# Patient Record
Sex: Female | Born: 1935 | Race: White | Hispanic: No | Smoking: Never smoker
Health system: Southern US, Community
[De-identification: ages and names within clinical notes are randomized; demographics above are authoritative.]

## PROBLEM LIST (undated history)

## (undated) DIAGNOSIS — I10 Essential (primary) hypertension: Secondary | ICD-10-CM

## (undated) DIAGNOSIS — K219 Gastro-esophageal reflux disease without esophagitis: Secondary | ICD-10-CM

## (undated) DIAGNOSIS — E119 Type 2 diabetes mellitus without complications: Secondary | ICD-10-CM

## (undated) DIAGNOSIS — J309 Allergic rhinitis, unspecified: Secondary | ICD-10-CM

## (undated) DIAGNOSIS — G4730 Sleep apnea, unspecified: Secondary | ICD-10-CM

## (undated) HISTORY — PX: FIXATION KYPHOPLASTY LUMBAR SPINE: SHX1642

## (undated) HISTORY — PX: TUBAL LIGATION: SHX77

## (undated) HISTORY — PX: HIATAL HERNIA REPAIR: SHX195

## (undated) HISTORY — DX: Gastro-esophageal reflux disease without esophagitis: K21.9

## (undated) HISTORY — PX: VAGINAL HYSTERECTOMY: SHX2639

## (undated) HISTORY — DX: Allergic rhinitis, unspecified: J30.9

## (undated) HISTORY — DX: Type 2 diabetes mellitus without complications: E11.9

## (undated) HISTORY — DX: Sleep apnea, unspecified: G47.30

## (undated) HISTORY — DX: Essential (primary) hypertension: I10

## (undated) HISTORY — PX: LUMBAR SPINE SURGERY: SHX701

## (undated) HISTORY — PX: APPENDECTOMY: SHX54

---

## 1998-07-05 ENCOUNTER — Ambulatory Visit (HOSPITAL_COMMUNITY): Admission: RE | Admit: 1998-07-05 | Discharge: 1998-07-05 | Payer: Self-pay | Admitting: Internal Medicine

## 1998-07-05 ENCOUNTER — Encounter: Payer: Self-pay | Admitting: Internal Medicine

## 1998-09-14 ENCOUNTER — Ambulatory Visit (HOSPITAL_COMMUNITY): Admission: RE | Admit: 1998-09-14 | Discharge: 1998-09-14 | Payer: Self-pay | Admitting: Gastroenterology

## 1998-12-12 ENCOUNTER — Other Ambulatory Visit: Admission: RE | Admit: 1998-12-12 | Discharge: 1998-12-12 | Payer: Self-pay | Admitting: *Deleted

## 1999-03-16 ENCOUNTER — Encounter: Payer: Self-pay | Admitting: Family Medicine

## 1999-03-16 ENCOUNTER — Encounter: Admission: RE | Admit: 1999-03-16 | Discharge: 1999-03-16 | Payer: Self-pay | Admitting: Family Medicine

## 1999-04-04 ENCOUNTER — Encounter: Admission: RE | Admit: 1999-04-04 | Discharge: 1999-04-04 | Payer: Self-pay | Admitting: Neurosurgery

## 1999-04-04 ENCOUNTER — Encounter: Payer: Self-pay | Admitting: Neurosurgery

## 1999-04-10 ENCOUNTER — Encounter: Admission: RE | Admit: 1999-04-10 | Discharge: 1999-04-10 | Payer: Self-pay | Admitting: Neurosurgery

## 1999-04-10 ENCOUNTER — Encounter: Payer: Self-pay | Admitting: Neurosurgery

## 1999-04-21 ENCOUNTER — Encounter: Payer: Self-pay | Admitting: Neurosurgery

## 1999-04-25 ENCOUNTER — Encounter: Payer: Self-pay | Admitting: Neurosurgery

## 1999-04-25 ENCOUNTER — Inpatient Hospital Stay (HOSPITAL_COMMUNITY): Admission: RE | Admit: 1999-04-25 | Discharge: 1999-04-29 | Payer: Self-pay | Admitting: Neurosurgery

## 1999-05-07 ENCOUNTER — Emergency Department (HOSPITAL_COMMUNITY): Admission: EM | Admit: 1999-05-07 | Discharge: 1999-05-07 | Payer: Self-pay | Admitting: Emergency Medicine

## 1999-05-17 ENCOUNTER — Encounter: Admission: RE | Admit: 1999-05-17 | Discharge: 1999-05-17 | Payer: Self-pay | Admitting: Neurosurgery

## 1999-05-17 ENCOUNTER — Encounter: Payer: Self-pay | Admitting: Neurosurgery

## 1999-07-31 ENCOUNTER — Encounter: Payer: Self-pay | Admitting: Neurosurgery

## 1999-07-31 ENCOUNTER — Encounter: Admission: RE | Admit: 1999-07-31 | Discharge: 1999-07-31 | Payer: Self-pay | Admitting: Neurosurgery

## 1999-11-07 ENCOUNTER — Encounter: Payer: Self-pay | Admitting: *Deleted

## 1999-11-07 ENCOUNTER — Encounter: Admission: RE | Admit: 1999-11-07 | Discharge: 1999-11-07 | Payer: Self-pay | Admitting: *Deleted

## 1999-11-07 ENCOUNTER — Other Ambulatory Visit: Admission: RE | Admit: 1999-11-07 | Discharge: 1999-11-07 | Payer: Self-pay | Admitting: *Deleted

## 2000-02-29 ENCOUNTER — Encounter: Payer: Self-pay | Admitting: Neurosurgery

## 2000-02-29 ENCOUNTER — Encounter: Admission: RE | Admit: 2000-02-29 | Discharge: 2000-02-29 | Payer: Self-pay | Admitting: Neurosurgery

## 2000-04-15 ENCOUNTER — Encounter: Admission: RE | Admit: 2000-04-15 | Discharge: 2000-04-15 | Payer: Self-pay | Admitting: *Deleted

## 2000-04-15 ENCOUNTER — Encounter: Payer: Self-pay | Admitting: *Deleted

## 2000-10-24 ENCOUNTER — Encounter: Admission: RE | Admit: 2000-10-24 | Discharge: 2000-10-24 | Payer: Self-pay | Admitting: *Deleted

## 2000-10-24 ENCOUNTER — Encounter: Payer: Self-pay | Admitting: *Deleted

## 2001-02-19 ENCOUNTER — Encounter: Admission: RE | Admit: 2001-02-19 | Discharge: 2001-05-20 | Payer: Self-pay | Admitting: Family Medicine

## 2001-08-27 ENCOUNTER — Encounter: Admission: RE | Admit: 2001-08-27 | Discharge: 2001-08-27 | Payer: Self-pay | Admitting: Family Medicine

## 2001-08-27 ENCOUNTER — Encounter: Payer: Self-pay | Admitting: Family Medicine

## 2001-10-23 ENCOUNTER — Ambulatory Visit (HOSPITAL_BASED_OUTPATIENT_CLINIC_OR_DEPARTMENT_OTHER): Admission: RE | Admit: 2001-10-23 | Discharge: 2001-10-23 | Payer: Self-pay | Admitting: Orthopedic Surgery

## 2001-12-17 ENCOUNTER — Encounter: Admission: RE | Admit: 2001-12-17 | Discharge: 2001-12-17 | Payer: Self-pay | Admitting: *Deleted

## 2001-12-17 ENCOUNTER — Encounter: Payer: Self-pay | Admitting: *Deleted

## 2002-09-04 ENCOUNTER — Encounter (INDEPENDENT_AMBULATORY_CARE_PROVIDER_SITE_OTHER): Payer: Self-pay | Admitting: *Deleted

## 2002-09-04 ENCOUNTER — Ambulatory Visit (HOSPITAL_COMMUNITY): Admission: RE | Admit: 2002-09-04 | Discharge: 2002-09-04 | Payer: Self-pay | Admitting: Gastroenterology

## 2003-01-22 ENCOUNTER — Encounter: Admission: RE | Admit: 2003-01-22 | Discharge: 2003-01-22 | Payer: Self-pay | Admitting: *Deleted

## 2003-07-18 ENCOUNTER — Emergency Department (HOSPITAL_COMMUNITY): Admission: EM | Admit: 2003-07-18 | Discharge: 2003-07-18 | Payer: Self-pay

## 2003-08-17 ENCOUNTER — Encounter: Admission: RE | Admit: 2003-08-17 | Discharge: 2003-08-17 | Payer: Self-pay | Admitting: Orthopedic Surgery

## 2003-08-18 ENCOUNTER — Ambulatory Visit (HOSPITAL_BASED_OUTPATIENT_CLINIC_OR_DEPARTMENT_OTHER): Admission: RE | Admit: 2003-08-18 | Discharge: 2003-08-18 | Payer: Self-pay | Admitting: Orthopedic Surgery

## 2003-08-18 ENCOUNTER — Ambulatory Visit (HOSPITAL_COMMUNITY): Admission: RE | Admit: 2003-08-18 | Discharge: 2003-08-18 | Payer: Self-pay | Admitting: Orthopedic Surgery

## 2004-02-04 ENCOUNTER — Encounter: Admission: RE | Admit: 2004-02-04 | Discharge: 2004-02-04 | Payer: Self-pay | Admitting: Family Medicine

## 2004-05-05 ENCOUNTER — Ambulatory Visit: Payer: Self-pay | Admitting: Internal Medicine

## 2004-06-13 ENCOUNTER — Ambulatory Visit: Payer: Self-pay | Admitting: Internal Medicine

## 2004-07-05 ENCOUNTER — Ambulatory Visit: Payer: Self-pay | Admitting: Internal Medicine

## 2004-08-09 ENCOUNTER — Ambulatory Visit: Payer: Self-pay | Admitting: Internal Medicine

## 2004-08-10 ENCOUNTER — Ambulatory Visit: Payer: Self-pay | Admitting: Internal Medicine

## 2004-09-13 ENCOUNTER — Ambulatory Visit: Payer: Self-pay | Admitting: Internal Medicine

## 2004-10-17 ENCOUNTER — Ambulatory Visit: Payer: Self-pay | Admitting: Internal Medicine

## 2004-11-10 ENCOUNTER — Ambulatory Visit: Payer: Self-pay | Admitting: Internal Medicine

## 2004-12-07 ENCOUNTER — Ambulatory Visit: Payer: Self-pay | Admitting: Internal Medicine

## 2004-12-08 ENCOUNTER — Ambulatory Visit: Payer: Self-pay | Admitting: Internal Medicine

## 2005-04-04 ENCOUNTER — Ambulatory Visit: Payer: Self-pay | Admitting: Internal Medicine

## 2005-04-05 ENCOUNTER — Ambulatory Visit: Payer: Self-pay | Admitting: Internal Medicine

## 2005-05-25 ENCOUNTER — Encounter: Admission: RE | Admit: 2005-05-25 | Discharge: 2005-05-25 | Payer: Self-pay | Admitting: Family Medicine

## 2005-07-03 ENCOUNTER — Ambulatory Visit: Payer: Self-pay | Admitting: Internal Medicine

## 2005-09-25 ENCOUNTER — Ambulatory Visit: Payer: Self-pay | Admitting: Internal Medicine

## 2005-10-03 ENCOUNTER — Ambulatory Visit: Payer: Self-pay | Admitting: Internal Medicine

## 2006-01-01 ENCOUNTER — Ambulatory Visit: Payer: Self-pay | Admitting: Internal Medicine

## 2006-04-01 ENCOUNTER — Ambulatory Visit: Payer: Self-pay | Admitting: Internal Medicine

## 2006-04-16 ENCOUNTER — Ambulatory Visit: Payer: Self-pay | Admitting: Internal Medicine

## 2006-06-04 ENCOUNTER — Encounter: Admission: RE | Admit: 2006-06-04 | Discharge: 2006-06-04 | Payer: Self-pay | Admitting: Family Medicine

## 2006-06-23 ENCOUNTER — Encounter: Admission: RE | Admit: 2006-06-23 | Discharge: 2006-06-23 | Payer: Self-pay | Admitting: Family Medicine

## 2006-07-25 ENCOUNTER — Ambulatory Visit: Payer: Self-pay | Admitting: Internal Medicine

## 2006-09-05 ENCOUNTER — Encounter: Admission: RE | Admit: 2006-09-05 | Discharge: 2006-09-05 | Payer: Self-pay | Admitting: Neurosurgery

## 2006-09-30 ENCOUNTER — Ambulatory Visit: Payer: Self-pay | Admitting: Internal Medicine

## 2006-10-08 ENCOUNTER — Encounter: Payer: Self-pay | Admitting: Internal Medicine

## 2006-10-16 ENCOUNTER — Ambulatory Visit: Payer: Self-pay | Admitting: Internal Medicine

## 2006-10-16 ENCOUNTER — Encounter (INDEPENDENT_AMBULATORY_CARE_PROVIDER_SITE_OTHER): Payer: Self-pay | Admitting: General Surgery

## 2006-10-16 ENCOUNTER — Inpatient Hospital Stay (HOSPITAL_COMMUNITY): Admission: RE | Admit: 2006-10-16 | Discharge: 2006-10-18 | Payer: Self-pay | Admitting: Neurosurgery

## 2006-10-17 ENCOUNTER — Ambulatory Visit: Payer: Self-pay | Admitting: Vascular Surgery

## 2007-02-04 ENCOUNTER — Ambulatory Visit: Payer: Self-pay | Admitting: Internal Medicine

## 2007-03-10 ENCOUNTER — Ambulatory Visit: Payer: Self-pay | Admitting: Surgery

## 2007-03-31 ENCOUNTER — Ambulatory Visit: Payer: Self-pay | Admitting: Internal Medicine

## 2007-03-31 DIAGNOSIS — J449 Chronic obstructive pulmonary disease, unspecified: Secondary | ICD-10-CM

## 2007-03-31 DIAGNOSIS — I872 Venous insufficiency (chronic) (peripheral): Secondary | ICD-10-CM

## 2007-03-31 DIAGNOSIS — E119 Type 2 diabetes mellitus without complications: Secondary | ICD-10-CM | POA: Insufficient documentation

## 2007-03-31 DIAGNOSIS — I10 Essential (primary) hypertension: Secondary | ICD-10-CM

## 2007-03-31 DIAGNOSIS — E1169 Type 2 diabetes mellitus with other specified complication: Secondary | ICD-10-CM | POA: Insufficient documentation

## 2007-03-31 DIAGNOSIS — J302 Other seasonal allergic rhinitis: Secondary | ICD-10-CM | POA: Insufficient documentation

## 2007-03-31 DIAGNOSIS — K219 Gastro-esophageal reflux disease without esophagitis: Secondary | ICD-10-CM | POA: Insufficient documentation

## 2007-03-31 DIAGNOSIS — E669 Obesity, unspecified: Secondary | ICD-10-CM | POA: Insufficient documentation

## 2007-03-31 DIAGNOSIS — J4489 Other specified chronic obstructive pulmonary disease: Secondary | ICD-10-CM | POA: Insufficient documentation

## 2007-03-31 DIAGNOSIS — J441 Chronic obstructive pulmonary disease with (acute) exacerbation: Secondary | ICD-10-CM | POA: Insufficient documentation

## 2007-03-31 DIAGNOSIS — J3089 Other allergic rhinitis: Secondary | ICD-10-CM

## 2007-03-31 DIAGNOSIS — J4530 Mild persistent asthma, uncomplicated: Secondary | ICD-10-CM

## 2007-04-21 ENCOUNTER — Ambulatory Visit: Payer: Self-pay | Admitting: Surgery

## 2007-04-23 ENCOUNTER — Ambulatory Visit: Payer: Self-pay | Admitting: Vascular Surgery

## 2007-04-25 ENCOUNTER — Ambulatory Visit: Payer: Self-pay | Admitting: Oncology

## 2007-06-03 ENCOUNTER — Ambulatory Visit: Payer: Self-pay | Admitting: Internal Medicine

## 2007-06-03 ENCOUNTER — Ambulatory Visit: Payer: Self-pay | Admitting: Vascular Surgery

## 2007-06-10 ENCOUNTER — Encounter: Admission: RE | Admit: 2007-06-10 | Discharge: 2007-06-10 | Payer: Self-pay | Admitting: Family Medicine

## 2007-07-02 ENCOUNTER — Ambulatory Visit: Payer: Self-pay | Admitting: Oncology

## 2007-07-02 LAB — CBC WITH DIFFERENTIAL/PLATELET
Basophils Absolute: 0 10*3/uL (ref 0.0–0.1)
Eosinophils Absolute: 0.1 10*3/uL (ref 0.0–0.5)
HCT: 36.8 % (ref 34.8–46.6)
HGB: 12.6 g/dL (ref 11.6–15.9)
LYMPH%: 38.4 % (ref 14.0–48.0)
MCH: 30.2 pg (ref 26.0–34.0)
MCV: 88.7 fL (ref 81.0–101.0)
MONO%: 6.3 % (ref 0.0–13.0)
NEUT#: 3.8 10*3/uL (ref 1.5–6.5)
NEUT%: 53.7 % (ref 39.6–76.8)
Platelets: 208 10*3/uL (ref 145–400)
RDW: 14 % (ref 11.3–14.5)

## 2007-07-02 LAB — COMPREHENSIVE METABOLIC PANEL
ALT: 12 U/L (ref 0–35)
AST: 16 U/L (ref 0–37)
Albumin: 4.4 g/dL (ref 3.5–5.2)
Alkaline Phosphatase: 54 U/L (ref 39–117)
BUN: 65 mg/dL — ABNORMAL HIGH (ref 6–23)
Potassium: 4.9 mEq/L (ref 3.5–5.3)

## 2007-07-02 LAB — MORPHOLOGY

## 2007-07-02 LAB — PROTIME-INR
INR: 1 — ABNORMAL LOW (ref 2.00–3.50)
Protime: 12 Seconds (ref 10.6–13.4)

## 2007-07-21 ENCOUNTER — Ambulatory Visit: Payer: Self-pay | Admitting: Vascular Surgery

## 2007-07-29 ENCOUNTER — Ambulatory Visit: Payer: Self-pay | Admitting: Vascular Surgery

## 2007-08-13 ENCOUNTER — Ambulatory Visit: Payer: Self-pay | Admitting: Oncology

## 2007-08-15 LAB — PROTIME-INR
INR: 1.9 — ABNORMAL LOW (ref 2.00–3.50)
Protime: 22.8 Seconds — ABNORMAL HIGH (ref 10.6–13.4)

## 2007-08-23 ENCOUNTER — Encounter: Payer: Self-pay | Admitting: Pulmonary Disease

## 2007-08-27 ENCOUNTER — Encounter: Payer: Self-pay | Admitting: Internal Medicine

## 2007-09-23 ENCOUNTER — Ambulatory Visit: Payer: Self-pay | Admitting: Internal Medicine

## 2007-09-23 LAB — PROTIME-INR

## 2007-09-24 ENCOUNTER — Ambulatory Visit: Payer: Self-pay | Admitting: Oncology

## 2007-09-29 ENCOUNTER — Ambulatory Visit: Payer: Self-pay | Admitting: Internal Medicine

## 2007-09-29 DIAGNOSIS — N259 Disorder resulting from impaired renal tubular function, unspecified: Secondary | ICD-10-CM | POA: Insufficient documentation

## 2007-09-29 LAB — PROTIME-INR: Protime: 36 Seconds — ABNORMAL HIGH (ref 10.6–13.4)

## 2007-09-30 ENCOUNTER — Telehealth: Payer: Self-pay | Admitting: Internal Medicine

## 2007-10-14 LAB — PROTIME-INR
INR: 2.6 (ref 2.00–3.50)
Protime: 31.2 Seconds — ABNORMAL HIGH (ref 10.6–13.4)

## 2007-11-19 ENCOUNTER — Ambulatory Visit: Payer: Self-pay | Admitting: Oncology

## 2007-11-24 ENCOUNTER — Encounter: Payer: Self-pay | Admitting: Internal Medicine

## 2007-11-24 LAB — CBC WITH DIFFERENTIAL/PLATELET
Basophils Absolute: 0 10*3/uL (ref 0.0–0.1)
Eosinophils Absolute: 0.1 10*3/uL (ref 0.0–0.5)
HGB: 13.6 g/dL (ref 11.6–15.9)
LYMPH%: 26.5 % (ref 14.0–48.0)
MCV: 87.1 fL (ref 81.0–101.0)
MONO#: 0.5 10*3/uL (ref 0.1–0.9)
NEUT#: 5.7 10*3/uL (ref 1.5–6.5)
Platelets: 205 10*3/uL (ref 145–400)
RBC: 4.65 10*6/uL (ref 3.70–5.32)
RDW: 13.4 % (ref 11.3–14.5)
WBC: 8.6 10*3/uL (ref 3.9–10.0)

## 2007-11-24 LAB — PROTIME-INR
INR: 3.1 (ref 2.00–3.50)
Protime: 37.2 Seconds — ABNORMAL HIGH (ref 10.6–13.4)

## 2007-12-30 ENCOUNTER — Ambulatory Visit: Payer: Self-pay | Admitting: Internal Medicine

## 2008-01-02 ENCOUNTER — Ambulatory Visit: Payer: Self-pay | Admitting: Internal Medicine

## 2008-04-20 ENCOUNTER — Ambulatory Visit: Payer: Self-pay | Admitting: Internal Medicine

## 2008-06-11 ENCOUNTER — Encounter: Admission: RE | Admit: 2008-06-11 | Discharge: 2008-06-11 | Payer: Self-pay | Admitting: Internal Medicine

## 2008-06-28 ENCOUNTER — Ambulatory Visit: Payer: Self-pay | Admitting: Internal Medicine

## 2008-06-29 ENCOUNTER — Encounter: Payer: Self-pay | Admitting: Internal Medicine

## 2008-08-16 ENCOUNTER — Ambulatory Visit: Payer: Self-pay | Admitting: Internal Medicine

## 2008-11-17 ENCOUNTER — Telehealth: Payer: Self-pay | Admitting: Internal Medicine

## 2008-11-17 ENCOUNTER — Encounter: Payer: Self-pay | Admitting: Internal Medicine

## 2008-12-07 ENCOUNTER — Ambulatory Visit: Payer: Self-pay | Admitting: Internal Medicine

## 2008-12-27 ENCOUNTER — Ambulatory Visit: Payer: Self-pay | Admitting: Internal Medicine

## 2009-01-12 ENCOUNTER — Telehealth: Payer: Self-pay | Admitting: Internal Medicine

## 2009-04-04 ENCOUNTER — Encounter: Admission: RE | Admit: 2009-04-04 | Discharge: 2009-04-04 | Payer: Self-pay | Admitting: Internal Medicine

## 2009-06-09 ENCOUNTER — Encounter: Admission: RE | Admit: 2009-06-09 | Discharge: 2009-06-09 | Payer: Self-pay | Admitting: Neurosurgery

## 2009-06-15 ENCOUNTER — Encounter: Admission: RE | Admit: 2009-06-15 | Discharge: 2009-06-15 | Payer: Self-pay | Admitting: Internal Medicine

## 2009-06-27 ENCOUNTER — Ambulatory Visit: Payer: Self-pay | Admitting: Internal Medicine

## 2009-07-11 ENCOUNTER — Ambulatory Visit: Payer: Self-pay | Admitting: Internal Medicine

## 2009-07-12 ENCOUNTER — Telehealth (INDEPENDENT_AMBULATORY_CARE_PROVIDER_SITE_OTHER): Payer: Self-pay | Admitting: *Deleted

## 2009-07-19 ENCOUNTER — Inpatient Hospital Stay (HOSPITAL_COMMUNITY): Admission: RE | Admit: 2009-07-19 | Discharge: 2009-07-26 | Payer: Self-pay | Admitting: Neurosurgery

## 2009-09-01 ENCOUNTER — Encounter: Admission: RE | Admit: 2009-09-01 | Discharge: 2009-09-01 | Payer: Self-pay | Admitting: Internal Medicine

## 2009-11-22 ENCOUNTER — Ambulatory Visit: Payer: Self-pay | Admitting: Internal Medicine

## 2009-12-27 ENCOUNTER — Ambulatory Visit: Payer: Self-pay | Admitting: Internal Medicine

## 2010-03-14 ENCOUNTER — Ambulatory Visit
Admission: RE | Admit: 2010-03-14 | Discharge: 2010-03-14 | Payer: Self-pay | Source: Home / Self Care | Attending: Internal Medicine | Admitting: Internal Medicine

## 2010-03-21 ENCOUNTER — Telehealth (INDEPENDENT_AMBULATORY_CARE_PROVIDER_SITE_OTHER): Payer: Self-pay | Admitting: *Deleted

## 2010-03-31 ENCOUNTER — Ambulatory Visit: Payer: Self-pay | Admitting: Internal Medicine

## 2010-04-03 ENCOUNTER — Encounter: Payer: Self-pay | Admitting: Internal Medicine

## 2010-04-13 NOTE — Progress Notes (Signed)
Summary: short stay requests ov notes asap  Phone Note From Other Clinic   Caller: michele at [facility] Call For: young Summary of Call: needs last ov notes (06/27/09) faxed to: [phone] (pt is due for surgery). contact # for michele is: [phone] Initial call taken by: Cooper Render, CNA,  Jul 12, 2009 11:26 AM  Follow-up for Phone Call        Faxed note./Juanita Follow-up by: Netta Neat,  Jul 12, 2009 1:13 PM

## 2010-04-13 NOTE — Progress Notes (Signed)
Summary: albuterol neb - file under part b  Phone Note From Pharmacy   Caller: [place]. [phone]* Summary of Call: Received a prior auth form [facility] for pt Albuterol neb med, but this was filed under part D, and needed to be filed under part B. I called [facility] and advised to file under Part B and pharmacists said they need a new prescription with the diagnosis code  written on it in the doctors handwriting.  I have printed the rx and had TP sign it. I have faxed rx to [facility]  [phone].  Initial call taken by: [place] Bing CMA,  March 21, 2010 11:48 AM  Follow-up for Phone Call        rx signed by TP and faxed to [facility]. Parke Poisson CNA/MA  March 24, 2010 10:18 AM   [facility] to check on rx for pt and they states they never received the RX fax, so I re-faxed it to [phone], pharmacists is aware. [place] Bing CMA  March 27, 2010 9:52 AM   Additional Follow-up for Phone Call Additional follow up Details #1::        Called [facility] to check on the status of this.  Spoke with Denman George, Pharmacist.  States they did file this medication under Part B and is now being approved.    Additional Follow-up by: Raymondo Band RN,  March 28, 2010 3:33 PM    Prescriptions: ALBUTEROL SULFATE (2.5 MG/3ML) 0.083% NEBU (ALBUTEROL SULFATE) 1 four times a day as needed  #45 x 4   Entered by:   [place] Bing CMA   Authorized by:   Rexene Edison NP   Signed by:    Bing CMA on 03/21/2010   Method used:   Printed then faxed to ...       [id number] (retail)       [address] [place], [place]  [address]       Ph: [phone]       Fax: [phone]   RxID:   [id number]

## 2010-04-13 NOTE — Assessment & Plan Note (Signed)
Summary: Acute NP office visit - bronchitis   Primary Provider/Referring Provider:  D. Shaw  CC:  left ear discomfort, sore throat, prod cough with green mucus, hoarseness, wheezing, and DOE x4days - denies f/c/s.  History of Present Illness: December 27, 2008- Asthma/ bronchitis, allergic rhinitis, GERD, hx DVT Difficult summer, stayed in saying she couldn't breathe due to the heat. Better as Fall weather came in.  We discussed her allergy vaccine- needs epipen. Need to clarify her dosing with allergy lab.  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 She has been on chronic coumadin adjusted recently with lovenox for MRI and now says plan is to place an IVC filter to take her off comadin before surgery. She made it through winter without respiratory problems and continues to breathe well. Denies chest pain, palpitation, sneeze, itch, fever, nodes or blood. She continues her regular meds as reviewed, including allergy vaccine. She recognizes May tends to be her worst month.  December 27, 2009- Asthmatic bronchitis, allergic rhinitis, GERD, hx DVT Nurse CC: 6 month follow up visit-asthma. Pressure above eyes; ? sinus trouble.  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 Says sudafed cleared frontal headache . Needs refill singulair and Epipen. Denies chest discomfort or wheeze- chest feels good. Doesn't want to try Neti pot. Still blowing some from nose.   March 14, 2010 --Presents for an acute office visit. Complains of left ear discomfort, sore throat, prod cough with green mucus, hoarseness, wheezing, DOE x4days. OTC not helping Denies chest pain, orthopnea, hemoptysis, fever, n/v/d, edema, headache.     Medications Prior to Update: 1)  Singulair 10 Mg  Tabs (Montelukast Sodium) .... Take 1 Tablet By Mouth  Once A Day 2)  Amlodipine Besylate 10 Mg  Tabs (Amlodipine Besylate) .... Take 1 Tablet By Mouth Once A Day 3)  Benicar Hct 40-25 Mg  Tabs (Olmesartan Medoxomil-Hctz) .... Take 1 Tablet By Mouth Once A Day 4)  Prilosec 20 Mg  Cpdr (Omeprazole) .... Take 1 Tablet By Mouth Once A Day 5)  Preservision/lutein   Caps (Multiple Vitamins-Minerals) 6)  Oscal 500/200 D-3 500-200 Mg-Unit  Tabs (Calcium-Vitamin D) .... Take 2 Tablet By Mouth Once A Day 7)  Ventolin Hfa 108 (90 Base) Mcg/act  Aers (Albuterol Sulfate) .... As Needed 8)  Pulmicort Flexhaler 180 Mcg/act  Inha (Budesonide) .Marland Kitchen.. 1-2 Puffs Two Times A Day 9)  Claritin 10 Mg Tabs (Loratadine) .... Take 1 By Mouth Once Daily 10)  Vitamin D-1000 Max St [phone] Mg-Unit  Tabs (Calcium-Vitamin D) .... Take 3 Per Day 11)  Vitamin B-12 1000 Mcg  Tabs (Cyanocobalamin) 12)  Allergy Vaccine 1:10 Go (W-E) 13)  Epipen 0.3 Mg/0.1ml Devi (Epinephrine) .... For Severe Allergic Reaction 14)  Amaryl 4 Mg  Tabs (Glimepiride) .... Take 1 By Mouth Once Daily 15)  Coumadin 6 Mg  Tabs (Warfarin Sodium) .... Take 9mg  Once Daily Except Thursdays-Take 12mg  16)  L-Lysine Hcl 500 Mg Caps (Lysine Hcl) .... 2 Caps Once Daily 17)  Nebulizer Machine For Aerosol Meds 18)  Albuterol Sulfate (2.5 Mg/74ml) 0.083% Nebu (Albuterol Sulfate) .Marland Kitchen.. 1 Four Times A Day As Needed 19)  Januvia 50 Mg Tabs (Sitagliptin Phosphate) .... Take 1 By Mouth Once Daily 20)  Furosemide 40 Mg Tabs (Furosemide) .... Take 1 By Mouth Once Daily As Needed 21)  Vytorin 10-40  Mg Tabs (Ezetimibe-Simvastatin) .... Take 1 By Mouth Once Daily 22)  Metamucil 0.52 Gm Caps (Psyllium) .... Take 2 By Mouth Once Daily  Current Medications (verified): 1)  Singulair 10 Mg  Tabs (Montelukast Sodium) .... Take 1 Tablet By Mouth Once A Day 2)  Amlodipine Besylate 10 Mg  Tabs (Amlodipine Besylate) .... Take 1 Tablet By Mouth Once A Day 3)  Benicar Hct 40-25 Mg  Tabs (Olmesartan Medoxomil-Hctz) .... Take 1 Tablet By  Mouth Once A Day 4)  Prilosec 20 Mg  Cpdr (Omeprazole) .... Take 1 Tablet By Mouth Once A Day 5)  Preservision/lutein   Caps (Multiple Vitamins-Minerals) .... Take 1 Capsule By Mouth Two Times A Day 6)  Oscal 500/200 D-3 500-200 Mg-Unit  Tabs (Calcium-Vitamin D) .... Take 2 Tablet By Mouth Once A Day 7)  Ventolin Hfa 108 (90 Base) Mcg/act  Aers (Albuterol Sulfate) .... As Needed 8)  Pulmicort Flexhaler 180 Mcg/act  Inha (Budesonide) .Marland Kitchen.. 1-2 Puffs Two Times A Day 9)  Zyrtec Allergy 10 Mg Tabs (Cetirizine Hcl) .... Take 1 Tablet By Mouth Once A Day 10)  Vitamin D-1000 Max St [phone] Mg-Unit  Tabs (Calcium-Vitamin D) .... Take 3 Per Day 11)  Vitamin B-12 1000 Mcg  Tabs (Cyanocobalamin) .... Take 1 Tablet By Mouth Once A Day 12)  Allergy Vaccine 1:10 Go (W-E) .... Every Friday 13)  Epipen 0.3 Mg/0.59ml Devi (Epinephrine) .... For Severe Allergic Reaction 14)  Amaryl 4 Mg  Tabs (Glimepiride) .... Take 1 By Mouth Once Daily 15)  Coumadin 6 Mg  Tabs (Warfarin Sodium) .... Take 9mg  Once Daily Except Thursdays-Take 12mg  16)  L-Lysine Hcl 500 Mg Caps (Lysine Hcl) .... 2 Caps Once Daily 17)  Nebulizer Machine For Aerosol Meds 18)  Albuterol Sulfate (2.5 Mg/80ml) 0.083% Nebu (Albuterol Sulfate) .Marland Kitchen.. 1 Four Times A Day As Needed 19)  Januvia 50 Mg Tabs (Sitagliptin Phosphate) .... Take 1 By Mouth Once Daily 20)  Furosemide 40 Mg Tabs (Furosemide) .... Take 1 By Mouth Once Daily As Needed 21)  Vytorin 10-40 Mg Tabs (Ezetimibe-Simvastatin) .... Take 1 By Mouth Once Daily 22)  Metamucil 0.52 Gm Caps (Psyllium) .... Take 2 By Mouth Once Daily  Allergies (verified): 1)  ! Demerol 2)  ! * Asprin 3)  ! Ibuprofen 4)  ! Codeine 5)  ! * Z Pak Azithromycin  Past History:  Past Medical History: Last updated: 12/30/2007 Allergic Rhinitis Asthma Deep Vein Thrombosis/Phlebitis - after lumbar spine surgery, coumadin Diabetes, Type 2 G E R D Hypertension  Past Surgical History: Last updated:  12/30/2007 hysterectomy tubal ligation Appendectomy Knee Hiatal hernia surgery x 2 Lumbar spine surgery - displaced by lipoma large lipoma resected low back  Family History: Last updated: 13-Apr-2007 Mother died DM, heart Father died 19 brother died lung ca, smoker dtr with Crohn's disease  Social History: Last updated: 12/30/2007 Patient never smoked.  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 Yes Comments Medications reviewed with patient Daytime contact number verified with patient. Parke Poisson CNA/MA  March 14, 2010 12:20 PM    Physical Exam  Additional Exam:  General: A/Ox3; pleasant and cooperative, NAD, overweight SKIN: no rash, lesions NODES: no lymphadenopathy HEENT: [place]/AT, EOM- WNL, Conjuctivae- clear, PERRLA, TM-WNL, Nose- clear drainage,max tenderness Throat- hoarse, dentures, Mallampati II NECK: Supple w/ fair ROM, JVD- none, normal carotid impulses w/o bruits Thyroid- normal to palpation CHEST:, unlabored, no cough, rub or rales.RA sat%96 HEART: RRR, no m/g/r heard ABDOMEN: softly obese [id number], nl pulses, Heavy legs with marked v.varices, no edema NEURO: Grossly intact to observation, tremor chin      Impression & Recommendations:  Problem # 1:  ASTHMATIC BRONCHITIS, ACUTE (ICD-466.0)  w/ associated sinusitis Plan :  Augmentin 875mg  two times a day for 10 days w/ food  Eat yogurt two times a  day while on antibiotic.  Increase fluids.  Saline nasal rinses as needed  Prednisone taper over next week.  Tussionex 1 tsp two times a day as needed cough, may make you sleepy.  Please contact office for sooner follow up if symptoms do not improve or worsen  Her updated medication list for this problem includes:    Singulair 10 Mg Tabs (Montelukast sodium) .Marland Kitchen... Take 1 tablet by mouth once a day    Ventolin Hfa 108 (90 Base) Mcg/act Aers (Albuterol sulfate) .Marland Kitchen... As needed    Pulmicort Flexhaler 180 Mcg/act Inha (Budesonide) .Marland Kitchen... 1-2 puffs two times a day    Albuterol Sulfate (2.5 Mg/74ml) 0.083% Nebu (Albuterol sulfate) .Marland Kitchen... 1 four times a day as needed    Augmentin 875-125 Mg Tabs (Amoxicillin-pot clavulanate) .Marland Kitchen... 1 by mouth two times a day    Tussionex Pennkinetic Er 10-8 Mg/24ml Lqcr (Hydrocod polst-chlorphen polst) .Marland Kitchen... 1 tsp two times a day as needed cough , may make you sleepy.  Orders: Est. Patient Level IV [id number]  Medications Added to Medication List This Visit: 1)  Preservision/lutein Caps (Multiple vitamins-minerals) .... Take 1 capsule by mouth two times a day 2)  Zyrtec Allergy 10 Mg Tabs (Cetirizine hcl) .... Take 1 tablet by mouth once a day 3)  Vitamin B-12 1000 Mcg Tabs (Cyanocobalamin) .... Take 1 tablet by mouth once a day 4)  Allergy Vaccine 1:10 Go (w-e)  .... Every friday 5)  Augmentin 875-125 Mg Tabs (Amoxicillin-pot clavulanate) .Marland Kitchen.. 1 by mouth two times a day 6)  Prednisone 10 Mg Tabs (Prednisone) .... 4 tabs for 2 days, then 3 tabs for 2 days, 2 tabs for 2 days, then 1 tab for 2 days, then stop 7)  Tussionex Pennkinetic Er 10-8 Mg/44ml Lqcr (Hydrocod polst-chlorphen polst) .Marland Kitchen.. 1 tsp two times a day as needed cough , may make you sleepy.  Patient Instructions: 1)  Augmentin 875mg  two times a day for 10 days w/ food  2)  Eat yogurt two times a day while on antibiotic.  3)  Increase fluids.  4)  Saline nasal rinses as needed  5)  Prednisone taper over next  week.  6)  Tussionex 1 tsp two times a day as needed cough, may make you sleepy.  7)  Please contact office for sooner follow up if symptoms do not improve or worsen  Prescriptions: ALBUTEROL SULFATE (2.5 MG/3ML) 0.083%  NEBU (ALBUTEROL SULFATE) 1 four times a day as needed  #45 x 4   Entered and Authorized by:   Rexene Edison NP   Signed by:   Rexene Edison NP on 03/14/2010   Method used:   Electronically to        [facility] (retail)       [place], [place]  [address]       Ph: [phone]       Fax: [phone]   RxID:   [id number] Cathie Hoops ER 10-8 MG/5ML LQCR (HYDROCOD POLST-CHLORPHEN POLST) 1 tsp two times a day as needed cough , may make you sleepy.  #4 oz x 0   Entered and Authorized by:   Rexene Edison NP   Signed by:   Gracianna Vink NP on 03/14/2010   Method used:   Print then Give to Patient   RxID:   [id number] PREDNISONE 10 MG TABS (PREDNISONE) 4 tabs for 2 days, then 3 tabs for 2 days, 2 tabs for 2 days, then 1 tab for 2 days, then stop  #20 x 0   Entered and Authorized by:   Rexene Edison NP   Signed by:   Rexene Edison NP on 03/14/2010   Method used:   Electronically to        [facility] (retail)       [place], [place]  [address]       Ph: [phone]       Fax: [phone]   [id number] AUGMENTIN 875-125 MG TABS (AMOXICILLIN-POT CLAVULANATE) 1 by mouth two times a day  #20 x 0   Entered and Authorized by:   Rexene Edison NP   Signed by:   Rexene Edison NP on 03/14/2010   Method used:   Electronically to        [facility] (retail)       [address] [place], [place]  [address]       Ph: [phone]       Fax: [phone]   RxID:   [id number]    Immunization History:  Influenza Immunization History:    Influenza:  historical (12/13/2009)  Pneumovax  Immunization History:    Pneumovax:  historical (03/12/2008)

## 2010-04-13 NOTE — Assessment & Plan Note (Signed)
Summary: rov 6 months///kp   Primary Provider/Referring Provider:  D. Shaw  CC:  6 month follow up visit.  History of Present Illness: -12/30/07- Had bronchits in June. Dr. Joya Salm objected to use of Septra because of her coumadin. Continues allergy vaccine- discussed utility.Had water leak in home. Undergoing renovation. Had flu vax. Denies pain, blood, purulent, palpitation.  06/28/08- Asthma/ bronchitis, allergic rhinitis, GERD, hx DVT Husband had partial colectomy- stress on her. This Spring she c/o persistent hoarseness with hacking cough. Denies trigger but onset with tree pollen. Much sneeze. Nasal discharge nonpurulent. Frontal headache. She asks new nebulizer machine.  December 27, 2008- Asthma/ bronchitis, allergic rhinitis, GERD, hx DVT Difficult summer, stayed in saying she couldn't breathe due to the heat. Better as Fall weather came in.  We discussed her allergy vaccine- needs epipen. Need to clarify her dosing with allergy lab.  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 She has been on chronic coumadin adjusted recently with lovenox for MRI and now says plan is to place an IVC filter to take her off comadin before surgery. She made it through winter without respiratory problems and continues to breathe well. Denies chest pain, palpitation, sneeze, itch, fever, nodes or blood. She continues her regular meds as reviewed, including allergy vaccine. She recognizes May tends to be her worst month.     Current Medications (verified): 1)  Singulair 10 Mg  Tabs (Montelukast Sodium) .... Take 1 Tablet By Mouth Once A Day 2)  Amlodipine Besylate 10 Mg  Tabs (Amlodipine Besylate) .... Take 1 Tablet By Mouth Once A Day 3)  Benicar Hct 40-25 Mg  Tabs (Olmesartan Medoxomil-Hctz) .... Take 1 Tablet By Mouth Once A Day 4)  Prilosec 20 Mg  Cpdr (Omeprazole) .... Take 1 Tablet By Mouth Once A  Day 5)  Preservision/lutein   Caps (Multiple Vitamins-Minerals) 6)  Oscal 500/200 D-3 500-200 Mg-Unit  Tabs (Calcium-Vitamin D) .... Take 2 Tablet By Mouth Once A Day 7)  Osteo Bi-Flex Adv Joint Shield   Tabs (Misc Natural Products) .... Take 2  Tablet By Mouth Once A Day 8)  Ventolin Hfa 108 (90 Base) Mcg/act  Aers (Albuterol Sulfate) .... As Needed 9)  Pulmicort Flexhaler 180 Mcg/act  Inha (Budesonide) .Marland Kitchen.. 1-2 Puffs Two Times A Day 10)  Claritin 10 Mg Tabs (Loratadine) .... Take 1 By Mouth Once Daily 11)  Fish Oil 1000 Mg Caps (Omega-3 Fatty Acids) .... Take 2 By Mouth Once Daily 12)  Vitamin D-1000 Max St [phone] Mg-Unit  Tabs (Calcium-Vitamin D) .... Take 3 Per Day 13)  Vitamin B-12 1000 Mcg  Tabs (Cyanocobalamin) 14)  Allergy Vaccine 1:10 Go (W-E) 15)  Epipen 0.3 Mg/0.46ml Devi (Epinephrine) .... For Severe Allergic Reaction 16)  Amaryl 4 Mg  Tabs (Glimepiride) .... Take 1 By Mouth Once Daily 17)  Coumadin 6 Mg  Tabs (Warfarin Sodium) .... Take 9mg  X3days, 6mg  X4days 18)  L-Lysine Hcl 500 Mg Caps (Lysine Hcl) .... 2 Caps Once Daily 19)  Nebulizer Machine For Aerosol Meds 20)  Albuterol Sulfate (2.5 Mg/69ml) 0.083% Nebu (Albuterol Sulfate) .Marland Kitchen.. 1 Four Times A Day As Needed 21)  Simvastatin 40 Mg Tabs (Simvastatin) .... Take 1 By Mouth Once Daily 22)  Januvia 50 Mg Tabs (Sitagliptin Phosphate) .... Take 1 By Mouth Once Daily 23)  Furosemide 40 Mg Tabs (Furosemide) .... Take 1 By Mouth Once Daily As Needed 24)  Diazepam 5 Mg  Tabs (Diazepam) .... Take 1 By Mouth Once Daily As Needed 25)  K-Tabs 10 Meq Cr-Tabs (Potassium Chloride) .... Take 1 By Mouth Once Daily  Allergies (verified): 1)  ! Demerol 2)  ! * Asprin 3)  ! Ibuprofen 4)  ! Codeine 5)  ! * Z Pak Azithromycin  Past History:  Past Medical History: Last updated: 12/30/2007 Allergic Rhinitis Asthma Deep Vein Thrombosis/Phlebitis - after lumbar spine surgery, coumadin Diabetes, Type 2 G E R D Hypertension  Past Surgical  History: Last updated: 12/30/2007 hysterectomy tubal ligation Appendectomy Knee Hiatal hernia surgery x 2 Lumbar spine surgery - displaced by lipoma large lipoma resected low back  Family History: Last updated: 2007/04/09 Mother died DM, heart Father died 70 brother died lung ca, smoker dtr with Crohn's disease  Social History: Last updated: 12/30/2007 Patient never smoked.  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    Vital Signs:  Patient profile:   77 year old female Height:      65 inches Weight:      240.13 pounds BMI:     40.10 O2 Sat:      99 % on Room air Pulse rate:   80 / minute BP sitting:   158 / 86  (left arm) Cuff size:   large  Vitals Entered By: Clayborne Dana CMA (June 27, 2009 9:02 AM)  O2 Flow:  Room air  Physical Exam  Additional Exam:  General: A/Ox3; pleasant and cooperative, NAD, overweight SKIN: no rash, lesions NODES: no lymphadenopathy HEENT: [place]/AT, EOM- WNL, Conjuctivae- clear, PERRLA, TM-WNL, Nose- clear, Throat- hoarse, dentures, Mallampati II NECK: Supple w/ fair ROM, JVD- none, normal carotid impulses w/o bruits Thyroid- normal to palpation CHEST: Clear to P&A, trace dry crackle right apex, unlabored, no cough, rub or rales. HEART: RRR, no m/g/r heard ABDOMEN: softly obese [id number], nl pulses, Heavy legs with marked varices, no edema NEURO: Grossly intact to observation, tremor chin      Impression & Recommendations:  Problem # 1:  ASTHMA (ICD-493.90) Good control and clinically stable with good resting room air oxygenation 99% sat. Pulmonary status is currently stable for planned surgery.  Problem # 2:  Hx of DEEP VEIN  THROMBOSIS/PHLEBITIS (ICD-451.19)  Agree with plan to place IVC filter so she can be off anticoagulants. Her leg veins promise chronic increased risk of DVT. She should have mechanical prophy with sequential compression while in hospital. Her updated medication list for this problem includes:    Coumadin 6 Mg Tabs (Warfarin sodium) .Marland Kitchen... Take 9mg  x3days, 6mg  x4days  Medications Added to Medication List This Visit: 1)  Claritin 10 Mg Tabs (Loratadine) .... Take 1 by mouth once daily 2)  Simvastatin 40 Mg Tabs (Simvastatin) .... Take 1 by mouth once daily 3)  K-tabs 10 Meq Cr-tabs (Potassium chloride) .... Take 1 by mouth once daily  Other Orders: Est. Patient Level III [id number]  Patient Instructions: 1)  Please schedule a follow-up appointment in 6 months. 2)  Call sooner as needed

## 2010-04-13 NOTE — Assessment & Plan Note (Signed)
Summary: rov 6 months///kp   Primary Provider/Referring Provider:  D. Shaw  CC:  6 month follow up visit-asthma. Pressure above eyes; ? sinus trouble. Marland Kitchen  History of Present Illness: December 27, 2008- Asthma/ bronchitis, allergic rhinitis, GERD, hx DVT Difficult summer, stayed in saying she couldn't breathe due to the heat. Better as Fall weather came in.  We discussed her allergy vaccine- needs epipen. Need to clarify her dosing with allergy lab.  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 She has been on chronic coumadin adjusted recently with lovenox for MRI and now says plan is to place an IVC filter to take her off comadin before surgery. She made it through winter without respiratory problems and continues to breathe well. Denies chest pain, palpitation, sneeze, itch, fever, nodes or blood. She continues her regular meds as reviewed, including allergy vaccine. She recognizes May tends to be her worst month.  December 27, 2009- Asthmatic bronchitis, allergic rhinitis, GERD, hx DVT Nurse CC: 6 month follow up visit-asthma. Pressure above eyes; ? sinus trouble.  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 Says sudafed cleared frontal headache . Needs refill singulair and Epipen. Denies chest discomfort or wheeze- chest feels good. Doesn't want to try Neti pot. Still blowing some from nose.     Asthma History    Initial Asthma Severity Rating:    Age range: 12+ years    Symptoms: 0-2 days/week    Nighttime Awakenings: 0-2/month    Interferes w/ normal activity: no limitations    SABA use (not for EIB): 0-2 days/week    Asthma Severity Assessment: Intermittent   Preventive Screening-Counseling & Management  Alcohol-Tobacco     Smoking Status: never  Current Medications (verified): 1)  Singulair  10 Mg  Tabs (Montelukast Sodium) .... Take 1 Tablet By Mouth Once A Day 2)  Amlodipine Besylate 10 Mg  Tabs (Amlodipine Besylate) .... Take 1 Tablet By Mouth Once A Day 3)  Benicar Hct 40-25 Mg  Tabs (Olmesartan Medoxomil-Hctz) .... Take 1 Tablet By Mouth Once A Day 4)  Prilosec 20 Mg  Cpdr (Omeprazole) .... Take 1 Tablet By Mouth Once A Day 5)  Preservision/lutein   Caps (Multiple Vitamins-Minerals) 6)  Oscal 500/200 D-3 500-200 Mg-Unit  Tabs (Calcium-Vitamin D) .... Take 2 Tablet By Mouth Once A Day 7)  Ventolin Hfa 108 (90 Base) Mcg/act  Aers (Albuterol Sulfate) .... As Needed 8)  Pulmicort Flexhaler 180 Mcg/act  Inha (Budesonide) .Marland Kitchen.. 1-2 Puffs Two Times A Day 9)  Claritin 10 Mg Tabs (Loratadine) .... Take 1 By Mouth Once Daily 10)  Vitamin D-1000 Max St [phone] Mg-Unit  Tabs (Calcium-Vitamin D) .... Take 3 Per Day 11)  Vitamin B-12 1000 Mcg  Tabs (Cyanocobalamin) 12)  Allergy Vaccine 1:10 Go (W-E) 13)  Epipen 0.3 Mg/0.6ml Devi (Epinephrine) .... For Severe Allergic Reaction 14)  Amaryl 4 Mg  Tabs (Glimepiride) .... Take 1 By Mouth Once Daily 15)  Coumadin 6 Mg  Tabs (Warfarin Sodium) .... Take 9mg  Once Daily Except Thursdays-Take 12mg  16)  L-Lysine Hcl 500 Mg Caps (Lysine Hcl) .... 2 Caps Once Daily 17)  Nebulizer Machine For Aerosol Meds 18)  Albuterol Sulfate (2.5 Mg/3ml) 0.083% Nebu (Albuterol Sulfate) .Marland Kitchen.. 1 Four Times A Day As Needed 19)  Januvia 50 Mg Tabs (Sitagliptin Phosphate) .... Take 1 By Mouth Once Daily  20)  Furosemide 40 Mg Tabs (Furosemide) .... Take 1 By Mouth Once Daily As Needed 21)  Vytorin 10-40 Mg Tabs (Ezetimibe-Simvastatin) .... Take 1 By Mouth Once Daily 22)  Metamucil 0.52 Gm Caps (Psyllium) .... Take 2 By Mouth Once Daily  Allergies (verified): 1)  ! Demerol 2)  ! * Asprin 3)  ! Ibuprofen 4)  ! Codeine 5)  ! * Z Pak Azithromycin  Past History:  Past Medical History: Last updated: 12/30/2007 Allergic Rhinitis Asthma Deep Vein Thrombosis/Phlebitis -  after lumbar spine surgery, coumadin Diabetes, Type 2 G E R D Hypertension  Past Surgical History: Last updated: 12/30/2007 hysterectomy tubal ligation Appendectomy Knee Hiatal hernia surgery x 2 Lumbar spine surgery - displaced by lipoma large lipoma resected low back  Family History: Last updated: 04-12-2007 Mother died DM, heart Father died 70 brother died lung ca, smoker dtr with Crohn's disease  Social History: Last updated: 12/30/2007 Patient never smoked.  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  The patient denies shortness of breath at rest, productive cough, non-productive cough, coughing up blood, chest pain, irregular heartbeats, acid heartburn, indigestion, loss of appetite, weight change, abdominal pain, difficulty swallowing, sore throat, tooth/dental problems, headaches, sneezing, itching, rash, and fever.         Tremor  Vital Signs:  Patient profile:   77 year old female Height:      65 inches Weight:      226 pounds BMI:     37.74 O2 Sat:      98 % on Room air Pulse rate:   71 / minute BP sitting:   168 / 80  (left arm) Cuff size:   large  Vitals Entered By: Clayborne Dana CMA (December 27, 2009 8:57 AM)  O2 Flow:  Room air CC: 6 month follow up visit-asthma. Pressure above eyes; ? sinus trouble.    Physical Exam  Additional Exam:  General: A/Ox3; pleasant and cooperative, NAD, overweight SKIN: no rash, lesions NODES: no lymphadenopathy HEENT: [place]/AT, EOM- WNL, Conjuctivae- clear, PERRLA, TM-WNL, Nose- clear, Throat- hoarse, dentures, Mallampati II NECK: Supple w/ fair ROM, JVD- none, normal carotid impulses w/o bruits Thyroid- normal to palpation CHEST:, unlabored, no cough, rub or rales.RA sat 98% HEART: RRR, no m/g/r heard ABDOMEN: softly  obese [id number], nl pulses, Heavy legs with marked varices, no edema NEURO: Grossly intact to observation, tremor chin      Impression & Recommendations:  Problem # 1:  ASTHMA (ICD-493.90) Good control. We will refill her singulair. Got flu shot. Had steroid injection for joint pain yesterday. I explained this may have some benefit for airway irritability as a side effect.  Problem # 2:  ALLERGIC RHINITIS (ICD-477.9)  Probable resolving sinusitis. We can give neb today and she can use sudafed as needed. She is on Coumaidin and we will not gfve antibiotic now unless we need to.  Her updated medication list for this problem includes:    Claritin 10 Mg Tabs (Loratadine) .Marland Kitchen... Take 1 by mouth once daily  Problem # 3:  Hx of DEEP VEIN THROMBOSIS/PHLEBITIS (ICD-451.19) She remains on coumadin appropriately. Her peripheral venous insufficiency leaves her permanently at increased risk for stagnation of blood flow and thrombosis.  Her updated medication list for this problem includes:    Coumadin  6 Mg Tabs (Warfarin sodium) .Marland Kitchen... Take 9mg  once daily except thursdays-take 12mg   Medications Added to Medication List This Visit: 1)  Coumadin 6 Mg Tabs (Warfarin sodium) .... Take 9mg  once daily except thursdays-take 12mg  2)  Vytorin 10-40 Mg Tabs (Ezetimibe-simvastatin) .... Take 1 by mouth once daily 3)  Metamucil 0.52 Gm Caps (Psyllium) .... Take 2 by mouth once daily  Other Orders: Est. Patient Level IV [id number]) Nebulizer Tx [id number])  Patient Instructions: 1)  Please schedule a follow-up appointment in 6 months. 2)  Refills sent for Singulair and Epipen 3)  Ok to use sudafed when you need, and you can also take mucinex to help your sinuses drain. If you don't want to try a Neti pot,then a simple saline nose spray might also help a little. 4)  Neb neo nasal Prescriptions: EPIPEN 0.3 MG/0.3ML DEVI (EPINEPHRINE) For severe allergic reaction  #1 x prn   Entered and Authorized by:   Deneise Lever MD   Signed by:   Deneise Lever MD on 12/27/2009   Method used:   Electronically to        [facility] (retail)       [address]       [place], [place]  [address]       Ph: [phone]       Fax: [phone]   [id number] VENTOLIN HFA 108 (90 BASE) MCG/ACT  AERS (ALBUTEROL SULFATE) as needed  #18 x 2   Entered and Authorized by:   Deneise Lever MD   Signed by:   Deneise Lever MD on 12/27/2009   Method used:   Electronically to        [facility] (retail)       [place], [place]  [address]       Ph: [phone]       Fax: [phone]   [id number] SINGULAIR 10 MG  TABS (MONTELUKAST SODIUM) Take 1 tablet by mouth once a day  #30 Tablet x prn   Entered and Authorized by:   Deneise Lever MD   Signed by:   Deneise Lever MD on 12/27/2009   Method used:   Electronically to        [facility] (retail)       [place], [place]  [address]       Ph: [phone]       Fax: [phone]   [id number]      Medication Administration  Medication # 1:    Medication: EMR miscellaneous medications    Diagnosis: ALLERGIC RHINITIS (ICD-477.9)    Dose: 3 drops    Route: intranasal    Exp Date: 07/12    Lot #: [id number]    Mfr: BAYER    Comments: Neo-Synephrine    Patient tolerated medication without complications    Given by: Donita Brooks RN (December 27, 2009 9:34 AM)  Orders Added: 1)  Est. Patient Level IV [id number] 2)  Nebulizer Tx [phone]

## 2010-04-13 NOTE — Miscellaneous (Signed)
Summary: Injection Orders / [facility]    Injection Orders /  Allergy    Imported By: Rise Patience 08/09/2009 15:16:27  _____________________________________________________________________  External Attachment:    Type:   Image     Comment:   External Document

## 2010-05-10 ENCOUNTER — Other Ambulatory Visit: Payer: Self-pay | Admitting: Neurosurgery

## 2010-05-17 ENCOUNTER — Other Ambulatory Visit: Payer: Self-pay

## 2010-05-29 LAB — URINE CULTURE: Colony Count: >=100000

## 2010-05-29 LAB — CBC
HCT: 25.6 % — ABNORMAL LOW (ref 36.0–46.0)
HCT: 25.8 % — ABNORMAL LOW (ref 36.0–46.0)
Hemoglobin: 8.7 g/dL — ABNORMAL LOW (ref 12.0–15.0)
MCV: 90.6 fL (ref 78.0–100.0)
RBC: 2.83 MIL/uL — ABNORMAL LOW (ref 3.87–5.11)
RBC: 2.85 MIL/uL — ABNORMAL LOW (ref 3.87–5.11)
RDW: 13.4 % (ref 11.5–15.5)
WBC: 10 10*3/uL (ref 4.0–10.5)

## 2010-05-29 LAB — GLUCOSE, CAPILLARY
Glucose-Capillary: 106 mg/dL — ABNORMAL HIGH (ref 70–99)
Glucose-Capillary: 115 mg/dL — ABNORMAL HIGH (ref 70–99)
Glucose-Capillary: 131 mg/dL — ABNORMAL HIGH (ref 70–99)
Glucose-Capillary: 153 mg/dL — ABNORMAL HIGH (ref 70–99)
Glucose-Capillary: 170 mg/dL — ABNORMAL HIGH (ref 70–99)

## 2010-05-29 LAB — PROTIME-INR: INR: 1.2 (ref 0.00–1.49)

## 2010-05-29 LAB — BASIC METABOLIC PANEL
BUN: 22 mg/dL (ref 6–23)
CO2: 30 mEq/L (ref 19–32)
Calcium: 9.1 mg/dL (ref 8.4–10.5)
Chloride: 98 mEq/L (ref 96–112)
Creatinine, Ser: 1.34 mg/dL — ABNORMAL HIGH (ref 0.4–1.2)
GFR calc Af Amer: 46 mL/min — ABNORMAL LOW (ref >60)
GFR calc non Af Amer: 38 mL/min — ABNORMAL LOW (ref >60)
Glucose, Bld: 76 mg/dL (ref 70–99)
Potassium: 3.9 mEq/L (ref 3.5–5.1)
Sodium: 135 mEq/L (ref 135–145)

## 2010-05-29 LAB — URINALYSIS, ROUTINE W REFLEX MICROSCOPIC
Ketones, ur: 15 mg/dL — AB
Nitrite: POSITIVE — AB
Urobilinogen, UA: 1 mg/dL (ref 0.0–1.0)

## 2010-05-29 LAB — URINE MICROSCOPIC-ADD ON

## 2010-05-30 LAB — CBC
HCT: 26.1 % — ABNORMAL LOW (ref 36.0–46.0)
Hemoglobin: 9 g/dL — ABNORMAL LOW (ref 12.0–15.0)
MCV: 89.7 fL (ref 78.0–100.0)
Platelets: 168 10*3/uL (ref 150–400)
Platelets: 199 10*3/uL (ref 150–400)
RBC: 4.69 MIL/uL (ref 3.87–5.11)
WBC: 10.6 10*3/uL — ABNORMAL HIGH (ref 4.0–10.5)
WBC: 8.6 10*3/uL (ref 4.0–10.5)

## 2010-05-30 LAB — SURGICAL PCR SCREEN
MRSA, PCR: NEGATIVE
Staphylococcus aureus: POSITIVE — AB

## 2010-05-30 LAB — GLUCOSE, CAPILLARY
Glucose-Capillary: 113 mg/dL — ABNORMAL HIGH (ref 70–99)
Glucose-Capillary: 174 mg/dL — ABNORMAL HIGH (ref 70–99)
Glucose-Capillary: 78 mg/dL (ref 70–99)
Glucose-Capillary: 96 mg/dL (ref 70–99)

## 2010-05-30 LAB — PROTIME-INR
INR: 1.08 (ref 0.00–1.49)
INR: 2.3 — ABNORMAL HIGH (ref 0.00–1.49)
Prothrombin Time: 25.1 seconds — ABNORMAL HIGH (ref 11.6–15.2)

## 2010-05-30 LAB — TYPE AND SCREEN
ABO/RH(D): A POS
Antibody Screen: NEGATIVE

## 2010-05-30 LAB — BASIC METABOLIC PANEL
Calcium: 10.6 mg/dL — ABNORMAL HIGH (ref 8.4–10.5)
Chloride: 107 mEq/L (ref 96–112)
Creatinine, Ser: 1.58 mg/dL — ABNORMAL HIGH (ref 0.4–1.2)
GFR calc Af Amer: 38 mL/min — ABNORMAL LOW (ref >60)
GFR calc non Af Amer: 32 mL/min — ABNORMAL LOW (ref >60)
GFR calc non Af Amer: 37 mL/min — ABNORMAL LOW (ref >60)
Potassium: 3.5 mEq/L (ref 3.5–5.1)
Sodium: 133 mEq/L — ABNORMAL LOW (ref 135–145)

## 2010-05-30 LAB — POCT I-STAT 4, (NA,K, GLUC, HGB,HCT)
Glucose, Bld: 159 mg/dL — ABNORMAL HIGH (ref 70–99)
Potassium: 5.9 mEq/L — ABNORMAL HIGH (ref 3.5–5.1)

## 2010-05-30 LAB — APTT
aPTT: 28 seconds (ref 24–37)
aPTT: 38 seconds — ABNORMAL HIGH (ref 24–37)

## 2010-05-31 ENCOUNTER — Other Ambulatory Visit: Payer: Self-pay | Admitting: Neurosurgery

## 2010-05-31 ENCOUNTER — Ambulatory Visit
Admission: RE | Admit: 2010-05-31 | Discharge: 2010-05-31 | Disposition: A | Discharge status: Home / Self Care | Payer: Medicare Other | Source: Ambulatory Visit | Attending: Neurosurgery | Admitting: Neurosurgery

## 2010-06-21 ENCOUNTER — Other Ambulatory Visit: Payer: Self-pay | Admitting: Internal Medicine

## 2010-06-21 DIAGNOSIS — Z1231 Encounter for screening mammogram for malignant neoplasm of breast: Secondary | ICD-10-CM

## 2010-06-26 ENCOUNTER — Encounter: Payer: Self-pay | Admitting: Internal Medicine

## 2010-06-27 ENCOUNTER — Encounter: Payer: Self-pay | Admitting: Internal Medicine

## 2010-06-27 ENCOUNTER — Ambulatory Visit (INDEPENDENT_AMBULATORY_CARE_PROVIDER_SITE_OTHER): Payer: Medicare Other | Admitting: Internal Medicine

## 2010-06-27 VITALS — BP 150/70 | HR 68 | Ht 65.0 in | Wt 228.6 lb

## 2010-06-27 DIAGNOSIS — J309 Allergic rhinitis, unspecified: Secondary | ICD-10-CM

## 2010-06-27 DIAGNOSIS — J45909 Unspecified asthma, uncomplicated: Secondary | ICD-10-CM

## 2010-06-27 DIAGNOSIS — J209 Acute bronchitis, unspecified: Secondary | ICD-10-CM

## 2010-06-27 NOTE — Patient Instructions (Addendum)
Consider trying nasal saline gel- like AYR or any store brand- otc.. You can put some in the side of your nose that bleeds, like a sort of bandage.   Continue allergy vaccine and your other meds. Please call as needed.   Try sample nasal steroid spray-      1-2 puffs each nostril, once daily at bedtime.

## 2010-06-27 NOTE — Assessment & Plan Note (Signed)
Good control now.

## 2010-06-27 NOTE — Progress Notes (Signed)
  Subjective:    Patient ID: [name] [name], female    DOB: February 25, 1930, 77 y.o.   MRN: [id number]  HPI 77 yoF followed for asthma and allergic rhinitis, complicated by hx of DVT, hx lipomas and GERD. Last here 12/27/09. She was seen by NP here for acute bronchitis with good response, then had Norovirus GI. She had f/u MRI for known lipoma in neck for which she is supposed to have traction/ Dr Joya Salm.  Seasonal pollens causing some frontal headache, sneeze, itch and rhinorhea. Drip makes her cough w/o wheeze. Ventolin will stop the cough. She continues allergy vaccine. Continues Zyrtec and plans to try Claritin. Some epistaxis. Review of Systems See HPI Constitutional:   No weight loss, night sweats,  Fevers, chills, fatigue, lassitude. HEENT:  Neck pain.     No- Difficulty swallowing,  Tooth/dental problems,  Sore throat,                 CV:  No chest pain,  Orthopnea, PND, swelling in lower extremities, anasarca, dizziness, palpitations  GI  No heartburn, indigestion, abdominal pain, nausea, vomiting, diarrhea, change in bowel habits, loss of appetite  Resp: No shortness of breath with exertion or at rest. ,  No coughing up of blood.  No change in color of mucus.  No wheezing.  Skin: no rash or lesions.  GU: no dysuria, change in color of urine, no urgency or frequency.  No flank pain.  MS:  No joint pain or swelling.  No decreased range of motion.  No back pain.  Psych:  No change in mood or affect. No depression or anxiety.  No memory loss.      Objective:   Physical Exam General- Alert, Oriented, Affect-appropriate, Distress- none acute  Skin- rash-none, lesions- none, excoriation- none  Lymphadenopathy- none  Head- atraumatic  Eyes- Gross vision intact, PERRLA, conjunctivae clear secretions  Ears- Hearing normal- canals, Tm L ,R ,  Nose- Clear,  No-Septal dev, mucus, polyps,  perforation     Minor clot lateral upper left nare Throat- Mallampati II , mucosa clear ,  drainage- none, tonsils- atrophic.   Chronic mild hoarseness. Dentures.  Neck-  , trachea midline, no stridor , thyroid nl, carotid no bruit  Chest - symmetrical excursion , unlabored     Heart/CV- RRR , no murmur , no gallop  , no rub, nl s1 s2                     - JVD- none , edema- none, stasis changes- none, varices- none     Lung- clear to P&A, wheeze- none, cough- none , dullness-none, rub- none     Chest wall-  Abd- tender-no, distended-no, bowel sounds-present, HSM- no  Br/ Gen/ Rectal- Not done, not indicated  Extrem- cyanosis- none, clubbing, none, atrophy- none, strength- nl  Neuro- Mild resting tremor mouth        Assessment & Plan:

## 2010-06-27 NOTE — Assessment & Plan Note (Signed)
Mild seasonal flare. We will give sample nasal steroid, considering antihistamine spray as an alternative. She can try changing to clairitn. Suggested saline gel as treatment for the minor epistaxis.

## 2010-07-02 ENCOUNTER — Encounter: Payer: Self-pay | Admitting: Internal Medicine

## 2010-07-02 NOTE — Assessment & Plan Note (Signed)
Post nasal drip is causing some cough. There may be a mild cough-equivalent asthma since she feels that her rescue inhaler gives relief.

## 2010-07-07 ENCOUNTER — Ambulatory Visit
Admission: RE | Admit: 2010-07-07 | Discharge: 2010-07-07 | Disposition: A | Discharge status: Home / Self Care | Payer: Medicare Other | Source: Ambulatory Visit | Attending: Internal Medicine | Admitting: Internal Medicine

## 2010-07-07 DIAGNOSIS — Z1231 Encounter for screening mammogram for malignant neoplasm of breast: Secondary | ICD-10-CM

## 2010-07-25 ENCOUNTER — Ambulatory Visit (INDEPENDENT_AMBULATORY_CARE_PROVIDER_SITE_OTHER): Payer: Medicare Other

## 2010-07-25 DIAGNOSIS — J309 Allergic rhinitis, unspecified: Secondary | ICD-10-CM

## 2010-07-25 NOTE — Assessment & Plan Note (Signed)
OFFICE VISIT   NISI, DEVEGA  DOB:  March 31, 1929                                       07/21/2007  [id number]   The patient underwent laser ablation of her left great saphenous vein  with between 10 and 20 stab phlebectomies in the left thigh and calf  today without difficulty.  I discussed her situation with Dr.  Beryle Beams, who had recommended treating her with Lovenox following the  procedure and then beginning Coumadin.  We did give her a prescription  for 40 mg of Lovenox to be injected daily by her daughter (nurse) and  also gave her a prescription for Coumadin 4 mg per day, which she will  begin tomorrow and then have her blood work checked by Dr. Beryle Beams  next week to adjust her dosage.  She will return to see me in 1 week for  followup regarding her laser ablation procedure, which was uneventful.   Nelda Severe Kellie Simmering, M.D.  Electronically Signed   JDL/MEDQ  D:  07/21/2007  T:  07/22/2007  Job:  [id number]

## 2010-07-25 NOTE — Assessment & Plan Note (Signed)
OFFICE VISIT   [name] [name], [name] [name]  DOB:  June 05, 1929                                       04/21/2007  [id number]   REASON FOR VISIT:  Follow up leg swelling/reflux.   HISTORY:  This is a 77 year old female who has a history of multiple  deep venous thrombosis, who I evaluated in December for swelling.  She  was found to have reflux in her left greater saphenous vein.  At that  time, I had sent her for a hypercoagulable workup.  She has also been  placed in graduated compression stockings.  She comes back in today for  followup.  She has been having some difficulty wearing stockings, but  they have improved her swelling.   PHYSICAL EXAMINATION:  Blood pressure is 144/81, pulse 82, respirations  18.  General:  She is well-appearing in no acute distress.  Her  extremities reveal edema bilaterally, left greater than right.  No  ulceration.   ASSESSMENT/PLAN:  Left leg reflux:  Patient was seen by Dr. Kellie Simmering in  the office today for possible venous closure.  She will be placed in  thigh-high compression today.  She has already worn stockings for  approximately six weeks.  She is having difficulty wearing them.  The  patient's hypercoagulable workup came back positive for elevated  homocysteine.  She was negative for Factor V Leiden and prothrombin gene  mutation.  For this reason, I am going to send her to hematology to see  if there is any other blood work that needs to be obtained and to see if  she needs to be placed on folate replacement or whether she would be a  candidate for anticoagulation.  She is going to follow up with Dr.  Kellie Simmering in six weeks to discuss venous closure.   Eldridge Abrahams, MD  Electronically Signed   VWB/MEDQ  D:  04/21/2007  T:  04/23/2007  Job:  [id number]   cc:   Sherril Croon, M.D.

## 2010-07-25 NOTE — Assessment & Plan Note (Signed)
[facility]                             PULMONARY OFFICE NOTE   NAME:[name], [name] HUGO                     MRN:          [id number]  DATE:09/30/2006                            DOB:          06-25-1929    PROBLEM LIST:  1. Asthmatic bronchitis.  2. Allergic rhinitis.  3. Esophageal reflux.  4. Diabetes with obesity.  5. Remote deep vein thrombosis with pregnancy.   HISTORY:  She is pending resection of what may be a lipoma on the left  parasacral area under general anesthesia next week with Dr. Joya Salm and  Dr. Georganna Skeans.  Breathing has felt quite stable and she has no  cough or wheeze, no chest pain, no acute events.  She re-emphasizes for  me, however, that she continues to wish to be maintained as a no code  blue, so I marked that on the chart again.  Her daughter is a  respiratory therapist and is here with her supporting her understanding  of that request.   MEDICATIONS:  1. Allergy vaccine is continued at 1:10 with no problems and she does      have an EpiPen.  2. Pulmicort 2 puffs b.i.d.  3. Singulair 10 mg.  4. Amlodipine 10 mg.  5. Benicar 40/25.  6. Vytorin 10/30.  7. Prilosec 20 mg.  8. Vitamins and minerals.  9. Darvocet occasional.  10.Albuterol inhaler.   DRUG INTOLERANCE:  1. DEMEROL.  2. ASPIRIN.  3. IBUPROFEN.   OBJECTIVE:  VITAL SIGNS:  Weight 240 pounds, BP 116/70, pulse 84, room  air saturation 99%.  NEUROLOGIC:  Chronic tremor with head bob.  CHEST:  Clear, unlabored breathing.  No stridor.  No neck vein  distention.  HEART:  Sounds regular without murmur.  No peripheral edema.   IMPRESSION:  1. Allergic rhinitis, well controlled.  2. Asthma, well controlled.  3. Esophageal reflux.  4. Diabetes with obesity.  5. Remote deep vein thrombosis with pregnancy.   I do not anticipate special problems with anesthesia for her lipoma  resection.   Schedule return 6 months, earlier p.r.n.     Clinton D.  Annamaria Boots, MD, [place], [place]  Electronically Signed    CDY/MedQ  DD: 10/05/2006  DT: 10/06/2006  Job #: [id number]   cc:   Milford Cage. Laurann Montana, M.D.

## 2010-07-25 NOTE — Procedures (Signed)
DUPLEX DEEP VENOUS EXAM - LOWER EXTREMITY   INDICATION:  One week followup of laser ablation in the left leg.   HISTORY:  Edema:  Yes.  Trauma/Surgery:  One week status post laser ablation of greater  saphenous vein.  Pain:  Yes.  PE:  No.  Previous DVT:  No.  Anticoagulants:  Other:   DUPLEX EXAM:                CFV   SFV   PopV  PTV    GSV                R  L  R  L  R  L  R   L  R  L  Thrombosis    o  o     o     o      o     +  Spontaneous   +  +     +     +      +     0  Phasic        +  +     +     +      +     0  Augmentation  +  +     +     +      +     0  Compressible  +  +     +     +      +     0  Competent   Legend:  + - yes  o - no  p - partial  D - decreased   IMPRESSION:  1. No evidence of deep venous thrombosis noted in the left lower      extremity.  2. The left greater saphenous vein is partially occluded from the      proximal to mid thigh level with the remainder of the greater      saphenous vein being totally occluded.  3. Varicose veins are totally occluded from the mid to distal thigh      and knee level with partially occluded varicose veins noted at the      proximal to mid calf levels.    _____________________________  Nelda Severe Kellie Simmering, M.D.   CH/MEDQ  D:  07/29/2007  T:  07/29/2007  Job:  [id number]

## 2010-07-25 NOTE — Procedures (Signed)
LOWER EXTREMITY VENOUS REFLUX EXAM   INDICATION:  Left lower extremity swelling, varicose veins.   EXAM:  Using color-flow imaging and pulse Doppler spectral analysis, the  left common femoral, superficial femoral, popliteal, posterior tibial,  greater and lesser saphenous veins are evaluated.  There is evidence  suggesting deep venous insufficiency in the left lower extremity common  femoral vein.   The left saphenofemoral junction is not competent.  The left GSV is not  competent with the caliber as described below.   The left proximal short saphenous vein demonstrates competency.   GSV Diameter (used if found to be incompetent only)                                            Right    Left  Proximal Greater Saphenous Vein           cm       1.94 cm  Proximal-to-mid-thigh                     cm       1.14 cm  Mid thigh                                 cm       1.24 cm  Mid-distal thigh                          cm       0.86 cm  Distal thigh                              cm       1.08 cm  Knee                                      cm       1.06 cm   IMPRESSION:  1. The left greater saphenous vein reflux is identified with the      caliber ranging from 0.86 cm to 1.94 cm knee to groin.  2. The left greater saphenous vein shows focal dilatation proximally      of 1.94 cm.  3. The left greater saphenous vein is mildly tortuous in the proximal      thigh, becoming more tortuous in the distal thigh.  4. The deep venous system is not competent in the common femoral vein.  5. The left short saphenous vein is competent.  6. No evidence of deep venous thrombosis in the left lower extremity.  7. Evidence of chronic superficial thrombophlebitis in the greater      saphenous vein in the left calf.  8. Evidence of incompetent perforators in the left proximal and distal      calf.  9. Note:  Arterial flow appears triphasic in the left lower extremity.   ___________________________________________  V. Leia Alf, MD   AS/MEDQ  D:  03/10/2007  T:  03/10/2007  Job:  [id number]

## 2010-07-25 NOTE — H&P (Signed)
[name] [name], [name] [name] NO.:  [id number]   MEDICAL RECORD NO.:  [id number]          PATIENT TYPE:  INP   LOCATION:  5152                         FACILITY:  [facility]   PHYSICIAN:  Leeroy Cha, M.D.   DATE OF BIRTH:  [birth year]   DATE OF ADMISSION:  10/16/2006  DATE OF DISCHARGE:                              HISTORY & PHYSICAL   HISTORY OF PRESENT ILLNESS:  [name] [name] is a lady who many years ago  underwent fusion of L-4-5.  She did well, but for the past few weeks she  had been complaining of back pain with radiation to both feet and she is  quite miserable.  The patient has difficulty sitting and standing.  On  top of that, it was found that she has a mass in the right gluteal area.  MRI showed that she has stenosis at the level of  3-4.  Because of  worsening pain, she want to proceed with surgery.   PAST MEDICAL HISTORY:  1. Lumbar fusion 4-5.  2. Cancer of the nose.  3. Bilateral knee surgery.  4. Hysterectomy.  5. Appendectomy.   REVIEW OF SYSTEMS:  Positive for sinus headache, high blood pressure,  diabetes skin cancer.Marland Kitchen   PHYSICAL EXAMINATION:  GENERAL:  Patient who had been difficulty with  the left leg is limping from it.  EARS/NORSE/THROAT:  Normal.  NECK:  She has no flexibility.  LUNGS:  Lungs are clear.  HEART:  Heart sounds normal.  EXTREMITIES:  Normal pulses.  She has a mass in the gluteal  approximately apex 8 cm.  NEURO:  She has decreased flexion of the lumbar spine.  She has a fusion  at that level.  She has a scar in that area.  She has straight leg  raising that is positive 80 degrees and the __________  maneuver is  positive bilaterally.   CLINICAL IMPRESSION:  1. Lumbar stenosis L3-L4 with bilateral foraminal narrowing.  2. Gluteal mass.   RECOMMENDATIONS:  The patient is being admitted for surgery.  Procedure  will be a bilateral L3 laminectomy and foraminotomy with posterolateral  arthrodesis with autograft and BMP.  The  patient knows about the risks  of the surgeries, chance of infection, CSF leak, worsening pain, need  for surgery.  She also during the same procedure is going to have  surgery of the gluteal mass by Dr. Grandville Silos from the general surgery  service.           ______________________________  Leeroy Cha, M.D.    EB/MEDQ  D:  10/16/2006  T:  10/17/2006  Job:  [id number]

## 2010-07-25 NOTE — Assessment & Plan Note (Signed)
OFFICE VISIT   [name], [name]  DOB:  04-06-1929                                       07/29/2007  [id number]   the patient returns today 1 week post laser ablation of her left great  saphenous vein with multiple stab phlebectomies in the left thigh and  calf for painful varicosities.  She has had some mild to moderate  bruising in the distal thigh near the vein the entrance site, which is  not unexpected.  This has been the largest area of greatest discomfort.  She had some mild aching in the proximal to mid thigh over the greater  saphenous vein where the ablation took place.  The stab phlebectomy  sites are all healing nicely and there is no change in distal edema.  She has no tenderness distally.  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  The saphenofemoral junction  to the proximal third of the thigh is about 75% occluded with some flow  in the saphenous vein at this point.  Very minimal reflux is noted.  She  was reassured regarding these findings.  There is no evidence of any  deep venous thrombosis, which she has had a history of in the past.  She  is continuing on her Lovenox until the Coumadin is regulated by Dr.  Beryle Beams.  She will return to see [place] on a p.r.n. basis.   Nelda Severe Kellie Simmering, M.D.  Electronically Signed   JDL/MEDQ  D:  07/29/2007  T:  07/30/2007  Job:  [id number]

## 2010-07-25 NOTE — Op Note (Signed)
NAMEJESSIE, [name] [name] NO.:  [id number]   MEDICAL RECORD NO.:  [id number]          PATIENT TYPE:  INP   LOCATION:  2899                         FACILITY:  [facility]   PHYSICIAN:  Merri Ray. Grandville Silos, M.D.DATE OF BIRTH:  23-Jan-1930   DATE OF PROCEDURE:  10/16/2006  DATE OF DISCHARGE:                               OPERATIVE REPORT   PREOPERATIVE DIAGNOSIS:  Lipoma, left buttock, 15 cm.   POSTOPERATIVE DIAGNOSIS:  Lipoma, left buttock, 15 cm.   PROCEDURES:  Excision of lipoma, left buttock.   SURGEON:  Georganna Skeans, MD   ANESTHESIA:  General.   HISTORY OF PRESENT ILLNESS:  Ms. Lenton is a 77 year old female who is  undergoing lumbar spine surgery by Dr. Joya Salm.  She has also had a  painful lipoma of her left buttock.  MRI was consistent with a simple  lipoma.  We are proceeding with excision as the first procedure and Dr.  Joya Salm is following with lumbar spine surgery.   PROCEDURE IN DETAIL:  Informed consent was obtained.  The patient  received intravenous antibiotics.  Her site was marked after identifying  her in preop holding area.  She is brought to operating room and general  anesthesia was administered.  She was placed in prone position.  Buttocks and lower back were prepped and draped in a sterile fashion  including an Ioban drape.  A transverse incision was made over the  easily palpable mass of her left buttock.  Subcutaneous tissues were  dissected down, revealing an lobular, encapsulated mass 15 cm in size;  this was circumferentially dissected.  Excellent hemostasis was obtained  using the Bovie cautery and the mass was excised in 1 piece and sent to  Pathology.  The wound was copiously irrigated with saline.  Again,  hemostasis was ensure with Bovie cautery.  The wound was then closed in  layers.  Subcutaneous tissues were approximated with interrupted 2-0  Vicryl sutures.  The subcutaneous tissues were irrigated.  Some 0.25%  Marcaine with  epinephrine was injected.  The skin was closed with  staples.  A bulky sterile dressing was applied.  Sponge, needle and  instrument counts were correct.  The patient tolerated the procedure  well without apparent complications.  She remained in the operating room  with Dr. Joya Salm.      Merri Ray Grandville Silos, M.D.  Electronically Signed     BET/MEDQ  D:  10/16/2006  T:  10/16/2006  Job:  [id number]   cc:   Leeroy Cha, M.D.

## 2010-07-25 NOTE — Assessment & Plan Note (Signed)
OFFICE VISIT   LUCEE, PEZZULO  DOB:  01-Feb-1930                                       04/21/2007  [id number]   Patient returns today for further followup regarding her severe venous  insufficiency in the left leg.  She has been evaluated by Dr. Trula Slade in  12/08.  She continues to have pain and severe swelling in her left leg  secondary to her venous insufficiency, despite wearing elastic  compression stockings and trying elevation of the leg.  She has had  multiple DVTs in the past, and her duplex exam reveals the deep system  to be widely patent and severe reflux in the left saphenofemoral  junction as well as the entire left greater saphenous vein.  She has  large bulbous varicosities in the left thigh and medial calf and 2+  edema in the calf and ankle of the left leg.   We will continue conservative treatment with elastic compression  stockings, elevation, and pain medication.  She will return to see me in  six weeks, and if there has been no improvement, I think she would  benefit from laser ablation of her left greater saphenous vein with  multiple stab phlebectomies.  A decision regarding chronic Coumadin  therapy following this procedure will then be made.   Nelda Severe Kellie Simmering, M.D.  Electronically Signed   JDL/MEDQ  D:  04/21/2007  T:  04/22/2007  Job:  [id number]

## 2010-07-25 NOTE — Assessment & Plan Note (Signed)
OFFICE VISIT   TYNECIA, CORTIJO  DOB:  05/19/1929                                       03/10/2007  [id number]   REASON FOR VISIT:  Evaluate varicose veins.   HISTORY:  This is a 77 year old female with a history of multiple deep  venous thromboses who I am seeing at the request of [name] for  evaluation of swelling.  Patient states that she has had swelling for  many, many, many years.  She has never had ulcers.  This swelling mostly  bothered her when she was working on her feet all day; however, now she  tries to stay off of her feet to avoid the achiness that is associated  with her swelling.  Patient states that she has never been diagnosed  with a hypercoagulable state; however, she does not feel that she has  ever been tested.  She has had multiple DVTs but has never been on  anticoagulation.  She has never had a pulmonary embolism.  She has never  had ulcers.  She has tried compression stocking in the past when she was  on her feet all day; however, she currently does not wear compression  stockings.   REVIEW OF SYSTEMS:  GENERAL:  Her weight is 250.  Her height is 66  inches.  CARDIAC:  Negative.  PULMONARY:  Positive for asthma and wheezing.  GI:  Positive for reflux and hiatal hernia.  GU:  Positive for kidney disease.  VASCULAR:  Positive for blood clots and phlebitis.  NEURO:  Negative.  ORTHO:  Positive for arthritis and joint pain.  PSYCH:  Negative.  ENT:  Negative.  HEME:  Negative.   PAST MEDICAL HISTORY:  1. Hypertension.  2. Type 2 diabetes.  3. Bilateral cataracts.  4. Adult asthma.  5. Spinal stenosis.  6. Hypercholesterolemia.   PAST SURGICAL HISTORY:  1. Bilateral cataracts.  2. Tubal ligation.  3. Appendectomy.  4. D&C.  5. Hysterectomy.  6. Hiatal hernia repair.  7. Repeat hiatal hernia repair.  8. Bilateral knee surgery.  9. Lumbosacral spine surgery.  10.Right knee arthroplasty.  11.Left knee  arthroplasty.  12.Skin flap secondary to left-sided skin cancer on the nose.   MEDICATIONS:  Multiple, please see patient's medical record.   ALLERGIES:  Include CODEINE and DEMEROL, which cause vomiting.  IBUPROFEN and ASPIRIN, which causes GI distress as well as allergic  rhinitis.   SOCIAL HISTORY:  She is married with four children.  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  She does not drink or smoke.   FAMILY HISTORY:  Negative for cardiovascular disease.  No history of DVT  in the family.   PHYSICAL EXAMINATION:  Vital signs:  Heart rate 97, blood pressure  143/82, O2 sats are 98%.  General:  She is obese and well-appearing in  no acute distress.  HEENT:  She is normocephalic and atraumatic.  Cardiovascular:  Regular rate and rhythm.  Lungs:  Clear.  Respirations  are nonlabored.  Her abdomen is obese.  No hepatosplenomegaly.  Extremities:  There is edema bilaterally.  The left is larger in size  than the right.  At the mid calf, the left side measures 54 cm.  The  right measures 51.  On the left, there is a medial varicosity above the  knee, which  is very prominent.  There are skin pigment changes at the  level of the left ankle.   DIAGNOSTIC STUDIES:  Patient underwent duplex today, which shows no  evidence of DVT in the left leg.  There is evidence of chronic  superficial thrombophlebitis in the left calf.  There are incompetent  perforators in the proximal and distal calf.  The arterial flow appears  triphasic.  There is reflux in the greater saphenous vein.   ASSESSMENT:  This is a 77 year old female with varicose veins and reflux  on the left leg.   PLAN:  At this time, I have recommended the patient be fitted into 20-30  mm compression to help minimize her edema as well as her symptoms of  pain and discomfort.  Based on her history of multiple DVTs, I am also  sending off a hypercoagulable panel to make sure that she does not have  a hypercoagulable state  which would require her to be placed on Coumadin  therapy.  I am recommending that she come back to see me in  approximately six weeks.  At that time, we will evaluate how she has  been doing with a compression stocking as well as review her  hypercoagulable profile.  At that time, we can make decisions regarding  the next step, whether it be laser ablation or continued supportive  care.   Eldridge Abrahams, MD  Electronically Signed   VWB/MEDQ  D:  03/10/2007  T:  03/11/2007  Job:  [id number]   cc:   Sherril Croon, M.D.

## 2010-07-25 NOTE — Assessment & Plan Note (Signed)
OFFICE VISIT   [name] [name], [name] [name]  DOB:  [birth year]                                       06/03/2007  [id number]   The patient returns today for continued followup regarding severe  varicose veins.  She has a long history of swelling and discomfort in  the legs, particularly on the left side, and has had DVT in the past on  both legs, but has never had a hypercoagulable state diagnosed.  She has  a widely patent deep venous system on the left leg now by carotid duplex  exam, and gross reflux in the entire greater saphenous vein on the left,  including the saphenofemoral junction.  She has been wearing long-leg  elastic compression stockings with no improvement in her symptoms and  continues to have distal edema and discomfort.  Analgesics have not been  successful, nor has elevation.   EXAM:  Blood pressure is 158/87, heart rate is 90.  She has large bulbus  varicosities in the left thigh and calf medially, over the greater  saphenous system.  She does have 1 to 2+ distal edema.   Will proceed with precertification for laser ablation of the left  greater saphenous vein with multiple stab phlebectomies.  She has not  had an appointment with the hematologist yet for her hypercoagulable  workup, but that can be arranged following her laser ablation procedure.   Nelda Severe Kellie Simmering, M.D.  Electronically Signed   JDL/MEDQ  D:  06/03/2007  T:  06/04/2007  Job:  [id number]

## 2010-07-25 NOTE — Op Note (Signed)
NAMESHIRLEEN, SNARSKI NO.:  [id number]   MEDICAL RECORD NO.:  [id number]          PATIENT TYPE:  INP   LOCATION:  3172                         FACILITY:  [facility]   PHYSICIAN:  Leeroy Cha, M.D.   DATE OF BIRTH:  February 02, 1930   DATE OF PROCEDURE:  10/16/2006  DATE OF DISCHARGE:                               OPERATIVE REPORT   PREOPERATIVE DIAGNOSIS:  L3-L4 stenosis, status post 4-5 fusion.   POSTOPERATIVE DIAGNOSES:  L3-L4 stenosis, status post 4-5 fusion.   PROCEDURE:  Bilateral three laminectomy, posterolateral arthrodesis with  autograft and BMP.   SURGEON:  Leeroy Cha, M.D.   ASSISTANT:  Hosie Spangle, M.D.   CLINICAL HISTORY:  The patient in the past underwent fusion of L4-5.  Now she has been complaining of back pain, radiation to both legs  associated with weakness of __________.  X-rays show she has stenosis at  the level of 3-4.  Surgery was advised.  The risks were explained to her  and daughter including possibility of need for further surgery with  pedicle screws, infection, spondylolisthesis, CSF leak.   PROCEDURE:  Ms. Horton was taken to the OR and as a first stage, Dr.  Grandville Silos proceeded with removal of a lipoma of the left gluteal area.  From then on we scrubbed the area again with DuraPrep.  Then drapes were  applied.  Midline incision was made following the previous one.  Incision was carried out through the skin through a thick adipose tissue  down until we were able to find the pedicle screws and immediately  recognized the spinous process.  We went laterally until we were able to  see the junction and the facet and the transverse process.  Then with  the Leksell, we removed the spinal process of L3 and the lamina.  With  the drill we went laterally until we were able to decompress the  __________canal laterally.  Then the thick yellow ligament was also  excised.  With a 2 and 3 mm Kerrison punch we did foraminotomy to  decompress the L3 and L4 nerve root.  Then with the drill we removed the  periosteum of the facet of L3 and L2 as well as the proximal transverse  process.  Then a mix of BMP and autograft was used for arthrodesis.  Valsalva maneuver was negative.  Then the wound was closed with Vicryl  and Steri-Strips.           ______________________________  Leeroy Cha, M.D.     EB/MEDQ  D:  10/16/2006  T:  10/16/2006  Job:  [id number]

## 2010-07-28 NOTE — Op Note (Signed)
NAME:  [name] [name], [name] [name] NO.:  [id number]   MEDICAL RECORD NO.:  [id number]                    PATIENT TYPE:   LOCATION:                                       FACILITY:   PHYSICIAN:  Ninetta Lights, M.D.              DATE OF BIRTH:   DATE OF PROCEDURE:  10/23/2001  DATE OF DISCHARGE:                                 OPERATIVE REPORT   PREOPERATIVE DIAGNOSES:  1. Degenerative arthritis, left knee, with medial and lateral meniscal     tears.  2. Chondromalacia of the patella.   POSTOPERATIVE DIAGNOSES:  1. Degenerative arthritis, left knee, with medial and lateral meniscal     tears.  2. Chondromalacia of the patella.   PROCEDURES:  1. Left knee examination under anesthesia.  2. Arthroscopy with extensive partial medial and lateral meniscectomies.  3. Chondroplasty of medial and lateral femoral condyles.  More extensive     chondroplasty of patellofemoral joint.  4. Removal of chondral loose bodies.   SURGEON:  Ninetta Lights, M.D.   ASSISTANT:  Aaron Edelman D. Petrarca, P.A.-C.   ANESTHESIA:  General.   ESTIMATED BLOOD LOSS:  Minimal.   SPECIMENS:  None.   CULTURES:  None.   COMPLICATIONS:  None.   TOURNIQUET:  Not employed.   DESCRIPTION OF PROCEDURE:  The patient brought to the operating room and  after adequate anesthesia had been obtained, left knee examined.  Relatively  good motion with patellofemoral crepitus and stable ligaments.  Tourniquet  and leg holder applied, leg prepped and draped in the usual sterile fashion.  Three portals created, one superolateral, one each medial and lateral  parapatellar.  Inflow catheter introduced, knee distended, arthroscope  introduced, and the knee inspected.  Grade 3 and 4 changes throughout the  patellofemoral joint with exposed bone over the entire lateral half.  Chondroplasty to uneven surfaces with removal of chondral loose bodies.  Lateral tracking but no lateral tethering.  Hypertrophic  synovitis  throughout the knee debrided.  Cruciate ligaments intact.  Medial  compartment with extensive degenerative tearing, posterior half of the  medial meniscus, all saucerized out with baskets, tapered in smoothly with  the shaver, retaining a little bit of the rim in the back, tapered into  remaining meniscus.  Relatively diffuse grade 3 changes, relatively deep  over the entire weightbearing dome, medial femoral condyle, treated with  chondroplasty to a stable surface.  Not down to exposed bone, but very thin  in some areas.  Tibial plateau looked reasonably good.  Lateral compartment  revealed extensive grade 2 changes on the condyle, treated with  chondroplasty as well.  Not as much loss of cartilage there compared to the  medial side, but still at least half-thickness gone.  Marked degenerative  tearing, lateral meniscus circumferentially, saucerized out, tapered in  smoothly throughout, retaining about half the meniscus all the way around.  Entire knee examined  and no other significant findings appreciated.  Instruments and fluid removed.  Portals and knee injected with Marcaine.  Portals closed with 4-0 nylon.  Sterile compressive dressing applied.  Anesthesia reversed.  Brought to the recovery room.  Tolerated the surgery  well with no complications.                                               Ninetta Lights, M.D.    DFM/MEDQ  D:  10/23/2001  T:  10/25/2001  Job:  [id number]

## 2010-07-28 NOTE — Assessment & Plan Note (Signed)
[facility]                               PULMONARY OFFICE NOTE   NAME:[name], [name] THIGPEN                     MRN:          [id number]  DATE:10/03/2005                            DOB:          [birth year]    PULMONARY ALLERGY FOLLOW-UP:   PROBLEMS:  1.  Asthmatic bronchitis.  2.  Allergic rhinitis.  3.  Esophageal reflux.  4.  Diabetes with obesity.  5.  Remote deep vein thrombosis with pregnancy.   HISTORY:  She continues to give her own allergy vaccine at 1:10 with no  problems.  She has been staying in a lot this summer to avoid the heat and  humidity with no acute events.  We discussed her allergy vaccine goals,  duration and treatment options.  We also spent time again discussing policy  concerning administration away from a medical office, anaphylaxis and  epinephrine therapy.  She chooses to continue getting her injections at home  and has signed our waiver form, but we will make an effort to make sure that  family is in the home with her appropriately when she gets her shots.   MEDICATIONS:  1.  Pulmicort two puffs b.i.d.  2.  Singulair 10 mg.  3.  Celebrex.  4.  Norvasc 10 mg.  5.  Vytorin 10/30 mg.  6.  Calcium.  7.  Prilosec.  8.  Optivar.  9.  Allergy vaccine.  10. Benicar 40/25 mg.  11. Vitamin B12.  12. Albuterol rescue inhaler.  13. Claritin.  14. Metformin 500 mg.  15. Evista 60 mg.   Drug-intolerant to Demerol, aspirin and ibuprofen.   OBJECTIVE:  VITAL SIGNS:  Weight 245 pounds.  BP 146/82, pulse regular at  85, room air saturation 96%.  HEENT:  The tip of her nose is a little discolored from recent cancer  resection.  She is quite talkative.  Pharynx is clear.  LUNGS:  Clear to P&A.  CARDIAC:  Heart sounds regular without murmur, no edema.   IMPRESSION:  Allergic rhinitis and asthma, well-controlled.   PLAN:  Risk and safety discussion as above with refill of epinephrine  injector.  Schedule return 6 months,  earlier p.r.n.                                   Clinton D. Annamaria Boots, MD, FCCP, FACP   CDY/MedQ  DD:  10/03/2005  DT:  10/04/2005  Job #:  [id number]   cc:   Milford Cage. Laurann Montana, MD

## 2010-07-28 NOTE — Op Note (Signed)
[facility]  Patient:    [name] [name], [name] [name]                     MRN: [id number] Proc. Date: 04/25/99 Adm. Date:  [id number] Attending:  Minda Meo                           Operative Report  PREOPERATIVE DIAGNOSES: 1. Lumbar stenosis lumbar vertebrae-4/5. 2. Spondylolisthesis. 3. Degenerative disc disease. 4. Scoliosis.  POSTOPERATIVE DIAGNOSES: 1. Lumbar stenosis lumbar vertebrae-4/5. 2. Spondylolisthesis. 3. Degenerative disc disease. 4. Scoliosis.  OPERATION:  Bilateral L4/5 laminectomy, total discectomy, facetectomy, interbody fusion using allograft, pedicle screw from L4 to L5 with posterolateral fusion.   INSTRUMENTS:  Midas Rex and microscope.  SURGEON:  Zigmund Daniel. Joya Salm, M.D.  ASSISTANT:  Earleen Newport, M.D.  INDICATION:  Ms. Schiavo is a 77 year old female complaining of back pain with  radiation to both legs, right worse than left, associated with weakness.  She has failed with conservative treatments.  X-rays showed that she had stenosis between L4/5 up to the point that there was  almost no space for the dural sac.  She had spondylolisthesis with the scoliosis and the degenerative disc disease.  Surgery was advised.  Prior to surgery, she had a bone density study which showed that she might have  osteoporosis.  Several options were explained to the patient and to the family including one of the daughters who is a nurse about the possibility what we might encounter during surgery and what the alternative will be.  Nevertheless, the surgery was done as we were planning to do.  DESCRIPTION OF PROCEDURE:  The patient was taken to the operating room and she as positioned in a prone manner.  The back was prepped with Betadine.  A midline incision from L4 to S1 was made.  Muscles were retracted laterally. e went ahead and identified the L4/5 space and we removed the spinous processes of L4/5 as well as  the lamina.  Indeed, we found an area of narrowing with quite a bit of scar tissue compromising more of the right side at the level of L4/5 than the left.  Having done this, we went laterally and did a bilateral facetectomy.  Having done this, we entered the disc space and we did a total discectomy.  Foraminotomy with decompression of the L4 and L5 nerve roots were done.  Then after we did the total discectomy, we introduced interbody spreader.  We drilled the endplate and we removed the excess of soft tissue.  Then a bone graft dividing two of 9 mm by 21 mm was inserted bilaterally.  Then using bone from her spinous process as well as from the lamina, the middle of the distal space as well as laterally on both sides were filled up with autograft.  Lateral lumbar spine showed good position of the graft.  Then having done this, we identified the pedicular L4 and L5 on both sides.  We introduced the markers. Indeed the screws were in good position, although they were a little bit superior. Nevertheless, when we start drilling and to introduced the pedicle screw we aimed inferiorly.  X-rays showed good position of all four pedicle screws.  Then using the Synthes  equipment, we put the bar in between the pedicle screws.  Compression was done nd the cap of the fusion was finally positioned.  X-rays showed good position of  the pedicle screws.  Having done this and having good stability, we did drilling of the right transverse process of L4 and L5.  Then using autograft as well as allograft with bone matrix we filled up both places with the bone.  When we introduced the pedicle screw and we finally put the bar with compression to keep the bone graft in good position and solid.  Having done this, the area was irrigated.  A piece of fat was left to cover the epidural space.  Fentanyl was left in the epidural space also.  Then the wound was closed with Vicryl and Steri-Strips.   We used CellSaver but he amount of blood was less than 350 cc and there was no need to transfuse her. The patient did well and she was back to the recovery room. DD:  04/25/99 TD:  04/25/99 Job: [id number]

## 2010-07-28 NOTE — Op Note (Signed)
   NAME:  [name] [name], SCRIVENER NO.:  [id number]   MEDICAL RECORD NO.:  [id number]                   PATIENT TYPE:  AMB   LOCATION:                                       FACILITY:  [facility]   PHYSICIAN:  Ronald Lobo, M.D.                DATE OF BIRTH:  Jul 10, 1929   DATE OF PROCEDURE:  09/04/2002  DATE OF DISCHARGE:                                 OPERATIVE REPORT   PROCEDURE:  Colonoscopy with biopsy.   ENDOSCOPIST:  Ronald Lobo, M.D.   INDICATIONS FOR PROCEDURE:  Screening for colon cancer in a 77 year old  female.   FINDINGS:  Two small sessile cecal polyps.   DESCRIPTION OF PROCEDURE:  The nature, purpose and risks of the procedure  had been discussed with the patient who provided written consent.  Sedation:  Fentanyl 50 mcg and Versed 5 mg IV without arrhythmias or desaturation.   The Olympus adult videocolonoscope was advanced around the colon without  difficulty to the cecum; and, for a short distance, into a normal appearing  terminal ileum after which pull back was performed.   The terminal ileum had a normal appearance.  The cecum had a couple of small  sessile polyps measuring about 2 mm across, each removed by 1 or 2 cold  biopsies.  Pull back was then performed around the colon.  There was a  little bit of dark stool film coating the proximal colon, but it is not felt  that this would have obscured any significant lesions.  Overall, the prep  was very good and it is felt that all areas were adequately seen.   Apart from the small cecal polyps, this was a normal examination.  No large  polyps, cancer, colitis, vascular malformations or diverticular disease was  observed.  The patient tolerated the procedure well and there were no  apparent complications.   IMPRESSION:  Small cecal polyps.   PLAN:  Await pathology results.                                               Ronald Lobo, M.D.    RB/MEDQ  D:  09/04/2002  T:  09/05/2002   Job:  [id number]   cc:   Milford Cage. Laurann Montana, M.D.  [place] [place]  [place] [address]  Fax: [phone]

## 2010-07-28 NOTE — Assessment & Plan Note (Signed)
[facility]                             PULMONARY OFFICE NOTE   NAME:[name] [name], [name] [name]                     MRN:          [id number]  DATE:04/01/2006                            DOB:          July 09, 1929    PROBLEM:  1. Asthmatic bronchitis.  2. Allergic rhinitis.  3. Esophageal reflux.  4. Diabetes with obesity.  5. Remote deep venous thrombosis with pregnancy.   HISTORY:  A 45-month followup.  Had a re-do cataract lens implant and  resection of skin cancers.  Nasal congestion and headache very sensitive  to irritant odors almost any time of year.  Still occasional reflux  event, for which he will take extra Prilosec.  She is using nasal saline  effectively and has not had any purulent discharge or nosebleeds.  Asthma control has been good.  She continued allergy vaccine at 1 to 10,  has up to date Epi-Pen and is comfortable with this.  We discussed  duration of allergy vaccine, which she feels well controlled and does  not want to change.   MEDICATIONS:  1. Pulmicort 2 puffs b.i.d.  2. Singulair 10 mg.  3. Celebrex 200 mg.  4. Amlodipine.  5. Benicar 40/25.  6. Vytorin 10/30.  7. Prilosec 20 mg.  8. Vitamins.  9. Calcium.  10.Metformin XR 500 mg.  11.Epi-Pen.  12.Albuterol inhaler.   ALLERGIES:  DRUG INTOLERANT TO DEMEROL, ASPIRIN, IBUPROFEN.   OBJECTIVE:  VITAL SIGNS:  Weight 238 pounds, BP 150/80, pulse regular  85, room air saturation 100%. There was turbinate edema without polyps.  She has some creamer around the mouth.  HEENT:  Pharynx is clear.  LUNGS:  Clear.  HEART:  Regular without murmur, no edema.   IMPRESSION:  Asthma control is good.  Allergic rhinitis control is fair.  I think a component of her nasal symptoms is vasomotor rhinitis.   PLAN:  We discussed options and management, as well as environmental  precautions.  We refilled her allergy syringes, albuterol inhaler and  Pulmicort 2 puffs b.i.d.  Schedule return in 6  months, earlier p.r.n.     Clinton D. Annamaria Boots, MD, [facility], [place]  Electronically Signed    CDY/MedQ  DD: 04/01/2006  DT: 04/01/2006  Job #: [id number]   cc:   Milford Cage. Laurann Montana, M.D.

## 2010-07-28 NOTE — Op Note (Signed)
NAME:  [name] [name], [name] [name]                        ACCOUNT NO.:  [id number]   MEDICAL RECORD NO.:  [id number]                   PATIENT TYPE:  AMB   LOCATION:  [place]                                  FACILITY:  [facility]   PHYSICIAN:  Ninetta Lights, M.D.              DATE OF BIRTH:  [birth year]   DATE OF PROCEDURE:  08/18/2003  DATE OF DISCHARGE:                                 OPERATIVE REPORT   PREOPERATIVE DIAGNOSES:  Degenerative tearing medial and lateral meniscus,  right knee.  Underlying significant tricompartmental degenerative arthritis.   POSTOPERATIVE DIAGNOSES:  Degenerative tearing medial and lateral meniscus,  right knee.  Underlying significant tricompartmental degenerative arthritis.   OPERATION PERFORMED:  Right knee examination under anesthesia, arthroscopy,  chondroplasty all three compartments, removal of chondral loose bodies and  partial synovectomy.  Relatively extensive partial medial and lateral  meniscectomy.   SURGEON:  Ninetta Lights, M.D.   ASSISTANT:  Aaron Edelman D. Petrarca, P.A.-C.   ANESTHESIA:  General.   ESTIMATED BLOOD LOSS:  Minimal.   TOURNIQUET TIME:  Not employed.   SPECIMENS:  None.   CULTURES:  None.   COMPLICATIONS:  None.   DRESSING:  Soft compressive.   DESCRIPTION OF PROCEDURE:  The patient was brought to the operating room and  placed on the operating table in supine position.  After adequate anesthesia  had been obtained, leg holder applied.  Leg prepped and draped in the usual  sterile fashion.  Three portals were created, one superolateral, one each  medial and lateral parapatellar.  Inflow catheter introduced.  Knee  distended.  Arthroscope introduced, knee inspected.  Complex displaced  tearing posterior third to half medial meniscus saucerized out to a stable  rim, tapered in to remaining meniscus.  Circumferential complex tearing  lateral meniscus.  Saucerized out leaving about half the meniscus all the  way round at completion.   Grade 4 changes patellofemoral joint.  Some  remaining chondral flaps and loose bodies, all debrided.  Good tracking but  grade 4 on both sides.  Spurring in the notch but cruciate ligaments intact.  Medial compartment had some grade 2 changes not extensive.  Lateral  compartment grade 3 changes on the condyle treated with chondroplasty.  At  completion, the entire knee examined.  All synovitis, all loose fragments  removed.  All surfaces  made as smooth as possible.  Instruments and fluid removed.  Portals and  knee injected with Marcaine.  Portals closed with 4-0 nylon.  Sterile  compressive dressing applied.  Anesthesia reversed.  Brought to recovery  room.  Tolerated surgery well.  No complications.                                               Ninetta Lights, M.D.  DFM/MEDQ  D:  08/18/2003  T:  08/18/2003  Job:  [id number]

## 2010-08-14 ENCOUNTER — Telehealth: Payer: Self-pay | Admitting: Internal Medicine

## 2010-08-14 MED ORDER — MOMETASONE FUROATE 50 MCG/ACT NA SUSP: NASAL | Status: DC

## 2010-08-14 NOTE — Telephone Encounter (Signed)
Per CY-okay to give nasonex #1 1-2 puff each nostril daily at bedtime with year refills.

## 2010-08-14 NOTE — Telephone Encounter (Signed)
Rx sent pt aware 

## 2010-08-14 NOTE — Telephone Encounter (Signed)
Called, spoke with pt.  States the nasonex samples worked really well for her.  She is requesting rx for this.  [facility].  Dr. Annamaria Boots, pls advise if this is ok.   Note:  Pt states she will pick this up either tomorrow or Wednesday.

## 2010-11-22 ENCOUNTER — Ambulatory Visit (INDEPENDENT_AMBULATORY_CARE_PROVIDER_SITE_OTHER): Payer: Medicare Other

## 2010-11-22 DIAGNOSIS — J309 Allergic rhinitis, unspecified: Secondary | ICD-10-CM

## 2010-12-25 LAB — BASIC METABOLIC PANEL
CO2: 28
Calcium: 10.2
Glucose, Bld: 99
Potassium: 4.6
Sodium: 143

## 2010-12-25 LAB — CBC
HCT: 39.4
Hemoglobin: 13.1
MCHC: 33.3
RDW: 13.9

## 2010-12-27 ENCOUNTER — Ambulatory Visit (INDEPENDENT_AMBULATORY_CARE_PROVIDER_SITE_OTHER): Payer: Medicare Other | Admitting: Internal Medicine

## 2010-12-27 ENCOUNTER — Encounter: Payer: Self-pay | Admitting: Internal Medicine

## 2010-12-27 VITALS — BP 138/74 | HR 58 | Ht 65.0 in | Wt 234.2 lb

## 2010-12-27 DIAGNOSIS — J309 Allergic rhinitis, unspecified: Secondary | ICD-10-CM

## 2010-12-27 DIAGNOSIS — G4733 Obstructive sleep apnea (adult) (pediatric): Secondary | ICD-10-CM

## 2010-12-27 DIAGNOSIS — E669 Obesity, unspecified: Secondary | ICD-10-CM

## 2010-12-27 MED ORDER — BUDESONIDE 180 MCG/ACT IN AEPB: 1.0000 | INHALATION_SPRAY | Freq: Two times a day (BID) | RESPIRATORY_TRACT | Status: DC

## 2010-12-27 MED ORDER — ALBUTEROL SULFATE HFA 108 (90 BASE) MCG/ACT IN AERS: 2.0000 | INHALATION_SPRAY | RESPIRATORY_TRACT | Status: DC | PRN

## 2010-12-27 MED ORDER — EPINEPHRINE 0.15 MG/0.3ML IJ DEVI: 0.1500 mg | INTRAMUSCULAR | Status: DC | PRN

## 2010-12-27 NOTE — Progress Notes (Signed)
Patient ID: [name] [name], female    DOB: 09/27/1929, 77 y.o.   MRN: [id number]  HPI 06/27/10-80 yoF followed for asthma and allergic rhinitis, complicated by hx of DVT, hx lipomas and GERD. Last here 12/27/09. She was seen by NP here for acute bronchitis with good response, then had Norovirus GI. She had f/u MRI for known lipoma in neck for which she is supposed to have traction/ Dr Joya Salm.  Seasonal pollens causing some frontal headache, sneeze, itch and rhinorhea. Drip makes her cough w/o wheeze. Ventolin will stop the cough. She continues allergy vaccine. Continues Zyrtec and plans to try Claritin. Some epistaxis. Review of Systems See HPI  12/27/10- 80 yoF followed for asthma and allergic rhinitis, complicated by hx of DVT, hx lipomas, DM, DVT and GERD Dx'd w/ cervical spondylosis. Sinus pressure headache feels better since changed from a pill to a patch- Clonidine 0.1mg . Can't afford generic singulair. Continues allergy vaccine without problems. Says this was a rough late summer and early fall because of pollens" dry hot weather", but she feels much better now. She switched back to see her today is her preferred antihistamine. Her daughter is concerned about obstructive sleep apnea potentially contributing to difficulty managing blood pressure and diabetes. She would like a sleep study done. She's not sure if she snores but does admit daytime sleepiness.  ROS See HPI Constitutional:   No-   weight loss, night sweats, fevers, chills, fatigue, lassitude. HEENT:   No-  headaches, difficulty swallowing, tooth/dental problems, sore throat,       No-  sneezing, itching, ear ache, nasal congestion, post nasal drip,  CV:  No-   chest pain, orthopnea, PND, swelling in lower extremities, anasarca, dizziness, palpitations Resp: No-   shortness of breath with exertion or at rest.              No-   productive cough,  No non-productive cough,  No- coughing up of blood.              No-   change  in color of mucus.  No- wheezing.   Skin: No-   rash or lesions. GI:  No-   heartburn, indigestion, abdominal pain, nausea, vomiting, diarrhea,                 change in bowel habits, loss of appetite GU: No-   dysuria, change in color of urine, no urgency or frequency.  No- flank pain. MS:  No-   joint pain or swelling.  No- decreased range of motion.  No- back pain. Neuro-     nothing unusual Psych:  No- change in mood or affect. No depression or anxiety.  No memory loss.     Objective:   Physical Exam General- Alert, Oriented, Affect-appropriate, Distress- none acute; obese Skin- rash-none, lesions- none, excoriation- none Lymphadenopathy- none Head- atraumatic            Eyes- Gross vision intact, PERRLA, conjunctivae clear secretions            Ears- Hearing, canals-normal            Nose- Clear, no-Septal dev, mucus, polyps, erosion, perforation             Throat- Mallampati II , mucosa clear , drainage- none, tonsils- atrophic; mild chronic hoarseness Neck- flexible , trachea midline, no stridor , thyroid nl, carotid no bruit Chest - symmetrical excursion , unlabored  Heart/CV- RRR , no murmur , no gallop  , no rub, nl s1 s2                           - JVD- none , edema- none, stasis changes- none, varices- none           Lung- clear to P&A, wheeze- none, cough- none , dullness-none, rub- none           Chest wall-  Abd- tender-no, distended-no, bowel sounds-present, HSM- no Br/ Gen/ Rectal- Not done, not indicated Extrem- cyanosis- none, clubbing, none, atrophy- none, strength- nl Neuro- grossly intact to observation

## 2010-12-27 NOTE — Patient Instructions (Signed)
Order [facility]- NPSG split protocol    Dx OSA  Stop Singulair when you run out of present supply  Meds refilled- Epipen script was sent

## 2010-12-29 NOTE — Assessment & Plan Note (Signed)
She continues to believe her allergy vaccine is a help

## 2010-12-29 NOTE — Assessment & Plan Note (Signed)
She probably has obstructive sleep apnea to some degree, as well as obesity hypoventilation syndrome. We will get a sleep study as a way of evaluating these problems in the context of nighttime sleep. I discussed this procedure with her.

## 2011-01-15 ENCOUNTER — Ambulatory Visit (HOSPITAL_BASED_OUTPATIENT_CLINIC_OR_DEPARTMENT_OTHER): Payer: Medicare Other | Attending: Internal Medicine | Admitting: General Practice

## 2011-01-15 VITALS — Ht 65.0 in | Wt 229.0 lb

## 2011-01-15 DIAGNOSIS — G4733 Obstructive sleep apnea (adult) (pediatric): Secondary | ICD-10-CM

## 2011-01-20 NOTE — Procedures (Signed)
NAME:  MARIO, YOHN NO.:  [id number]  MEDICAL RECORD NO.:  [id number]          PATIENT TYPE:  OUT  LOCATION:  SLEEP CENTER                 FACILITY:  [facility]  PHYSICIAN:  Bentley Haralson D. Annamaria Boots, MD, FCCP, FACPDATE OF BIRTH:  [birth year]Oct-1929  DATE OF STUDY:  01/15/2011                           NOCTURNAL POLYSOMNOGRAM  REFERRING PHYSICIAN:  Aarti Mankowski D. Annamaria Boots, MD, FCCP, FACP  REFERRING PHYSICIAN:  Ashlee Player D. Thornton Dohrmann, MD, FCCP, FACP  INDICATION FOR STUDY:  Hypersomnia with sleep apnea.  EPWORTH SLEEPINESS SCORE:  3/24.  BMI 38.1, weight 229 pounds, height 65 inches, neck 14.5 inches.  HOME MEDICATIONS:  Charted and reviewed.  SLEEP ARCHITECTURE:  Total sleep time 323.5 minutes with sleep efficiency 78.3%.  Stage I was 10.4%, stage II 81.1%, stage III absent, REM 8.5% of total sleep time.  Sleep latency 10.5 minutes, REM latency 325 minutes, awake after sleep onset 79 minutes, arousal index 15.  BEDTIME MEDICATION:  Singulair, Vytorin, PreserVision/Lutein capsules, Os-Cal, Valium.  RESPIRATORY DATA:  Apnea/hypopnea index (AHI) 7.6 per hour.  A total of 41 events were scored including 13 obstructive apneas, 1 central apnea, and 27 hypopneas.  All events were associated with nonsupine sleep position and REM.  REM AHI 52.4 per hour.  RDI 16.7 per hour.  There were insufficient numbers of early events to qualify for application of split protocol CPAP titration on this study night.  OXYGEN DATA:  Moderately loud snoring with oxygen desaturation to a nadir of 87% and a mean oxygen saturation through the study of 93.5% on room air.  CARDIAC DATA:  Sinus rhythm with PACs.  MOVEMENT-PARASOMNIA:  No significant movement disturbance.  No bathroom trips.  IMPRESSIONS-RECOMMENDATIONS: 1. Some nonspecific awakening during the night, after Valium at     bedtime. 2. Mild obstructive sleep apnea/hypopnea syndrome, AHI 7.6 per hour     (the normal range for adults is between 0 and 5  respiratory events     per hour).  Moderately loud snoring with oxygen desaturation to a     nadir of 87% and a     mean oxygen saturation through the study of 93.5% on room air.  A     total of 0.1 minute was recorded through the total recording time     with saturation less than 88% on room air.     Locklan Canoy D. Annamaria Boots, MD, [facility], FACP Diplomate, Tax adviser of Sleep Medicine Electronically Signed    CDY/MEDQ  D:  01/20/2011 18:20:08  T:  01/20/2011 19:11:49  Job:  [id number]

## 2011-02-07 ENCOUNTER — Encounter: Payer: Self-pay | Admitting: Internal Medicine

## 2011-02-07 ENCOUNTER — Ambulatory Visit (INDEPENDENT_AMBULATORY_CARE_PROVIDER_SITE_OTHER): Payer: Medicare Other | Admitting: Internal Medicine

## 2011-02-07 VITALS — BP 132/82 | HR 57 | Ht 65.0 in | Wt 235.2 lb

## 2011-02-07 DIAGNOSIS — J209 Acute bronchitis, unspecified: Secondary | ICD-10-CM

## 2011-02-07 DIAGNOSIS — G4733 Obstructive sleep apnea (adult) (pediatric): Secondary | ICD-10-CM

## 2011-02-07 NOTE — Progress Notes (Signed)
Patient ID: [name] [name], female    DOB: 26-Nov-1929, 77 y.o.   MRN: [id number]  HPI 06/27/10-80 yoF followed for asthma and allergic rhinitis, complicated by hx of DVT, hx lipomas and GERD. Last here 12/27/09. She was seen by NP here for acute bronchitis with good response, then had Norovirus GI. She had f/u MRI for known lipoma in neck for which she is supposed to have traction/ Dr Joya Salm.  Seasonal pollens causing some frontal headache, sneeze, itch and rhinorhea. Drip makes her cough w/o wheeze. Ventolin will stop the cough. She continues allergy vaccine. Continues Zyrtec and plans to try Claritin. Some epistaxis. Review of Systems See HPI  12/27/10- 80 yoF followed for asthma and allergic rhinitis, complicated by hx of DVT, hx lipomas, DM, DVT and GERD Dx'd w/ cervical spondylosis. Sinus pressure headache feels better since changed from a pill to a patch- Clonidine 0.1mg . Can't afford generic singulair. Continues allergy vaccine without problems. Says this was a rough late summer and early fall because of pollens" dry hot weather", but she feels much better now. She switched back to see her today is her preferred antihistamine. Her daughter is concerned about obstructive sleep apnea potentially contributing to difficulty managing blood pressure and diabetes. She would like a sleep study done. She's not sure if she snores but does admit daytime sleepiness.  02/07/11- 80 yoF followed for asthma and allergic rhinitis, complicated by hx of DVT, hx lipomas, DM, DVT and GERD, OSA NPSG 01/15/11- confirmed mild OSA 7.6/hr. We discussed in the context of her weight and blood sugar.  She has been sleeping only on her sides. Her daughter manages a DME company providing CPAP and is the one that want to manage her with this treatment trial. Meanwhile she continues allergy vaccine without problem and feels it helps.  ROS See HPI Constitutional:   No-   weight loss, night sweats, fevers, chills,  fatigue, lassitude. HEENT:   No-  headaches, difficulty swallowing, tooth/dental problems, sore throat,       No-  sneezing, itching, ear ache, +nasal congestion, post nasal drip,  CV:  No-   chest pain, orthopnea, PND, swelling in lower extremities, anasarca, dizziness, palpitations Resp: No-   shortness of breath with exertion or at rest.              No-   productive cough,  No non-productive cough,  No- coughing up of blood.              No-   change in color of mucus.  No- wheezing.   Skin: No-   rash or lesions. GI:  No-   heartburn, indigestion, abdominal pain, nausea, vomiting, diarrhea,                 change in bowel habits, loss of appetite GU: No-   dysuria, change in color of urine, no urgency or frequency.  No- flank pain. MS:  No-   joint pain or swelling.  No- decreased range of motion.  No- back pain. Neuro-     nothing unusual Psych:  No- change in mood or affect. No depression or anxiety.  No memory loss.     Objective:   Physical Exam General- Alert, Oriented, Affect-appropriate, Distress- none acute; obese Skin- rash-none, lesions- none, excoriation- none Lymphadenopathy- none Head- atraumatic            Eyes- Gross vision intact, PERRLA, conjunctivae clear secretions  Ears- Hearing, canals-normal            Nose- Clear, no-Septal dev, mucus, polyps, erosion, perforation             Throat- Mallampati II , mucosa clear , drainage- none, tonsils- atrophic;  +mild chronic hoarseness Neck- flexible , trachea midline, no stridor , thyroid nl, carotid no bruit Chest - symmetrical excursion , unlabored           Heart/CV- RRR , no murmur , no gallop  , no rub, nl s1 s2                           - JVD- none , edema- none, stasis changes- none, varices- none           Lung- clear to P&A, wheeze- none, cough- none , dullness-none, rub- none           Chest wall-  Abd- tender-no, distended-no, bowel sounds-present, HSM- no Br/ Gen/ Rectal- Not done, not  indicated Extrem- cyanosis- none, clubbing, none, atrophy- none, strength- nl Neuro- resting tremor of chin

## 2011-02-07 NOTE — Patient Instructions (Signed)
Order- [facility] - [facility] -  New CPAP auto for pressure recommendation 5- 15 cwp x 2 weeks, mask of choice, humidifier     Dx OSA

## 2011-02-08 DIAGNOSIS — G4733 Obstructive sleep apnea (adult) (pediatric): Secondary | ICD-10-CM | POA: Insufficient documentation

## 2011-02-08 NOTE — Assessment & Plan Note (Signed)
Control currently is good. I coached her on reflux avoidance again.

## 2011-03-21 ENCOUNTER — Ambulatory Visit (INDEPENDENT_AMBULATORY_CARE_PROVIDER_SITE_OTHER): Payer: Medicare Other

## 2011-03-21 ENCOUNTER — Ambulatory Visit: Payer: Medicare Other | Admitting: Internal Medicine

## 2011-03-21 DIAGNOSIS — J309 Allergic rhinitis, unspecified: Secondary | ICD-10-CM

## 2011-04-10 ENCOUNTER — Encounter: Payer: Self-pay | Admitting: Internal Medicine

## 2011-04-10 ENCOUNTER — Ambulatory Visit (INDEPENDENT_AMBULATORY_CARE_PROVIDER_SITE_OTHER): Payer: Medicare Other | Admitting: Internal Medicine

## 2011-04-10 VITALS — BP 132/60 | HR 60 | Ht 65.0 in | Wt 235.4 lb

## 2011-04-10 DIAGNOSIS — G4733 Obstructive sleep apnea (adult) (pediatric): Secondary | ICD-10-CM

## 2011-04-10 DIAGNOSIS — J309 Allergic rhinitis, unspecified: Secondary | ICD-10-CM

## 2011-04-10 DIAGNOSIS — J45909 Unspecified asthma, uncomplicated: Secondary | ICD-10-CM

## 2011-04-10 NOTE — Assessment & Plan Note (Signed)
  Good compliance and control. We discussed comfort measures and goals. Plan- try fixed pressure 9.

## 2011-04-10 NOTE — Assessment & Plan Note (Signed)
Good compliance and control. Continue budesonide maintenance.

## 2011-04-10 NOTE — Progress Notes (Signed)
Patient ID: [name] [name], female    DOB: 1929/03/18, 78 y.o.   MRN: [id number]  HPI 06/27/10-80 yoF followed for asthma and allergic rhinitis, complicated by hx of DVT, hx lipomas and GERD. Last here 12/27/09. She was seen by NP here for acute bronchitis with good response, then had Norovirus GI. She had f/u MRI for known lipoma in neck for which she is supposed to have traction/ Dr Joya Salm.  Seasonal pollens causing some frontal headache, sneeze, itch and rhinorhea. Drip makes her cough w/o wheeze. Ventolin will stop the cough. She continues allergy vaccine. Continues Zyrtec and plans to try Claritin. Some epistaxis. Review of Systems See HPI  12/27/10- 80 yoF followed for asthma and allergic rhinitis, complicated by hx of DVT, hx lipomas, DM, DVT and GERD Dx'd w/ cervical spondylosis. Sinus pressure headache feels better since changed from a pill to a patch- Clonidine 0.1mg . Can't afford generic singulair. Continues allergy vaccine without problems. Says this was a rough late summer and early fall because of pollens" dry hot weather", but she feels much better now. She switched back to see her today is her preferred antihistamine. Her daughter is concerned about obstructive sleep apnea potentially contributing to difficulty managing blood pressure and diabetes. She would like a sleep study done. She's not sure if she snores but does admit daytime sleepiness.  02/07/11- 80 yoF followed for asthma and allergic rhinitis, complicated by hx of DVT, hx lipomas, DM, DVT and GERD, OSA NPSG 01/15/11- confirmed mild OSA 7.6/hr. We discussed in the context of her weight and blood sugar.  She has been sleeping only on her sides. Her daughter manages a DME company providing CPAP and is the one she wants to manage her with this treatment trial. Meanwhile she continues allergy vaccine without problem and feels it helps.  04/10/11- 80 yoF followed for asthma and allergic rhinitis, complicated by hx of DVT, hx  lipomas, DM, DVT and GERD, OSA Doing very well with CPAP Auto- daughter [name] works for [employer] and has done a good job of helping get started. Nasal pillows mask. Wears all night, every night. Wakes only once 2AM bathroom, then right back to sleep. More alert in daytime.  Download confirms good compliance- suggests 9 cwp. Asthma control very good. No wheeze or cough, little need for rescue meds as she continues budesonide. Nasal congestion improved w/ humidifier adjustment on CPAP- no pollen rhinitis, itch or sneeze. Continues allergy vaccine.   ROS See HPI Constitutional:   No-   weight loss, night sweats, fevers, chills, fatigue, lassitude. HEENT:   No-  headaches, difficulty swallowing, tooth/dental problems, sore throat,       No-  sneezing, itching, ear ache, +nasal congestion, post nasal drip,  CV:  No-   chest pain, orthopnea, PND, swelling in lower extremities, anasarca, dizziness, palpitations Resp: No-   shortness of breath with exertion or at rest.              No-   productive cough,  No non-productive cough,  No- coughing up of blood.              No-   change in color of mucus.  No- wheezing.   Skin: No-   rash or lesions. GI:  No-   heartburn, indigestion, abdominal pain, nausea, vomiting, diarrhea,                 change in bowel habits, loss of appetite GU:  MS:  No-  joint pain or swelling.  No- decreased range of motion.  No- back pain. Neuro-     nothing unusual Psych:  No- change in mood or affect. No depression or anxiety.  No memory loss.     Objective:   Physical Exam General- Alert, Oriented, Affect-appropriate, Distress- none acute; obese Skin- rash-none, lesions- none, excoriation- none Lymphadenopathy- none Head- atraumatic            Eyes- Gross vision intact, PERRLA, conjunctivae clear secretions            Ears- Hearing, canals-normal            Nose- Clear, no-Septal dev, mucus, polyps, erosion, perforation             Throat- Mallampati III ,  mucosa clear , drainage- none, tonsils- atrophic;  +mild chronic hoarseness Neck- flexible , trachea midline, no stridor , thyroid nl, carotid no bruit Chest - symmetrical excursion , unlabored           Heart/CV- RRR , no murmur , no gallop  , no rub, nl s1 s2                           - JVD- none , edema- none, stasis changes- none, varices- none           Lung- clear to P&A, wheeze- none, cough- none , dullness-none, rub- none           Chest wall-  Abd- Br/ Gen/ Rectal- Not done, not indicated Extrem- cyanosis- none, clubbing, none, atrophy- none, strength- nl Neuro- resting tremor of chin

## 2011-04-10 NOTE — Patient Instructions (Signed)
Order- DME/ Sleep Med-  Set CPAP fixed 9 cwp   Dx OSA     Please call if you don't like this pressure change so we can change it.

## 2011-04-10 NOTE — Assessment & Plan Note (Signed)
Continues vaccine. CPAP filter should help [place] some as Spring pollen season comes in.

## 2011-04-17 ENCOUNTER — Telehealth: Payer: Self-pay | Admitting: Internal Medicine

## 2011-04-17 ENCOUNTER — Encounter: Payer: Self-pay | Admitting: Internal Medicine

## 2011-04-17 DIAGNOSIS — G4733 Obstructive sleep apnea (adult) (pediatric): Secondary | ICD-10-CM

## 2011-04-17 NOTE — Telephone Encounter (Signed)
Order has been placed and pt is aware. Nothing further was needed

## 2011-04-17 NOTE — Telephone Encounter (Signed)
Order- DME- reduce CPAP to 8 cwp- patient request. DX OSA

## 2011-04-17 NOTE — Telephone Encounter (Signed)
I spoke with pt and she she wants her cpap pressure lowered. Pt states it keeps her awake at night and it's a lot of air blowing through the cpap. She states the mask is blowing off her face at night. Pt is wanting her machine to be turned down to 7 or 8. Please advise Dr. Annamaria Boots, thanks

## 2011-04-18 ENCOUNTER — Telehealth: Payer: Self-pay | Admitting: Internal Medicine

## 2011-04-18 DIAGNOSIS — G4733 Obstructive sleep apnea (adult) (pediatric): Secondary | ICD-10-CM

## 2011-04-18 NOTE — Telephone Encounter (Signed)
Order- DME/[facility]- change CPAP to AutoSet 5-15 per request.

## 2011-04-18 NOTE — Telephone Encounter (Signed)
I spoke with daughter and she states pt is doing well on a set pressure, she is having black eyes (believes this is due to being on blood thinners), not sleeping well. The daughter is want pt to be set on a auto mode bc she states pt does better on this. She states sleep medicine did receive the order to decrease her cpap to 8.  Please advise Dr. Annamaria Boots, thanks

## 2011-04-18 NOTE — Telephone Encounter (Signed)
New order sent to [facility] and [facility] is aware.

## 2011-05-28 ENCOUNTER — Other Ambulatory Visit: Payer: Self-pay | Admitting: Internal Medicine

## 2011-05-28 DIAGNOSIS — Z1231 Encounter for screening mammogram for malignant neoplasm of breast: Secondary | ICD-10-CM

## 2011-06-14 ENCOUNTER — Encounter: Payer: Self-pay | Admitting: Internal Medicine

## 2011-07-09 ENCOUNTER — Ambulatory Visit
Admission: RE | Admit: 2011-07-09 | Discharge: 2011-07-09 | Disposition: A | Discharge status: Home / Self Care | Payer: Medicare Other | Source: Ambulatory Visit | Attending: Internal Medicine | Admitting: Internal Medicine

## 2011-07-09 DIAGNOSIS — Z1231 Encounter for screening mammogram for malignant neoplasm of breast: Secondary | ICD-10-CM

## 2011-07-16 ENCOUNTER — Ambulatory Visit (INDEPENDENT_AMBULATORY_CARE_PROVIDER_SITE_OTHER): Payer: Medicare Other

## 2011-07-16 DIAGNOSIS — J309 Allergic rhinitis, unspecified: Secondary | ICD-10-CM

## 2011-08-08 ENCOUNTER — Ambulatory Visit (INDEPENDENT_AMBULATORY_CARE_PROVIDER_SITE_OTHER): Payer: Medicare Other | Admitting: Internal Medicine

## 2011-08-08 ENCOUNTER — Encounter: Payer: Self-pay | Admitting: Internal Medicine

## 2011-08-08 VITALS — BP 130/72 | HR 62 | Ht 65.0 in | Wt 238.6 lb

## 2011-08-08 DIAGNOSIS — G4733 Obstructive sleep apnea (adult) (pediatric): Secondary | ICD-10-CM

## 2011-08-08 DIAGNOSIS — J302 Other seasonal allergic rhinitis: Secondary | ICD-10-CM

## 2011-08-08 DIAGNOSIS — J309 Allergic rhinitis, unspecified: Secondary | ICD-10-CM

## 2011-08-08 NOTE — Patient Instructions (Signed)
We will leave CPAP at 8  Continue allergy vaccine  Ok to use otc antihistamines like allegra or claritin if needed for drainage, Ok to try the mucinex if needed to thin mucus.  Please call as needed

## 2011-08-08 NOTE — Progress Notes (Signed)
Patient ID: [name] [name], female    DOB: [birth year], 78 y.o.   MRN: [id number]  HPI 06/27/10-80 yoF followed for asthma and allergic rhinitis, complicated by hx of DVT, hx lipomas and GERD. Last here 12/27/09. She was seen by NP here for acute bronchitis with good response, then had Norovirus GI. She had f/u MRI for known lipoma in neck for which she is supposed to have traction/ Dr Joya Salm.  Seasonal pollens causing some frontal headache, sneeze, itch and rhinorhea. Drip makes her cough w/o wheeze. Ventolin will stop the cough. She continues allergy vaccine. Continues Zyrtec and plans to try Claritin. Some epistaxis. Review of Systems See HPI  12/27/10- 80 yoF followed for asthma and allergic rhinitis, complicated by hx of DVT, hx lipomas, DM, DVT and GERD Dx'd w/ cervical spondylosis. Sinus pressure headache feels better since changed from a pill to a patch- Clonidine 0.1mg . Can't afford generic singulair. Continues allergy vaccine without problems. Says this was a rough late summer and early fall because of pollens" dry hot weather", but she feels much better now. She switched back to see her today is her preferred antihistamine. Her daughter is concerned about obstructive sleep apnea potentially contributing to difficulty managing blood pressure and diabetes. She would like a sleep study done. She's not sure if she snores but does admit daytime sleepiness.  02/07/11- 80 yoF followed for asthma and allergic rhinitis, complicated by hx of DVT, hx lipomas, DM, DVT and GERD, OSA NPSG 01/15/11- confirmed mild OSA 7.6/hr. We discussed in the context of her weight and blood sugar.  She has been sleeping only on her sides. Her daughter manages a DME company providing CPAP and is the one she wants to manage her with this treatment trial. Meanwhile she continues allergy vaccine without problem and feels it helps.  04/10/11- 80 yoF followed for asthma and allergic rhinitis, complicated by hx of DVT, hx  lipomas, DM, DVT and GERD, OSA Doing very well with CPAP Auto- daughter [name] works for [employer] and has done a good job of helping get started. Nasal pillows mask. Wears all night, every night. Wakes only once 2AM bathroom, then right back to sleep. More alert in daytime.  Download confirms good compliance- suggests 9 cwp. Asthma control very good. No wheeze or cough, little need for rescue meds as she continues budesonide. Nasal congestion improved w/ humidifier adjustment on CPAP- no pollen rhinitis, itch or sneeze. Continues allergy vaccine.   08/08/11- 7 yoF followed for asthma and allergic rhinitis, complicated by hx of DVT, hx lipomas, DM, and GERD, OSA Denies any SOB, wheezing, cough, or congestion;still on vaccine, Uses CPAP every night for approx 9 hours  Continues to do well with her allergy vaccine at 1:10. But did notice increased sneeze and postnasal drainage this spring so she stayed indoors.  Had a good winter. She remains on chronic Coumadin. CPAP download indicates excellent compliance and control of 8 CWP. Higher pressure was too much. She has been sleeping well.  ROS-See HPI Constitutional:   No-   weight loss, night sweats, fevers, chills, fatigue, lassitude. HEENT:   No-  headaches, difficulty swallowing, tooth/dental problems, sore throat,       +  sneezing, itching, ear ache, +nasal congestion, post nasal drip,  CV:  No-   chest pain, orthopnea, PND, swelling in lower extremities, anasarca, dizziness, palpitations Resp: No-   shortness of breath with exertion or at rest.  No-   productive cough,  No non-productive cough,  No- coughing up of blood.              No-   change in color of mucus.  No- wheezing.   Skin: No-   rash or lesions. GI:  No-   heartburn, indigestion, abdominal pain, nausea, vomiting,  GU:  MS:  No-   joint pain or swelling.   Neuro-     nothing unusual Psych:  No- change in mood or affect. No depression or anxiety.  No memory  loss.     Objective:   Physical Exam General- Alert, Oriented, Affect-appropriate, Distress- none acute; obese Skin- rash-none, lesions- none, excoriation- none Lymphadenopathy- none Head- atraumatic            Eyes- Gross vision intact, PERRLA, conjunctivae clear secretions            Ears- Hearing, canals-normal            Nose- minimal turbinate edema, no-Septal dev, mucus, polyps, erosion, perforation             Throat- Mallampati III , mucosa clear , drainage- none, tonsils- atrophic;  +mild chronic hoarseness Neck- flexible , trachea midline, no stridor , thyroid nl, carotid no bruit Chest - symmetrical excursion , unlabored           Heart/CV- RRR , no murmur , no gallop  , no rub, nl s1 s2                           - JVD- none , edema- none, stasis changes- none, varices- none           Lung- clear to P&A, wheeze- none, cough- none , dullness-none, rub- none           Chest wall-  Abd- Br/ Gen/ Rectal- Not done, not indicated Extrem- cyanosis- none, clubbing, none, atrophy- none, strength- nl. + varices at ankles Neuro- +resting tremor of chin

## 2011-08-12 NOTE — Assessment & Plan Note (Signed)
Download indicates excellent compliance and control with a pressure setting of 8. She is being very well supervised by her daughter, who is her CPAP DM E. support.

## 2011-08-12 NOTE — Assessment & Plan Note (Signed)
We discussed the expectations for allergy vaccine and use of antihistamines/nasal steroid sprays. The spring pollen season is almost over, improvement coincident with the change of season will give useful information. We will also want to see how she does in the fall. It may be appropriate either to stop vaccine for observation, or retest.

## 2011-09-03 ENCOUNTER — Other Ambulatory Visit: Payer: Self-pay | Admitting: Dermatology

## 2011-11-05 ENCOUNTER — Ambulatory Visit (INDEPENDENT_AMBULATORY_CARE_PROVIDER_SITE_OTHER): Payer: Medicare Other

## 2011-11-05 DIAGNOSIS — J309 Allergic rhinitis, unspecified: Secondary | ICD-10-CM

## 2011-11-26 IMAGING — RF DG MYELOGRAM LUMBAR
13 of 16 series · 13 of 16 positions shown · IV contrast (omnipaque)
Comparison: Multiple priors, most recent MR from 04/04/2009.

MYELOGRAM INJECTION
TECHNIQUE: Informed consent was obtained from the patient prior to
the procedure, including potential complications of headache,
allergy, infection and pain. Specific instructions were given
regarding 24 hour bedrest post procedure to prevent post-LP
headache.  A timeout procedure was performed.  With the patient
prone, the lower back was prepped with Betadine.  1% Lidocaine was
used for local anesthesia.  Lumbar puncture was performed by the
radiologist at the L5-S1 level using a 22 gauge needle with return
of clear CSF.  15 cc of Omnipaque 180 was injected into the
subarachnoid space .
CLINICAL DATA: Previous lumbar fusion and left L3-4 decompressive
laminectomy.  Previous dorsal lumbar lipoma excision.  Progressive
bilateral lower extremity pain and weakness.
TECHNIQUE: Multidetector CT imaging of the lumbar spine was
performed following myelography.  Multiplanar CT image
reconstructions were also generated.

[Series 1: myelogram  white · 1 of 1 slices shown (1 of 13)]
[im 1/1]
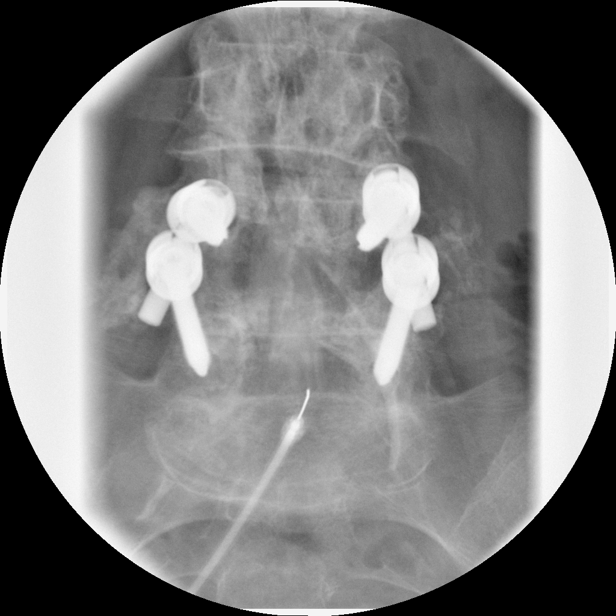

[Series 2: myelogram  white · 1 of 1 slices shown (2 of 13)]
[im 1/1]
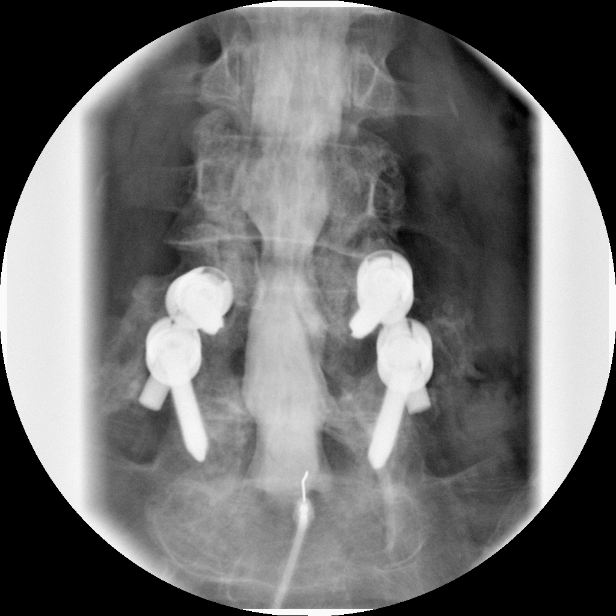

[Series 4: myelogram  white · 1 of 1 slices shown (3 of 13)]
[im 1/1]
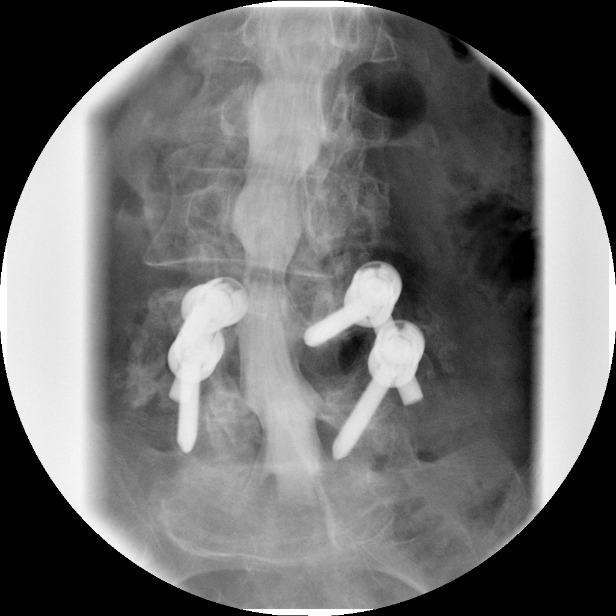

[Series 5: myelogram  white · 1 of 1 slices shown (4 of 13)]
[im 1/1]
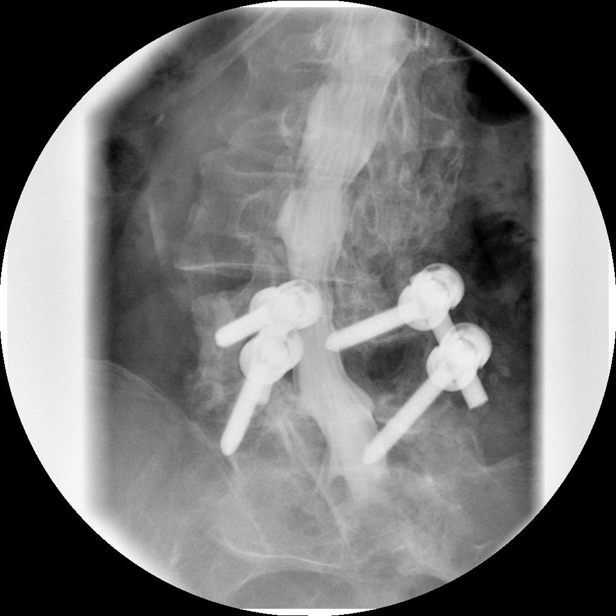

[Series 6: myelogram  white · 1 of 1 slices shown (5 of 13)]
[im 1/1]
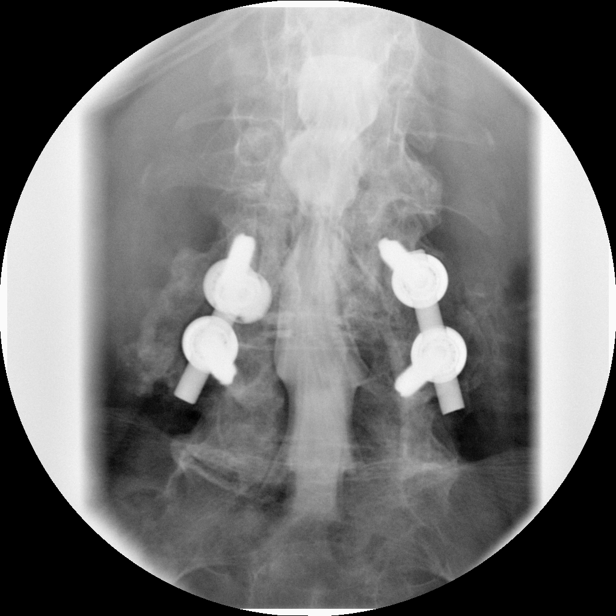

[Series 7: myelogram  white · 1 of 1 slices shown (6 of 13)]
[im 1/1]
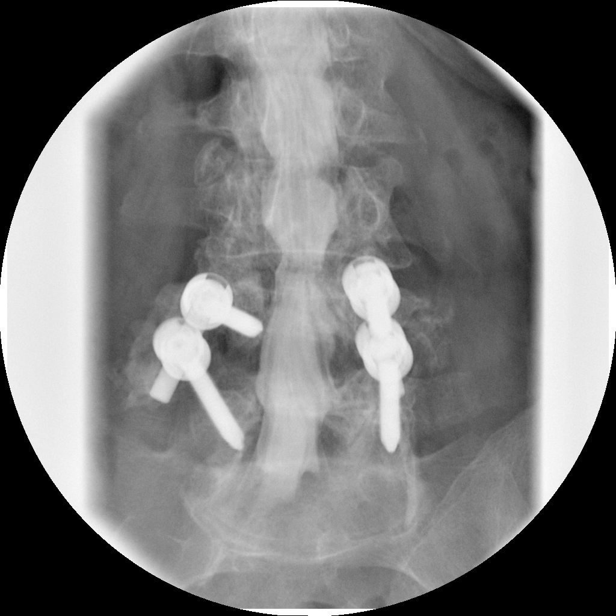

[Series 9: myelogram  white · 1 of 1 slices shown (7 of 13)]
[im 1/1]
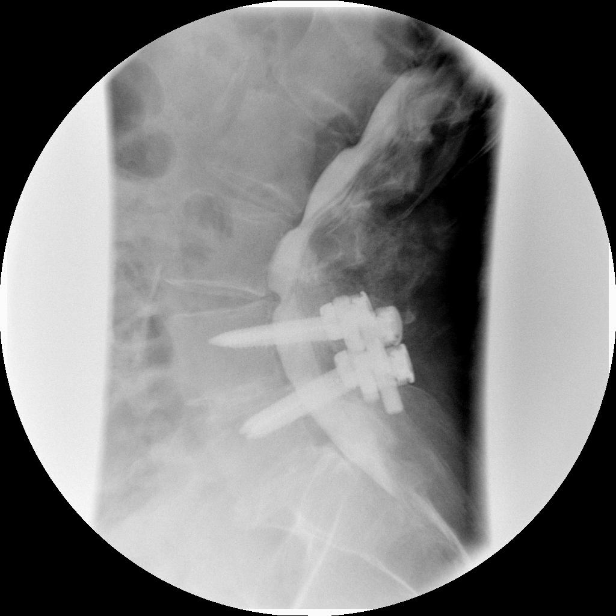

[Series 10: myelogram  white · 1 of 1 slices shown (8 of 13)]
[im 1/1]
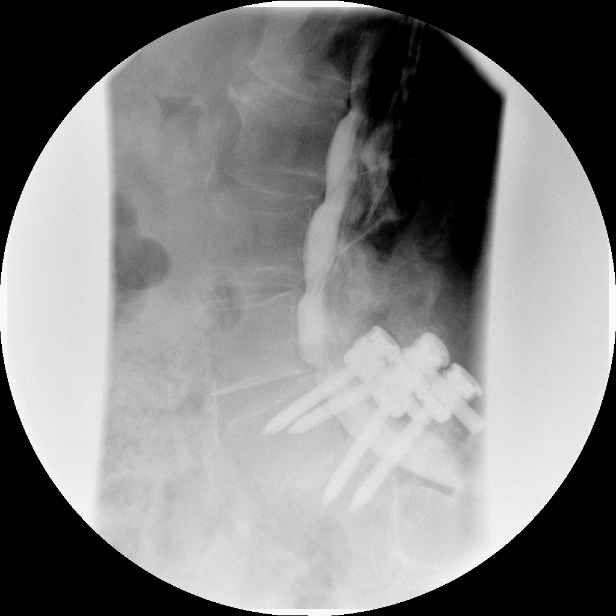

[Series 11: myelogram  white · 1 of 1 slices shown (9 of 13)]
[im 1/1]
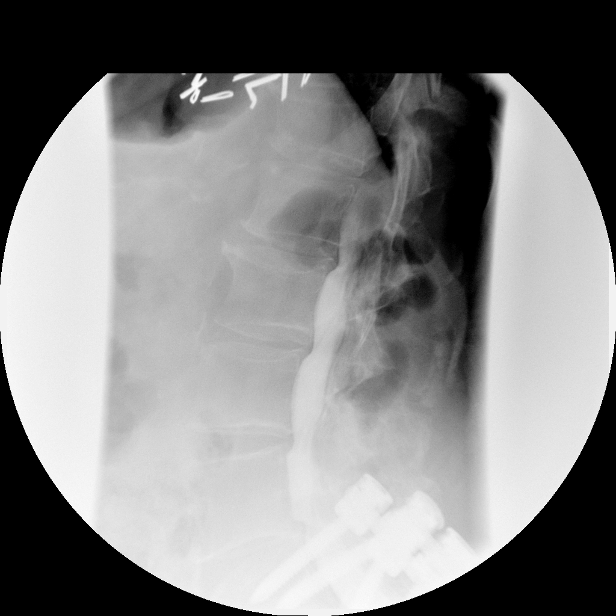

[Series 12: myelogram  white · 1 of 1 slices shown (10 of 13)]
[im 1/1]
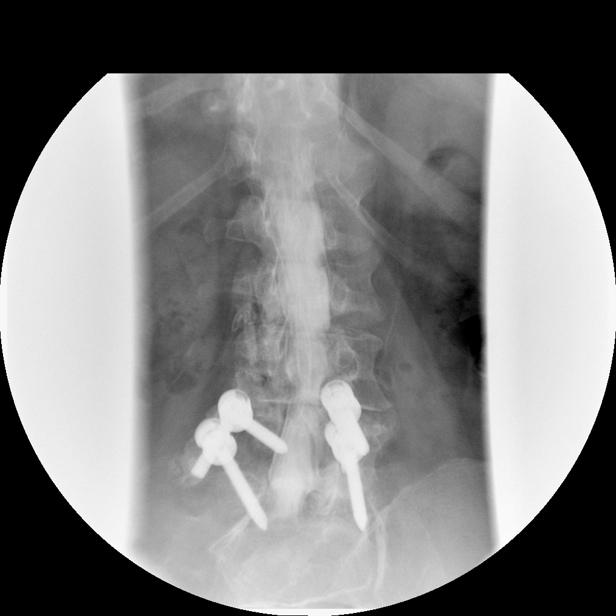

[Series 13: myelogram  white · 1 of 1 slices shown (11 of 13)]
[im 1/1]
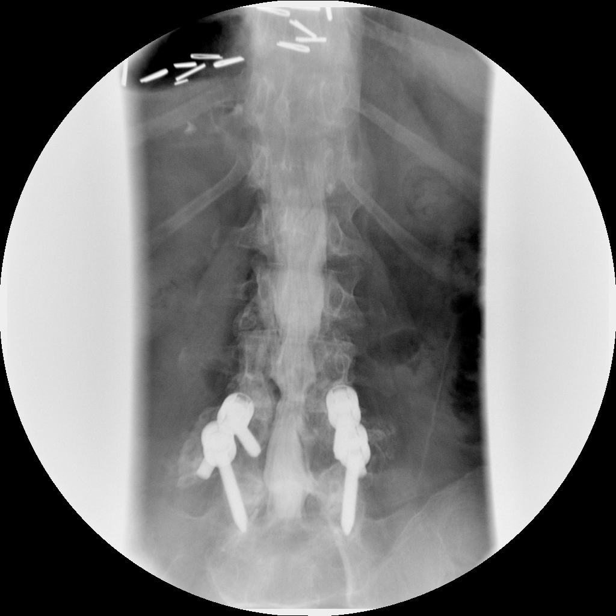

[Series 15: myelogram  white · 1 of 1 slices shown (12 of 13)]
[im 1/1]
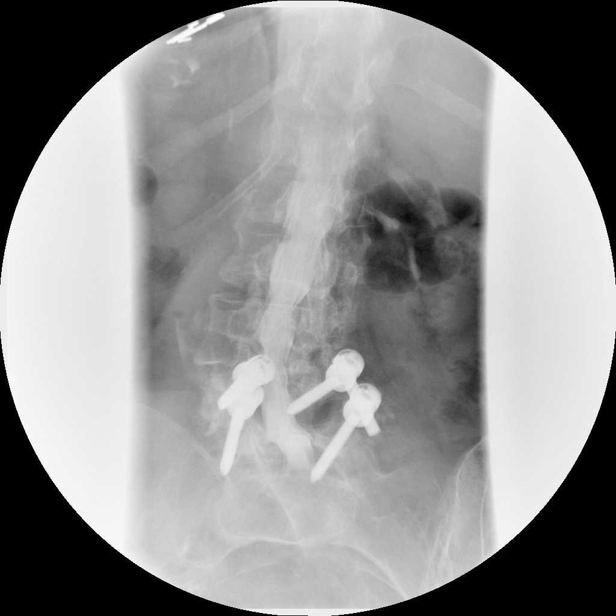

[Series 16: myelogram  white · 1 of 1 slices shown (13 of 13)]
[im 1/1]
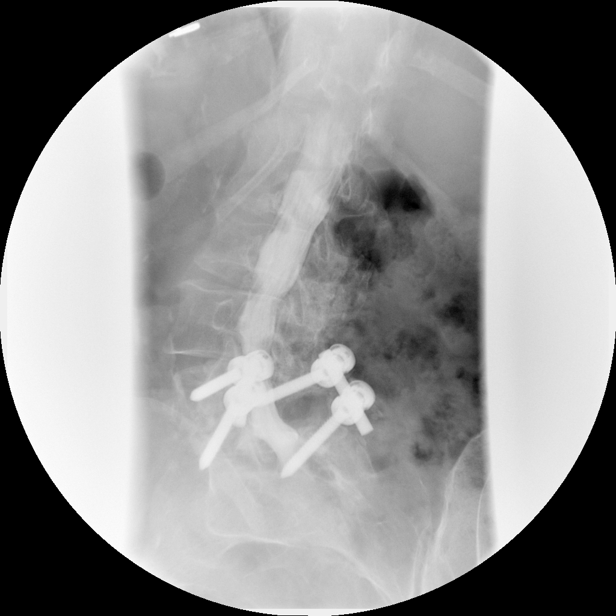

[13 of 16 positions shown; findings below may reference images not displayed]

IMPRESSION: Successful injection of  intrathecal contrast for myelography.

MYELOGRAM LUMBAR
FINDINGS: Good opacification of the lumbar subarachnoid space.
Adjacent segment stenosis at L3-4 greater than L2-3 with bilateral
L4 greater than bilateral L3 nerve root encroachment.  Hardware
intact.  Fusion is solid.  No L5-S1 disc pathology.

With the patient recumbent for myelography, there is minimal 3 mm
anterolisthesis of L3 on L4.  With the patient standing in neutral,
this increases to 4 mm. In extension, this decreases to 3 mm.  In
flexion, this increases to 5 mm. There is associated disc pathology
at L3-4 level ventrally. There is advanced disc space narrowing at
L5-S1 with probable fusion.  There is mild annular bulging at L2-3
and L1-2.

Fluoroscopy Time: 1.29 minutes
IMPRESSION: As above

CT MYELOGRAPHY LUMBAR SPINE
FINDINGS: No prevertebral or paraspinous masses.  Nonaneurysmal
atherosclerotic calcification of the aorta.

L1-2: Mild bulge.  No stenosis or disc protrusion.

L2-3: Mild bulge.  Moderate posterior hypertrophy.  Mild lateral
recess encroachment with left greater than right L3 nerve root
effacement.

L3-4: Previous decompressive laminectomy on the left.  Advanced
posterior element hypertrophy affecting facets and ligamentum
flava.  Mild annular bulging without subluxation.  Shallow central
protrusion.  Left greater than right L4 nerve root encroachment in
the canal.  Severe left and moderate right multifactorial foraminal
narrowing affecting the L3 nerve roots.

L4-5: Solid fusion.  No residual compression.

L5-S1: Disc space narrowing with functional fusion across the
interspace.  Wide posterior decompression.  Mild facet arthropathy.
No S1 nerve root crash in the canal.  Left greater than right bony
overgrowth causing neural foraminal narrowing potentially affects
the left L5 nerve root.
IMPRESSION: Solid fusion L4-5 without residual compression.

Adjacent segment disease at L3-4 with dynamic instability, with
subluxation increasing to 5 mm in upright flexion; shallow ventral
disc protrusion is observed; in addition to moderate stenosis and
lateral recess encroachment, there is severe left greater than
right foraminal narrowing at this level.

Mild multifactorial lateral recess encroachment at L2-3 affecting
the L3 nerve roots, left greater than right.

Severe disc space narrowing L5-S1, with probably significant left
L5 nerve root encroachment in the foramen.

## 2012-02-08 ENCOUNTER — Ambulatory Visit (INDEPENDENT_AMBULATORY_CARE_PROVIDER_SITE_OTHER): Payer: Medicare Other | Admitting: Internal Medicine

## 2012-02-08 ENCOUNTER — Encounter: Payer: Self-pay | Admitting: Internal Medicine

## 2012-02-08 VITALS — BP 130/78 | HR 63 | Ht 64.0 in | Wt 243.8 lb

## 2012-02-08 DIAGNOSIS — J45998 Other asthma: Secondary | ICD-10-CM

## 2012-02-08 DIAGNOSIS — J3089 Other allergic rhinitis: Secondary | ICD-10-CM

## 2012-02-08 DIAGNOSIS — J45909 Unspecified asthma, uncomplicated: Secondary | ICD-10-CM

## 2012-02-08 DIAGNOSIS — J309 Allergic rhinitis, unspecified: Secondary | ICD-10-CM

## 2012-02-08 DIAGNOSIS — G4733 Obstructive sleep apnea (adult) (pediatric): Secondary | ICD-10-CM

## 2012-02-08 MED ORDER — ALBUTEROL SULFATE HFA 108 (90 BASE) MCG/ACT IN AERS: 2.0000 | INHALATION_SPRAY | RESPIRATORY_TRACT | Status: DC | PRN

## 2012-02-08 MED ORDER — BUDESONIDE 180 MCG/ACT IN AEPB: 1.0000 | INHALATION_SPRAY | Freq: Two times a day (BID) | RESPIRATORY_TRACT | Status: DC

## 2012-02-08 MED ORDER — EPINEPHRINE 0.15 MG/0.3ML IJ DEVI: 0.1500 mg | INTRAMUSCULAR | Status: DC | PRN

## 2012-02-08 NOTE — Patient Instructions (Addendum)
We can continue CPAP 8/ [facility]  We can continue Allergy vaccine 1:10  Scripts to refill Epipen, Ventolin and Pulmicort were sent.   Please call as needed

## 2012-02-08 NOTE — Progress Notes (Signed)
Patient ID: [name] [name], female    DOB: [birth year], 78 y.o.   MRN: [id number]  HPI 06/27/10-78 yoF followed for asthma and allergic rhinitis, complicated by hx of DVT, hx lipomas and GERD. Last here 12/27/09. She was seen by NP here for acute bronchitis with good response, then had Norovirus GI. She had f/u MRI for known lipoma in neck for which she is supposed to have traction/ Dr Joya Salm.  Seasonal pollens causing some frontal headache, sneeze, itch and rhinorhea. Drip makes her cough w/o wheeze. Ventolin will stop the cough. She continues allergy vaccine. Continues Zyrtec and plans to try Claritin. Some epistaxis. Review of Systems See HPI  12/27/10- 78 yoF followed for asthma and allergic rhinitis, complicated by hx of DVT, hx lipomas, DM, DVT and GERD Dx'd w/ cervical spondylosis. Sinus pressure headache feels better since changed from a pill to a patch- Clonidine 0.1mg . Can't afford generic singulair. Continues allergy vaccine without problems. Says this was a rough late summer and early fall because of pollens" dry hot weather", but she feels much better now. She switched back to see her today is her preferred antihistamine. Her daughter is concerned about obstructive sleep apnea potentially contributing to difficulty managing blood pressure and diabetes. She would like a sleep study done. She's not sure if she snores but does admit daytime sleepiness.  02/07/11- 78 yoF followed for asthma and allergic rhinitis, complicated by hx of DVT, hx lipomas, DM, DVT and GERD, OSA NPSG 01/15/11- confirmed mild OSA 7.6/hr. We discussed in the context of her weight and blood sugar.  She has been sleeping only on her sides. Her daughter manages a DME company providing CPAP and is the one she wants to manage her with this treatment trial. Meanwhile she continues allergy vaccine without problem and feels it helps.  04/10/11- 78 yoF followed for asthma and allergic rhinitis, complicated by hx of DVT, hx  lipomas, DM, DVT and GERD, OSA Doing very well with CPAP Auto- daughter [name] [name] works for [employer] and has done a good job of helping get started. Nasal pillows mask. Wears all night, every night. Wakes only once 2AM bathroom, then right back to sleep. More alert in daytime.  Download confirms good compliance- suggests 9 cwp. Asthma control very good. No wheeze or cough, little need for rescue meds as she continues budesonide. Nasal congestion improved w/ humidifier adjustment on CPAP- no pollen rhinitis, itch or sneeze. Continues allergy vaccine.   08/08/11- 78 yoF followed for asthma and allergic rhinitis, complicated by hx of DVT, hx lipomas, DM, and GERD, OSA Denies any SOB, wheezing, cough, or congestion;still on vaccine, Uses CPAP every night for approx 9 hours  Continues to do well with her allergy vaccine at 1:10. But did notice increased sneeze and postnasal drainage this spring so she stayed indoors.  Had a good winter. She remains on chronic Coumadin. CPAP download indicates excellent compliance and control of 8 CWP. Higher pressure was too much. She has been sleeping well.  02/08/12- 78 yoF followed for asthma and allergic rhinitis, complicated by hx of DVT, hx lipomas, DM, and GERD, OSA Follows for: pt reports breathing is doing well; still on allergy vaccine 1:10 GO. She is satisfied that this helps her still  Pt reports wearing CPAP 8/ [facility] (daughter's company) approx 9 hrs per night--states mask is working well. She was recently put on insulin which she blames for rash around the neck and face. I asked her to speak with  Dr. Brigitte Pulse about that.  ROS-See HPI Constitutional:   No-   weight loss, night sweats, fevers, chills, fatigue, lassitude. HEENT:   No-  headaches, difficulty swallowing, tooth/dental problems, sore throat,       Little sneezing, itching, ear ache, nasal congestion, post nasal drip,  CV:  No-   chest pain, orthopnea, PND, swelling in lower extremities,  anasarca, dizziness, palpitations Resp: No-   shortness of breath with exertion or at rest.              No-   productive cough,  No non-productive cough,  No- coughing up of blood.              No-   change in color of mucus.  No- wheezing.   Skin: No-   rash or lesions. GI:  No-   heartburn, indigestion, abdominal pain, nausea, vomiting,  GU:  MS:  No-   joint pain or swelling.   Neuro-     nothing unusual Psych:  No- change in mood or affect. No depression or anxiety.  No memory loss.     Objective:   Physical Exam General- Alert, Oriented, Affect-appropriate, Distress- none acute; obese Skin- + faint macular erythematous rash around neck and peri-oral area Lymphadenopathy- none Head- atraumatic            Eyes- Gross vision intact, PERRLA, conjunctivae clear secretions            Ears- Hearing, canals-normal            Nose- minimal turbinate edema, no-Septal dev, mucus, polyps, erosion, perforation             Throat- Mallampati III , mucosa clear , drainage- none, tonsils- atrophic;  +mild chronic hoarseness-unchanged. Neck- flexible , trachea midline, no stridor , thyroid nl, carotid no bruit Chest - symmetrical excursion , unlabored           Heart/CV- RRR , no murmur , no gallop  , no rub, nl s1 s2                           - JVD- none , edema- none, stasis changes- none, varices- none           Lung- clear to P&A, wheeze- none, cough- none , dullness-none, rub- none           Chest wall-  Abd- Br/ Gen/ Rectal- Not done, not indicated Extrem- cyanosis- none, clubbing, none, atrophy- none, strength- nl. + varices at ankles Neuro- +resting tremor of chin

## 2012-02-13 ENCOUNTER — Ambulatory Visit (INDEPENDENT_AMBULATORY_CARE_PROVIDER_SITE_OTHER): Payer: Medicare Other

## 2012-02-13 DIAGNOSIS — J309 Allergic rhinitis, unspecified: Secondary | ICD-10-CM

## 2012-02-20 NOTE — Assessment & Plan Note (Signed)
Plan-refill Pulmicort and Ventolin with medication talk done. Control remains good.

## 2012-02-20 NOTE — Assessment & Plan Note (Signed)
We discussed continuation of her allergy vaccine. She feels it helps and is not inclined to change anything. There've been no problems with reactions. EpiPen was discussed again and is refilled.

## 2012-02-20 NOTE — Assessment & Plan Note (Signed)
Good compliance and control. Her daughter watches over her well. Weight loss would always help.

## 2012-03-04 ENCOUNTER — Encounter: Payer: Self-pay | Admitting: Internal Medicine

## 2012-04-03 ENCOUNTER — Other Ambulatory Visit: Payer: Self-pay | Admitting: Dermatology

## 2012-06-04 ENCOUNTER — Ambulatory Visit (INDEPENDENT_AMBULATORY_CARE_PROVIDER_SITE_OTHER): Payer: Medicare Other

## 2012-06-04 DIAGNOSIS — J309 Allergic rhinitis, unspecified: Secondary | ICD-10-CM

## 2012-06-18 ENCOUNTER — Other Ambulatory Visit: Payer: Self-pay

## 2012-06-18 DIAGNOSIS — Z1231 Encounter for screening mammogram for malignant neoplasm of breast: Secondary | ICD-10-CM

## 2012-07-11 ENCOUNTER — Ambulatory Visit
Admission: RE | Admit: 2012-07-11 | Discharge: 2012-07-11 | Disposition: A | Discharge status: Home / Self Care | Payer: Medicare Other | Source: Ambulatory Visit

## 2012-07-11 DIAGNOSIS — Z1231 Encounter for screening mammogram for malignant neoplasm of breast: Secondary | ICD-10-CM

## 2012-08-08 ENCOUNTER — Ambulatory Visit (INDEPENDENT_AMBULATORY_CARE_PROVIDER_SITE_OTHER): Payer: Medicare Other | Admitting: Internal Medicine

## 2012-08-08 ENCOUNTER — Encounter: Payer: Self-pay | Admitting: Internal Medicine

## 2012-08-08 VITALS — BP 160/82 | HR 81 | Ht 64.0 in | Wt 241.4 lb

## 2012-08-08 DIAGNOSIS — J309 Allergic rhinitis, unspecified: Secondary | ICD-10-CM

## 2012-08-08 DIAGNOSIS — G4733 Obstructive sleep apnea (adult) (pediatric): Secondary | ICD-10-CM

## 2012-08-08 DIAGNOSIS — J45998 Other asthma: Secondary | ICD-10-CM

## 2012-08-08 DIAGNOSIS — J3089 Other allergic rhinitis: Secondary | ICD-10-CM

## 2012-08-08 DIAGNOSIS — J45909 Unspecified asthma, uncomplicated: Secondary | ICD-10-CM

## 2012-08-08 NOTE — Patient Instructions (Addendum)
We can continue Allergy vaccine 1:10 GO  We can continue CPAP 8/ [facility]  Please call as needed

## 2012-08-08 NOTE — Progress Notes (Signed)
Patient ID: [name] [name], female    DOB: [birth year], 79 y.o.   MRN: [id number]  HPI 06/27/10-80 yoF followed for asthma and allergic rhinitis, complicated by hx of DVT, hx lipomas and GERD. Last here 12/27/09. She was seen by NP here for acute bronchitis with good response, then had Norovirus GI. She had f/u MRI for known lipoma in neck for which she is supposed to have traction/ Dr Joya Salm.  Seasonal pollens causing some frontal headache, sneeze, itch and rhinorhea. Drip makes her cough w/o wheeze. Ventolin will stop the cough. She continues allergy vaccine. Continues Zyrtec and plans to try Claritin. Some epistaxis. Review of Systems See HPI  12/27/10- 80 yoF followed for asthma and allergic rhinitis, complicated by hx of DVT, hx lipomas, DM, DVT and GERD Dx'd w/ cervical spondylosis. Sinus pressure headache feels better since changed from a pill to a patch- Clonidine 0.1mg . Can't afford generic singulair. Continues allergy vaccine without problems. Says this was a rough late summer and early fall because of pollens" dry hot weather", but she feels much better now. She switched back to see her today is her preferred antihistamine. Her daughter is concerned about obstructive sleep apnea potentially contributing to difficulty managing blood pressure and diabetes. She would like a sleep study done. She's not sure if she snores but does admit daytime sleepiness.  02/07/11- 80 yoF followed for asthma and allergic rhinitis, complicated by hx of DVT, hx lipomas, DM, DVT and GERD, OSA NPSG 01/15/11- confirmed mild OSA 7.6/hr. We discussed in the context of her weight and blood sugar.  She has been sleeping only on her sides. Her daughter manages a DME company providing CPAP and is the one she wants to manage her with this treatment trial. Meanwhile she continues allergy vaccine without problem and feels it helps.  04/10/11- 80 yoF followed for asthma and allergic rhinitis, complicated by hx of DVT, hx  lipomas, DM, DVT and GERD, OSA Doing very well with CPAP Auto- daughter [name] [name] works for [employer] and has done a good job of helping get started. Nasal pillows mask. Wears all night, every night. Wakes only once 2AM bathroom, then right back to sleep. More alert in daytime.  Download confirms good compliance- suggests 9 cwp. Asthma control very good. No wheeze or cough, little need for rescue meds as she continues budesonide. Nasal congestion improved w/ humidifier adjustment on CPAP- no pollen rhinitis, itch or sneeze. Continues allergy vaccine.   08/08/11- 32 yoF followed for asthma and allergic rhinitis, complicated by hx of DVT, hx lipomas, DM, and GERD, OSA Denies any SOB, wheezing, cough, or congestion;still on vaccine, Uses CPAP every night for approx 9 hours  Continues to do well with her allergy vaccine at 1:10. But did notice increased sneeze and postnasal drainage this spring so she stayed indoors.  Had a good winter. She remains on chronic Coumadin. CPAP download indicates excellent compliance and control of 8 CWP. Higher pressure was too much. She has been sleeping well.  02/08/12- 83 yoF followed for asthma and allergic rhinitis, complicated by hx of DVT, hx lipomas, DM, and GERD, OSA Follows for: pt reports breathing is doing well; still on allergy vaccine 1:10 GO. She is satisfied that this helps her still  Pt reports wearing CPAP 8/ [facility] (daughter's company) approx 9 hrs per night--states mask is working well. She was recently put on insulin which she blames for rash around the neck and face. I asked her to speak with  Dr. Brigitte Pulse about that.  08/08/12-82 yoF followed for asthma and allergic rhinitis, complicated by hx of DVT, hx lipomas, DM, and GERD, OSA FOLLOWS FOR: still on allergy vaccine 1:10 GO; avoids strong odors. Wears CPAP 8/ [facility] every night for about 6-8 hours-pressure working well for patient. CPAP autotitration suggest good pressure control between 8 and 9.  She is now on 8. Compliance is good but she has been under a lot of stress about her daughter with colon cancer after Crohn's disease.  ROS-See HPI. Constitutional:   No-   weight loss, night sweats, fevers, chills, fatigue, lassitude. HEENT:   No-  headaches, difficulty swallowing, tooth/dental problems, sore throat,       Little sneezing, itching, ear ache, nasal congestion, post nasal drip,  CV:  No-   chest pain, orthopnea, PND, swelling in lower extremities, anasarca, dizziness, palpitations Resp: No-   shortness of breath with exertion or at rest.              No-   productive cough,  No non-productive cough,  No- coughing up of blood.              No-   change in color of mucus.  No- wheezing.   Skin: No-   rash or lesions. GI:  No-   heartburn, indigestion, abdominal pain, nausea, vomiting,  GU:  MS:  No-   joint pain or swelling.   Neuro-     nothing unusual Psych:  No- change in mood or affect. No depression or anxiety.  No memory loss.     Objective:   Physical Exam General- Alert, Oriented, Affect-appropriate, Distress- none acute; obese Skin- + faint macular erythematous rash around neck and peri-oral area Lymphadenopathy- none Head- atraumatic            Eyes- Gross vision intact, PERRLA, conjunctivae clear secretions            Ears- Hearing, canals-normal            Nose- minimal turbinate edema, no-Septal dev, mucus, polyps, erosion, perforation             Throat- Mallampati III , mucosa clear , drainage- none, tonsils- atrophic;  +mild chronic hoarseness-unchanged. Neck- flexible , trachea midline, no stridor , thyroid nl, carotid no bruit Chest - symmetrical excursion , unlabored           Heart/CV- RRR , no murmur , no gallop  , no rub, nl s1 s2                           - JVD- none , edema- none, stasis changes- none, varices- none           Lung- clear to P&A, wheeze- none, cough- none , dullness-none, rub- none           Chest wall-  Abd- Br/ Gen/ Rectal-  Not done, not indicated Extrem- cyanosis- none, clubbing, none, atrophy- none, strength- nl. + varices at ankles Neuro- +resting tremor of chin

## 2012-08-18 NOTE — Assessment & Plan Note (Signed)
We can continue CPAP and he for now, with option to increase to 9 later. Comfort measures reviewed.

## 2012-08-18 NOTE — Assessment & Plan Note (Signed)
Symptomatic treatment and her allergy vaccine can continue

## 2012-08-18 NOTE — Assessment & Plan Note (Signed)
She expresses satisfaction that allergy vaccine or her. No acute problems. She mentions avoiding strong odors and respiratory irritants.

## 2012-10-08 ENCOUNTER — Ambulatory Visit (INDEPENDENT_AMBULATORY_CARE_PROVIDER_SITE_OTHER): Payer: Medicare Other

## 2012-10-08 DIAGNOSIS — J309 Allergic rhinitis, unspecified: Secondary | ICD-10-CM

## 2013-01-06 ENCOUNTER — Other Ambulatory Visit: Payer: Self-pay | Admitting: *Deleted

## 2013-01-06 ENCOUNTER — Telehealth: Payer: Self-pay | Admitting: Internal Medicine

## 2013-01-06 MED ORDER — ALBUTEROL SULFATE HFA 108 (90 BASE) MCG/ACT IN AERS: 2.0000 | INHALATION_SPRAY | RESPIRATORY_TRACT | Status: DC | PRN

## 2013-01-06 MED ORDER — BUDESONIDE 180 MCG/ACT IN AEPB: 1.0000 | INHALATION_SPRAY | Freq: Two times a day (BID) | RESPIRATORY_TRACT | Status: DC

## 2013-01-06 NOTE — Telephone Encounter (Signed)
Rx's have been sent in. Pt is aware. 

## 2013-01-13 ENCOUNTER — Ambulatory Visit (INDEPENDENT_AMBULATORY_CARE_PROVIDER_SITE_OTHER): Payer: Medicare Other

## 2013-01-13 DIAGNOSIS — J309 Allergic rhinitis, unspecified: Secondary | ICD-10-CM

## 2013-02-03 ENCOUNTER — Encounter: Payer: Self-pay | Admitting: Internal Medicine

## 2013-02-03 ENCOUNTER — Ambulatory Visit (INDEPENDENT_AMBULATORY_CARE_PROVIDER_SITE_OTHER): Payer: Medicare Other | Admitting: Internal Medicine

## 2013-02-03 VITALS — BP 128/60 | HR 57 | Ht 64.0 in | Wt 239.0 lb

## 2013-02-03 DIAGNOSIS — G4733 Obstructive sleep apnea (adult) (pediatric): Secondary | ICD-10-CM

## 2013-02-03 DIAGNOSIS — J45909 Unspecified asthma, uncomplicated: Secondary | ICD-10-CM

## 2013-02-03 DIAGNOSIS — J302 Other seasonal allergic rhinitis: Secondary | ICD-10-CM

## 2013-02-03 DIAGNOSIS — J45998 Other asthma: Secondary | ICD-10-CM

## 2013-02-03 DIAGNOSIS — J309 Allergic rhinitis, unspecified: Secondary | ICD-10-CM

## 2013-02-03 MED ORDER — EPINEPHRINE 0.3 MG/0.3ML IJ SOAJ: INTRAMUSCULAR | Status: DC

## 2013-02-03 NOTE — Patient Instructions (Addendum)
We can continue allergy vaccine 1:10 GO  We can continue CPAP 8/ [facility]

## 2013-02-03 NOTE — Progress Notes (Signed)
Patient ID: [name] [name], female    DOB: 1929-12-27, 79 y.o.   MRN: [id number]  HPI 06/27/10-80 yoF followed for asthma and allergic rhinitis, complicated by hx of DVT, hx lipomas and GERD. Last here 12/27/09. She was seen by NP here for acute bronchitis with good response, then had Norovirus GI. She had f/u MRI for known lipoma in neck for which she is supposed to have traction/ Dr Joya Salm.  Seasonal pollens causing some frontal headache, sneeze, itch and rhinorhea. Drip makes her cough w/o wheeze. Ventolin will stop the cough. She continues allergy vaccine. Continues Zyrtec and plans to try Claritin. Some epistaxis. Review of Systems See HPI  12/27/10- 80 yoF followed for asthma and allergic rhinitis, complicated by hx of DVT, hx lipomas, DM, DVT and GERD Dx'd w/ cervical spondylosis. Sinus pressure headache feels better since changed from a pill to a patch- Clonidine 0.1mg . Can't afford generic singulair. Continues allergy vaccine without problems. Says this was a rough late summer and early fall because of pollens" dry hot weather", but she feels much better now. She switched back to see her today is her preferred antihistamine. Her daughter is concerned about obstructive sleep apnea potentially contributing to difficulty managing blood pressure and diabetes. She would like a sleep study done. She's not sure if she snores but does admit daytime sleepiness.  02/07/11- 80 yoF followed for asthma and allergic rhinitis, complicated by hx of DVT, hx lipomas, DM, DVT and GERD, OSA NPSG 01/15/11- confirmed mild OSA 7.6/hr. We discussed in the context of her weight and blood sugar.  She has been sleeping only on her sides. Her daughter manages a DME company providing CPAP and is the one she wants to manage her with this treatment trial. Meanwhile she continues allergy vaccine without problem and feels it helps.  04/10/11- 80 yoF followed for asthma and allergic rhinitis, complicated by hx of DVT, hx  lipomas, DM, DVT and GERD, OSA Doing very well with CPAP Auto- daughter [name] works for [employer] and has done a good job of helping get started. Nasal pillows mask. Wears all night, every night. Wakes only once 2AM bathroom, then right back to sleep. More alert in daytime.  Download confirms good compliance- suggests 9 cwp. Asthma control very good. No wheeze or cough, little need for rescue meds as she continues budesonide. Nasal congestion improved w/ humidifier adjustment on CPAP- no pollen rhinitis, itch or sneeze. Continues allergy vaccine.   08/08/11- 12 yoF followed for asthma and allergic rhinitis, complicated by hx of DVT, hx lipomas, DM, and GERD, OSA Denies any SOB, wheezing, cough, or congestion;still on vaccine, Uses CPAP every night for approx 9 hours  Continues to do well with her allergy vaccine at 1:10. But did notice increased sneeze and postnasal drainage this spring so she stayed indoors.  Had a good winter. She remains on chronic Coumadin. CPAP download indicates excellent compliance and control of 8 CWP. Higher pressure was too much. She has been sleeping well.  02/08/12- 64 yoF followed for asthma and allergic rhinitis, complicated by hx of DVT, hx lipomas, DM, and GERD, OSA Follows for: pt reports breathing is doing well; still on allergy vaccine 1:10 GO. She is satisfied that this helps her still  Pt reports wearing CPAP 8/ [facility] (daughter's company) approx 9 hrs per night--states mask is working well. She was recently put on insulin which she blames for rash around the neck and face. I asked her to speak with  Dr. Brigitte Pulse about that.  08/08/12-82 yoF followed for asthma and allergic rhinitis, complicated by hx of DVT, hx lipomas, DM, and GERD, OSA FOLLOWS FOR: still on allergy vaccine 1:10 GO; avoids strong odors. Wears CPAP 8/ [facility] every night for about 6-8 hours-pressure working well for patient. CPAP autotitration suggest good pressure control between 8 and 9.  She is now on 8. Compliance is good but she has been under a lot of stress about her daughter with colon cancer after Crohn's disease.  02/03/13- 68 yoF followed for asthma and allergic rhinitis, OSA/ CPAP complicated by hx of DVT, hx lipomas, DM, and GERD,  FOLLOWS FOR: still on Allergy vaccine 1:10 and doing well; Wears CPAP 8/ [facility] every night; download attached to paperwork. Daughter is a sleep tech.  Download confirms good compliance and control. She reports sleeping well with a nasal pillows mask.  ROS-See HPI. Constitutional:   No-   weight loss, night sweats, fevers, chills, fatigue, lassitude. HEENT:   No-  headaches, difficulty swallowing, tooth/dental problems, sore throat,       Little sneezing, itching, ear ache, nasal congestion, post nasal drip,  CV:  No-   chest pain, orthopnea, PND, swelling in lower extremities, anasarca, dizziness, palpitations Resp: No-   shortness of breath with exertion or at rest.              No-   productive cough,  No non-productive cough,  No- coughing up of blood.              No-   change in color of mucus.  No- wheezing.   Skin: No-   rash or lesions. GI:  No-   heartburn, indigestion, abdominal pain, nausea, vomiting,  GU:  MS:  No-   joint pain or swelling.   Neuro-     nothing unusual Psych:  No- change in mood or affect. No depression or anxiety.  No memory loss.     Objective:   Physical Exam General- Alert, Oriented, Affect-appropriate, Distress- none acute; obese Skin- + faint macular erythematous rash around neck and peri-oral area Lymphadenopathy- none Head- atraumatic            Eyes- Gross vision intact, PERRLA, conjunctivae clear secretions            Ears- Hearing, canals-normal            Nose- minimal turbinate edema, no-Septal dev, mucus, polyps, erosion, perforation             Throat- Mallampati III , mucosa clear , drainage- none, tonsils- atrophic;  +mild chronic hoarseness-unchanged. Neck- flexible , trachea  midline, no stridor , thyroid nl, carotid no bruit Chest - symmetrical excursion , unlabored           Heart/CV- RRR , no murmur , no gallop  , no rub, nl s1 s2                           - JVD- none , edema- none, stasis changes- none, varices- none           Lung- clear to P&A, wheeze- none, cough- none , dullness-none, rub- none           Chest wall-  Abd- Br/ Gen/ Rectal- Not done, not indicated Extrem- cyanosis- none, clubbing, none, atrophy- none, strength- nl. + varices at ankles Neuro- +resting tremor

## 2013-02-22 ENCOUNTER — Encounter: Payer: Self-pay | Admitting: Internal Medicine

## 2013-02-22 NOTE — Assessment & Plan Note (Signed)
We can continue allergy vaccine as discussed.

## 2013-02-22 NOTE — Assessment & Plan Note (Signed)
She is quite satisfied to continue allergy vaccine. We discussed risk benefit and goals.

## 2013-02-22 NOTE — Assessment & Plan Note (Signed)
NPSG 01/15/11- AHI 7.6/hr CPAP 8/ [facility]  Good compliance and control. Weight loss would help

## 2013-04-06 ENCOUNTER — Other Ambulatory Visit: Payer: Self-pay | Admitting: Dermatology

## 2013-05-28 ENCOUNTER — Ambulatory Visit (INDEPENDENT_AMBULATORY_CARE_PROVIDER_SITE_OTHER): Payer: Medicare Other

## 2013-05-28 DIAGNOSIS — J309 Allergic rhinitis, unspecified: Secondary | ICD-10-CM

## 2013-06-15 ENCOUNTER — Other Ambulatory Visit: Payer: Self-pay

## 2013-06-15 DIAGNOSIS — Z1231 Encounter for screening mammogram for malignant neoplasm of breast: Secondary | ICD-10-CM

## 2013-07-13 ENCOUNTER — Ambulatory Visit
Admission: RE | Admit: 2013-07-13 | Discharge: 2013-07-13 | Disposition: A | Discharge status: Home / Self Care | Payer: Medicare Other | Source: Ambulatory Visit

## 2013-07-13 DIAGNOSIS — Z1231 Encounter for screening mammogram for malignant neoplasm of breast: Secondary | ICD-10-CM

## 2013-08-04 ENCOUNTER — Encounter: Payer: Self-pay | Admitting: Internal Medicine

## 2013-08-04 ENCOUNTER — Ambulatory Visit (INDEPENDENT_AMBULATORY_CARE_PROVIDER_SITE_OTHER): Payer: Medicare Other | Admitting: Internal Medicine

## 2013-08-04 VITALS — BP 128/72 | HR 62 | Ht 64.0 in | Wt 241.0 lb

## 2013-08-04 DIAGNOSIS — J309 Allergic rhinitis, unspecified: Secondary | ICD-10-CM

## 2013-08-04 DIAGNOSIS — J0110 Acute frontal sinusitis, unspecified: Secondary | ICD-10-CM

## 2013-08-04 DIAGNOSIS — J302 Other seasonal allergic rhinitis: Secondary | ICD-10-CM

## 2013-08-04 DIAGNOSIS — J3089 Other allergic rhinitis: Secondary | ICD-10-CM

## 2013-08-04 DIAGNOSIS — E669 Obesity, unspecified: Secondary | ICD-10-CM

## 2013-08-04 DIAGNOSIS — G4733 Obstructive sleep apnea (adult) (pediatric): Secondary | ICD-10-CM

## 2013-08-04 MED ORDER — CEFDINIR 300 MG PO CAPS: 300.0000 mg | ORAL_CAPSULE | Freq: Two times a day (BID) | ORAL | Status: DC

## 2013-08-04 NOTE — Patient Instructions (Signed)
Order- needs new DME company to replace [facility]     Continue CPAP auto 5-15, mask of choice,heated humidity   Dx OSA  We can continue Allergy vaccine 1:10 GO  Script for Cefdinir antibiotic sent

## 2013-08-04 NOTE — Progress Notes (Signed)
Patient ID: [name] [name], female    DOB: [birth year], 80 y.o.   MRN: [id number]  HPI 06/27/10-80 yoF followed for asthma and allergic rhinitis, complicated by hx of DVT, hx lipomas and GERD. Last here 12/27/09. She was seen by NP here for acute bronchitis with good response, then had Norovirus GI. She had f/u MRI for known lipoma in neck for which she is supposed to have traction/ Dr Joya Salm.  Seasonal pollens causing some frontal headache, sneeze, itch and rhinorhea. Drip makes her cough w/o wheeze. Ventolin will stop the cough. She continues allergy vaccine. Continues Zyrtec and plans to try Claritin. Some epistaxis. Review of Systems See HPI  12/27/10- 80 yoF followed for asthma and allergic rhinitis, complicated by hx of DVT, hx lipomas, DM, DVT and GERD Dx'd w/ cervical spondylosis. Sinus pressure headache feels better since changed from a pill to a patch- Clonidine 0.1mg . Can't afford generic singulair. Continues allergy vaccine without problems. Says this was a rough late summer and early fall because of pollens" dry hot weather", but she feels much better now. She switched back to see her today is her preferred antihistamine. Her daughter is concerned about obstructive sleep apnea potentially contributing to difficulty managing blood pressure and diabetes. She would like a sleep study done. She's not sure if she snores but does admit daytime sleepiness.  02/07/11- 80 yoF followed for asthma and allergic rhinitis, complicated by hx of DVT, hx lipomas, DM, DVT and GERD, OSA NPSG 01/15/11- confirmed mild OSA 7.6/hr. We discussed in the context of her weight and blood sugar.  She has been sleeping only on her sides. Her daughter manages a DME company providing CPAP and is the one she wants to manage her with this treatment trial. Meanwhile she continues allergy vaccine without problem and feels it helps.  04/10/11- 80 yoF followed for asthma and allergic rhinitis, complicated by hx of DVT, hx  lipomas, DM, DVT and GERD, OSA Doing very well with CPAP Auto- daughter [name] works for [employer] and has done a good job of helping get started. Nasal pillows mask. Wears all night, every night. Wakes only once 2AM bathroom, then right back to sleep. More alert in daytime.  Download confirms good compliance- suggests 9 cwp. Asthma control very good. No wheeze or cough, little need for rescue meds as she continues budesonide. Nasal congestion improved w/ humidifier adjustment on CPAP- no pollen rhinitis, itch or sneeze. Continues allergy vaccine.   08/08/11- 82 yoF followed for asthma and allergic rhinitis, complicated by hx of DVT, hx lipomas, DM, and GERD, OSA Denies any SOB, wheezing, cough, or congestion;still on vaccine, Uses CPAP every night for approx 9 hours  Continues to do well with her allergy vaccine at 1:10. But did notice increased sneeze and postnasal drainage this spring so she stayed indoors.  Had a good winter. She remains on chronic Coumadin. CPAP download indicates excellent compliance and control of 8 CWP. Higher pressure was too much. She has been sleeping well.  02/08/12- 29 yoF followed for asthma and allergic rhinitis, complicated by hx of DVT, hx lipomas, DM, and GERD, OSA Follows for: pt reports breathing is doing well; still on allergy vaccine 1:10 GO. She is satisfied that this helps her still  Pt reports wearing CPAP 8/ [facility] (daughter's company) approx 9 hrs per night--states mask is working well. She was recently put on insulin which she blames for rash around the neck and face. I asked her to speak with  Dr. Brigitte Pulse about that.  08/08/12-82 yoF followed for asthma and allergic rhinitis, complicated by hx of DVT, hx lipomas, DM, and GERD, OSA FOLLOWS FOR: still on allergy vaccine 1:10 GO; avoids strong odors. Wears CPAP 8/ [facility] every night for about 6-8 hours-pressure working well for patient. CPAP autotitration suggest good pressure control between 8 and 9.  She is now on 8. Compliance is good but she has been under a lot of stress about her daughter with colon cancer after Crohn's disease.  02/03/13- 71 yoF followed for asthma and allergic rhinitis, OSA/ CPAP complicated by hx of DVT, hx lipomas, DM, and GERD,  FOLLOWS FOR: still on Allergy vaccine 1:10 and doing well; Wears CPAP 8/ [facility] every night; download attached to paperwork. Daughter is a sleep tech.  Download confirms good compliance and control. She reports sleeping well with a nasal pillows mask.  08/04/13- 57 yoF followed for asthma and allergic rhinitis, OSA/ CPAP complicated by hx of DVT, hx lipomas, DM, and GERD,  FOLLOWS FOR: having hoarseness, sneezing, cough, and "snotty". Taking Claritin daily. Still on   Allergy vaccine 1:10 GO and doing well. CPAP 5-15 Auto/ [facility]. Download indicates adequate control and compliance. She wasn't able to wear her CPAP for 3 weeks as she recovered from skin cancer surgery on her nose, but is back at now. She is changing home care companies because her daughter no longer works at [employer]. Manage spring pollen with Claritin. Has had a sinus infection with frontal headache and yellow nasal discharge.  ROS-See HPI. Constitutional:   No-   weight loss, night sweats, fevers, chills, fatigue, lassitude. HEENT:   +  headaches, no-difficulty swallowing, tooth/dental problems, sore throat,       Little sneezing, itching, ear ache, +nasal congestion, post nasal drip,  CV:  No-   chest pain, orthopnea, PND, swelling in lower extremities, anasarca, dizziness, palpitations Resp: No-   shortness of breath with exertion or at rest.              No-   productive cough,  No non-productive cough,  No- coughing up of blood.              No-   change in color of mucus.  No- wheezing.   Skin: No-   rash or lesions. GI:  No-   heartburn, indigestion, abdominal pain, nausea, vomiting,  GU:  MS:  No-   joint pain or swelling.   Neuro-     nothing  unusual Psych:  No- change in mood or affect. No depression or anxiety.  No memory loss.     Objective:   Physical Exam General- Alert, Oriented, Affect-appropriate, Distress- none acute; obese Skin- + faint macular erythematous rash around neck and peri-oral area Lymphadenopathy- none Head- atraumatic            Eyes- Gross vision intact, PERRLA, conjunctivae clear secretions            Ears- Hearing, canals-normal            Nose- minimal turbinate edema, no-Septal dev, mucus, polyps, erosion, perforation             Throat- Mallampati III , mucosa clear , drainage- none, tonsils- atrophic;  +mild chronic hoarseness-unchanged. Neck- flexible , trachea midline, no stridor , thyroid nl, carotid no bruit Chest - symmetrical excursion , unlabored           Heart/CV- RRR , no murmur , no gallop  ,  no rub, nl s1 s2                           - JVD- none , edema- none, stasis changes- none, varices- none           Lung- clear to P&A, wheeze- none, cough- none , dullness-none, rub- none           Chest wall-  Abd- Br/ Gen/ Rectal- Not done, not indicated Extrem- cyanosis- none, clubbing, none, atrophy- none, strength- nl. + varices at ankles Neuro- +resting tremor

## 2013-09-20 DIAGNOSIS — J0110 Acute frontal sinusitis, unspecified: Secondary | ICD-10-CM | POA: Insufficient documentation

## 2013-09-20 NOTE — Assessment & Plan Note (Signed)
Continues allergy vaccine without concern.

## 2013-09-20 NOTE — Assessment & Plan Note (Signed)
Plan-Cedinir

## 2013-09-20 NOTE — Assessment & Plan Note (Signed)
Again encouraged her to try to lose some weight-would benefit her sleep apnea and diabetes

## 2013-09-20 NOTE — Assessment & Plan Note (Signed)
Has been comfortably controlled on autotitration 5-15. Now needs new DME company to replace [facility]

## 2013-09-29 ENCOUNTER — Ambulatory Visit (INDEPENDENT_AMBULATORY_CARE_PROVIDER_SITE_OTHER): Payer: Medicare Other

## 2013-09-29 DIAGNOSIS — J309 Allergic rhinitis, unspecified: Secondary | ICD-10-CM

## 2013-12-22 ENCOUNTER — Other Ambulatory Visit: Payer: Self-pay | Admitting: Dermatology

## 2014-01-14 ENCOUNTER — Encounter: Payer: Self-pay | Admitting: Internal Medicine

## 2014-01-29 ENCOUNTER — Ambulatory Visit (INDEPENDENT_AMBULATORY_CARE_PROVIDER_SITE_OTHER): Payer: Medicare Other

## 2014-01-29 DIAGNOSIS — J309 Allergic rhinitis, unspecified: Secondary | ICD-10-CM

## 2014-02-02 ENCOUNTER — Ambulatory Visit (INDEPENDENT_AMBULATORY_CARE_PROVIDER_SITE_OTHER): Payer: Medicare Other | Admitting: Internal Medicine

## 2014-02-02 ENCOUNTER — Encounter: Payer: Self-pay | Admitting: Internal Medicine

## 2014-02-02 VITALS — BP 140/82 | HR 64 | Ht 64.0 in | Wt 240.4 lb

## 2014-02-02 DIAGNOSIS — J45998 Other asthma: Secondary | ICD-10-CM

## 2014-02-02 MED ORDER — EPINEPHRINE 0.3 MG/0.3ML IJ SOAJ: INTRAMUSCULAR | Status: DC

## 2014-02-02 MED ORDER — BUDESONIDE 0.25 MG/2ML IN SUSP: 0.2500 mg | Freq: Two times a day (BID) | RESPIRATORY_TRACT | Status: DC

## 2014-02-02 MED ORDER — ALBUTEROL SULFATE HFA 108 (90 BASE) MCG/ACT IN AERS: INHALATION_SPRAY | RESPIRATORY_TRACT | Status: DC

## 2014-02-02 MED ORDER — ALBUTEROL SULFATE (2.5 MG/3ML) 0.083% IN NEBU: 2.5000 mg | INHALATION_SOLUTION | Freq: Four times a day (QID) | RESPIRATORY_TRACT | Status: DC | PRN

## 2014-02-02 NOTE — Patient Instructions (Signed)
Script to drug store for [employer] printed for budesonide and albuterol neb solution - we will send to [facility].  Orders to [facility] for neb solutions per scripts   Dx Asthma, mild persistent  We note change from Ventolin to Proair- no script printed for now  We can continue allergy vaccine 1:10 GO  We can continue CPAP Auto 5-15/ [facility]

## 2014-02-02 NOTE — Progress Notes (Signed)
Patient ID: [name] [name], female    DOB: [birth year], 80 y.o.   MRN: [id number]  HPI 06/27/10-80 yoF followed for asthma and allergic rhinitis, complicated by hx of DVT, hx lipomas and GERD. Last here 12/27/09. She was seen by NP here for acute bronchitis with good response, then had Norovirus GI. She had f/u MRI for known lipoma in neck for which she is supposed to have traction/ Dr Joya Salm.  Seasonal pollens causing some frontal headache, sneeze, itch and rhinorhea. Drip makes her cough w/o wheeze. Ventolin will stop the cough. She continues allergy vaccine. Continues Zyrtec and plans to try Claritin. Some epistaxis. Review of Systems See HPI  12/27/10- 80 yoF followed for asthma and allergic rhinitis, complicated by hx of DVT, hx lipomas, DM, DVT and GERD Dx'd w/ cervical spondylosis. Sinus pressure headache feels better since changed from a pill to a patch- Clonidine 0.1mg . Can't afford generic singulair. Continues allergy vaccine without problems. Says this was a rough late summer and early fall because of pollens" dry hot weather", but she feels much better now. She switched back to see her today is her preferred antihistamine. Her daughter is concerned about obstructive sleep apnea potentially contributing to difficulty managing blood pressure and diabetes. She would like a sleep study done. She's not sure if she snores but does admit daytime sleepiness.  02/07/11- 80 yoF followed for asthma and allergic rhinitis, complicated by hx of DVT, hx lipomas, DM, DVT and GERD, OSA NPSG 01/15/11- confirmed mild OSA 7.6/hr. We discussed in the context of her weight and blood sugar.  She has been sleeping only on her sides. Her daughter manages a DME company providing CPAP and is the one she wants to manage her with this treatment trial. Meanwhile she continues allergy vaccine without problem and feels it helps.  04/10/11- 80 yoF followed for asthma and allergic rhinitis, complicated by hx of DVT, hx  lipomas, DM, DVT and GERD, OSA Doing very well with CPAP Auto- daughter [name] works for [facility] [employer] and has done a good job of helping get started. Nasal pillows mask. Wears all night, every night. Wakes only once 2AM bathroom, then right back to sleep. More alert in daytime.  Download confirms good compliance- suggests 9 cwp. Asthma control very good. No wheeze or cough, little need for rescue meds as she continues budesonide. Nasal congestion improved w/ humidifier adjustment on CPAP- no pollen rhinitis, itch or sneeze. Continues allergy vaccine.   08/08/11- 32 yoF followed for asthma and allergic rhinitis, complicated by hx of DVT, hx lipomas, DM, and GERD, OSA Denies any SOB, wheezing, cough, or congestion;still on vaccine, Uses CPAP every night for approx 9 hours  Continues to do well with her allergy vaccine at 1:10. But did notice increased sneeze and postnasal drainage this spring so she stayed indoors.  Had a good winter. She remains on chronic Coumadin. CPAP download indicates excellent compliance and control of 8 CWP. Higher pressure was too much. She has been sleeping well.  02/08/12- 83 yoF followed for asthma and allergic rhinitis, complicated by hx of DVT, hx lipomas, DM, and GERD, OSA Follows for: pt reports breathing is doing well; still on allergy vaccine 1:10 GO. She is satisfied that this helps her still  Pt reports wearing CPAP 8/ [facility] (daughter's company) approx 9 hrs per night--states mask is working well. She was recently put on insulin which she blames for rash around the neck and face. I asked her to speak with  Dr. Brigitte Pulse about that.  08/08/12-82 yoF followed for asthma and allergic rhinitis, complicated by hx of DVT, hx lipomas, DM, and GERD, OSA FOLLOWS FOR: still on allergy vaccine 1:10 GO; avoids strong odors. Wears CPAP 8/ [facility] every night for about 6-8 hours-pressure working well for patient. CPAP autotitration suggest good pressure control between 8 and 9.  She is now on 8. Compliance is good but she has been under a lot of stress about her daughter with colon cancer after Crohn's disease.  02/03/13- 13 yoF followed for asthma and allergic rhinitis, OSA/ CPAP complicated by hx of DVT, hx lipomas, DM, and GERD,  FOLLOWS FOR: still on Allergy vaccine 1:10 and doing well; Wears CPAP 8/ [facility] every night; download attached to paperwork. Daughter is a sleep tech.  Download confirms good compliance and control. She reports sleeping well with a nasal pillows mask.  08/04/13- 42 yoF followed for asthma and allergic rhinitis, OSA/ CPAP complicated by hx of DVT, hx lipomas, DM, and GERD,  FOLLOWS FOR: having hoarseness, sneezing, cough, and "snotty". Taking Claritin daily. Still on   Allergy vaccine 1:10 GO and doing well. CPAP 5-15 Auto/ [facility]. Download indicates adequate control and compliance. She wasn't able to wear her CPAP for 3 weeks as she recovered from skin cancer surgery on her nose, but is back at now. She is changing home care companies because her daughter no longer works at [employer]. Manage spring pollen with Claritin. Has had a sinus infection with frontal headache and yellow nasal discharge.  02/02/14-83 yoF followed for asthma and allergic rhinitis, OSA/ CPAP complicated by hx of DVT, hx lipomas, DM, and GERD FOLLOWS FOR: DME is [facility]-continues to wear CPAP 5-15 Auto/ [facility] every night-download attached to OV notes. Also has changes in insurance and medications coming for first of year. Still on Allergy Vaccine 1:10 GO and doing well-will need Epipen Rx refilled.   ROS-See HPI. Constitutional:   No-   weight loss, night sweats, fevers, chills, fatigue, lassitude. HEENT:   +  headaches, no-difficulty swallowing, tooth/dental problems, sore throat,       Little sneezing, itching, ear ache, +nasal congestion, post nasal drip,  CV:  No-   chest pain, orthopnea, PND, swelling in lower extremities, anasarca, dizziness, palpitations Resp:  No-   shortness of breath with exertion or at rest.              No-   productive cough,  No non-productive cough,  No- coughing up of blood.              No-   change in color of mucus.  No- wheezing.   Skin: No-   rash or lesions. GI:  No-   heartburn, indigestion, abdominal pain, nausea, vomiting,  GU:  MS:  No-   joint pain or swelling.   Neuro-     nothing unusual Psych:  No- change in mood or affect. No depression or anxiety.  No memory loss.     Objective:   Physical Exam General- Alert, Oriented, Affect-appropriate, Distress- none acute; obese Skin- + faint macular erythematous rash around neck and peri-oral area Lymphadenopathy- none Head- atraumatic            Eyes- Gross vision intact, PERRLA, conjunctivae clear secretions            Ears- Hearing, canals-normal            Nose- minimal turbinate edema, no-Septal dev, mucus, polyps, erosion, perforation  Throat- Mallampati III , mucosa clear , drainage- none, tonsils- atrophic;  +mild chronic hoarseness-unchanged. Neck- flexible , trachea midline, no stridor , thyroid nl, carotid no bruit Chest - symmetrical excursion , unlabored           Heart/CV- RRR , no murmur , no gallop  , no rub, nl s1 s2                           - JVD- none , edema- none, stasis changes- none, varices- none           Lung- clear to P&A, wheeze- none, cough- none , dullness-none, rub- none           Chest wall-  Abd- Br/ Gen/ Rectal- Not done, not indicated Extrem- cyanosis- none, clubbing, none, atrophy- none, strength- nl. + varices at ankles Neuro- +resting tremor

## 2014-05-04 ENCOUNTER — Ambulatory Visit (INDEPENDENT_AMBULATORY_CARE_PROVIDER_SITE_OTHER): Payer: Medicare Other

## 2014-05-04 DIAGNOSIS — J309 Allergic rhinitis, unspecified: Secondary | ICD-10-CM

## 2014-06-09 ENCOUNTER — Other Ambulatory Visit: Payer: Self-pay

## 2014-06-09 DIAGNOSIS — Z1231 Encounter for screening mammogram for malignant neoplasm of breast: Secondary | ICD-10-CM

## 2014-07-19 ENCOUNTER — Ambulatory Visit
Admission: RE | Admit: 2014-07-19 | Discharge: 2014-07-19 | Disposition: A | Discharge status: Home / Self Care | Payer: Medicare Other | Source: Ambulatory Visit

## 2014-07-19 DIAGNOSIS — Z1231 Encounter for screening mammogram for malignant neoplasm of breast: Secondary | ICD-10-CM

## 2014-07-20 ENCOUNTER — Other Ambulatory Visit: Payer: Self-pay | Admitting: Internal Medicine

## 2014-07-20 DIAGNOSIS — R928 Other abnormal and inconclusive findings on diagnostic imaging of breast: Secondary | ICD-10-CM

## 2014-07-23 ENCOUNTER — Ambulatory Visit
Admission: RE | Admit: 2014-07-23 | Discharge: 2014-07-23 | Disposition: A | Discharge status: Home / Self Care | Payer: Medicare Other | Source: Ambulatory Visit | Attending: Internal Medicine | Admitting: Internal Medicine

## 2014-07-23 DIAGNOSIS — R928 Other abnormal and inconclusive findings on diagnostic imaging of breast: Secondary | ICD-10-CM

## 2014-08-03 ENCOUNTER — Encounter: Payer: Self-pay | Admitting: Internal Medicine

## 2014-08-03 ENCOUNTER — Ambulatory Visit (INDEPENDENT_AMBULATORY_CARE_PROVIDER_SITE_OTHER): Payer: Medicare Other | Admitting: Internal Medicine

## 2014-08-03 VITALS — BP 128/66 | HR 64 | Ht 64.0 in | Wt 237.0 lb

## 2014-08-03 DIAGNOSIS — G4733 Obstructive sleep apnea (adult) (pediatric): Secondary | ICD-10-CM | POA: Diagnosis not present

## 2014-08-03 DIAGNOSIS — J45998 Other asthma: Secondary | ICD-10-CM

## 2014-08-03 DIAGNOSIS — J449 Chronic obstructive pulmonary disease, unspecified: Secondary | ICD-10-CM

## 2014-08-03 MED ORDER — ALBUTEROL SULFATE (2.5 MG/3ML) 0.083% IN NEBU: 2.5000 mg | INHALATION_SOLUTION | Freq: Four times a day (QID) | RESPIRATORY_TRACT | Status: DC | PRN

## 2014-08-03 MED ORDER — BUDESONIDE 0.25 MG/2ML IN SUSP: 0.2500 mg | Freq: Two times a day (BID) | RESPIRATORY_TRACT | Status: DC

## 2014-08-03 MED ORDER — ALBUTEROL SULFATE HFA 108 (90 BASE) MCG/ACT IN AERS: INHALATION_SPRAY | RESPIRATORY_TRACT | Status: DC

## 2014-08-03 NOTE — Progress Notes (Signed)
Patient ID: [name] [name], female    DOB: January 04, 1930, 81 y.o.   MRN: [id number]  HPI 06/27/10-80 yoF followed for asthma and allergic rhinitis, complicated by hx of DVT, hx lipomas and GERD. Last here 12/27/09. She was seen by NP here for acute bronchitis with good response, then had Norovirus GI. She had f/u MRI for known lipoma in neck for which she is supposed to have traction/ Dr Joya Salm.  Seasonal pollens causing some frontal headache, sneeze, itch and rhinorhea. Drip makes her cough w/o wheeze. Ventolin will stop the cough. She continues allergy vaccine. Continues Zyrtec and plans to try Claritin. Some epistaxis. Review of Systems See HPI  12/27/10- 80 yoF followed for asthma and allergic rhinitis, complicated by hx of DVT, hx lipomas, DM, DVT and GERD Dx'd w/ cervical spondylosis. Sinus pressure headache feels better since changed from a pill to a patch- Clonidine 0.1mg . Can't afford generic singulair. Continues allergy vaccine without problems. Says this was a rough late summer and early fall because of pollens" dry hot weather", but she feels much better now. She switched back to see her today is her preferred antihistamine. Her daughter is concerned about obstructive sleep apnea potentially contributing to difficulty managing blood pressure and diabetes. She would like a sleep study done. She's not sure if she snores but does admit daytime sleepiness.  02/07/11- 80 yoF followed for asthma and allergic rhinitis, complicated by hx of DVT, hx lipomas, DM, DVT and GERD, OSA NPSG 01/15/11- confirmed mild OSA 7.6/hr. We discussed in the context of her weight and blood sugar.  She has been sleeping only on her sides. Her daughter manages a DME company providing CPAP and is the one she wants to manage her with this treatment trial. Meanwhile she continues allergy vaccine without problem and feels it helps.  04/10/11- 80 yoF followed for asthma and allergic rhinitis, complicated by hx of DVT, hx  lipomas, DM, DVT and GERD, OSA Doing very well with CPAP Auto- daughter [name] works for [employer] and has done a good job of helping get started. Nasal pillows mask. Wears all night, every night. Wakes only once 2AM bathroom, then right back to sleep. More alert in daytime.  Download confirms good compliance- suggests 9 cwp. Asthma control very good. No wheeze or cough, little need for rescue meds as she continues budesonide. Nasal congestion improved w/ humidifier adjustment on CPAP- no pollen rhinitis, itch or sneeze. Continues allergy vaccine.   08/08/11- 74 yoF followed for asthma and allergic rhinitis, complicated by hx of DVT, hx lipomas, DM, and GERD, OSA Denies any SOB, wheezing, cough, or congestion;still on vaccine, Uses CPAP every night for approx 9 hours  Continues to do well with her allergy vaccine at 1:10. But did notice increased sneeze and postnasal drainage this spring so she stayed indoors.  Had a good winter. She remains on chronic Coumadin. CPAP download indicates excellent compliance and control of 8 CWP. Higher pressure was too much. She has been sleeping well.  02/08/12- 39 yoF followed for asthma and allergic rhinitis, complicated by hx of DVT, hx lipomas, DM, and GERD, OSA Follows for: pt reports breathing is doing well; still on allergy vaccine 1:10 GO. She is satisfied that this helps her still  Pt reports wearing CPAP 8/ [employer] (daughter's company) approx 9 hrs per night--states mask is working well. She was recently put on insulin which she blames for rash around the neck and face. I asked her to speak with  Dr. Brigitte Pulse about that.  08/08/12-82 yoF followed for asthma and allergic rhinitis, complicated by hx of DVT, hx lipomas, DM, and GERD, OSA FOLLOWS FOR: still on allergy vaccine 1:10 GO; avoids strong odors. Wears CPAP 8/ [facility] every night for about 6-8 hours-pressure working well for patient. CPAP autotitration suggest good pressure control between 8 and 9.  She is now on 8. Compliance is good but she has been under a lot of stress about her daughter with colon cancer after Crohn's disease.  02/03/13- 36 yoF followed for asthma and allergic rhinitis, OSA/ CPAP complicated by hx of DVT, hx lipomas, DM, and GERD,  FOLLOWS FOR: still on Allergy vaccine 1:10 and doing well; Wears CPAP 8/ [facility] every night; download attached to paperwork. Daughter is a sleep tech.  Download confirms good compliance and control. She reports sleeping well with a nasal pillows mask.  08/04/13- 67 yoF followed for asthma and allergic rhinitis, OSA/ CPAP complicated by hx of DVT, hx lipomas, DM, and GERD,  FOLLOWS FOR: having hoarseness, sneezing, cough, and "snotty". Taking Claritin daily. Still on   Allergy vaccine 1:10 GO and doing well. CPAP 5-15 Auto/ [facility]. Download indicates adequate control and compliance. She wasn't able to wear her CPAP for 3 weeks as she recovered from skin cancer surgery on her nose, but is back at now. She is changing home care companies because her daughter no longer works at [employer]. Manage spring pollen with Claritin. Has had a sinus infection with frontal headache and yellow nasal discharge.  02/02/14-83 yoF followed for asthma and allergic rhinitis, OSA/ CPAP complicated by hx of DVT, hx lipomas, DM, and GERD FOLLOWS FOR: DME is [facility]-continues to wear CPAP 5-15 Auto/ [facility] every night-download attached to OV notes. Also has changes in insurance and medications coming for first of year. Still on Allergy Vaccine 1:10 GO and doing well-will need Epipen Rx refilled.  08/03/14- 56 yoF followed for asthma/ brronchitis and allergic rhinitis, OSA/ CPAP complicated by hx of DVT, hx lipomas, DM, and GERD Reports: still having horseness, sneezing and coughing, pt. is not havin any relief with clairtin would like something else Allergy vaccine 1:10 GO-we discussed options. She can try a different OTC antihistamine like Allegra. CPAP download  confirms excellent compliance and control using AutoPap 5-15 with pressures usually around 8. She has had some problems communicating with [facility] especially about medications for her nebulizer. We are helping her switch to [facility] to manage CPAP and nebulizer supplies.  ROS-See HPI. Constitutional:   No-   weight loss, night sweats, fevers, chills, fatigue, lassitude. HEENT:   +  headaches, no-difficulty swallowing, tooth/dental problems, sore throat,       Little sneezing, itching, ear ache, +nasal congestion, post nasal drip,  CV:  No-   chest pain, orthopnea, PND, swelling in lower extremities, anasarca, dizziness, palpitations Resp: No-   shortness of breath with exertion or at rest.              No-   productive cough,  No non-productive cough,  No- coughing up of blood.              No-   change in color of mucus.  No- wheezing.   Skin: No-   rash or lesions. GI:  No-   heartburn, indigestion, abdominal pain, nausea, vomiting,  GU:  MS:  No-   joint pain or swelling.   Neuro-     nothing unusual Psych:  No- change  in mood or affect. No depression or anxiety.  No memory loss.     Objective:   Physical Exam General- Alert, Oriented, Affect-appropriate, Distress- none acute;                   +obese Skin- clear Lymphadenopathy- none Head- atraumatic            Eyes- Gross vision intact, PERRLA, conjunctivae clear secretions            Ears- Hearing, canals-normal            Nose- minimal turbinate edema, no-Septal dev, mucus, polyps, erosion, perforation             Throat- Mallampati III , mucosa clear , drainage- none, tonsils- atrophic;  +mild chronic hoarseness-unchanged. Neck- flexible , trachea midline, no stridor , thyroid nl, carotid no bruit Chest - symmetrical excursion , unlabored           Heart/CV- RRR , no murmur , no gallop  , no rub, nl s1 s2                           - JVD- none , edema- none, stasis changes- none, varices- none           Lung- clear to P&A,  wheeze- none, cough- none , dullness-none, rub- none           Chest wall-  Abd- Br/ Gen/ Rectal- Not done, not indicated Extrem- cyanosis- none, clubbing, none, atrophy- none, strength- nl. + varices at ankles Neuro- +resting tremor

## 2014-08-03 NOTE — Patient Instructions (Addendum)
We are setting you up with [facility] for your nebulizer medications (Budesonide and Albuterol); they will also take care of all your CPAP needs.   Their address and phone number are as follows:  [address],[place] [address] [phone]   Your pressure setting looks good  We can continue allergy vaccine 1:10 GO

## 2014-08-03 NOTE — Assessment & Plan Note (Signed)
Mild intermittent, uncomplicated, well controlled at this visit Plan-we are refilling albuterol rescue inhaler, albuterol nebulizer solution and budesonide nebulizer solution. Nebulizer meds will be obtained through [facility] since [facility] cannot help with the pharmacy issues.

## 2014-08-03 NOTE — Assessment & Plan Note (Signed)
We are helping her change her DME company from [facility] to [facility] so that she can get help with her nebulizer medications. Pressure AutoSet 5-15 and compliance are doing very well

## 2014-08-11 ENCOUNTER — Telehealth: Payer: Self-pay | Admitting: Internal Medicine

## 2014-08-11 DIAGNOSIS — G4733 Obstructive sleep apnea (adult) (pediatric): Secondary | ICD-10-CM

## 2014-08-11 NOTE — Telephone Encounter (Signed)
Spoke with [name] [name] he reports [facility] is sending her large quanties of her neb medications. She reports they are going to waste. She is not being contacted when they are due to see if she is needing them or not. She wanted me to call [facility] to have them call her before they send out any more medications. Called [facility] and received answering service. WCB

## 2014-08-12 NOTE — Telephone Encounter (Signed)
Called [facility] and LMTCB x1 for [employer]

## 2014-08-13 NOTE — Telephone Encounter (Signed)
Spoke with Maudie Mercury at [facility] and she advised me that she stopped the shipments of the Budesonide. Patients prescription says that she is supposed to be taking Budesonide twice daily and Albuterol every 6 hours as needed.  Patient has been getting the adequate amount of monthly medications, however, patient is only using the medication AS NEEDED, not taking it every day.  Patient is upset because she received another package full of Budesonide and now she has 48 vials of Budesonide that she says will expire before she can use them all.  She said that the package she received was from [facility], not [facility] and she didn't need it, she said she doesn't know how she is going to pay for it.  She is currently using out-dated Albuterol because her daughter told her that if the medication is clear that it is okay to use regardless of expiration date.  She also requested nasal pillows.  I put order in for Nasal pillows.    Allergies  Allergen Reactions  . Aspirin   . Codeine   . Ibuprofen   . Meperidine Hcl   . Augmentin [Amoxicillin-Pot Clavulanate]     GI upset   Current Outpatient Prescriptions on File Prior to Visit  Medication Sig Dispense Refill  . albuterol (PROAIR HFA) 108 (90 BASE) MCG/ACT inhaler 2 puffs every 4-6 hours as directed- rescue 1 Inhaler prn  . albuterol (PROVENTIL) (2.5 MG/3ML) 0.083% nebulizer solution Take 3 mLs (2.5 mg total) by nebulization every 6 (six) hours as needed for wheezing or shortness of breath. DX J45.998 120 vial 12  . amLODipine (NORVASC) 5 MG tablet Take 5 mg by mouth daily.      . Blood Glucose Monitoring Suppl (ONE TOUCH ULTRA SYSTEM KIT) W/DEVICE KIT 1 kit by Does not apply route once.      . budesonide (PULMICORT) 0.25 MG/2ML nebulizer solution Take 2 mLs (0.25 mg total) by nebulization 2 (two) times daily. DX J45.998 120 mL 12  . calcium-vitamin D (OSCAL WITH D) 500-200 MG-UNIT per tablet Take 2 by mouth once daily     . Cholecalciferol (VITAMIN D3) 3000 UNITS TABS Take  1 capsule by mouth daily.    . cloNIDine (CATAPRES) 0.1 MG tablet Take 0.1 mg by mouth 3 (three) times daily.     . diazepam (VALIUM) 5 MG tablet Take 5 mg by mouth 2 (two) times daily.     . diclofenac sodium (VOLTAREN) 1 % GEL Apply topically.    Marland Kitchen EPINEPHrine 0.3 mg/0.3 mL IJ SOAJ injection Inject into thigh muscle for severe allergic reaction 1 Device prn  . ezetimibe-simvastatin (VYTORIN) 10-40 MG per tablet Take 1 tablet by mouth at bedtime.      Marland Kitchen glimepiride (AMARYL) 4 MG tablet Take 4 mg by mouth as needed.     . insulin glargine (LANTUS) 100 UNIT/ML injection Inject 30 Units into the skin. Every day at 3pm    . Lancets MISC by Does not apply route daily.      Marland Kitchen LANTUS SOLOSTAR 100 UNIT/ML Solostar Pen   4  . linagliptin (TRADJENTA) 5 MG TABS tablet Take 5 mg by mouth daily.    Marland Kitchen Lysine 500 MG TABS Take by mouth. Take 2 daily     . metoprolol (LOPRESSOR) 50 MG tablet Take 1 tablet by mouth 2 (two) times daily.    . multivitamin-lutein (OCUVITE-LUTEIN) CAPS Take 1 capsule by mouth daily.      Marland Kitchen omeprazole (PRILOSEC) 20 MG capsule Take 20 mg  by mouth daily.      . ONE TOUCH ULTRA TEST test strip   8  . telmisartan (MICARDIS) 80 MG tablet Take 1 tablet by mouth daily.    . traMADol (ULTRAM) 50 MG tablet Take 50 mg by mouth every 8 (eight) hours as needed.    . vitamin B-12 (CYANOCOBALAMIN) 1000 MCG tablet Take 1,000 mcg by mouth daily.      Marland Kitchen warfarin (COUMADIN) 3 MG tablet     . warfarin (COUMADIN) 6 MG tablet Take as directed     No current facility-administered medications on file prior to visit.

## 2014-08-13 NOTE — Telephone Encounter (Signed)
Our last Budesonide script was to [facility] on 5/24.  Suggest we cancel that one Reduce the amount per month to half as much on her DME script.

## 2014-08-16 NOTE — Telephone Encounter (Signed)
Pt states that she needs 1/2 amount of Albuterol Nebulizer be sent to her asap Pt does not need any Budesonide Nebulizer at this time- requests that this be put on hold, she will call when she needs a refill.  Pt also needs CPAP pillows- has not received these yet.  Called [facility] [phone] to give verbal changes and request CPAP supplies - [facility] reps are in a meeting until 10:30 Will await call back

## 2014-08-17 NOTE — Telephone Encounter (Signed)
Will call [facility] on 6/8 as office is now closed.

## 2014-08-18 MED ORDER — ALBUTEROL SULFATE (2.5 MG/3ML) 0.083% IN NEBU: 2.5000 mg | INHALATION_SOLUTION | Freq: Four times a day (QID) | RESPIRATORY_TRACT | Status: DC | PRN

## 2014-08-18 MED ORDER — BUDESONIDE 0.25 MG/2ML IN SUSP: 0.2500 mg | Freq: Two times a day (BID) | RESPIRATORY_TRACT | Status: DC

## 2014-08-18 NOTE — Telephone Encounter (Signed)
Spoke with Jeani Hawking at [facility], aware of med changes and nasal pillow changes.  States they have ordered the particular nasal pillows that the pt needs for her mask and will be shipped to her once they are received.  Pt has been contacted by [facility] and is aware of all of this.  Nothing further needed at this time.

## 2014-09-01 ENCOUNTER — Ambulatory Visit (INDEPENDENT_AMBULATORY_CARE_PROVIDER_SITE_OTHER): Payer: Medicare Other

## 2014-09-01 ENCOUNTER — Telehealth: Payer: Self-pay | Admitting: Internal Medicine

## 2014-09-01 DIAGNOSIS — J309 Allergic rhinitis, unspecified: Secondary | ICD-10-CM | POA: Diagnosis not present

## 2014-09-24 NOTE — Telephone Encounter (Signed)
Date Mixed: 09/01/14 Vial: 2 Strength: 1:10 Here/Mail/Pick Up: mail Mixed By: Laurette Schimke

## 2014-10-05 ENCOUNTER — Telehealth: Payer: Self-pay | Admitting: Internal Medicine

## 2014-10-05 DIAGNOSIS — G4733 Obstructive sleep apnea (adult) (pediatric): Secondary | ICD-10-CM

## 2014-10-05 NOTE — Telephone Encounter (Signed)
Spoke with pt, states she is wanting to switch from [facility] to [facility] that [facility] is sending a fax to our office for a new rx for nasal pillows.  Pt is wanting to switch back d/t poor customer service per pt.   Spoke with Aiyana at [facility], states that we would just need to send a new order for supplies.   Dr. Annamaria Boots are you ok with this order?  Thanks!

## 2014-10-06 NOTE — Telephone Encounter (Signed)
CY please advise if you are okay with this order. Thanks.

## 2014-10-06 NOTE — Telephone Encounter (Signed)
Yes, ok to switch  

## 2014-10-06 NOTE — Telephone Encounter (Signed)
Order placed.  Nothing further needed. Pt advised order was placed.

## 2014-10-29 ENCOUNTER — Encounter: Payer: Self-pay | Admitting: Internal Medicine

## 2014-12-16 ENCOUNTER — Telehealth: Payer: Self-pay | Admitting: Internal Medicine

## 2014-12-16 ENCOUNTER — Ambulatory Visit (INDEPENDENT_AMBULATORY_CARE_PROVIDER_SITE_OTHER): Payer: Medicare Other

## 2014-12-16 DIAGNOSIS — J309 Allergic rhinitis, unspecified: Secondary | ICD-10-CM

## 2014-12-16 NOTE — Telephone Encounter (Signed)
Allergy Serum Extract Date Mixed: 12/16/14 Vial: 2 Strength: 1:10 Here/Mail/Pick Up: mail Mixed By: Laurette Schimke

## 2014-12-21 ENCOUNTER — Other Ambulatory Visit: Payer: Self-pay | Admitting: Internal Medicine

## 2014-12-21 DIAGNOSIS — N6320 Unspecified lump in the left breast, unspecified quadrant: Secondary | ICD-10-CM

## 2015-01-24 ENCOUNTER — Ambulatory Visit
Admission: RE | Admit: 2015-01-24 | Discharge: 2015-01-24 | Disposition: A | Discharge status: Home / Self Care | Payer: Medicare Other | Source: Ambulatory Visit | Attending: Internal Medicine | Admitting: Internal Medicine

## 2015-01-24 ENCOUNTER — Other Ambulatory Visit: Payer: Self-pay | Admitting: Internal Medicine

## 2015-01-24 DIAGNOSIS — N6320 Unspecified lump in the left breast, unspecified quadrant: Secondary | ICD-10-CM

## 2015-04-15 ENCOUNTER — Telehealth: Payer: Self-pay | Admitting: Internal Medicine

## 2015-04-15 DIAGNOSIS — J309 Allergic rhinitis, unspecified: Secondary | ICD-10-CM | POA: Diagnosis not present

## 2015-04-15 NOTE — Telephone Encounter (Signed)
Allergy Serum Extract Date Mixed: 04/15/15 Vial: 2 Strength: 1:10 Here/Mail/Pick Up: mail Mixed By: tbs Last OV: 08/03/14 Pending OV: 08/03/15

## 2015-07-01 ENCOUNTER — Other Ambulatory Visit: Payer: Self-pay | Admitting: Internal Medicine

## 2015-07-01 DIAGNOSIS — N6320 Unspecified lump in the left breast, unspecified quadrant: Secondary | ICD-10-CM

## 2015-07-20 ENCOUNTER — Ambulatory Visit
Admission: RE | Admit: 2015-07-20 | Discharge: 2015-07-20 | Disposition: A | Discharge status: Home / Self Care | Payer: Medicare Other | Source: Ambulatory Visit | Attending: Internal Medicine | Admitting: Internal Medicine

## 2015-07-20 DIAGNOSIS — N6320 Unspecified lump in the left breast, unspecified quadrant: Secondary | ICD-10-CM

## 2015-08-03 ENCOUNTER — Encounter: Payer: Self-pay | Admitting: Internal Medicine

## 2015-08-03 ENCOUNTER — Ambulatory Visit (INDEPENDENT_AMBULATORY_CARE_PROVIDER_SITE_OTHER): Payer: Medicare Other | Admitting: Internal Medicine

## 2015-08-03 VITALS — BP 142/70 | HR 61 | Ht 64.0 in | Wt 227.8 lb

## 2015-08-03 DIAGNOSIS — J45909 Unspecified asthma, uncomplicated: Secondary | ICD-10-CM

## 2015-08-03 DIAGNOSIS — J309 Allergic rhinitis, unspecified: Secondary | ICD-10-CM | POA: Diagnosis not present

## 2015-08-03 DIAGNOSIS — J3089 Other allergic rhinitis: Secondary | ICD-10-CM

## 2015-08-03 DIAGNOSIS — J449 Chronic obstructive pulmonary disease, unspecified: Secondary | ICD-10-CM

## 2015-08-03 DIAGNOSIS — G4733 Obstructive sleep apnea (adult) (pediatric): Secondary | ICD-10-CM | POA: Diagnosis not present

## 2015-08-03 DIAGNOSIS — J302 Other seasonal allergic rhinitis: Secondary | ICD-10-CM

## 2015-08-03 MED ORDER — ALBUTEROL SULFATE (2.5 MG/3ML) 0.083% IN NEBU: 2.5000 mg | INHALATION_SOLUTION | Freq: Four times a day (QID) | RESPIRATORY_TRACT | Status: DC | PRN

## 2015-08-03 NOTE — Assessment & Plan Note (Signed)
We will clarify orders to get CPAP supplies through [facility].

## 2015-08-03 NOTE — Assessment & Plan Note (Signed)
We discussed allergy vaccine. Can stop any time but anticipate continuing until the allergy clinic closes next year.

## 2015-08-03 NOTE — Progress Notes (Signed)
Patient ID: [name] [name], female    DOB: 1930/03/02, 82 y.o.   MRN: [id number]  HPI 06/27/10-80 yoF followed for asthma and allergic rhinitis, complicated by hx of DVT, hx lipomas and GERD. Last here 12/27/09. She was seen by NP here for acute bronchitis with good response, then had Norovirus GI. She had f/u MRI for known lipoma in neck for which she is supposed to have traction/ Dr Joya Salm.  Seasonal pollens causing some frontal headache, sneeze, itch and rhinorhea. Drip makes her cough w/o wheeze. Ventolin will stop the cough. She continues allergy vaccine. Continues Zyrtec and plans to try Claritin. Some epistaxis. Review of Systems See HPI  12/27/10- 80 yoF followed for asthma and allergic rhinitis, complicated by hx of DVT, hx lipomas, DM, DVT and GERD Dx'd w/ cervical spondylosis. Sinus pressure headache feels better since changed from a pill to a patch- Clonidine 0.1mg . Can't afford generic singulair. Continues allergy vaccine without problems. Says this was a rough late summer and early fall because of pollens" dry hot weather", but she feels much better now. She switched back to see her today is her preferred antihistamine. Her daughter is concerned about obstructive sleep apnea potentially contributing to difficulty managing blood pressure and diabetes. She would like a sleep study done. She's not sure if she snores but does admit daytime sleepiness.  02/07/11- 80 yoF followed for asthma and allergic rhinitis, complicated by hx of DVT, hx lipomas, DM, DVT and GERD, OSA NPSG 01/15/11- confirmed mild OSA 7.6/hr. We discussed in the context of her weight and blood sugar.  She has been sleeping only on her sides. Her daughter manages a DME company providing CPAP and is the one she wants to manage her with this treatment trial. Meanwhile she continues allergy vaccine without problem and feels it helps.  04/10/11- 80 yoF followed for asthma and allergic rhinitis, complicated by hx of DVT, hx  lipomas, DM, DVT and GERD, OSA Doing very well with CPAP Auto- daughter [name] [name] works for [employer] and has done a good job of helping get started. Nasal pillows mask. Wears all night, every night. Wakes only once 2AM bathroom, then right back to sleep. More alert in daytime.  Download confirms good compliance- suggests 9 cwp. Asthma control very good. No wheeze or cough, little need for rescue meds as she continues budesonide. Nasal congestion improved w/ humidifier adjustment on CPAP- no pollen rhinitis, itch or sneeze. Continues allergy vaccine.   08/08/11- 78 yoF followed for asthma and allergic rhinitis, complicated by hx of DVT, hx lipomas, DM, and GERD, OSA Denies any SOB, wheezing, cough, or congestion;still on vaccine, Uses CPAP every night for approx 9 hours  Continues to do well with her allergy vaccine at 1:10. But did notice increased sneeze and postnasal drainage this spring so she stayed indoors.  Had a good winter. She remains on chronic Coumadin. CPAP download indicates excellent compliance and control of 8 CWP. Higher pressure was too much. She has been sleeping well.  02/08/12- 20 yoF followed for asthma and allergic rhinitis, complicated by hx of DVT, hx lipomas, DM, and GERD, OSA Follows for: pt reports breathing is doing well; still on allergy vaccine 1:10 GO. She is satisfied that this helps her still  Pt reports wearing CPAP 8/ [employer] (daughter's company) approx 9 hrs per night--states mask is working well. She was recently put on insulin which she blames for rash around the neck and face. I asked her to speak with  Dr. Brigitte Pulse about that.  08/08/12-82 yoF followed for asthma and allergic rhinitis, complicated by hx of DVT, hx lipomas, DM, and GERD, OSA FOLLOWS FOR: still on allergy vaccine 1:10 GO; avoids strong odors. Wears CPAP 8/ [facility] every night for about 6-8 hours-pressure working well for patient. CPAP autotitration suggest good pressure control between 8 and 9.  She is now on 8. Compliance is good but she has been under a lot of stress about her daughter with colon cancer after Crohn's disease.  02/03/13- 32 yoF followed for asthma and allergic rhinitis, OSA/ CPAP complicated by hx of DVT, hx lipomas, DM, and GERD,  FOLLOWS FOR: still on Allergy vaccine 1:10 and doing well; Wears CPAP 8/ [facility] every night; download attached to paperwork. Daughter is a sleep tech.  Download confirms good compliance and control. She reports sleeping well with a nasal pillows mask.  08/04/13- 41 yoF followed for asthma and allergic rhinitis, OSA/ CPAP complicated by hx of DVT, hx lipomas, DM, and GERD,  FOLLOWS FOR: having hoarseness, sneezing, cough, and "snotty". Taking Claritin daily. Still on   Allergy vaccine 1:10 GO and doing well. CPAP 5-15 Auto/ [facility]. Download indicates adequate control and compliance. She wasn't able to wear her CPAP for 3 weeks as she recovered from skin cancer surgery on her nose, but is back at now. She is changing home care companies because her daughter no longer works at [employer]. Manage spring pollen with Claritin. Has had a sinus infection with frontal headache and yellow nasal discharge.  02/02/14-83 yoF followed for asthma and allergic rhinitis, OSA/ CPAP complicated by hx of DVT, hx lipomas, DM, and GERD FOLLOWS FOR: DME is [facility]-continues to wear CPAP 5-15 Auto/ [facility] every night-download attached to OV notes. Also has changes in insurance and medications coming for first of year. Still on Allergy Vaccine 1:10 GO and doing well-will need Epipen Rx refilled.  08/03/14- 68 yoF followed for asthma/ brronchitis and allergic rhinitis, OSA/ CPAP complicated by hx of DVT, hx lipomas, DM, and GERD Reports: still having horseness, sneezing and coughing, pt. is not havin any relief with clairtin would like something else Allergy vaccine 1:10 GO-we discussed options. She can try a different OTC antihistamine like Allegra. CPAP download  confirms excellent compliance and control using AutoPap 5-15 with pressures usually around 8. She has had some problems communicating with [facility] especially about medications for her nebulizer. We are helping her switch to [facility] to manage CPAP and nebulizer supplies.  08/03/2015-82 year old female never smoker followed for asthma/bronchitis, allergic rhinitis, OSA/CPAP, complicated by history DVT, history lipomas, DM, GERD Allergy Vaccine 1:10 GO CPAP auto 5-15/ [facility]    down load okay FOLLOW FOR: Allergies, Sleep Apnea.  doing well on CPAP.  Patient states Budesonide was over $700.  Pt says that she never received the Albuterol.  Has to get nebulizer solution from drugstore because of her insurance. She is still getting solicitation's from [place] so we need to clear that up because she now uses [facility]. Budesonide nebulizer solution too expensive and not clearly helpful.   ROS-See HPI. Constitutional:   No-   weight loss, night sweats, fevers, chills, fatigue, lassitude. HEENT:   +  headaches, no-difficulty swallowing, tooth/dental problems, sore throat,       Little sneezing, itching, ear ache, +nasal congestion, post nasal drip,  CV:  No-   chest pain, orthopnea, PND, swelling in lower extremities, anasarca, dizziness, palpitations Resp: No-   shortness of breath with exertion  or at rest.              No-   productive cough,  No non-productive cough,  No- coughing up of blood.              No-   change in color of mucus.  No- wheezing.   Skin: No-   rash or lesions. GI:  No-   heartburn, indigestion, abdominal pain, nausea, vomiting,  GU:  MS:  No-   joint pain or swelling.   Neuro-     nothing unusual Psych:  No- change in mood or affect. No depression or anxiety.  No memory loss.     Objective:   Physical Exam General- Alert, Oriented, Affect-appropriate, Distress- none acute,  +obese Skin- clear Lymphadenopathy- none Head- atraumatic            Eyes- Gross vision  intact, PERRLA, conjunctivae clear secretions            Ears- Hearing, canals-normal            Nose- minimal turbinate edema, no-Septal dev, mucus, polyps, erosion, perforation             Throat- Mallampati III , mucosa clear , drainage- none, tonsils- atrophic;  +mild chronic hoarseness-unchanged. Neck- flexible , trachea midline, no stridor , thyroid nl, carotid no bruit Chest - symmetrical excursion , unlabored           Heart/CV- RRR , no murmur , no gallop  , no rub, nl s1 s2                           - JVD- none , edema- none, stasis changes- none, varices- none           Lung- clear to P&A, wheeze- none, cough- none , dullness-none, rub- none           Chest wall-  Abd- Br/ Gen/ Rectal- Not done, not indicated Extrem- cyanosis- none, clubbing, none, atrophy- none, strength- nl. + varices at ankles Neuro- +resting tremor

## 2015-08-03 NOTE — Assessment & Plan Note (Signed)
Good control at this visit without recent exacerbation. She is using nebulizer machine about once a day. I don't think she needs budesonide to justify the cost in her nebulizer solution. Plan-DC budesonide and observe for stability

## 2015-08-03 NOTE — Patient Instructions (Signed)
Order- DME- D/C [facility]  For CPAP   She is not using them now.  Order- DME [facility]-    Continue CPAP auto 5-15, mask of choice, supplies, humidifier, AirView   Dx OSA                        Script printed to continue albuterol neb solution - should be able to go through [employer] affiliated with [facility]. Need to verify this.

## 2015-08-17 ENCOUNTER — Telehealth: Payer: Self-pay | Admitting: Internal Medicine

## 2015-08-17 DIAGNOSIS — J309 Allergic rhinitis, unspecified: Secondary | ICD-10-CM | POA: Diagnosis not present

## 2015-08-17 NOTE — Telephone Encounter (Signed)
Allergy Serum Extract Date Mixed: 08/17/15 Vial: 2 Strength: 1:10 Here/Mail/Pick Up: mail Mixed By: tbs Last OV: 08/03/15 Pending OV: N/A

## 2015-12-16 ENCOUNTER — Telehealth: Payer: Self-pay | Admitting: Internal Medicine

## 2015-12-16 DIAGNOSIS — J309 Allergic rhinitis, unspecified: Secondary | ICD-10-CM | POA: Diagnosis not present

## 2015-12-16 NOTE — Telephone Encounter (Signed)
Allergy Serum Extract Date Mixed: 12/16/15 Vial: 2 Strength: 1:10 Here/Mail/Pick Up: mail Mixed By: tbs Last OV: 08/02/13 Pending OV: N/A

## 2016-08-01 ENCOUNTER — Encounter: Payer: Self-pay | Admitting: Internal Medicine

## 2016-08-02 ENCOUNTER — Encounter: Payer: Self-pay | Admitting: Internal Medicine

## 2016-08-02 ENCOUNTER — Ambulatory Visit (INDEPENDENT_AMBULATORY_CARE_PROVIDER_SITE_OTHER): Payer: Medicare Other | Admitting: Internal Medicine

## 2016-08-02 DIAGNOSIS — J3089 Other allergic rhinitis: Secondary | ICD-10-CM

## 2016-08-02 DIAGNOSIS — J302 Other seasonal allergic rhinitis: Secondary | ICD-10-CM | POA: Diagnosis not present

## 2016-08-02 DIAGNOSIS — J4530 Mild persistent asthma, uncomplicated: Secondary | ICD-10-CM | POA: Diagnosis not present

## 2016-08-02 DIAGNOSIS — G4733 Obstructive sleep apnea (adult) (pediatric): Secondary | ICD-10-CM | POA: Diagnosis not present

## 2016-08-02 DIAGNOSIS — I872 Venous insufficiency (chronic) (peripheral): Secondary | ICD-10-CM

## 2016-08-02 MED ORDER — ALBUTEROL SULFATE HFA 108 (90 BASE) MCG/ACT IN AERS: INHALATION_SPRAY | RESPIRATORY_TRACT | 99 refills | Status: DC

## 2016-08-02 NOTE — Progress Notes (Signed)
Patient ID: [name] [name], female    DOB: [birth year], 83 y.o.   MRN: [id number]  HPI female never smoker followed for asthma/bronchitis, allergic rhinitis, OSA/CPAP, complicated by history DVT, history lipomas, DM, GERD NPSG 01/15/11- confirmed mild OSA 7.6/hr ---------------------------------------------------------------------------------   08/03/2015-83 year old female never smoker followed for asthma/bronchitis, allergic rhinitis, OSA/CPAP, complicated by history DVT, history lipomas, DM, GERD Allergy Vaccine 1:10 GO CPAP auto 5-15/ [facility]    down load okay FOLLOW FOR: Allergies, Sleep Apnea.  doing well on CPAP.  Patient states Budesonide was over $700.  Pt says that she never received the Albuterol.  Has to get nebulizer solution from drugstore because of her insurance. She is still getting solicitation's from [place] so we need to clear that up because she now uses [facility]. Budesonide nebulizer solution too expensive and not clearly helpful.  08/02/16-83 year old female never smoker followed for asthma/bronchitis, allergic rhinitis, OSA/CPAP, complicated by history DVT, lipomas, DM, GERD CPAP auto 5-15/[facility] FOLLOW UP FOR patient is here for her 1 year follow up DME is  [facility]  patient is sleeping about 9 hours a night with her CPAP  Download very good-98% for our compliance, AHI 0.9/hour. Complains of feeling "worn out". [name] [name] had stroke and she is having to care for him. Increased use of rescue inhaler but not needing nebulizer machine recently. No acute events or infections.   ROS-See HPI. Constitutional:   No-   weight loss, night sweats, fevers, chills, fatigue, lassitude. HEENT:   +  headaches, no-difficulty swallowing, tooth/dental problems, sore throat,       Little sneezing, itching, ear ache, +nasal congestion, post nasal drip,  CV:  No-   chest pain, orthopnea, PND, swelling in lower extremities, anasarca, dizziness, palpitations Resp: + shortness of breath with  exertion or at rest.              No-   productive cough,  No non-productive cough,  No- coughing up of blood.              No-   change in color of mucus.  No- wheezing.   Skin: No-   rash or lesions. GI:  No-   heartburn, indigestion, abdominal pain, nausea, vomiting,  GU:  MS:  No-   joint pain or swelling.   Neuro-    + tremor Psych:  No- change in mood or affect. No depression or anxiety.  No memory loss.     Objective:   Physical Exam General- Alert, Oriented, Affect-appropriate, Distress- none acute,  +obese Skin- clear Lymphadenopathy- none Head- atraumatic            Eyes- Gross vision intact, PERRLA, conjunctivae clear secretions            Ears- Hearing, canals-normal            Nose- minimal turbinate edema, no-Septal dev, mucus, polyps, erosion, perforation             Throat- Mallampati III , mucosa clear , drainage- none, tonsils- atrophic;                         +mild chronic hoarseness-unchanged. Neck- flexible , trachea midline, no stridor , thyroid nl, carotid no bruit Chest - symmetrical excursion , unlabored           Heart/CV- RRR , no murmur , no gallop  , no rub, nl s1 s2                           -  JVD- none , edema- none, stasis changes- none, varices- none           Lung- clear to P&A, wheeze- none, cough- none , dullness-none, rub- none           Chest wall-  Abd- Br/ Gen/ Rectal- Not done, not indicated Extrem- cyanosis- none, clubbing, none, atrophy- none, strength- nl. + varices at ankles, + elastic hose Neuro- +resting tremor

## 2016-08-02 NOTE — Patient Instructions (Signed)
DME [facility] continue CPAP auto 5-15, mask of choice, humidifier, supplies, AirView    Dx OSA  Script sent refilling ProAir rescue inhaler  Please call if we can help

## 2016-08-06 NOTE — Assessment & Plan Note (Signed)
Control is adequate. We discussed use of rescue inhaler and criteria for adding a maintenance controller if needed. For now we will update prescription for her ProAir

## 2016-08-06 NOTE — Assessment & Plan Note (Signed)
Allergy vaccine discontinued now. We discussed use of Flonase and antihistamines if needed.

## 2016-08-06 NOTE — Assessment & Plan Note (Signed)
Chronic peripheral edema for which she wears elastic hose-post thrombolytic venous insufficiency

## 2016-08-06 NOTE — Assessment & Plan Note (Signed)
Good continued compliance and control with CPAP. Auto pressure ranges comfortable. She is clearly better off with CPAP.

## 2017-03-12 DIAGNOSIS — R55 Syncope and collapse: Secondary | ICD-10-CM

## 2017-03-12 HISTORY — DX: Syncope and collapse: R55

## 2017-07-07 ENCOUNTER — Emergency Department (HOSPITAL_COMMUNITY): Payer: Medicare Other

## 2017-07-07 ENCOUNTER — Encounter (HOSPITAL_COMMUNITY): Payer: Self-pay | Admitting: Emergency Medicine

## 2017-07-07 ENCOUNTER — Emergency Department (HOSPITAL_COMMUNITY)
Admission: EM | Admit: 2017-07-07 | Discharge: 2017-07-08 | Disposition: A | Discharge status: Home / Self Care | Payer: Medicare Other | Attending: Emergency Medicine | Admitting: Emergency Medicine

## 2017-07-07 DIAGNOSIS — Y92009 Unspecified place in unspecified non-institutional (private) residence as the place of occurrence of the external cause: Secondary | ICD-10-CM | POA: Diagnosis not present

## 2017-07-07 DIAGNOSIS — E119 Type 2 diabetes mellitus without complications: Secondary | ICD-10-CM | POA: Insufficient documentation

## 2017-07-07 DIAGNOSIS — J45909 Unspecified asthma, uncomplicated: Secondary | ICD-10-CM | POA: Insufficient documentation

## 2017-07-07 DIAGNOSIS — Y9301 Activity, walking, marching and hiking: Secondary | ICD-10-CM | POA: Insufficient documentation

## 2017-07-07 DIAGNOSIS — I10 Essential (primary) hypertension: Secondary | ICD-10-CM | POA: Insufficient documentation

## 2017-07-07 DIAGNOSIS — W19XXXA Unspecified fall, initial encounter: Secondary | ICD-10-CM

## 2017-07-07 DIAGNOSIS — Z79899 Other long term (current) drug therapy: Secondary | ICD-10-CM | POA: Diagnosis not present

## 2017-07-07 DIAGNOSIS — Y999 Unspecified external cause status: Secondary | ICD-10-CM | POA: Diagnosis not present

## 2017-07-07 DIAGNOSIS — R55 Syncope and collapse: Secondary | ICD-10-CM | POA: Diagnosis present

## 2017-07-07 DIAGNOSIS — Z794 Long term (current) use of insulin: Secondary | ICD-10-CM | POA: Diagnosis not present

## 2017-07-07 DIAGNOSIS — S92901A Unspecified fracture of right foot, initial encounter for closed fracture: Secondary | ICD-10-CM | POA: Insufficient documentation

## 2017-07-07 DIAGNOSIS — J302 Other seasonal allergic rhinitis: Secondary | ICD-10-CM | POA: Insufficient documentation

## 2017-07-07 DIAGNOSIS — X58XXXA Exposure to other specified factors, initial encounter: Secondary | ICD-10-CM | POA: Diagnosis not present

## 2017-07-07 LAB — URINALYSIS, ROUTINE W REFLEX MICROSCOPIC
BILIRUBIN URINE: NEGATIVE
Glucose, UA: 50 mg/dL — AB
Hgb urine dipstick: NEGATIVE
Ketones, ur: NEGATIVE mg/dL
LEUKOCYTES UA: NEGATIVE
NITRITE: NEGATIVE
PROTEIN: NEGATIVE mg/dL
Specific Gravity, Urine: 1.012 (ref 1.005–1.030)
pH: 6 (ref 5.0–8.0)

## 2017-07-07 LAB — COMPREHENSIVE METABOLIC PANEL
ALBUMIN: 3.7 g/dL (ref 3.5–5.0)
ALT: 18 U/L (ref 14–54)
ANION GAP: 8 (ref 5–15)
AST: 26 U/L (ref 15–41)
Alkaline Phosphatase: 55 U/L (ref 38–126)
BILIRUBIN TOTAL: 0.7 mg/dL (ref 0.3–1.2)
BUN: 50 mg/dL — ABNORMAL HIGH (ref 6–20)
CHLORIDE: 109 mmol/L (ref 101–111)
CO2: 24 mmol/L (ref 22–32)
Calcium: 10.2 mg/dL (ref 8.9–10.3)
Creatinine, Ser: 2.15 mg/dL — ABNORMAL HIGH (ref 0.44–1.00)
GFR calc Af Amer: 23 mL/min — ABNORMAL LOW (ref >60)
GFR calc non Af Amer: 20 mL/min — ABNORMAL LOW (ref >60)
GLUCOSE: 225 mg/dL — AB (ref 65–99)
POTASSIUM: 4.3 mmol/L (ref 3.5–5.1)
SODIUM: 141 mmol/L (ref 135–145)
TOTAL PROTEIN: 6.4 g/dL — AB (ref 6.5–8.1)

## 2017-07-07 LAB — CBG MONITORING, ED: Glucose-Capillary: 190 mg/dL — ABNORMAL HIGH (ref 65–99)

## 2017-07-07 LAB — CBC WITH DIFFERENTIAL/PLATELET
BASOS ABS: 0 10*3/uL (ref 0.0–0.1)
Basophils Relative: 0 %
Eosinophils Absolute: 0 10*3/uL (ref 0.0–0.7)
Eosinophils Relative: 0 %
HCT: 36 % (ref 36.0–46.0)
Hemoglobin: 11.5 g/dL — ABNORMAL LOW (ref 12.0–15.0)
LYMPHS ABS: 1.7 10*3/uL (ref 0.7–4.0)
LYMPHS PCT: 17 %
MCH: 29.1 pg (ref 26.0–34.0)
MCHC: 31.9 g/dL (ref 30.0–36.0)
MCV: 91.1 fL (ref 78.0–100.0)
MONO ABS: 0.3 10*3/uL (ref 0.1–1.0)
Monocytes Relative: 3 %
Neutro Abs: 8 10*3/uL — ABNORMAL HIGH (ref 1.7–7.7)
Neutrophils Relative %: 80 %
Platelets: 154 10*3/uL (ref 150–400)
RBC: 3.95 MIL/uL (ref 3.87–5.11)
RDW: 13.4 % (ref 11.5–15.5)
WBC: 10 10*3/uL (ref 4.0–10.5)

## 2017-07-07 LAB — I-STAT CG4 LACTIC ACID, ED: Lactic Acid, Venous: 1.39 mmol/L (ref 0.5–1.9)

## 2017-07-07 MED ORDER — SODIUM CHLORIDE 0.9 % IV BOLUS
2000.0000 mL | Freq: Once | INTRAVENOUS | Status: AC
  Administered 2017-07-07: 2000 mL via INTRAVENOUS

## 2017-07-07 NOTE — ED Triage Notes (Signed)
Pt arrived via [facility] after syncopal episode with a fall. EMS reports a positive loss of consciousness. EMS reports hypertensive on scene with an initial pressure of 240/110. Current pressure 220/83. EMS reports 12 lead unremarkable and lead II ECG of 1st degree AV block. EMS started a 20g in pt's right hand. EMS reports right ankle pain, left groin pain, right hip pain, bilateral shoulder pain, and lower back pain.   Pt states she doesn't remember anything other then waking up with the alarm system going of. Pt complains of pain to the right ankle pain, right hip pain, left groin pain, bilateral shoulder pain, and lower back pain.

## 2017-07-07 NOTE — ED Provider Notes (Signed)
[facility]ERGENCY DEPARTMENT Provider Note   CSN: [id number] Arrival date & time: 07/07/17  7169     History   Chief Complaint Chief Complaint  Patient presents with  . Pain in multiple Areas    Right ankle, right hip, lower back, back, left side of groin  . Hypertension  . Fall    HPI [name] [name] is a 84 y.o. female.  Patient arrives by EMS after she activated her fall alert necklace.  She feels like she passed out while walking in her home.  She awoke on the floor and laid there for 30 minutes before help was able to arrive and transport her here.  She lives with her husband who is debilitated.  She had no warning for the fall or recent illness.  She denies fever, chills, nausea, vomiting, cough, shortness of breath or chest pain.  She injured her right ankle in the fall.  She has pain in her right hip, and both shoulders.  She denies headache, neck pain or back pain.  There are no other known modifying factors.  HPI  Past Medical History:  Diagnosis Date  . Allergic rhinitis, cause unspecified   . Asthma   . Esophageal reflux   . Type II or unspecified type diabetes mellitus without mention of complication, not stated as uncontrolled   . Unspecified essential hypertension     Patient Active Problem List   Diagnosis Date Noted  . Acute frontal sinusitis 09/20/2013  . Obstructive sleep apnea 02/08/2011  . RENAL INSUFFICIENCY 09/29/2007  . DIABETES, TYPE 2 03/31/2007  . OBESITY 03/31/2007  . HYPERTENSION 03/31/2007  . Venous insufficiency (chronic) (peripheral) 03/31/2007  . Asthmatic bronchitis , chronic ([place]) 03/31/2007  . Seasonal and perennial allergic rhinitis 03/31/2007  . Asthma, mild persistent 03/31/2007  . G E R D 03/31/2007    Past Surgical History:  Procedure Laterality Date  . APPENDECTOMY    . FIXATION KYPHOPLASTY LUMBAR SPINE    . HIATAL HERNIA REPAIR    . LUMBAR SPINE SURGERY    . TUBAL LIGATION    . VAGINAL  HYSTERECTOMY       OB History   None      Home Medications    Prior to Admission medications   Medication Sig Start Date End Date Taking? Authorizing Provider  calcium carbonate (OS-CAL - DOSED IN MG OF ELEMENTAL CALCIUM) 1250 (500 Ca) MG tablet Take 1 tablet by mouth 2 (two) times daily with a meal.   Yes [provider]  furosemide (LASIX) 40 MG tablet Take 40 mg by mouth daily.   Yes [provider]  hydrALAZINE (APRESOLINE) 50 MG tablet Take 50 mg by mouth 3 (three) times daily.   Yes [provider]  Multiple Vitamins-Minerals (PRESERVISION AREDS 2 PO) Take 1 tablet by mouth 2 (two) times daily.   Yes [provider]  vitamin B-12 (CYANOCOBALAMIN) 1000 MCG tablet Take 1,000 mcg by mouth daily.     Yes [provider]  warfarin (COUMADIN) 3 MG tablet Take 6-9 mg by mouth daily. Monday, Wednesday and Friday take 9 mg all other days take 6 mg. 07/20/14  Yes [provider]  albuterol (PROAIR HFA) 108 (90 Base) MCG/ACT inhaler 2 puffs every 4-6 hours as directed- rescue 08/02/16   Baird Lyons D, MD  albuterol (PROVENTIL) (2.5 MG/3ML) 0.083% nebulizer solution Take 3 mLs (2.5 mg total) by nebulization every 6 (six) hours as needed for wheezing or shortness of breath.  DX [id number] Patient not taking: Reported on 08/02/2016 08/03/15   Baird Lyons D, MD  amLODipine (NORVASC) 5 MG tablet Take 5 mg by mouth daily.      [provider]  Blood Glucose Monitoring Suppl (ONE TOUCH ULTRA SYSTEM KIT) W/DEVICE KIT 1 kit by Does not apply route once.      [provider]  calcium-vitamin D (OSCAL WITH D) 500-200 MG-UNIT per tablet Take 2 by mouth once daily     [provider]  Cholecalciferol (VITAMIN D3) 3000 UNITS TABS Take 1 capsule by mouth daily.    [provider]  cloNIDine (CATAPRES) 0.1 MG tablet Take 0.1 mg by mouth 3 (three) times daily.     [provider]  diazepam (VALIUM) 5 MG tablet Take  5 mg by mouth 2 (two) times daily.     [provider]  diclofenac sodium (VOLTAREN) 1 % GEL Apply topically.    [provider]  ezetimibe-simvastatin (VYTORIN) 10-40 MG per tablet Take 1 tablet by mouth at bedtime.      [provider]  glimepiride (AMARYL) 4 MG tablet Take 4 mg by mouth as needed.     [provider]  insulin glargine (LANTUS) 100 UNIT/ML injection Inject 30 Units into the skin. Every day at 3pm    [provider]  Insulin Glargine (TOUJEO MAX SOLOSTAR) 300 UNIT/ML SOPN Inject 25 Units into the skin.    [provider]  Lancets MISC by Does not apply route daily.      [provider]  LANTUS SOLOSTAR 100 UNIT/ML Solostar Pen  07/20/14   [provider]  linagliptin (TRADJENTA) 5 MG TABS tablet Take 5 mg by mouth daily.    [provider]  Lysine 500 MG TABS Take by mouth. Take 2 daily     [provider]  metoprolol (LOPRESSOR) 50 MG tablet Take 1 tablet by mouth 2 (two) times daily. 01/29/13   [provider]  multivitamin-lutein (OCUVITE-LUTEIN) CAPS Take 1 capsule by mouth daily.      [provider]  NONFORMULARY OR COMPOUNDED ITEM Allergy Vaccine 1:10 Given at Home    [provider]  omeprazole (PRILOSEC) 20 MG capsule Take 20 mg by mouth daily.      [provider]  ONE TOUCH ULTRA TEST test strip  06/21/14   [provider]  telmisartan (MICARDIS) 80 MG tablet Take 1 tablet by mouth daily. 07/18/13   [provider]  traMADol (ULTRAM) 50 MG tablet Take 1 tablet (50 mg total) by mouth every 6 (six) hours as needed. 07/08/17   Montine Circle, PA-C    Family History Family History  Problem Relation Age of Onset  . Lung cancer Brother   . Diabetes Mother     Social History Social History   Tobacco Use  . Smoking status: Never Smoker  . Smokeless tobacco: Never Used  Substance Use Topics  . Alcohol use: Never     Frequency: Never  . Drug use: Never     Allergies   Aspirin; Codeine; Ibuprofen; Meperidine hcl; and Augmentin [amoxicillin-pot clavulanate]   Review of Systems Review of Systems  All other systems reviewed and are negative.    Physical Exam Updated Vital Signs BP (!) 191/71   Pulse 64   Temp 98.2 F (36.8 C) (Oral)   Resp 16   Ht '5\' 4"'  (1.626 m)   Wt 90.7 kg (200 lb)   SpO2 97%   BMI  34.33 kg/m   Physical Exam  Constitutional: She is oriented to person, place, and time. She appears well-developed.  Elderly, obese  HENT:  Head: Normocephalic and atraumatic.  Eyes: Pupils are equal, round, and reactive to light. Conjunctivae and EOM are normal.  Neck: Normal range of motion and phonation normal. Neck supple.  Cardiovascular: Normal rate and regular rhythm.  Pulmonary/Chest: Effort normal and breath sounds normal. She exhibits no tenderness.  Abdominal: Soft. She exhibits no distension. There is no tenderness. There is no guarding.  Musculoskeletal: Normal range of motion. She exhibits edema (2+ lower extremity edema bilaterally.).  Tender and swollen right ankle without deformity.  Mild pain with movement of right hip, both actively and passively.  No right leg shortening.  Normal range of motion arms.  Neurological: She is alert and oriented to person, place, and time. She exhibits normal muscle tone.  No dysarthria, aphasia or nystagmus.  Skin: Skin is warm and dry.  Psychiatric: She has a normal mood and affect. Her behavior is normal. Judgment and thought content normal.  Nursing note and vitals reviewed.    ED Treatments / Results  Labs (all labs ordered are listed, but only abnormal results are displayed) Labs Reviewed  URINALYSIS, ROUTINE W REFLEX MICROSCOPIC - Abnormal; Notable for the following components:      Result Value   Color, Urine STRAW (*)    Glucose, UA 50 (*)    All other components within normal limits  COMPREHENSIVE METABOLIC PANEL -  Abnormal; Notable for the following components:   Glucose, Bld 225 (*)    BUN 50 (*)    Creatinine, Ser 2.15 (*)    Total Protein 6.4 (*)    GFR calc non Af Amer 20 (*)    GFR calc Af Amer 23 (*)    All other components within normal limits  CBC WITH DIFFERENTIAL/PLATELET - Abnormal; Notable for the following components:   Hemoglobin 11.5 (*)    Neutro Abs 8.0 (*)    All other components within normal limits  CBG MONITORING, ED - Abnormal; Notable for the following components:   Glucose-Capillary 190 (*)    All other components within normal limits  I-STAT CG4 LACTIC ACID, ED    EKG EKG Interpretation  Date/Time:  Sunday July 07 2017 19:53:39 EDT Ventricular Rate:  63 PR Interval:    QRS Duration: 109 QT Interval:  413 QTC Calculation: 423 R Axis:   10 Text Interpretation:  Sinus rhythm Abnormal R-wave progression, early transition since last tracing no significant change Confirmed by Daleen Bo [phone]) on 07/07/2017 8:28:18 PM   Radiology Dg Chest 1 View  Result Date: 07/07/2017 CLINICAL DATA:  Patient fell today.  Weakness. EXAM: CHEST  1 VIEW COMPARISON:  07/25/2009 FINDINGS: Cardiomegaly with moderate aortic atherosclerosis. No aortic aneurysm. Both lungs are clear. The visualized skeletal structures are unremarkable. IMPRESSION: No active disease.  Stable cardiomegaly with aortic atherosclerosis. Electronically Signed   By: Ashley Royalty M.D.   On: 07/07/2017 21:21   Dg Pelvis 1-2 Views  Result Date: 07/07/2017 CLINICAL DATA:  Pain after fall EXAM: PELVIS - 1-2 VIEW COMPARISON:  07/25/2009 FINDINGS: Redemonstration of lumbar spinal fusion hardware. Intact pelvis without diastasis. Osteoarthritis of the SI joints with spurring. Intact hip joints with joint space narrowing bilaterally. No acute fracture or joint dislocation. No soft tissue mass. Pelvic phleboliths are redemonstrated. IMPRESSION: No acute osseous abnormality of the bony pelvis and included hips. Degenerative  joint space narrowing of both hips without  fracture or joint dislocation. The included lumbar spinal fusion hardware appears intact. Electronically Signed   By: Ashley Royalty M.D.   On: 07/07/2017 21:21   Dg Ankle 2 Views Right  Result Date: 07/07/2017 CLINICAL DATA:  Patient fell today with right ankle pain. EXAM: RIGHT ANKLE - 2 VIEW COMPARISON:  None. FINDINGS: Well corticated ossific densities off the medial malleolus and lateral aspect of the talus are identified likely representing stigmata of old remote trauma and avulsion injuries. Curvilinear ossific density off the dorsum of the midfoot adjacent to the navicular bone is suspicious for a small capsular avulsion, age indeterminate but more likely acute given overlying soft tissue swelling and lack of well corticated margins. Calcaneal enthesopathy is seen along the plantar aspect. Osteoarthritis of the subtalar and midfoot articulations. Intact base of fifth metatarsal. Slightly osteopenic appearance of the distal diaphysis of the fibula without acute fracture noted. Oblique views may help identify a subtle fracture in this location if clinically warranted. IMPRESSION: Heterogeneous, osteopenic appearance of the distal fibular diaphysis without definite fracture. An oblique view is not provided on this study which may help reveal a fracture, occult on the AP and lateral views. If there is no point tenderness over this region, the findings may simply reflect focal osteopenia. Probable tiny capsular avulsion off the dorsum of the navicular bone. Osteoarthritis of the subtalar and midfoot. Plantar calcaneal enthesopathy. Electronically Signed   By: Ashley Royalty M.D.   On: 07/07/2017 21:18   Dg Ankle Complete Right  Result Date: 07/08/2017 CLINICAL DATA:  Fall. Oblique view for correlation with previous AP and lateral views. EXAM: RIGHT ANKLE - COMPLETE 3+ VIEW COMPARISON:  07/07/2017 FINDINGS: Oblique view demonstrates no evidence of fibular fracture.  Ununited ossicle again demonstrated inferior to the medial malleolus. Soft tissue swelling. IMPRESSION: No evidence of fibular fracture on oblique view. Ununited ossicle again demonstrated inferior to the medial malleolus. Electronically Signed   By: Lucienne Capers M.D.   On: 07/08/2017 00:17    Procedures Procedures (including critical care time)  Medications Ordered in ED Medications  sodium chloride 0.9 % bolus 2,000 mL (0 mLs Intravenous Stopped 07/07/17 2358)  acetaminophen (TYLENOL) tablet 1,000 mg (1,000 mg Oral Given 07/08/17 0148)     Initial Impression / Assessment and Plan / ED Course  I have reviewed the triage vital signs and the nursing notes.  Pertinent labs & imaging results that were available during my care of the patient were reviewed by me and considered in my medical decision making (see chart for details).      Patient Vitals for the past 24 hrs:  BP Temp Temp src Pulse Resp SpO2 Height Weight  07/08/17 0130 (!) 191/71 - - 64 16 97 % - -  07/08/17 0030 (!) 199/66 - - (!) 56 16 97 % - -  07/08/17 0015 - - - (!) 59 17 97 % - -  07/08/17 0000 (!) 201/70 - - (!) 58 12 97 % - -  07/07/17 2330 (!) 192/63 - - 64 18 96 % - -  07/07/17 2300 (!) 194/61 - - 65 17 96 % - -  07/07/17 2245 - - - 65 18 97 % - -  07/07/17 2230 (!) 192/64 - - (!) 59 16 96 % - -  07/07/17 2215 - - - 63 13 97 % - -  07/07/17 2200 (!) 194/65 - - 62 16 96 % - -  07/07/17 2130 (!) 191/81 - - 65 16 95 % - -  07/07/17 2115 - - - 61 15 97 % - -  07/07/17 2100 (!) 198/68 - - 64 19 97 % - -  07/07/17 2045 - - - 62 13 97 % - -  07/07/17 2030 (!) 196/79 - - 65 17 98 % - -  07/07/17 2015 - - - 62 14 97 % - -  07/07/17 2000 (!) 207/67 - - 64 18 97 % - -  07/07/17 1953 - 98.2 F (36.8 C) Oral - - - - -  07/07/17 1945 (!) 162/85 - Oral 67 12 97 % - -  07/07/17 1930 - - - - - - '5\' 4"'  (1.626 m) 90.7 kg (200 lb)  07/07/17 1917 - - - - - 97 % - -    Medical Decision Making-fall possible syncope.   Right pelvic and right ankle injuries.  Screening evaluation for causes of syncope, initiated.  Also imaging obtained for skeletal injuries.  Initial evaluation is reassuring.  Final disposition per oncoming provider team to evaluate after return of labs.  Doubt cardiogenic syncope.  Doubt serious bacterial injury or metabolic instability.  Nursing Notes Reviewed/ Care Coordinated Applicable Imaging Reviewed Interpretation of Laboratory Data incorporated into ED treatment   Plan:  final disposition per oncoming provider team    Final Clinical Impressions(s) / ED Diagnoses   Final diagnoses:  Fall  Closed fracture of right foot, initial encounter    ED Discharge Orders        Ordered    traMADol (ULTRAM) 50 MG tablet  Every 6 hours PRN     07/08/17 0120       Daleen Bo, MD 07/08/17 1357

## 2017-07-07 NOTE — ED Notes (Signed)
Pt back from X-ray.  

## 2017-07-08 MED ORDER — ACETAMINOPHEN 500 MG PO TABS
1000.0000 mg | ORAL_TABLET | Freq: Once | ORAL | Status: AC
  Administered 2017-07-08: 1000 mg via ORAL
  Filled 2017-07-08: qty 2

## 2017-07-08 MED ORDER — TRAMADOL HCL 50 MG PO TABS: 50.0000 mg | ORAL_TABLET | Freq: Four times a day (QID) | ORAL | 0 refills | Status: DC | PRN

## 2017-07-08 NOTE — Discharge Instructions (Addendum)
Drink more water.  Walk with the boot and a walker.   Ice your foot.  Follow-up with your doctor this week.   Return if your symptoms.

## 2017-07-08 NOTE — ED Provider Notes (Signed)
Patient ambulates in camwalker and using walker.  Will have patient seen by home health, and social work at home.  Patient would like to be discharged so that she can go home to her husband, who she helps care for.    Labs and workup are fairly reassuring.  Encouraged her to drink more water this week.  Follow-up with PCP in a week.    No distal fibula fracture seen after obtaining oblique view.    DC to home.  Daughter and patient understand and agree with the plan.     Montine Circle, PA-C 07/08/17 0123    Daleen Bo, MD 07/08/17 1340

## 2017-07-15 ENCOUNTER — Other Ambulatory Visit (HOSPITAL_COMMUNITY): Payer: Self-pay | Admitting: Internal Medicine

## 2017-07-15 DIAGNOSIS — R55 Syncope and collapse: Secondary | ICD-10-CM

## 2017-07-16 ENCOUNTER — Ambulatory Visit (HOSPITAL_COMMUNITY)
Admission: RE | Admit: 2017-07-16 | Discharge: 2017-07-16 | Disposition: A | Discharge status: Home / Self Care | Payer: Medicare Other | Source: Ambulatory Visit | Attending: Vascular Surgery | Admitting: Vascular Surgery

## 2017-07-16 DIAGNOSIS — R55 Syncope and collapse: Secondary | ICD-10-CM | POA: Insufficient documentation

## 2017-07-16 DIAGNOSIS — E119 Type 2 diabetes mellitus without complications: Secondary | ICD-10-CM | POA: Insufficient documentation

## 2017-07-16 DIAGNOSIS — Z794 Long term (current) use of insulin: Secondary | ICD-10-CM | POA: Insufficient documentation

## 2017-07-16 DIAGNOSIS — I6523 Occlusion and stenosis of bilateral carotid arteries: Secondary | ICD-10-CM | POA: Diagnosis not present

## 2017-07-16 DIAGNOSIS — I10 Essential (primary) hypertension: Secondary | ICD-10-CM | POA: Insufficient documentation

## 2017-08-02 ENCOUNTER — Ambulatory Visit: Payer: Medicare Other | Admitting: Internal Medicine

## 2017-08-02 ENCOUNTER — Encounter: Payer: Self-pay | Admitting: Internal Medicine

## 2017-08-02 DIAGNOSIS — G4733 Obstructive sleep apnea (adult) (pediatric): Secondary | ICD-10-CM | POA: Diagnosis not present

## 2017-08-02 DIAGNOSIS — J449 Chronic obstructive pulmonary disease, unspecified: Secondary | ICD-10-CM

## 2017-08-02 MED ORDER — ALBUTEROL SULFATE HFA 108 (90 BASE) MCG/ACT IN AERS: INHALATION_SPRAY | RESPIRATORY_TRACT | 99 refills | Status: DC

## 2017-08-02 NOTE — Progress Notes (Signed)
Patient ID: [name] [name], female    DOB: [birth year], 84 y.o.   MRN: [id number]  HPI female never smoker followed for asthma/bronchitis, allergic rhinitis, OSA/CPAP, complicated by history DVT, history lipomas, DM, GERD NPSG 01/15/11- confirmed mild OSA 7.6/hr --------------------------------------------------------------------------------- 08/02/16-84 year old female never smoker followed for asthma/bronchitis, allergic rhinitis, OSA/CPAP, complicated by history DVT, lipomas, DM, GERD CPAP auto 5-15/Advanced FOLLOW UP FOR patient is here for her 1 year follow up DME is  [facility]  patient is sleeping about 9 hours a night with her CPAP  Download 98% four hour compliance, AHI 0.9/hour. Complains of feeling "worn out". Husband had stroke and she is having to care for him. Increased use of rescue inhaler but not needing nebulizer machine recently. No acute events or infections.  08/02/2017- 84 year old female never smoker followed for asthma/bronchitis, allergic rhinitis, OSA/CPAP, complicated by history DVT, lipomas, DM, GERD CPAP auto 5-15/Advanced Download 90% compliance AHI 1.6/hour.  She sleeps better with CPAP and does not want to sleep without it.  Has been having to get up at night to help her husband. ----Has fallen twice since last ov. Cpap is going well. Husband has recently had another stroke so she is having to get up more to take care of him.  Daughter [name] [name] is here with her.  Mother has been doing well with no significant respiratory problems, using rescue inhaler once every 3 or 4 months and no need for nebulizer machine this winter. Still sleeps better with CPAP.  Compliance affected by need to get up to care for her husband at night, but doing well with good control.  ROS-See HPI    + = positive Constitutional:   +   weight loss, night sweats, fevers, chills, fatigue, lassitude. HEENT:   +  headaches, no-difficulty swallowing, tooth/dental problems, sore throat,       Little sneezing,  itching, ear ache, +nasal congestion, post nasal drip,  CV:  No-   chest pain, orthopnea, PND, swelling in lower extremities, anasarca, dizziness, palpitations Resp: + shortness of breath with exertion or at rest.              No-   productive cough,  No non-productive cough,  No- coughing up of blood.              No-   change in color of mucus.  No- wheezing.   Skin: No-   rash or lesions. GI:  No-   heartburn, indigestion, abdominal pain, nausea, vomiting,  GU:  MS:  No-   joint pain or swelling.   Neuro-    + tremor Psych:  No- change in mood or affect. No depression or anxiety.  No memory loss.     Objective:   Physical Exam General- Alert, Oriented, Affect-appropriate, Distress- none acute,  +obese Skin- clear Lymphadenopathy- none Head- atraumatic            Eyes- Gross vision intact, PERRLA, conjunctivae clear secretions            Ears- Hearing, canals-normal            Nose- minimal turbinate edema, no-Septal dev, mucus, polyps, erosion, perforation             Throat- Mallampati III , mucosa clear , drainage- none, tonsils- atrophic;                         +mild chronic hoarseness-unchanged. Neck- flexible , trachea midline, no stridor , thyroid  nl, carotid no bruit Chest - symmetrical excursion , unlabored           Heart/CV- RRR , no murmur , no gallop  , no rub, nl s1 s2                           - JVD- none , edema- none, stasis changes- none, varices- none           Lung- clear to P&A, wheeze- none, cough- none , dullness-none, rub- none           Chest wall-  Abd- Br/ Gen/ Rectal- Not done, not indicated Extrem- cyanosis- none, clubbing, none, atrophy- none, strength- nl. + varices at ankles, + elastic hose Neuro- +resting tremor

## 2017-08-02 NOTE — Assessment & Plan Note (Signed)
Good compliance and control.  She feels better with CPAP and continues to benefit. Plan-continue auto 5-15

## 2017-08-02 NOTE — Patient Instructions (Signed)
Refill sent for Proair  We can continue CPAP auto 5-15, mask of choice, humidifier, supplies, AirView  Please call if we can help

## 2017-08-03 NOTE — Assessment & Plan Note (Signed)
[name] [name] has been good without major exacerbation this winter.  Only occasional use of rescue inhaler and not needing a maintenance controller currently.  We discussed expectations through the summer months.

## 2017-08-28 ENCOUNTER — Ambulatory Visit (INDEPENDENT_AMBULATORY_CARE_PROVIDER_SITE_OTHER): Payer: Medicare Other | Admitting: Cardiovascular Disease

## 2017-08-28 ENCOUNTER — Encounter: Payer: Self-pay | Admitting: Cardiovascular Disease

## 2017-08-28 VITALS — BP 122/54 | HR 64 | Ht 64.0 in | Wt 186.0 lb

## 2017-08-28 DIAGNOSIS — R55 Syncope and collapse: Secondary | ICD-10-CM | POA: Diagnosis not present

## 2017-08-28 DIAGNOSIS — Z87898 Personal history of other specified conditions: Secondary | ICD-10-CM | POA: Insufficient documentation

## 2017-08-28 DIAGNOSIS — E785 Hyperlipidemia, unspecified: Secondary | ICD-10-CM | POA: Insufficient documentation

## 2017-08-28 DIAGNOSIS — E7800 Pure hypercholesterolemia, unspecified: Secondary | ICD-10-CM | POA: Diagnosis not present

## 2017-08-28 NOTE — Progress Notes (Signed)
08/28/2017 [name]   16-May-1929  [id number]  Primary Physician Marton Redwood, MD Primary Cardiologist: Lorretta Harp MD Lupe Carney, [place]  HPI:  CARLITA WHITCOMB is a 84 y.o. moderately overweight married Caucasian female mother of 68, grandmother of 4 grandchildren is accompanied by her daughter [name] who is a retired Statistician.  She was referred by Dr. Lang Snow for evaluation of syncope.  Her cardiac risk factors include treated hypertension, diabetes and hyperlipidemia.  Her mother did die of a second myocardial infarction.  She is never had a heart attack or stroke.  She denies chest pain or shortness of breath.  She does take care of her husband has had 2 strokes in the past.  She had 2 episodes of syncope, one in mid April and one on April 28 which brought her to the emergency room.  Her work-up was unremarkable.  Subsequent carotid Doppler showed no evidence of ICA stenosis.   Current Meds  Medication Sig  . albuterol (PROAIR HFA) 108 (90 Base) MCG/ACT inhaler 2 puffs every 4-6 hours as directed- rescue  . albuterol (PROVENTIL) (2.5 MG/3ML) 0.083% nebulizer solution Take 3 mLs (2.5 mg total) by nebulization every 6 (six) hours as needed for wheezing or shortness of breath. DX J45.998  . Blood Glucose Monitoring Suppl (ONE TOUCH ULTRA SYSTEM KIT) W/DEVICE KIT 1 kit by Does not apply route once.    . calcium carbonate (OS-CAL - DOSED IN MG OF ELEMENTAL CALCIUM) 1250 (500 Ca) MG tablet Take 1 tablet by mouth 2 (two) times daily with a meal.  . calcium-vitamin D (OSCAL WITH D) 500-200 MG-UNIT per tablet Take 2 by mouth once daily   . Cholecalciferol (VITAMIN D3) 3000 UNITS TABS Take 1 capsule by mouth daily.  . cloNIDine (CATAPRES) 0.1 MG tablet Take 0.1 mg by mouth 3 (three) times daily.   . diazepam (VALIUM) 5 MG tablet Take 5 mg by mouth 2 (two) times daily.   . diclofenac sodium (VOLTAREN) 1 % GEL Apply topically.  Marland Kitchen ezetimibe-simvastatin (VYTORIN) 10-40  MG per tablet Take 1 tablet by mouth at bedtime.    . furosemide (LASIX) 40 MG tablet Take 40 mg by mouth daily.  . hydrALAZINE (APRESOLINE) 50 MG tablet Take 50 mg by mouth 4 (four) times daily.   . Insulin Glargine (TOUJEO MAX SOLOSTAR) 300 UNIT/ML SOPN Inject 25 Units into the skin.  . Lancets MISC by Does not apply route daily.    Marland Kitchen linagliptin (TRADJENTA) 5 MG TABS tablet Take 5 mg by mouth daily.  Marland Kitchen Lysine 500 MG TABS Take by mouth. Take 2 daily   . metoprolol (LOPRESSOR) 50 MG tablet Take 1 tablet by mouth 2 (two) times daily.  . Multiple Vitamins-Minerals (PRESERVISION AREDS 2 PO) Take 1 tablet by mouth 2 (two) times daily.  . multivitamin-lutein (OCUVITE-LUTEIN) CAPS Take 1 capsule by mouth daily.    Marland Kitchen omeprazole (PRILOSEC) 20 MG capsule Take 20 mg by mouth daily.    . ONE TOUCH ULTRA TEST test strip   . telmisartan (MICARDIS) 80 MG tablet Take 1 tablet by mouth daily.  . traMADol (ULTRAM) 50 MG tablet Take 1 tablet (50 mg total) by mouth every 6 (six) hours as needed.  . vitamin B-12 (CYANOCOBALAMIN) 1000 MCG tablet Take 1,000 mcg by mouth daily.    Marland Kitchen warfarin (COUMADIN) 3 MG tablet Take 6-9 mg by mouth daily. Monday, Wednesday and Friday take 9 mg all other days take 6 mg.  Allergies  Allergen Reactions  . Aspirin Nausea And Vomiting  . Codeine Nausea And Vomiting  . Ibuprofen Nausea And Vomiting  . Meperidine Hcl   . Augmentin [Amoxicillin-Pot Clavulanate]     GI upset    Social History   Socioeconomic History  . Marital status: Married    Spouse name: Not on file  . Number of children: Not on file  . Years of education: Not on file  . Highest education level: Not on file  Occupational History  . Not on file  Social Needs  . Financial resource strain: Not on file  . Food insecurity:    Worry: Not on file    Inability: Not on file  . Transportation needs:    Medical: Not on file    Non-medical: Not on file  Tobacco Use  . Smoking status: Never Smoker  .  Smokeless tobacco: Never Used  Substance and Sexual Activity  . Alcohol use: Never    Frequency: Never  . Drug use: Never  . Sexual activity: Not Currently  Lifestyle  . Physical activity:    Days per week: Not on file    Minutes per session: Not on file  . Stress: Not on file  Relationships  . Social connections:    Talks on phone: Not on file    Gets together: Not on file    Attends religious service: Not on file    Active member of club or organization: Not on file    Attends meetings of clubs or organizations: Not on file    Relationship status: Not on file  . Intimate partner violence:    Fear of current or ex partner: Not on file    Emotionally abused: Not on file    Physically abused: Not on file    Forced sexual activity: Not on file  Other Topics Concern  . Not on file  Social History Narrative  . Not on file     Review of Systems: General: negative for chills, fever, night sweats or weight changes.  Cardiovascular: negative for chest pain, dyspnea on exertion, edema, orthopnea, palpitations, paroxysmal nocturnal dyspnea or shortness of breath Dermatological: negative for rash Respiratory: negative for cough or wheezing Urologic: negative for hematuria Abdominal: negative for nausea, vomiting, diarrhea, bright red blood per rectum, melena, or hematemesis Neurologic: negative for visual changes, syncope, or dizziness All other systems reviewed and are otherwise negative except as noted above.    Blood pressure (!) 122/54, pulse 64, height '5\' 4"'  (1.626 m), weight 186 lb (84.4 kg).  General appearance: alert and no distress Neck: no adenopathy, no carotid bruit, no JVD, supple, symmetrical, trachea midline and thyroid not enlarged, symmetric, no tenderness/mass/nodules Lungs: clear to auscultation bilaterally Heart: regular rate and rhythm, S1, S2 normal, no murmur, click, rub or gallop Extremities: extremities normal, atraumatic, no cyanosis or edema Pulses: 2+  and symmetric Skin: Skin color, texture, turgor normal. No rashes or lesions Neurologic: Alert and oriented X 3, normal strength and tone. Normal symmetric reflexes. Normal coordination and gait  EKG not performed today  ASSESSMENT AND PLAN:   HYPERTENSION History of essential hypertension with blood pressure measured today at 122/54.  She is on hydralazine, Micardis and metoprolol.  Continue current meds at current dosing.  Hyperlipidemia History of hyperlipidemia on statin therapy and Zetia.  Syncope Ms. najera was referred to me by Dr. Brigitte Pulse for evaluation of syncope.  She had 2 episodes both in April.  There were no feeding symptoms.  She had no prior or subsequent episodes either.  She did not feel hypoglycemic.  There is no history of valvular heart disease or arrhythmias.  I am going to get a 2D echo and a 30-day event monitor to further evaluate.      Lorretta Harp MD FACP,FACC,FAHA, [facility] 08/28/2017 9:26 AM

## 2017-08-28 NOTE — Assessment & Plan Note (Signed)
[name] [name] was referred to me by Dr. Brigitte Pulse for evaluation of syncope.  She had 2 episodes both in April.  There were no feeding symptoms.  She had no prior or subsequent episodes either.  She did not feel hypoglycemic.  There is no history of valvular heart disease or arrhythmias.  I am going to get a 2D echo and a 30-day event monitor to further evaluate.

## 2017-08-28 NOTE — Assessment & Plan Note (Signed)
History of hyperlipidemia on statin therapy and Zetia.

## 2017-08-28 NOTE — Patient Instructions (Signed)
Medication Instructions: Your physician recommends that you continue on your current medications as directed. Please refer to the Current Medication list given to you today.  Testing/Procedures: Your physician has requested that you have an echocardiogram. Echocardiography is a painless test that uses sound waves to create images of your heart. It provides your doctor with information about the size and shape of your heart and how well your heart's chambers and valves are working. This procedure takes approximately one hour. There are no restrictions for this procedure.  Your physician has recommended that you wear a 30 day event monitor. Event monitors are medical devices that record the heart's electrical activity. Doctors most often [place] these monitors to diagnose arrhythmias. Arrhythmias are problems with the speed or rhythm of the heartbeat. The monitor is a small, portable device. You can wear one while you do your normal daily activities. This is usually used to diagnose what is causing palpitations/syncope (passing out).  Follow-Up: Your physician wants you to follow-up in: 6 months with Dr. Gwenlyn Found. You will receive a reminder letter in the mail two months in advance. If you don't receive a letter, please call our office to schedule the follow-up appointment.  If you need a refill on your cardiac medications before your next appointment, please call your pharmacy.

## 2017-08-28 NOTE — Assessment & Plan Note (Signed)
History of essential hypertension with blood pressure measured today at 122/54.  She is on hydralazine, Micardis and metoprolol.  Continue current meds at current dosing.

## 2017-09-10 ENCOUNTER — Ambulatory Visit (HOSPITAL_COMMUNITY): Payer: Medicare Other | Attending: Cardiovascular Disease

## 2017-09-10 ENCOUNTER — Ambulatory Visit (INDEPENDENT_AMBULATORY_CARE_PROVIDER_SITE_OTHER): Payer: Medicare Other

## 2017-09-10 ENCOUNTER — Other Ambulatory Visit: Payer: Self-pay

## 2017-09-10 DIAGNOSIS — R55 Syncope and collapse: Secondary | ICD-10-CM

## 2017-09-10 DIAGNOSIS — I059 Rheumatic mitral valve disease, unspecified: Secondary | ICD-10-CM | POA: Insufficient documentation

## 2017-09-10 DIAGNOSIS — E119 Type 2 diabetes mellitus without complications: Secondary | ICD-10-CM | POA: Insufficient documentation

## 2017-09-10 DIAGNOSIS — I119 Hypertensive heart disease without heart failure: Secondary | ICD-10-CM | POA: Diagnosis not present

## 2017-10-28 ENCOUNTER — Ambulatory Visit: Payer: Medicare Other | Admitting: Cardiovascular Disease

## 2018-04-03 ENCOUNTER — Ambulatory Visit: Payer: Medicare Other | Admitting: Podiatry

## 2018-04-03 ENCOUNTER — Encounter: Payer: Self-pay | Admitting: Podiatry

## 2018-04-03 VITALS — BP 143/67 | HR 56 | Resp 16

## 2018-04-03 DIAGNOSIS — L600 Ingrowing nail: Secondary | ICD-10-CM

## 2018-04-03 MED ORDER — NEOMYCIN-POLYMYXIN-HC 1 % OT SOLN: OTIC | 1 refills | Status: DC

## 2018-04-03 NOTE — Patient Instructions (Signed)

## 2018-04-03 NOTE — Progress Notes (Signed)
Subjective:  Patient ID: [name] [name], female    DOB: October 25, 1929,  MRN: [id number] HPI Chief Complaint  Patient presents with  . Toe Pain    Hallux right - lateral border, tender x few weeks, swollen, red and draining x 1 week, soaking in epsom, using ointment with pain relief and bandaging  . New Patient (Initial Visit)    85 y.o. female presents with the above complaint.   ROS: Denies fever chills nausea vomiting muscle aches pains calf pain back pain chest pain shortness of breath.  Past Medical History:  Diagnosis Date  . Allergic rhinitis, cause unspecified   . Asthma   . Esophageal reflux   . Type II or unspecified type diabetes mellitus without mention of complication, not stated as uncontrolled   . Unspecified essential hypertension    Past Surgical History:  Procedure Laterality Date  . APPENDECTOMY    . FIXATION KYPHOPLASTY LUMBAR SPINE    . HIATAL HERNIA REPAIR    . LUMBAR SPINE SURGERY    . TUBAL LIGATION    . VAGINAL HYSTERECTOMY      Current Outpatient Medications:  .  albuterol (PROAIR HFA) 108 (90 Base) MCG/ACT inhaler, 2 puffs every 4-6 hours as directed- rescue, Disp: 1 Inhaler, Rfl: prn .  albuterol (PROVENTIL) (2.5 MG/3ML) 0.083% nebulizer solution, Take 3 mLs (2.5 mg total) by nebulization every 6 (six) hours as needed for wheezing or shortness of breath. DX J45.998, Disp: 60 vial, Rfl: 12 .  BD PEN NEEDLE NANO U/F 32G X 4 MM MISC, USE TO INJECT INSULIN ONCE D, Disp: , Rfl:  .  Blood Glucose Monitoring Suppl (ONE TOUCH ULTRA SYSTEM KIT) W/DEVICE KIT, 1 kit by Does not apply route once.  , Disp: , Rfl:  .  calcium carbonate (OS-CAL - DOSED IN MG OF ELEMENTAL CALCIUM) 1250 (500 Ca) MG tablet, Take 1 tablet by mouth 2 (two) times daily with a meal., Disp: , Rfl:  .  calcium-vitamin D (OSCAL WITH D) 500-200 MG-UNIT per tablet, Take 2 by mouth once daily , Disp: , Rfl:  .  Cholecalciferol (VITAMIN D3) 3000 UNITS TABS, Take 1 capsule by mouth daily., Disp: ,  Rfl:  .  cloNIDine (CATAPRES) 0.1 MG tablet, Take 0.1 mg by mouth 3 (three) times daily. , Disp: , Rfl:  .  diazepam (VALIUM) 5 MG tablet, Take 5 mg by mouth 2 (two) times daily. , Disp: , Rfl:  .  diclofenac sodium (VOLTAREN) 1 % GEL, Apply topically., Disp: , Rfl:  .  ezetimibe-simvastatin (VYTORIN) 10-40 MG per tablet, Take 1 tablet by mouth at bedtime.  , Disp: , Rfl:  .  furosemide (LASIX) 40 MG tablet, Take 40 mg by mouth daily., Disp: , Rfl:  .  hydrALAZINE (APRESOLINE) 50 MG tablet, Take 50 mg by mouth 4 (four) times daily. , Disp: , Rfl:  .  Insulin Glargine (TOUJEO Kenyona Rena SOLOSTAR) 300 UNIT/ML SOPN, Inject 25 Units into the skin., Disp: , Rfl:  .  Lancets MISC, by Does not apply route daily.  , Disp: , Rfl:  .  linagliptin (TRADJENTA) 5 MG TABS tablet, Take 5 mg by mouth daily., Disp: , Rfl:  .  Lysine 500 MG TABS, Take by mouth. Take 2 daily , Disp: , Rfl:  .  metoprolol (LOPRESSOR) 50 MG tablet, Take 1 tablet by mouth 2 (two) times daily., Disp: , Rfl:  .  Multiple Vitamins-Minerals (PRESERVISION AREDS 2 PO), Take 1 tablet by mouth 2 (two) times daily.,  Disp: , Rfl:  .  multivitamin-lutein (OCUVITE-LUTEIN) CAPS, Take 1 capsule by mouth daily.  , Disp: , Rfl:  .  mupirocin ointment (BACTROBAN) 2 %, , Disp: , Rfl:  .  NEOMYCIN-POLYMYXIN-HYDROCORTISONE (CORTISPORIN) 1 % SOLN OTIC solution, Apply 1-2 drops to toe BID after soaking, Disp: 10 mL, Rfl: 1 .  omeprazole (PRILOSEC) 20 MG capsule, Take 20 mg by mouth daily.  , Disp: , Rfl:  .  ONE TOUCH ULTRA TEST test strip, , Disp: , Rfl: 8 .  simvastatin (ZOCOR) 40 MG tablet, TK 1 T PO D, Disp: , Rfl:  .  telmisartan (MICARDIS) 80 MG tablet, Take 1 tablet by mouth daily., Disp: , Rfl:  .  traMADol (ULTRAM) 50 MG tablet, Take 1 tablet (50 mg total) by mouth every 6 (six) hours as needed., Disp: 10 tablet, Rfl: 0 .  vitamin B-12 (CYANOCOBALAMIN) 1000 MCG tablet, Take 1,000 mcg by mouth daily.  , Disp: , Rfl:  .  warfarin (COUMADIN) 3 MG tablet,  Take 6-9 mg by mouth daily. Monday, Wednesday and Friday take 9 mg all other days take 6 mg., Disp: , Rfl:   Allergies  Allergen Reactions  . Aspirin Nausea And Vomiting  . Codeine Nausea And Vomiting  . Ibuprofen Nausea And Vomiting  . Meperidine Hcl   . Augmentin [Amoxicillin-Pot Clavulanate]     GI upset   Review of Systems Objective:   Vitals:   04/03/18 0832  BP: (!) 143/67  Pulse: (!) 56  Resp: 16    General: Well developed, nourished, in no acute distress, alert and oriented x3   Dermatological: Skin is warm, dry and supple bilateral. Nails x 10 are well maintained; remaining integument appears unremarkable at this time. There are no open sores, no preulcerative lesions, no rash or signs of infection present.  Sharp incurvated nail margin with erythema and edema along the fibular border of the hallux right.  Vascular: Dorsalis Pedis artery and Posterior Tibial artery pedal pulses are 2/4 bilateral with immedate capillary fill time. Pedal hair growth present. No varicosities and no lower extremity edema present bilateral.   Neruologic: Grossly intact via light touch bilateral. Vibratory intact via tuning fork bilateral. Protective threshold with Semmes Wienstein monofilament intact to all pedal sites bilateral. Patellar and Achilles deep tendon reflexes 2+ bilateral. No Babinski or clonus noted bilateral.   Musculoskeletal: No gross boney pedal deformities bilateral. No pain, crepitus, or limitation noted with foot and ankle range of motion bilateral. Muscular strength 5/5 in all groups tested bilateral.  Gait: Unassisted, Nonantalgic.    Radiographs:  None taken  Assessment & Plan:   Assessment: Ingrown nail fibular border hallux right.  Plan: Chemical matricectomy was performed today after local anesthetic consisting of 3 cc of a 50-50 mixture of Marcaine plain lidocaine plain was infiltrated in a hallux block.  She tolerated procedure well without complications.   She was provided with both oral and written home-going structures for the care and soaking of the toe as well as a prescription for Cortisporin Otic to be applied twice daily after soaking.  I will follow-up with her in 2 weeks for follow-up.  She will call with questions or concerns.     Ambyr Qadri T. [place], [place]

## 2018-04-17 ENCOUNTER — Ambulatory Visit (INDEPENDENT_AMBULATORY_CARE_PROVIDER_SITE_OTHER): Payer: Medicare Other | Admitting: Podiatry

## 2018-04-17 DIAGNOSIS — L600 Ingrowing nail: Secondary | ICD-10-CM

## 2018-04-17 MED ORDER — DOXYCYCLINE HYCLATE 100 MG PO TABS: 100.0000 mg | ORAL_TABLET | Freq: Two times a day (BID) | ORAL | 0 refills | Status: DC

## 2018-04-17 NOTE — Patient Instructions (Signed)

## 2018-04-17 NOTE — Progress Notes (Signed)
She presents today states my toe is still painful and draining it was doing great last week but now is really upset and hurting again as she refers to the right hallux fibular border.  Objective: Vital signs are stable alert oriented x3.  There is positive erythema and granulation tissue along the fibular border at the proximal nail fold and lateral nail fold intersection.  It is tender on palpation no purulence no malodor.  Assessment: Ingrown nail paronychia abscess hallux right.  Plan: Discussed etiology pathology and surgical therapies and incision and drainage performed once again today.  She received both oral and written home-going strips for the care and soaking of the toe follow-up with her in a couple weeks she also received a prescription for doxycycline.

## 2018-04-22 ENCOUNTER — Encounter: Payer: Self-pay | Admitting: Podiatry

## 2018-04-22 ENCOUNTER — Ambulatory Visit: Payer: Medicare Other | Admitting: Podiatry

## 2018-04-22 DIAGNOSIS — M79676 Pain in unspecified toe(s): Secondary | ICD-10-CM | POA: Diagnosis not present

## 2018-04-22 DIAGNOSIS — B351 Tinea unguium: Secondary | ICD-10-CM

## 2018-04-22 NOTE — Progress Notes (Signed)
She presents today for follow-up of her matrixectomy.  She states he is doing so much better this time I cannot believe the difference.  Objective: Vital signs are stable alert oriented x3.  Pulses are palpable.  There is mild erythema no edema cellulitis drainage or odor mild tenderness on palpation of the fibular border of the hallux right.  Otherwise her toenails are grossly elongated thickened discolored painful on palpation as well as debridement.  Assessment: Well-healing matrixectomy fibular border hallux right.  Pain in limb secondary to onychomycosis.  Plan: Debridement of toenails bilaterally.  Instructed her to continue to soak the hallux for the next 2 to 3 days and I will follow-up with her in a few weeks.

## 2018-06-09 ENCOUNTER — Telehealth: Payer: Self-pay | Admitting: Internal Medicine

## 2018-06-09 DIAGNOSIS — G4733 Obstructive sleep apnea (adult) (pediatric): Secondary | ICD-10-CM

## 2018-06-09 NOTE — Telephone Encounter (Signed)
Called the patient and confirmed the motor for the CPAP died. Her mask and other supplies are working fine.  Advised the patient that I was able to check and Dr. Annamaria Boots gave approval for the order to be placed.  Order placed. Nothing further needed at this time.

## 2018-06-09 NOTE — Telephone Encounter (Signed)
LMTCB x1 for pt.  

## 2018-06-09 NOTE — Telephone Encounter (Signed)
Pt is returning call. Cb is [phone]

## 2018-06-25 ENCOUNTER — Encounter: Payer: Self-pay | Admitting: Internal Medicine

## 2018-06-27 ENCOUNTER — Ambulatory Visit (INDEPENDENT_AMBULATORY_CARE_PROVIDER_SITE_OTHER): Payer: Medicare Other | Admitting: Internal Medicine

## 2018-06-27 ENCOUNTER — Other Ambulatory Visit: Payer: Self-pay

## 2018-06-27 DIAGNOSIS — G4733 Obstructive sleep apnea (adult) (pediatric): Secondary | ICD-10-CM

## 2018-06-27 DIAGNOSIS — J4530 Mild persistent asthma, uncomplicated: Secondary | ICD-10-CM

## 2018-06-27 MED ORDER — ALBUTEROL SULFATE HFA 108 (90 BASE) MCG/ACT IN AERS: INHALATION_SPRAY | RESPIRATORY_TRACT | 99 refills | Status: DC

## 2018-06-27 MED ORDER — ALBUTEROL SULFATE (2.5 MG/3ML) 0.083% IN NEBU: 2.5000 mg | INHALATION_SOLUTION | Freq: Four times a day (QID) | RESPIRATORY_TRACT | 12 refills | Status: DC | PRN

## 2018-06-27 NOTE — Patient Instructions (Addendum)
Refill scripts e-sent for albuterol inhaler and albuterol neb solution  Order- D[facility]- please change autopap range to 5-12, continue mask of choice, humidifier, supplies, AirView/ card   Please call if we can help

## 2018-06-27 NOTE — Progress Notes (Signed)
Patient ID: [name] [name], female    DOB: May 13, 1929, 85 y.o.   MRN: [id number]  HPI female never smoker followed for asthma/bronchitis, allergic rhinitis, OSA/CPAP, complicated by history DVT, history lipomas, DM, GERD NPSG 01/15/11- confirmed mild OSA 7.6/hr ---------------------------------------------------------------------------------  08/02/2017- 85 year old female never smoker followed for asthma/bronchitis, allergic rhinitis, OSA/CPAP, complicated by history DVT, lipomas, DM, GERD CPAP auto 5-15/Advanced Download 90% compliance AHI 1.6/hour.  She sleeps better with CPAP and does not want to sleep without it.  Has been having to get up at night to help her husband. ----Has fallen twice since last ov. Cpap is going well. Husband has recently had another stroke so she is having to get up more to take care of him.  Daughter [name] is here with her.  Mother has been doing well with no significant respiratory problems, using rescue inhaler once every 3 or 4 months and no need for nebulizer machine this winter. Still sleeps better with CPAP.  Compliance affected by need to get up to care for her husband at night, but doing well with good control.  ROS-See HPI    + = positive Constitutional:   +   weight loss, night sweats, fevers, chills, fatigue, lassitude. HEENT:   +  headaches, no-difficulty swallowing, tooth/dental problems, sore throat,       Little sneezing, itching, ear ache, +nasal congestion, post nasal drip,  CV:  No-   chest pain, orthopnea, PND, swelling in lower extremities, anasarca, dizziness, palpitations Resp: + shortness of breath with exertion or at rest.              No-   productive cough,  No non-productive cough,  No- coughing up of blood.              No-   change in color of mucus.  No- wheezing.   Skin: No-   rash or lesions. GI:  No-   heartburn, indigestion, abdominal pain, nausea, vomiting,  GU:  MS:  No-   joint pain or swelling.   Neuro-    + tremor Psych:   No- change in mood or affect. No depression or anxiety.  No memory loss.     Objective:   Physical Exam General- Alert, Oriented, Affect-appropriate, Distress- none acute,  +obese Skin- clear Lymphadenopathy- none Head- atraumatic            Eyes- Gross vision intact, PERRLA, conjunctivae clear secretions            Ears- Hearing, canals-normal            Nose- minimal turbinate edema, no-Septal dev, mucus, polyps, erosion, perforation             Throat- Mallampati III , mucosa clear , drainage- none, tonsils- atrophic;                         +mild chronic hoarseness-unchanged. Neck- flexible , trachea midline, no stridor , thyroid nl, carotid no bruit Chest - symmetrical excursion , unlabored           Heart/CV- RRR , no murmur , no gallop  , no rub, nl s1 s2                           - JVD- none , edema- none, stasis changes- none, varices- none           Lung- clear to P&A, wheeze- none,  cough- none , dullness-none, rub- none           Chest wall-  Abd- Br/ Gen/ Rectal- Not done, not indicated Extrem- cyanosis- none, clubbing, none, atrophy- none, strength- nl. + varices at ankles, + elastic hose Neuro- +resting tremor   06/27/2018- Virtual Visit via Telephone Note  I connected with [name] [name] on 06/27/18 at  9:30 AM EDT by telephone and verified that I am speaking with the correct person using two identifiers.   I discussed the limitations, risks, security and privacy concerns of performing an evaluation and management service by telephone and the availability of in person appointments. I also discussed with the patient that there may be a patient responsible charge related to this service. The patient expressed understanding and agreed to proceed.   History of Present Illness:  85 year old female never smoker followed for asthma/bronchitis, allergic rhinitis, OSA/CPAP, complicated by history DVT, lipomas, DM, GERD CPAP auto 5-15/Advanced>> today change to 5-12 Download  compliance 43%- reflecting start of recording with this machine April 3, but good since then. AHI 3.8. -----osa follow up, having problems sleeping on side with cpap blowing air into her mouth  She got a new CPAP machine in April, with no change in mask style and same settings. This machine seems to be blowing harder, blowing air out mouth if she is on L side. Asthma well-controlled. Denies acute illness/ Covid exposures. She is largely house-confined, carrying for debilitated family. She would like bronchodilators refilled, but using neb and rescue infrequently.    Observations/Objective: NPSG 01/15/11- confirmed mild OSA 7.6/hr Download 43% compliance3/17-4/15/2020 but restarted regular use on 4/3, AHI 3.8/ hr  Assessment and Plan: OSA- benefits. New machine blowing harder. Will reduce pressure range.  Follow Up Instructions:    I discussed the assessment and treatment plan with the patient. The patient was provided an opportunity to ask questions and all were answered. The patient agreed with the plan and demonstrated an understanding of the instructions.   The patient was advised to call back or seek an in-person evaluation if the symptoms worsen or if the condition fails to improve as anticipated.  I provided 21 minutes of non-face-to-face time during this encounter.   Baird Lyons, MD

## 2018-06-30 ENCOUNTER — Encounter: Payer: Self-pay | Admitting: Internal Medicine

## 2018-06-30 NOTE — Assessment & Plan Note (Signed)
She continues to benefit from CPAP. Compliance and control good since started new machine/ card. Plan- reduce pressure range to auto 5-12 for comfort.

## 2018-06-30 NOTE — Assessment & Plan Note (Signed)
Well controlled with infrequent need for bronchodilators. Plan- refill neb solution and ProAir

## 2018-08-05 ENCOUNTER — Ambulatory Visit: Payer: Medicare Other | Admitting: Internal Medicine

## 2018-11-12 ENCOUNTER — Telehealth: Payer: Self-pay | Admitting: Internal Medicine

## 2018-11-14 NOTE — Telephone Encounter (Signed)
Spoke with pt. States that [facility] is needing something from [place] in order to get payment from her insurance company for her CPAP. What the pt explained to me sounds like a download. Called [name] with [employer] to see exactly what's needed. Lenna Sciara is going to look in to this and call us back.

## 2018-11-18 ENCOUNTER — Encounter: Payer: Self-pay | Admitting: Internal Medicine

## 2018-11-18 NOTE — Telephone Encounter (Signed)
Stenson, [name]  Leara Rawl, [place], [place]; Darlina Guys; Herbert Moors, Samuel Germany        It looks like she was set up with a new PAP in April so she needs her f/u appt. Can you help?   Thank you!  [name]   Previous Messages  ----- Message -----  From: Valerie Salts, CMA  Sent: 11/18/2018  9:09 AM EDT  To: Darlina Guys  Subject: Insurance Question                Hello [name]!   Hope you are doing well!   I was checking to see if you had any updates for this patient in regards to her insurance and cpap machine. She called our office late last week saying that [employer] needed something from [place] to prove she is using the CPAP machine so that her insurance can issue payment.    Thanks!      Spoke with patient and explained to her what was needed. She has been scheduled for a televisit tomorrow at 930a with CY. I explained to her the process of a televisit, she verbalized understanding.   Nothing further needed at time of call.

## 2018-11-18 NOTE — Telephone Encounter (Signed)
Staff message sent to [employer] to follow up on this.

## 2018-11-19 ENCOUNTER — Ambulatory Visit (INDEPENDENT_AMBULATORY_CARE_PROVIDER_SITE_OTHER): Payer: Medicare Other | Admitting: Internal Medicine

## 2018-11-19 ENCOUNTER — Encounter: Payer: Self-pay | Admitting: Internal Medicine

## 2018-11-19 ENCOUNTER — Other Ambulatory Visit: Payer: Self-pay

## 2018-11-19 DIAGNOSIS — J4530 Mild persistent asthma, uncomplicated: Secondary | ICD-10-CM | POA: Diagnosis not present

## 2018-11-19 DIAGNOSIS — G4733 Obstructive sleep apnea (adult) (pediatric): Secondary | ICD-10-CM | POA: Diagnosis not present

## 2018-11-19 NOTE — Patient Instructions (Signed)
Order- DME [employer]- Please service CPAP machine- patient reports it is cutting off. Continue auto 5-12, mask of choice, humidifier, supplies, AirView/ card  Please call if we can help

## 2018-11-19 NOTE — Progress Notes (Signed)
Patient ID: [name] [name], female    DOB: 12/12/1929, 85 y.o.   MRN: [id number]  HPI female never smoker followed for asthma/bronchitis, allergic rhinitis, OSA/CPAP, complicated by history DVT, history lipomas, DM, GERD NPSG 01/15/11- confirmed mild OSA 7.6/hr ---------------------------------------------------------------------------------  06/27/2018- Virtual Visit via Telephone Note History of Present Illness:  85 year old female never smoker followed for asthma/bronchitis, allergic rhinitis, OSA/CPAP, complicated by history DVT, lipomas, DM, GERD CPAP auto 5-15/[facility]>> today change to 5-12 Download compliance 43%- reflecting start of recording with this machine April 3, but good since then. AHI 3.8. -----osa follow up, having problems sleeping on side with cpap blowing air into her mouth  She got a new CPAP machine in April, with no change in mask style and same settings. This machine seems to be blowing harder, blowing air out mouth if she is on L side. Asthma well-controlled. Denies acute illness/ Covid exposures. She is largely house-confined, carrying for debilitated family. She would like bronchodilators refilled, but using neb and rescue infrequently.   Observations/Objective: NPSG 01/15/11- confirmed mild OSA 7.6/hr Download 43% compliance3/17-4/15/2020 but restarted regular use on 4/3, AHI 3.8/ hr Assessment and Plan: OSA- benefits. New machine blowing harder. Will reduce pressure range.   11/19/2018- Virtual Visit via Telephone Note  I connected with [name] [name] on 11/19/18 at  9:30 AM EDT by telephone and verified that I am speaking with the correct person using two identifiers.  Location: Patient: H Provider:O   I discussed the limitations, risks, security and privacy concerns of performing an evaluation and management service by telephone and the availability of in person appointments. I also discussed with the patient that there may be a patient responsible  charge related to this service. The patient expressed understanding and agreed to proceed.   History of Present Illness: 85 year old female never smoker followed for asthma/bronchitis, allergic rhinitis, OSA/CPAP, complicated by history DVT, lipomas, DM, GERD CPAP auto 5-12/ [facility] Needs documentation of use with replacement machine since last televisit. -----OSA on CPAP auto 5-12, DME: [facility]; pt states machine is not turning off as it should Asthma epsiode resolved with rescue inhaler and nebulizer. Flared after yard work. Stressed by age, need to care for husband who has had strokes, until [place] can replace home care provider. 1 daughter dies this Spring. Other daughter ill with lupus.  Plans flu vax w PCP in Oct.    Observations/Objective: Download compliance 100%, AHI 2.6/  Assessment and Plan: OSA- benefits. Plan continue auto 5-12. [employer] service machine.  Asthma- flare after yard work( pollen/ dust). Responded to neb and rescue albuterol Follow Up Instructions: 6 months   I discussed the assessment and treatment plan with the patient. The patient was provided an opportunity to ask questions and all were answered. The patient agreed with the plan and demonstrated an understanding of the instructions.   The patient was advised to call back or seek an in-person evaluation if the symptoms worsen or if the condition fails to improve as anticipated.  I provided 18 minutes of non-face-to-face time during this encounter.   Baird Lyons, MD         ROS-See HPI    + = positive Constitutional:   +   weight loss, night sweats, fevers, chills, fatigue, lassitude. HEENT:   +  headaches, no-difficulty swallowing, tooth/dental problems, sore throat,       Little sneezing, itching, ear ache, +nasal congestion, post nasal drip,  CV:  No-   chest pain, orthopnea, PND, swelling in lower  extremities, anasarca, dizziness, palpitations Resp: + shortness of breath with exertion or at rest.               No-   productive cough,  No non-productive cough,  No- coughing up of blood.              No-   change in color of mucus.  No- wheezing.   Skin: No-   rash or lesions. GI:  No-   heartburn, indigestion, abdominal pain, nausea, vomiting,  GU:  MS:  No-   joint pain or swelling.   Neuro-    + tremor Psych:  No- change in mood or affect. No depression or anxiety.  No memory loss.     Objective:   Physical Exam General- Alert, Oriented, Affect-appropriate, Distress- none acute,  +obese Skin- clear Lymphadenopathy- none Head- atraumatic            Eyes- Gross vision intact, PERRLA, conjunctivae clear secretions            Ears- Hearing, canals-normal            Nose- minimal turbinate edema, no-Septal dev, mucus, polyps, erosion, perforation             Throat- Mallampati III , mucosa clear , drainage- none, tonsils- atrophic;                         +mild chronic hoarseness-unchanged. Neck- flexible , trachea midline, no stridor , thyroid nl, carotid no bruit Chest - symmetrical excursion , unlabored           Heart/CV- RRR , no murmur , no gallop  , no rub, nl s1 s2                           - JVD- none , edema- none, stasis changes- none, varices- none           Lung- clear to P&A, wheeze- none, cough- none , dullness-none, rub- none           Chest wall-  Abd- Br/ Gen/ Rectal- Not done, not indicated Extrem- cyanosis- none, clubbing, none, atrophy- none, strength- nl. + varices at ankles, + elastic hose Neuro- +resting tremor

## 2018-11-21 NOTE — Assessment & Plan Note (Signed)
Benefits with good compliance and control Plan- service machine, continue auto 5-12

## 2018-11-21 NOTE — Assessment & Plan Note (Signed)
Controlled exacerbation after dust/ pollen exposure No changes needed. Meds discussed

## 2019-05-19 ENCOUNTER — Ambulatory Visit: Payer: Medicare Other | Admitting: Internal Medicine

## 2019-06-18 ENCOUNTER — Telehealth (INDEPENDENT_AMBULATORY_CARE_PROVIDER_SITE_OTHER): Payer: Medicare Other | Admitting: Cardiology

## 2019-06-18 ENCOUNTER — Encounter: Payer: Self-pay | Admitting: Cardiology

## 2019-06-18 VITALS — Ht 64.0 in | Wt 190.0 lb

## 2019-06-18 DIAGNOSIS — G4730 Sleep apnea, unspecified: Secondary | ICD-10-CM | POA: Insufficient documentation

## 2019-06-18 DIAGNOSIS — Z86718 Personal history of other venous thrombosis and embolism: Secondary | ICD-10-CM | POA: Diagnosis not present

## 2019-06-18 DIAGNOSIS — Z87898 Personal history of other specified conditions: Secondary | ICD-10-CM | POA: Diagnosis not present

## 2019-06-18 DIAGNOSIS — Z7901 Long term (current) use of anticoagulants: Secondary | ICD-10-CM

## 2019-06-18 DIAGNOSIS — I10 Essential (primary) hypertension: Secondary | ICD-10-CM | POA: Diagnosis not present

## 2019-06-18 DIAGNOSIS — G4733 Obstructive sleep apnea (adult) (pediatric): Secondary | ICD-10-CM | POA: Diagnosis not present

## 2019-06-18 DIAGNOSIS — N289 Disorder of kidney and ureter, unspecified: Secondary | ICD-10-CM | POA: Insufficient documentation

## 2019-06-18 NOTE — Progress Notes (Signed)
Virtual Visit via Telephone Note   This visit type was conducted due to national recommendations for restrictions regarding the COVID-19 Pandemic (e.g. social distancing) in an effort to limit this patient's exposure and mitigate transmission in our community.  Due to her co-morbid illnesses, this patient is at least at moderate risk for complications without adequate follow up.  This format is felt to be most appropriate for this patient at this time.  The patient did not have access to video technology/had technical difficulties with video requiring transitioning to audio format only (telephone).  All issues noted in this document were discussed and addressed.  No physical exam could be performed with this format.  Please refer to the patient's chart for her  consent to telehealth for [facility].   The patient was identified using 2 identifiers.  Date:  06/18/2019   ID:  [name] [name], DOB 1929-08-26, MRN [id number]  Patient Location: Home Provider Location: Home  PCP:  Marton Redwood, MD  Cardiologist:  Dr Gwenlyn Found Electrophysiologist:  None   Evaluation Performed:  Follow-Up Visit  Chief Complaint:  none  History of Present Illness:    [name] [name] is a delightful  86 y.o. female with a history of syncope and collapse in April 2019.  The patient was admitted through the emergency room.  She was kept overnight.  She did injure her foot and was sent home with a cast.  I do not believe there was an actual fracture.  Labs during that admission showed a BUN of 50 and a creatinine of 2.1.  I could find no further renal function studies in epic.  She is followed by Dr. Brigitte Pulse on a regular basis.  She tells me he told her she is "doing fine".  She saw Dr. Gwenlyn Found for work-up.  Carotid Dopplers showed no significant ICA stenosis.  Echocardiogram July 2019 showed normal LV function with mild LVH and grade 2 diastolic dysfunction.  She had mild pulmonary hypertension with a PA pressure of 33  mmHg.  A 30-day monitor showed no arrhythmias.  She has not seen [place] since 2019.    She has not had recurrent syncope.  She lives at home with her 6 year old husband.  He has had 2 strokes and is homebound.  They have an aide that comes in daily.  Her son brings in their groceries.  The only time she goes out is to get her INR checked, they do this in her car.  Other medical issues include hypertension, sleep apnea, on CPAP, and a history of left lower extremity DVT on chronic Coumadin therapy.  The patient does not have symptoms concerning for COVID-19 infection (fever, chills, cough, or new shortness of breath).    Past Medical History:  Diagnosis Date  . Allergic rhinitis, cause unspecified   . Asthma   . Esophageal reflux   . Sleep apnea    on C-pap- Dr young follows  . Syncope 2019   Normal LVF, negative monitor  . Type II or unspecified type diabetes mellitus without mention of complication, not stated as uncontrolled   . Unspecified essential hypertension    mild LVH, normal LVF, grade 2 DD by echo 2019   Past Surgical History:  Procedure Laterality Date  . APPENDECTOMY    . FIXATION KYPHOPLASTY LUMBAR SPINE    . HIATAL HERNIA REPAIR    . LUMBAR SPINE SURGERY    . TUBAL LIGATION    . VAGINAL HYSTERECTOMY  Current Meds  Medication Sig  . albuterol (PROAIR HFA) 108 (90 Base) MCG/ACT inhaler 2 puffs every 4-6 hours as directed- rescue  . albuterol (PROVENTIL) (2.5 MG/3ML) 0.083% nebulizer solution Take 3 mLs (2.5 mg total) by nebulization every 6 (six) hours as needed for wheezing or shortness of breath. DX J45.998  . BD PEN NEEDLE NANO U/F 32G X 4 MM MISC USE TO INJECT INSULIN ONCE D  . Blood Glucose Monitoring Suppl (ONE TOUCH ULTRA SYSTEM KIT) W/DEVICE KIT 1 kit by Does not apply route once.    . Cholecalciferol (VITAMIN D3) 3000 UNITS TABS Take 1 capsule by mouth daily.  . cloNIDine (CATAPRES) 0.1 MG tablet Take 0.1 mg by mouth 3 (three) times daily.   . diazepam  (VALIUM) 5 MG tablet Take 5 mg by mouth 2 (two) times daily.   . diclofenac sodium (VOLTAREN) 1 % GEL Apply 2 g topically 4 (four) times daily as needed.   . doxycycline (VIBRA-TABS) 100 MG tablet Take 1 tablet (100 mg total) by mouth 2 (two) times daily.  Marland Kitchen ezetimibe-simvastatin (VYTORIN) 10-40 MG per tablet Take 1 tablet by mouth at bedtime.    . furosemide (LASIX) 40 MG tablet Take 40 mg by mouth daily.  . Lancets MISC by Does not apply route daily.    Marland Kitchen linagliptin (TRADJENTA) 5 MG TABS tablet Take 5 mg by mouth daily.  Marland Kitchen Lysine 500 MG TABS Take 1 tablet by mouth daily.   . metoprolol (LOPRESSOR) 50 MG tablet Take 1 tablet by mouth 2 (two) times daily.  . Multiple Vitamins-Minerals (PRESERVISION AREDS 2 PO) Take 1 tablet by mouth 2 (two) times daily.  . multivitamin-lutein (OCUVITE-LUTEIN) CAPS Take 1 capsule by mouth daily.    . NEOMYCIN-POLYMYXIN-HYDROCORTISONE (CORTISPORIN) 1 % SOLN OTIC solution Apply 1-2 drops to toe BID after soaking  . omeprazole (PRILOSEC) 20 MG capsule Take 20 mg by mouth daily.    . ONE TOUCH ULTRA TEST test strip   . simvastatin (ZOCOR) 40 MG tablet Take 40 mg by mouth daily at 6 PM.   . telmisartan (MICARDIS) 80 MG tablet Take 1 tablet by mouth daily.  [name] SOLOSTAR 300 UNIT/ML SOPN Inject 25 Units into the skin daily.   . traMADol (ULTRAM) 50 MG tablet Take 1 tablet (50 mg total) by mouth every 6 (six) hours as needed.  . vitamin B-12 (CYANOCOBALAMIN) 1000 MCG tablet Take 1,000 mcg by mouth daily.    Marland Kitchen warfarin (COUMADIN) 6 MG tablet M/F add extra 37m to make 941m    Allergies:   Aspirin, Codeine, Ibuprofen, Meperidine hcl, and Augmentin [amoxicillin-pot clavulanate]   Social History   Tobacco Use  . Smoking status: Never Smoker  . Smokeless tobacco: Never Used  Substance Use Topics  . Alcohol use: Never  . Drug use: Never     Family Hx: The patient's family history includes Diabetes in her mother; Lung cancer in her brother.  ROS:   Please  see the history of present illness.    She and her husband will get COVID vaccine this weekend All other systems reviewed and are negative.   Prior CV studies:   The following studies were reviewed today: Echo July 2019 Monitor July 2019  Labs/Other Tests and Data Reviewed:    EKG:  An ECG dated 07/07/2017 was personally reviewed today and demonstrated:  NSR-HR 63, NSST chnages, QTc 423  Recent Labs: No results found for requested labs within last 8760 hours.   Recent Lipid  Panel No results found for: CHOL, TRIG, HDL, CHOLHDL, LDLCALC, LDLDIRECT  Wt Readings from Last 3 Encounters:  06/18/19 190 lb (86.2 kg)  08/28/17 186 lb (84.4 kg)  08/02/17 197 lb (89.4 kg)     Objective:    Vital Signs:  Ht '5\' 4"'  (1.626 m)   Wt 190 lb (86.2 kg)   BMI 32.61 kg/m    VITAL SIGNS:  reviewed  ASSESSMENT & PLAN:    H/O syncope- April 2019- no recurrence cardiology w/u unremarkable.  Possibly secondary to dehydration.   HTN- Followed by PCP. Normal LVF, grade 2 DD   OSA- Compliant with C-pap. Followed by DR Annamaria Boots  H/O DVT- On chronic Coumadin rx-PCP follows  Renal insufficiency- SCr was 2.1 in April 2019 when admitted to ED with syncope.  PCP follows.   Plan: F/U in one year- virtual OK.  She is to see Dr Brigitte Pulse in a week or two.   COVID-19 Education: The signs and symptoms of COVID-19 were discussed with the patient and how to seek care for testing (follow up with PCP or arrange E-visit).  The importance of social distancing was discussed today.  Time:   Today, I have spent 20 minutes with the patient with telehealth technology discussing the above problems.     Medication Adjustments/Labs and Tests Ordered: Current medicines are reviewed at length with the patient today.  Concerns regarding medicines are outlined above.   Tests Ordered: No orders of the defined types were placed in this encounter.   Medication Changes: No orders of the defined types were placed in  this encounter.   Follow Up:  Either In Person or Virtual in one year with Dr Gwenlyn Found  Signed, Kerin Ransom, PA-C  06/18/2019 11:34 AM    [place]

## 2019-06-18 NOTE — Patient Instructions (Signed)
Medication Instructions:   Your physician recommends that you continue on your current medications as directed. Please refer to the Current Medication list given to you today.  *If you need a refill on your cardiac medications before your next appointment, please call your pharmacy*  Lab Work:  None ordered today  Testing/Procedures:  None ordered today  Follow-Up: At [facility], you and your health needs are our priority.  As part of our continuing mission to provide you with exceptional heart care, we have created designated Provider Care Teams.  These Care Teams include your primary Cardiologist (physician) and Advanced Practice Providers (APPs -  Physician Assistants and Nurse Practitioners) who all work together to provide you with the care you need, when you need it.  We recommend signing up for the patient portal called "MyChart".  Sign up information is provided on this After Visit Summary.  MyChart is used to connect with patients for Virtual Visits (Telemedicine).  Patients are able to view lab/test results, encounter notes, upcoming appointments, etc.  Non-urgent messages can be sent to your provider as well.   To learn more about what you can do with MyChart, go to [email or web address].    Your next appointment:   12 month(s)  The format for your next appointment:   Either In Person or Virtual  Provider:   Quay Burow, MD

## 2019-06-29 ENCOUNTER — Ambulatory Visit: Payer: Medicare Other | Admitting: Internal Medicine

## 2019-11-11 ENCOUNTER — Other Ambulatory Visit: Payer: Self-pay | Admitting: Nephrology

## 2019-11-11 DIAGNOSIS — N184 Chronic kidney disease, stage 4 (severe): Secondary | ICD-10-CM

## 2019-11-11 DIAGNOSIS — I129 Hypertensive chronic kidney disease with stage 1 through stage 4 chronic kidney disease, or unspecified chronic kidney disease: Secondary | ICD-10-CM

## 2019-11-19 ENCOUNTER — Ambulatory Visit
Admission: RE | Admit: 2019-11-19 | Discharge: 2019-11-19 | Disposition: A | Discharge status: Home / Self Care | Payer: Medicare Other | Source: Ambulatory Visit | Attending: Nephrology | Admitting: Nephrology

## 2019-11-19 DIAGNOSIS — I129 Hypertensive chronic kidney disease with stage 1 through stage 4 chronic kidney disease, or unspecified chronic kidney disease: Secondary | ICD-10-CM

## 2019-11-19 DIAGNOSIS — N184 Chronic kidney disease, stage 4 (severe): Secondary | ICD-10-CM

## 2020-01-07 ENCOUNTER — Encounter: Payer: Self-pay | Admitting: Internal Medicine

## 2020-01-07 NOTE — Progress Notes (Signed)
Patient ID: [name] [name], female    DOB: Mar 14, 1929, 86 y.o.   MRN: [id number]  HPI female never smoker followed for asthma/bronchitis, allergic rhinitis, OSA/CPAP, complicated by history DVT, history lipomas, DM, GERD NPSG 01/15/11- confirmed mild OSA 7.6/hr ---------------------------------------------------------------------------------  11/19/2018- Virtual Visit via Telephone Note  History of Present Illness: 86 year old female never smoker followed for asthma/bronchitis, allergic rhinitis, OSA/CPAP, complicated by history DVT, lipomas, DM, GERD CPAP auto 5-12/ [employer] Needs documentation of use with replacement machine since last televisit. -----OSA on CPAP auto 5-12, DME: [employer]; pt states machine is not turning off as it should Asthma epsiode resolved with rescue inhaler and nebulizer. Flared after yard work. Stressed by age, need to care for husband who has had strokes, until [place] can replace home care provider. 1 daughter dies this Spring. Other daughter ill with lupus.  Plans flu vax w PCP in Oct.    Observations/Objective: Download compliance 100%, AHI 2.6/  Assessment and Plan: OSA- benefits. Plan continue auto 5-12. [employer] service machine.  Asthma- flare after yard work( pollen/ dust). Responded to neb and rescue albuterol Follow Up Instructions: 6 months    01/11/20- 86 year old female never smoker followed for asthma/bronchitis, allergic rhinitis, OSA/CPAP, complicated by history DVT/ Coumadin, lipomas, DM, GERD CPAP auto 5-12/ [employer] Download- compliance 100%, AHI 2.4/ hr Body weight today- 191 lbs Covid vax- J&J Flu vax- had Neb albuterol, albuterol hfa,  ------pt is here for cpap compliance   Comfortable with CPAP. Not needing any inhalers now. No resp infections this year. Stays at home caring for husband.  Mentions "2 kinds of tumor" removed from L shouder 2 y3ars ago, but she understandss no details.   ROS-See HPI    + = positive Constitutional:   +   weight  loss, night sweats, fevers, chills, fatigue, lassitude. HEENT:   +  headaches, no-difficulty swallowing, tooth/dental problems, sore throat,       Little sneezing, itching, ear ache, +nasal congestion, post nasal drip,  CV:  No-   chest pain, orthopnea, PND, swelling in lower extremities, anasarca, dizziness, palpitations Resp: + shortness of breath with exertion or at rest.              No-   productive cough,  No non-productive cough,  No- coughing up of blood.              No-   change in color of mucus.  No- wheezing.   Skin: No-   rash or lesions. GI:  No-   heartburn, indigestion, abdominal pain, nausea, vomiting,  GU:  MS:  No-   joint pain or swelling.   Neuro-    + tremor Psych:  No- change in mood or affect. No depression or anxiety.  No memory loss.     Objective:   Physical Exam General- Alert, Oriented, Affect-appropriate, Distress- none acute,  +obese, + rolling walker Skin- clear Lymphadenopathy- none Head- atraumatic            Eyes- Gross vision intact, PERRLA, conjunctivae clear secretions            Ears- Hearing, canals-normal            Nose- minimal turbinate edema, no-Septal dev, mucus, polyps, erosion, perforation             Throat- Mallampati III , mucosa clear , drainage- none, tonsils- atrophic;                         +  mild chronic hoarseness-unchanged. Neck- + softcollar Chest - symmetrical excursion , unlabored           Heart/CV- RRR , no murmur , no gallop  , no rub, nl s1 s2                           - JVD- none , edema- none, stasis changes- none, varices- none           Lung- clear to P&A, wheeze- none, cough- none , dullness-none, rub- none           Chest wall-  Abd- Br/ Gen/ Rectal- Not done, not indicated Extrem- cyanosis- none, clubbing, none, atrophy- none, strength- nl. + varices at ankles, + elastic hose Neuro- +resting tremor

## 2020-01-11 ENCOUNTER — Encounter: Payer: Self-pay | Admitting: Internal Medicine

## 2020-01-11 ENCOUNTER — Ambulatory Visit (INDEPENDENT_AMBULATORY_CARE_PROVIDER_SITE_OTHER): Payer: Medicare Other

## 2020-01-11 ENCOUNTER — Other Ambulatory Visit: Payer: Self-pay

## 2020-01-11 ENCOUNTER — Ambulatory Visit: Payer: Medicare Other | Admitting: Internal Medicine

## 2020-01-11 VITALS — BP 122/82 | HR 62 | Temp 97.9°F | Ht 63.0 in | Wt 191.8 lb

## 2020-01-11 DIAGNOSIS — J4550 Severe persistent asthma, uncomplicated: Secondary | ICD-10-CM

## 2020-01-11 DIAGNOSIS — J449 Chronic obstructive pulmonary disease, unspecified: Secondary | ICD-10-CM

## 2020-01-11 DIAGNOSIS — G4733 Obstructive sleep apnea (adult) (pediatric): Secondary | ICD-10-CM

## 2020-01-11 NOTE — Patient Instructions (Signed)
We can continue CPAP auto 5-12  Order- CXR   Dx Asthmatic Bronchitis  Please call if we can help

## 2020-01-25 NOTE — Assessment & Plan Note (Signed)
Mild persistent uncomplicated Plan- CXR

## 2020-01-25 NOTE — Assessment & Plan Note (Signed)
Benefits from CPAP with good compliance and control. Plan- continue auto 5-12 

## 2020-06-24 ENCOUNTER — Encounter (HOSPITAL_COMMUNITY): Payer: Self-pay

## 2020-06-24 ENCOUNTER — Emergency Department (HOSPITAL_COMMUNITY)
Admission: EM | Admit: 2020-06-24 | Discharge: 2020-06-24 | Disposition: A | Discharge status: Home / Self Care | Payer: Medicare Other | Attending: Emergency Medicine | Admitting: Emergency Medicine

## 2020-06-24 ENCOUNTER — Emergency Department (HOSPITAL_COMMUNITY): Payer: Medicare Other

## 2020-06-24 DIAGNOSIS — J45909 Unspecified asthma, uncomplicated: Secondary | ICD-10-CM | POA: Insufficient documentation

## 2020-06-24 DIAGNOSIS — R519 Headache, unspecified: Secondary | ICD-10-CM | POA: Diagnosis not present

## 2020-06-24 DIAGNOSIS — Z79899 Other long term (current) drug therapy: Secondary | ICD-10-CM | POA: Diagnosis not present

## 2020-06-24 DIAGNOSIS — S5011XA Contusion of right forearm, initial encounter: Secondary | ICD-10-CM | POA: Insufficient documentation

## 2020-06-24 DIAGNOSIS — W19XXXA Unspecified fall, initial encounter: Secondary | ICD-10-CM

## 2020-06-24 DIAGNOSIS — I10 Essential (primary) hypertension: Secondary | ICD-10-CM | POA: Insufficient documentation

## 2020-06-24 DIAGNOSIS — E119 Type 2 diabetes mellitus without complications: Secondary | ICD-10-CM | POA: Diagnosis not present

## 2020-06-24 DIAGNOSIS — R410 Disorientation, unspecified: Secondary | ICD-10-CM | POA: Diagnosis not present

## 2020-06-24 DIAGNOSIS — Z7901 Long term (current) use of anticoagulants: Secondary | ICD-10-CM | POA: Insufficient documentation

## 2020-06-24 DIAGNOSIS — Z7984 Long term (current) use of oral hypoglycemic drugs: Secondary | ICD-10-CM | POA: Insufficient documentation

## 2020-06-24 DIAGNOSIS — S4991XA Unspecified injury of right shoulder and upper arm, initial encounter: Secondary | ICD-10-CM | POA: Diagnosis present

## 2020-06-24 LAB — CBC WITH DIFFERENTIAL/PLATELET
Abs Immature Granulocytes: 0.07 K/uL (ref 0.00–0.07)
Basophils Absolute: 0 K/uL (ref 0.0–0.1)
Basophils Relative: 0 %
Eosinophils Absolute: 0 K/uL (ref 0.0–0.5)
Eosinophils Relative: 1 %
HCT: 39.1 % (ref 36.0–46.0)
Hemoglobin: 12.4 g/dL (ref 12.0–15.0)
Immature Granulocytes: 1 %
Lymphocytes Relative: 40 %
Lymphs Abs: 2.5 K/uL (ref 0.7–4.0)
MCH: 29.6 pg (ref 26.0–34.0)
MCHC: 31.7 g/dL (ref 30.0–36.0)
MCV: 93.3 fL (ref 80.0–100.0)
Monocytes Absolute: 0.4 K/uL (ref 0.1–1.0)
Monocytes Relative: 7 %
Neutro Abs: 3.2 K/uL (ref 1.7–7.7)
Neutrophils Relative %: 51 %
Platelets: 177 K/uL (ref 150–400)
RBC: 4.19 MIL/uL (ref 3.87–5.11)
RDW: 13.5 % (ref 11.5–15.5)
WBC: 6.2 K/uL (ref 4.0–10.5)
nRBC: 0 % (ref 0.0–0.2)

## 2020-06-24 LAB — COMPREHENSIVE METABOLIC PANEL WITH GFR
ALT: 28 U/L (ref 0–44)
AST: 27 U/L (ref 15–41)
Albumin: 4.5 g/dL (ref 3.5–5.0)
Alkaline Phosphatase: 68 U/L (ref 38–126)
Anion gap: 8 (ref 5–15)
BUN: 66 mg/dL — ABNORMAL HIGH (ref 8–23)
CO2: 26 mmol/L (ref 22–32)
Calcium: 10.7 mg/dL — ABNORMAL HIGH (ref 8.9–10.3)
Chloride: 110 mmol/L (ref 98–111)
Creatinine, Ser: 2.74 mg/dL — ABNORMAL HIGH (ref 0.44–1.00)
GFR, Estimated: 16 mL/min — ABNORMAL LOW
Glucose, Bld: 123 mg/dL — ABNORMAL HIGH (ref 70–99)
Potassium: 4.3 mmol/L (ref 3.5–5.1)
Sodium: 144 mmol/L (ref 135–145)
Total Bilirubin: 0.6 mg/dL (ref 0.3–1.2)
Total Protein: 8 g/dL (ref 6.5–8.1)

## 2020-06-24 LAB — URINALYSIS, ROUTINE W REFLEX MICROSCOPIC
Bilirubin Urine: NEGATIVE
Glucose, UA: NEGATIVE mg/dL
Hgb urine dipstick: NEGATIVE
Ketones, ur: NEGATIVE mg/dL
Nitrite: NEGATIVE
Protein, ur: NEGATIVE mg/dL
Specific Gravity, Urine: 1.006 (ref 1.005–1.030)
pH: 5 (ref 5.0–8.0)

## 2020-06-24 LAB — PROTIME-INR
INR: 2 — ABNORMAL HIGH (ref 0.8–1.2)
Prothrombin Time: 23 seconds — ABNORMAL HIGH (ref 11.4–15.2)

## 2020-06-24 LAB — CBG MONITORING, ED: Glucose-Capillary: 111 mg/dL — ABNORMAL HIGH (ref 70–99)

## 2020-06-24 LAB — MAGNESIUM: Magnesium: 2 mg/dL (ref 1.7–2.4)

## 2020-06-24 LAB — PHOSPHORUS: Phosphorus: 3.6 mg/dL (ref 2.5–4.6)

## 2020-06-24 NOTE — ED Notes (Signed)
Pt used steady to ambulate to restroom. Pt unsteady and states she feels too weak to walk on her own.

## 2020-06-24 NOTE — ED Notes (Signed)
Went over discharge instructions, advised daughter to call neurology office for follow-up, removed pt IV, helped pt get changed, got wheelchair for pt and took out to front, daughter driving pt home.

## 2020-06-24 NOTE — ED Triage Notes (Signed)
Pt presents via EMS from home for right arm pain and bruising, pain with ambulation and headache after a fall at the mailbox one week ago. Pt was not evaluated at that time. Pt takes Coumadin.

## 2020-06-24 NOTE — ED Notes (Signed)
Provider at bedside to evaluate.

## 2020-06-24 NOTE — ED Provider Notes (Signed)
[facility] DEPT Provider Note   CSN: [id number] Arrival date & time: 06/24/20  1150     History Chief Complaint  Patient presents with  . Fall    Fall one week ago with bruising and pain to right arm, pain with ambulation, headache since    [name] [name] is a 87 y.o. female.  The history is provided by the patient, a relative and medical records.  Fall   [name] [name] is a 87 y.o. female who presents to the Emergency Department complaining of fall.  [place] one week ago brining the trash can in.  Level V caveat due to confusion.  Hx is provided by the patient's daughter. Daughter reports that she has a history of confusion since 2011. Symptoms have been worsening over the last couple of days to a week. She did have a mechanical fall one week ago when she was bringing in the trash. She struck her right arm. Daughter also complains of hypoglycemia every morning with blood sugars in the 50s. This is despite decreasing her home insulin - ongoing issue for weeks to months.. Patient states that she does feel confused. She also complains of chronic headache. She is unsure if she takes her medications or not. She does live with her husband.    Denies fever, cough, vomiting, diarrhea.       Past Medical History:  Diagnosis Date  . Allergic rhinitis, cause unspecified   . Asthma   . Esophageal reflux   . Sleep apnea    on C-pap- Dr young follows  . Syncope 2019   Normal LVF, negative monitor  . Type II or unspecified type diabetes mellitus without mention of complication, not stated as uncontrolled   . Unspecified essential hypertension    mild LVH, normal LVF, grade 2 DD by echo 2019    Patient Active Problem List   Diagnosis Date Noted  . Renal insufficiency 06/18/2019  . Sleep apnea   . Essential hypertension   . Hyperlipidemia 08/28/2017  . History of syncope 08/28/2017  . Acute frontal sinusitis 09/20/2013  . Obstructive sleep apnea  02/08/2011  . RENAL INSUFFICIENCY 09/29/2007  . DIABETES, TYPE 2 03/31/2007  . OBESITY 03/31/2007  . Venous insufficiency (chronic) (peripheral) 03/31/2007  . Asthmatic bronchitis , chronic ([place]) 03/31/2007  . Seasonal and perennial allergic rhinitis 03/31/2007  . Asthma, mild persistent 03/31/2007  . G E R D 03/31/2007    Past Surgical History:  Procedure Laterality Date  . APPENDECTOMY    . FIXATION KYPHOPLASTY LUMBAR SPINE    . HIATAL HERNIA REPAIR    . LUMBAR SPINE SURGERY    . TUBAL LIGATION    . VAGINAL HYSTERECTOMY       OB History   No obstetric history on file.     Family History  Problem Relation Age of Onset  . Lung cancer Brother   . Diabetes Mother     Social History   Tobacco Use  . Smoking status: Never Smoker  . Smokeless tobacco: Never Used  Vaping Use  . Vaping Use: Never used  Substance Use Topics  . Alcohol use: Never  . Drug use: Never    Home Medications Prior to Admission medications   Medication Sig Start Date End Date Taking? Authorizing Provider  albuterol (PROAIR HFA) 108 (90 Base) MCG/ACT inhaler 2 puffs every 4-6 hours as directed- rescue 06/27/18   Baird Lyons D, MD  albuterol (PROVENTIL) (2.5 MG/3ML) 0.083% nebulizer solution Take 3  mLs (2.5 mg total) by nebulization every 6 (six) hours as needed for wheezing or shortness of breath. DX O53.664 06/27/18   Deneise Lever, MD  BD PEN NEEDLE NANO U/F 32G X 4 MM MISC USE TO INJECT INSULIN ONCE D 01/16/18   [provider]  Blood Glucose Monitoring Suppl (ONE TOUCH ULTRA SYSTEM KIT) W/DEVICE KIT 1 kit by Does not apply route once.      [provider]  Cholecalciferol (VITAMIN D3) 3000 UNITS TABS Take 1 capsule by mouth daily.    [provider]  cloNIDine (CATAPRES) 0.1 MG tablet Take 0.1 mg by mouth 3 (three) times daily.     [provider]  diazepam (VALIUM) 5 MG tablet Take 5 mg by mouth 2 (two) times daily.     [provider]   diclofenac sodium (VOLTAREN) 1 % GEL Apply 2 g topically 4 (four) times daily as needed.     [provider]  doxycycline (VIBRA-TABS) 100 MG tablet Take 1 tablet (100 mg total) by mouth 2 (two) times daily. 04/17/18   Hyatt, Max T, DPM  ezetimibe-simvastatin (VYTORIN) 10-40 MG per tablet Take 1 tablet by mouth at bedtime.      [provider]  furosemide (LASIX) 40 MG tablet Take 40 mg by mouth daily.    [provider]  Lancets MISC by Does not apply route daily.      [provider]  linagliptin (TRADJENTA) 5 MG TABS tablet Take 5 mg by mouth daily.    [provider]  Lysine 500 MG TABS Take 1 tablet by mouth daily.     [provider]  metoprolol (LOPRESSOR) 50 MG tablet Take 1 tablet by mouth 2 (two) times daily. 01/29/13   [provider]  Multiple Vitamins-Minerals (PRESERVISION AREDS 2 PO) Take 1 tablet by mouth 2 (two) times daily.    [provider]  multivitamin-lutein (OCUVITE-LUTEIN) CAPS Take 1 capsule by mouth daily.      [provider]  NEOMYCIN-POLYMYXIN-HYDROCORTISONE (CORTISPORIN) 1 % SOLN OTIC solution Apply 1-2 drops to toe BID after soaking 04/03/18   Hyatt, Max T, DPM  omeprazole (PRILOSEC) 20 MG capsule Take 20 mg by mouth daily.      [provider]  ONE TOUCH ULTRA TEST test strip  06/21/14   [provider]  simvastatin (ZOCOR) 40 MG tablet Take 40 mg by mouth daily at 6 PM.  01/15/18   [provider]  telmisartan (MICARDIS) 80 MG tablet Take 1 tablet by mouth daily. 07/18/13   [provider]  TOUJEO SOLOSTAR 300 UNIT/ML SOPN Inject 25 Units into the skin daily.  11/17/17   [provider]  traMADol (ULTRAM) 50 MG tablet Take 1 tablet (50 mg total) by mouth every 6 (six) hours as needed. 07/08/17   Montine Circle, PA-C  vitamin B-12 (CYANOCOBALAMIN) 1000 MCG tablet Take 1,000 mcg by mouth daily.      [provider]  warfarin (COUMADIN) 6 MG  tablet M/F add extra 61m to make 959m1/28/20   [provider]    Allergies    Aspirin, Codeine, Ibuprofen, Meperidine hcl, and Augmentin [amoxicillin-pot clavulanate]  Review of Systems   Review of Systems  All other systems reviewed and are negative.   Physical Exam Updated Vital Signs BP (!) 180/61   Pulse (!) 55   Temp 97.9 F (36.6 C)   Resp 15   Ht '5\' 3"'  (1.6 m)   Wt 87  kg   SpO2 98%   BMI 33.98 kg/m   Physical Exam Vitals and nursing note reviewed.  Constitutional:      Appearance: She is well-developed.  HENT:     Head: Normocephalic and atraumatic.  Neck:     Comments: TTP over posterior neck Cardiovascular:     Rate and Rhythm: Normal rate and regular rhythm.     Heart sounds: No murmur heard.   Pulmonary:     Effort: Pulmonary effort is normal. No respiratory distress.     Breath sounds: Normal breath sounds.  Abdominal:     Palpations: Abdomen is soft.     Tenderness: There is no abdominal tenderness. There is no guarding or rebound.  Musculoskeletal:     Comments: Nonpitting edema to BLE.  ROM intact in bilateral shoulders, elbows.    Skin:    General: Skin is warm and dry.  Neurological:     Mental Status: She is alert.     Comments: Oriented to person and place.  Disoriented to time.  Generalized weakness.    Psychiatric:        Behavior: Behavior normal.     ED Results / Procedures / Treatments   Labs (all labs ordered are listed, but only abnormal results are displayed) Labs Reviewed  COMPREHENSIVE METABOLIC PANEL - Abnormal; Notable for the following components:      Result Value   Glucose, Bld 123 (*)    BUN 66 (*)    Creatinine, Ser 2.74 (*)    Calcium 10.7 (*)    GFR, Estimated 16 (*)    All other components within normal limits  URINALYSIS, ROUTINE W REFLEX MICROSCOPIC - Abnormal; Notable for the following components:   Leukocytes,Ua MODERATE (*)    Bacteria, UA RARE (*)    All other components within normal limits   PROTIME-INR - Abnormal; Notable for the following components:   Prothrombin Time 23.0 (*)    INR 2.0 (*)    All other components within normal limits  CBG MONITORING, ED - Abnormal; Notable for the following components:   Glucose-Capillary 111 (*)    All other components within normal limits  URINE CULTURE  CBC WITH DIFFERENTIAL/PLATELET  MAGNESIUM  PHOSPHORUS    EKG EKG Interpretation  Date/Time:  Friday June 24 2020 12:13:12 EDT Ventricular Rate:  50 PR Interval:  217 QRS Duration: 94 QT Interval:  436 QTC Calculation: 398 R Axis:   -5 Text Interpretation: Sinus or ectopic atrial rhythm Atrial premature complex Borderline prolonged PR interval Abnormal R-wave progression, early transition Confirmed by Quintella Reichert [phone]) on 06/24/2020 2:18:08 PM   Radiology DG Chest 2 View  Result Date: 06/24/2020 CLINICAL DATA:  87 year old female with a history of confusion EXAM: CHEST - 2 VIEW COMPARISON:  01/11/2020 FINDINGS: Cardiomediastinal silhouette unchanged in size and contour. No evidence of central vascular congestion. No interlobular septal thickening. No pneumothorax or pleural effusion. No confluent airspace disease. Surgical changes at the region of the GE junction. Degenerative changes the spine. Surgical changes of the lumbar region incompletely imaged. No acute displaced fracture IMPRESSION: Negative for acute cardiopulmonary disease Electronically Signed   By: Corrie Mckusick D.O.   On: 06/24/2020 14:17   CT Head Wo Contrast  Result Date: 06/24/2020 CLINICAL DATA:  [place] while at the mailbox 1 week ago. Patient takes Coumadin. Right arm pain and bruising. Headache. EXAM: CT HEAD WITHOUT CONTRAST CT CERVICAL SPINE WITHOUT CONTRAST TECHNIQUE: Multidetector CT imaging of the head and cervical spine was  performed following the standard protocol without intravenous contrast. Multiplanar CT image reconstructions of the cervical spine were also generated. COMPARISON:  09/01/2009  FINDINGS: CT HEAD FINDINGS Brain: No evidence of acute infarction, hemorrhage, hydrocephalus, extra-axial collection or mass lesion/mass effect. There is ventricular sulcal enlargement reflecting age-appropriate volume loss. Vascular: No hyperdense vessel or unexpected calcification. Skull: Normal. Negative for fracture or focal lesion. Sinuses/Orbits: Visualized globes and orbits are unremarkable. Visualized sinuses are clear. Other: None. CT CERVICAL SPINE FINDINGS Alignment: Reversal of the normal cervical lordosis, apex at C4. No spondylolisthesis. Skull base and vertebrae: No acute fracture. No primary bone lesion or focal pathologic process. Soft tissues and spinal canal: No prevertebral fluid or swelling. No visible canal hematoma. Disc levels: Moderate loss of disc height at C3-C4 with moderate to marked loss of disc height at C4-C5, C5-C6 and C6-C7. Spondylotic disc bulging and endplate spurring noted at these levels. No convincing disc herniation. Bilateral facet degenerative changes, greater on the right at C3-C4 and on the left at C2-C3. Upper chest: No acute findings.  Clear lung apices. Other: None. IMPRESSION: HEAD CT 1. No acute intracranial abnormalities.  No skull fracture. CERVICAL CT 1. No fracture or acute finding. Electronically Signed   By: Lajean Manes M.D.   On: 06/24/2020 14:18   CT Cervical Spine Wo Contrast  Result Date: 06/24/2020 CLINICAL DATA:  [place] while at the mailbox 1 week ago. Patient takes Coumadin. Right arm pain and bruising. Headache. EXAM: CT HEAD WITHOUT CONTRAST CT CERVICAL SPINE WITHOUT CONTRAST TECHNIQUE: Multidetector CT imaging of the head and cervical spine was performed following the standard protocol without intravenous contrast. Multiplanar CT image reconstructions of the cervical spine were also generated. COMPARISON:  09/01/2009 FINDINGS: CT HEAD FINDINGS Brain: No evidence of acute infarction, hemorrhage, hydrocephalus, extra-axial collection or mass  lesion/mass effect. There is ventricular sulcal enlargement reflecting age-appropriate volume loss. Vascular: No hyperdense vessel or unexpected calcification. Skull: Normal. Negative for fracture or focal lesion. Sinuses/Orbits: Visualized globes and orbits are unremarkable. Visualized sinuses are clear. Other: None. CT CERVICAL SPINE FINDINGS Alignment: Reversal of the normal cervical lordosis, apex at C4. No spondylolisthesis. Skull base and vertebrae: No acute fracture. No primary bone lesion or focal pathologic process. Soft tissues and spinal canal: No prevertebral fluid or swelling. No visible canal hematoma. Disc levels: Moderate loss of disc height at C3-C4 with moderate to marked loss of disc height at C4-C5, C5-C6 and C6-C7. Spondylotic disc bulging and endplate spurring noted at these levels. No convincing disc herniation. Bilateral facet degenerative changes, greater on the right at C3-C4 and on the left at C2-C3. Upper chest: No acute findings.  Clear lung apices. Other: None. IMPRESSION: HEAD CT 1. No acute intracranial abnormalities.  No skull fracture. CERVICAL CT 1. No fracture or acute finding. Electronically Signed   By: Lajean Manes M.D.   On: 06/24/2020 14:18    Procedures Procedures   Medications Ordered in ED Medications - No data to display  ED Course  I have reviewed the triage vital signs and the nursing notes.  Pertinent labs & imaging results that were available during my care of the patient were reviewed by me and considered in my medical decision making (see chart for details).    MDM Rules/Calculators/A&P                         patient with history of hypertension, diabetes, CKD here for evaluation of progressive confusion over months to years.  She did have a fall one week ago. She is confused on ED evaluation but with no focal neurologic deficits. Imaging is negative for subdural hematoma, CVA. Labs with stable renal insufficiency. UA is not consistent with UTI,  will send culture and treat if it comes back positive. Discussed with patient and daughter importance of neurology as well as PCP follow-up for further evaluation of her confusion. Return precautions discussed.  Final Clinical Impression(s) / ED Diagnoses Final diagnoses:  Confusion with non-focal neuro exam  Fall, initial encounter    Rx / DC Orders ED Discharge Orders         Ordered    Ambulatory referral to Neurology       Comments: An appointment is requested in approximately: 1 week   06/24/20 1514           Quintella Reichert, MD 06/24/20 1530

## 2020-06-24 NOTE — ED Notes (Signed)
Pt ambulated well to restroom with standby assist.

## 2020-06-27 LAB — URINE CULTURE: Culture: >=100000 — AB

## 2020-06-28 ENCOUNTER — Telehealth: Payer: Self-pay | Admitting: Emergency Medicine

## 2020-06-28 NOTE — Telephone Encounter (Signed)
Post ED Visit - Positive Culture Follow-up  Culture report reviewed by antimicrobial stewardship pharmacist: [facility] Team '[]'$  Elenor Quinones, Pharm.D. '[]'$  Heide Guile, Pharm.D., BCPS AQ-ID '[]'$  Parks Neptune, Pharm.D., BCPS '[]'$  Alycia Rossetti, Pharm.D., BCPS '[]'$  [place], Pharm.D., BCPS, AAHIVP '[]'$  Legrand Como, Pharm.D., BCPS, AAHIVP '[]'$  Salome Arnt, PharmD, BCPS '[]'$  Johnnette Gourd, PharmD, BCPS '[]'$  Hughes Better, PharmD, BCPS '[]'$  Leeroy Cha, PharmD '[]'$  Laqueta Linden, PharmD, BCPS '[]'$  Albertina Parr, PharmD  [place] Team '[]'$  Leodis Sias, PharmD '[]'$  Lindell Spar, PharmD '[]'$  Royetta Asal, PharmD '[]'$  Graylin Shiver, Rph '[]'$  Rema Fendt) Glennon Mac, PharmD '[]'$  Arlyn Dunning, PharmD '[]'$  Netta Cedars, PharmD '[]'$  Dia Sitter, PharmD '[]'$  Leone Haven, PharmD '[]'$  Gretta Arab, PharmD '[]'$  Theodis Shove, PharmD '[]'$  Peggyann Juba, PharmD '[]'$  Reuel Boom, PharmD   Positive urine culture Treated with none, asymptomatic,and no further patient follow-up is required at this time.  Hazle Nordmann 06/28/2020, 10:20 AM

## 2020-06-29 ENCOUNTER — Encounter: Payer: Self-pay | Admitting: Diagnostic Neuroimaging

## 2020-06-29 ENCOUNTER — Ambulatory Visit: Payer: Medicare Other | Admitting: Diagnostic Neuroimaging

## 2020-06-29 VITALS — BP 129/54 | HR 51 | Ht 65.0 in | Wt 183.6 lb

## 2020-06-29 DIAGNOSIS — R269 Unspecified abnormalities of gait and mobility: Secondary | ICD-10-CM

## 2020-06-29 DIAGNOSIS — G250 Essential tremor: Secondary | ICD-10-CM | POA: Diagnosis not present

## 2020-06-29 DIAGNOSIS — G3184 Mild cognitive impairment, so stated: Secondary | ICD-10-CM

## 2020-06-29 NOTE — Patient Instructions (Signed)
CONFUSION (post-concussion syndrome) - may have had underlying cognitive issues for several years (mild cognitive impairment + sleep apnea) - continue supportive care - safety / supervision issues reviewed - daily physical activity / exercise (at least 15-30 minutes) - eat more plants / vegetables - increase social activities, brain stimulation, games, puzzles, hobbies, crafts, arts, music - aim for at least 7-8 hours sleep per night (or more) - avoid smoking and alcohol - caregiver resources provided - caution with medications, finances; no driving

## 2020-06-29 NOTE — Progress Notes (Signed)
[facility]  PATIENT: [name] [name] DOB: 02/29/1930  REFERRING CLINICIAN: Quintella Reichert, MD HISTORY FROM: patient  REASON FOR VISIT: new consult    HISTORICAL  CHIEF COMPLAINT:  Chief Complaint  Patient presents with  . Confusion    Rm 7 New Pt  dgtr-Lisa  MMSE 23  "multiple falls"    HISTORY OF PRESENT ILLNESS:   87 year old female here for evaluation of confusion.  Patient has had onset of memory loss and confusion since 2011.  Initially this was after back surgery and anesthesia.  Over the years she has done fairly well, serving as primary caregiver for her husband who has had strokes.  Over time she is having more problems with gait and balance difficulty.  Several weeks ago she was bringing trash cans and fell down on the road.  Following this she has had left head and neck pain, confusion and memory loss.  She went to the ER for evaluation and had CT of the head which showed no acute findings.  She is continue to have significant postconcussion syndrome.     REVIEW OF SYSTEMS: Full 14 system review of systems performed and negative with exception of: As per HPI.  ALLERGIES: Allergies  Allergen Reactions  . Aspirin Nausea And Vomiting  . Codeine Nausea And Vomiting  . Ibuprofen Nausea And Vomiting  . Meperidine Hcl   . Augmentin [Amoxicillin-Pot Clavulanate]     GI upset    HOME MEDICATIONS: Outpatient Medications Prior to Visit  Medication Sig Dispense Refill  . albuterol (PROAIR HFA) 108 (90 Base) MCG/ACT inhaler 2 puffs every 4-6 hours as directed- rescue 1 Inhaler prn  . albuterol (PROVENTIL) (2.5 MG/3ML) 0.083% nebulizer solution Take 3 mLs (2.5 mg total) by nebulization every 6 (six) hours as needed for wheezing or shortness of breath. DX J45.998 60 vial 12  . BD PEN NEEDLE NANO U/F 32G X 4 MM MISC USE TO INJECT INSULIN ONCE D    . Blood Glucose Monitoring Suppl (ONE TOUCH ULTRA SYSTEM KIT) W/DEVICE KIT 1 kit by Does not apply route  once.    . Cholecalciferol (VITAMIN D3) 3000 UNITS TABS Take 1 capsule by mouth daily.    . cloNIDine (CATAPRES) 0.1 MG tablet Take 0.1 mg by mouth 3 (three) times daily.     . diazepam (VALIUM) 5 MG tablet Take 5 mg by mouth 2 (two) times daily.    . diclofenac sodium (VOLTAREN) 1 % GEL Apply 2 g topically 4 (four) times daily as needed.     . doxycycline (VIBRA-TABS) 100 MG tablet Take 1 tablet (100 mg total) by mouth 2 (two) times daily. 20 tablet 0  . ezetimibe-simvastatin (VYTORIN) 10-40 MG per tablet Take 1 tablet by mouth at bedtime.    . furosemide (LASIX) 40 MG tablet Take 40 mg by mouth daily.    . Lancets MISC by Does not apply route daily.    Marland Kitchen linagliptin (TRADJENTA) 5 MG TABS tablet Take 5 mg by mouth daily.    Marland Kitchen Lysine 500 MG TABS Take 1 tablet by mouth daily.     . metoprolol (LOPRESSOR) 50 MG tablet Take 1 tablet by mouth 2 (two) times daily.    . Multiple Vitamins-Minerals (PRESERVISION AREDS 2 PO) Take 1 tablet by mouth 2 (two) times daily.    . multivitamin-lutein (OCUVITE-LUTEIN) CAPS Take 1 capsule by mouth daily.    . NEOMYCIN-POLYMYXIN-HYDROCORTISONE (CORTISPORIN) 1 % SOLN OTIC solution Apply 1-2 drops to toe BID after soaking  10 mL 1  . omeprazole (PRILOSEC) 20 MG capsule Take 20 mg by mouth daily.    . ONE TOUCH ULTRA TEST test strip   8  . simvastatin (ZOCOR) 40 MG tablet Take 40 mg by mouth daily at 6 PM.     . telmisartan (MICARDIS) 80 MG tablet Take 1 tablet by mouth daily.    [name] SOLOSTAR 300 UNIT/ML SOPN Inject 25 Units into the skin daily.     . traMADol (ULTRAM) 50 MG tablet Take 1 tablet (50 mg total) by mouth every 6 (six) hours as needed. 10 tablet 0  . vitamin B-12 (CYANOCOBALAMIN) 1000 MCG tablet Take 1,000 mcg by mouth daily.    Marland Kitchen warfarin (COUMADIN) 6 MG tablet M/F add extra 48m to make 983m    No facility-administered medications prior to visit.    PAST MEDICAL HISTORY: Past Medical History:  Diagnosis Date  . Allergic rhinitis, cause  unspecified   . Asthma   . Esophageal reflux   . Sleep apnea    on C-pap- Dr young follows  . Syncope 2019   Normal LVF, negative monitor  . Type II or unspecified type diabetes mellitus without mention of complication, not stated as uncontrolled   . Unspecified essential hypertension    mild LVH, normal LVF, grade 2 DD by echo 2019    PAST SURGICAL HISTORY: Past Surgical History:  Procedure Laterality Date  . APPENDECTOMY    . FIXATION KYPHOPLASTY LUMBAR SPINE    . HIATAL HERNIA REPAIR    . LUMBAR SPINE SURGERY    . TUBAL LIGATION    . VAGINAL HYSTERECTOMY      FAMILY HISTORY: Family History  Problem Relation Age of Onset  . Lung cancer Brother   . Diabetes Mother     SOCIAL HISTORY: Social History   Socioeconomic History  . Marital status: Married    Spouse name: Not on file  . Number of children: 4  . Years of education: Not on file  . Highest education level: 10th grade  Occupational History    Comment: [employer]  Tobacco Use  . Smoking status: Never Smoker  . Smokeless tobacco: Never Used  Vaping Use  . Vaping Use: Never used  Substance and Sexual Activity  . Alcohol use: Never  . Drug use: Never  . Sexual activity: Not Currently  Other Topics Concern  . Not on file  Social History Narrative   06/29/20 lives with husband who had 2 strokes, caregiver 3 hours every day of week in mornings   Social Determinants of Health   Financial Resource Strain: Not on file  Food Insecurity: Not on file  Transportation Needs: Not on file  Physical Activity: Not on file  Stress: Not on file  Social Connections: Not on file  Intimate Partner Violence: Not on file     PHYSICAL EXAM  GENERAL EXAM/CONSTITUTIONAL: Vitals:  Vitals:   06/29/20 0948  BP: (!) 129/54  Pulse: (!) 51  Weight: 183 lb 9.6 oz (83.3 kg)  Height: '5\' 5"'  (1.651 m)     Body mass index is 30.55 kg/m. Wt Readings from Last 3 Encounters:  06/29/20 183 lb 9.6 oz (83.3 kg)  06/24/20 191 lb  12.8 oz (87 kg)  01/11/20 191 lb 12.8 oz (87 kg)     Patient is in no distress; well developed, nourished and groomed; neck is supple  CARDIOVASCULAR:  Examination of carotid arteries is normal; no carotid bruits  Regular rate and rhythm,  no murmurs  Examination of peripheral vascular system by observation and palpation is normal  EYES:  Ophthalmoscopic exam of optic discs and posterior segments is normal; no papilledema or hemorrhages  No exam data present  MUSCULOSKELETAL:  Gait, strength, tone, movements noted in Neurologic exam below  NEUROLOGIC: MENTAL STATUS:  MMSE - Mini Mental State Exam 06/29/2020  Orientation to time 3  Orientation to Place 4  Registration 3  Attention/ Calculation 1  Recall 3  Language- name 2 objects 2  Language- repeat 1  Language- follow 3 step command 3  Language- read & follow direction 1  Write a sentence 1  Copy design 1  Total score 23    awake, alert, oriented to person, place and time  recent and remote memory intact  normal attention and concentration  language fluent, comprehension intact, naming intact  fund of knowledge appropriate  CRANIAL NERVE:   2nd - no papilledema on fundoscopic exam  2nd, 3rd, 4th, 6th - pupils equal and reactive to light, visual fields full to confrontation, extraocular muscles intact, no nystagmus  5th - facial sensation symmetric  7th - facial strength symmetric  8th - hearing intact  9th - palate elevates symmetrically, uvula midline  11th - shoulder shrug symmetric  12th - tongue protrusion midline  MOTOR:   POSTURAL / ACTION >> resting TREMOR IN BUE   TREMOR IN VOICE AND MOUTH  normal bulk and tone  BUE 4  BLE 3-4  SENSORY:   normal and symmetric to light touch, temperature, vibration  COORDINATION:   finger-nose-finger, fine finger movements normal  REFLEXES:   deep tendon reflexes TRACE and symmetric  GAIT/STATION:   USING  ROLLATOR     DIAGNOSTIC DATA (LABS, IMAGING, TESTING) - I reviewed patient records, labs, notes, testing and imaging myself where available.  Lab Results  Component Value Date   WBC 6.2 06/24/2020   HGB 12.4 06/24/2020   HCT 39.1 06/24/2020   MCV 93.3 06/24/2020   PLT 177 06/24/2020      Component Value Date/Time   NA 144 06/24/2020 1338   K 4.3 06/24/2020 1338   CL 110 06/24/2020 1338   CO2 26 06/24/2020 1338   GLUCOSE 123 (H) 06/24/2020 1338   BUN 66 (H) 06/24/2020 1338   CREATININE 2.74 (H) 06/24/2020 1338   CALCIUM 10.7 (H) 06/24/2020 1338   PROT 8.0 06/24/2020 1338   ALBUMIN 4.5 06/24/2020 1338   AST 27 06/24/2020 1338   ALT 28 06/24/2020 1338   ALKPHOS 68 06/24/2020 1338   BILITOT 0.6 06/24/2020 1338   GFRNONAA 16 (L) 06/24/2020 1338   GFRAA 23 (L) 07/07/2017 2204   No results found for: CHOL, HDL, LDLCALC, LDLDIRECT, TRIG, CHOLHDL No results found for: HGBA1C No results found for: VITAMINB12 No results found for: TSH   05/31/10 MRI brain [I reviewed images myself and agree with interpretation. -VRP]  1. 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  2. Mild atrophy and white matter disease. This is nonspecific,  but likely reflects the sequelae of chronic microvascular ischemia.  3. Focal area of remote ischemia involving the posterior right  frontal lobe.   06/24/20 CT head 1. No acute intracranial abnormalities.  No skull fracture.   06/24/20 CT cervical  1. No fracture or acute finding.    ASSESSMENT AND PLAN  87 y.o. year old female here with:  Dx:  1. Mild  cognitive impairment   2. Gait difficulty   3. Essential tremor      PLAN:  CONFUSION (post-concussion syndrome) - may have had underlying cognitive issues for several years (mild cognitive impairment + sleep apnea) - continue supportive care - safety / supervision issues reviewed - daily physical  activity / exercise (at least 15-30 minutes) - eat more plants / vegetables - increase social activities, brain stimulation, games, puzzles, hobbies, crafts, arts, music - aim for at least 7-8 hours sleep per night (or more) - avoid smoking and alcohol - caregiver resources provided - caution with medications, finances; no driving  POSTURAL TREMOR - likely essential tremor; monitor; on current polypharmacy and therefore caution with primidone   Return for return to PCP, pending if symptoms worsen or fail to improve.    Penni Bombard, MD 5/84/4652, 07:61 AM Certified in Neurology, Neurophysiology and Neuroimaging  [facility] [address], [place], [place] [address] [phone]

## 2020-08-02 ENCOUNTER — Ambulatory Visit: Payer: Medicare Other | Admitting: Cardiovascular Disease

## 2020-08-04 ENCOUNTER — Encounter: Payer: Self-pay | Admitting: Cardiovascular Disease

## 2020-08-04 ENCOUNTER — Ambulatory Visit: Payer: Medicare Other | Admitting: Cardiovascular Disease

## 2020-08-04 ENCOUNTER — Other Ambulatory Visit: Payer: Self-pay

## 2020-08-04 VITALS — BP 147/57 | HR 56 | Wt 184.4 lb

## 2020-08-04 DIAGNOSIS — Z87898 Personal history of other specified conditions: Secondary | ICD-10-CM | POA: Diagnosis not present

## 2020-08-04 DIAGNOSIS — I10 Essential (primary) hypertension: Secondary | ICD-10-CM | POA: Diagnosis not present

## 2020-08-04 DIAGNOSIS — E782 Mixed hyperlipidemia: Secondary | ICD-10-CM

## 2020-08-04 NOTE — Patient Instructions (Signed)

## 2020-08-04 NOTE — Assessment & Plan Note (Signed)
History of hyperlipidemia on statin therapy and Zetia with lipid profile performed 06/30/2019 revealing total cholesterol 146, LDL 85 and HDL 44.

## 2020-08-04 NOTE — Progress Notes (Signed)
08/04/2020 [name] [name]   January 04, 1930  [id number]  Primary Physician Brigitte Pulse [name] [name]., MD Primary Cardiologist: [name] Harp MD [name] [name], [place]  HPI:  [name] [name] is a 87 y.o.  moderately overweight married Caucasian female mother of 36, grandmother of 4 grandchildren is accompanied by [name] who is a retired Statistician.  She was referred by Dr. Lang Snow for evaluation of syncope.  I last saw her in the office 08/28/2017. Her cardiac risk factors include treated hypertension, diabetes and hyperlipidemia.  Her mother did die of a second myocardial infarction.  She is never had a heart attack or stroke.  She denies chest pain or shortness of breath.  She does take care of her husband has had 2 strokes in the past.  She had 2 episodes of syncope, one in mid April and one on April 28 which brought her to the emergency room.  Her work-up was unremarkable.  Subsequent carotid Doppler showed no evidence of ICA stenosis.  I did get a 2D echo at that time which was essentially normal as was an event monitor.  She has seen Dr. Olga [name], neurologist, for confusion, mild cognitive impairment and essential tremor.  Since I saw her 3 years ago she has had 2 episodes of syncope.  She is on warfarin for a thrombophilia and clotting disorder.  She denies chest pain or shortness of breath.   Current Meds  Medication Sig  . albuterol (PROAIR HFA) 108 (90 Base) MCG/ACT inhaler 2 puffs every 4-6 hours as directed- rescue  . albuterol (PROVENTIL) (2.5 MG/3ML) 0.083% nebulizer solution Take 3 mLs (2.5 mg total) by nebulization every 6 (six) hours as needed for wheezing or shortness of breath. DX J45.998  . BD PEN NEEDLE NANO U/F 32G X 4 MM MISC USE TO INJECT INSULIN ONCE D  . Blood Glucose Monitoring Suppl (ONE TOUCH ULTRA SYSTEM KIT) W/DEVICE KIT 1 kit by Does not apply route once.  . cloNIDine (CATAPRES) 0.1 MG tablet Take 0.1 mg by mouth 2 (two) times daily.  .  diazepam (VALIUM) 5 MG tablet Take 5 mg by mouth 2 (two) times daily.  Marland Kitchen ezetimibe-simvastatin (VYTORIN) 10-40 MG per tablet Take 1 tablet by mouth at bedtime.  . furosemide (LASIX) 40 MG tablet Take 40 mg by mouth daily.  . Lancets MISC by Does not apply route daily.  Marland Kitchen linagliptin (TRADJENTA) 5 MG TABS tablet Take 5 mg by mouth daily.  Marland Kitchen Lysine 500 MG TABS Take 1 tablet by mouth daily.   . metoprolol (LOPRESSOR) 50 MG tablet Take 1 tablet by mouth 2 (two) times daily.  . NEOMYCIN-POLYMYXIN-HYDROCORTISONE (CORTISPORIN) 1 % SOLN OTIC solution Apply 1-2 drops to toe BID after soaking  . omeprazole (PRILOSEC) 20 MG capsule Take 20 mg by mouth daily.  . ONE TOUCH ULTRA TEST test strip   . simvastatin (ZOCOR) 40 MG tablet Take 40 mg by mouth daily at 6 PM.   . telmisartan (MICARDIS) 80 MG tablet Take 1 tablet by mouth daily.  [name] SOLOSTAR 300 UNIT/ML SOPN Inject 10 Units into the skin daily.  . traMADol (ULTRAM) 50 MG tablet Take 1 tablet (50 mg total) by mouth every 6 (six) hours as needed.  . warfarin (COUMADIN) 6 MG tablet M/F add extra 57m to make 93m    Allergies  Allergen Reactions  . Aspirin Nausea And Vomiting  . Codeine Nausea And Vomiting  . Ibuprofen Nausea And Vomiting  .  Meperidine Hcl   . Augmentin [Amoxicillin-Pot Clavulanate]     GI upset    Social History   Socioeconomic History  . Marital status: Married    Spouse name: Not on file  . Number of children: 4  . Years of education: Not on file  . Highest education level: 10th grade  Occupational History    Comment: [employer]  Tobacco Use  . Smoking status: Never Smoker  . Smokeless tobacco: Never Used  Vaping Use  . Vaping Use: Never used  Substance and Sexual Activity  . Alcohol use: Never  . Drug use: Never  . Sexual activity: Not Currently  Other Topics Concern  . Not on file  Social History Narrative   06/29/20 lives with husband who had 2 strokes, caregiver 3 hours every day of week in mornings    Social Determinants of Health   Financial Resource Strain: Not on file  Food Insecurity: Not on file  Transportation Needs: Not on file  Physical Activity: Not on file  Stress: Not on file  Social Connections: Not on file  Intimate Partner Violence: Not on file     Review of Systems: General: negative for chills, fever, night sweats or weight changes.  Cardiovascular: negative for chest pain, dyspnea on exertion, edema, orthopnea, palpitations, paroxysmal nocturnal dyspnea or shortness of breath Dermatological: negative for rash Respiratory: negative for cough or wheezing Urologic: negative for hematuria Abdominal: negative for nausea, vomiting, diarrhea, bright red blood per rectum, melena, or hematemesis Neurologic: negative for visual changes, syncope, or dizziness All other systems reviewed and are otherwise negative except as noted above.    Blood pressure (!) 147/57, pulse (!) 56, weight 184 lb 6.4 oz (83.6 kg).  General appearance: alert and no distress Neck: no adenopathy, no carotid bruit, no JVD, supple, symmetrical, trachea midline and thyroid not enlarged, symmetric, no tenderness/mass/nodules Lungs: clear to auscultation bilaterally Heart: regular rate and rhythm, S1, S2 normal, no murmur, click, rub or gallop Extremities: extremities normal, atraumatic, no cyanosis or edema Pulses: 2+ and symmetric Skin: Skin color, texture, turgor normal. No rashes or lesions Neurologic: Alert and oriented X 3, normal strength and tone. Normal symmetric reflexes. Normal coordination and gait  EKG sinus bradycardia 56 with poor R wave progression and early repolarization pattern.  I personally reviewed this EKG.  ASSESSMENT AND PLAN:   History of syncope History of syncope in the past with a recent episode in the last several months.  She fell onto a trash can.  I did evaluate her for syncope several years ago.  2D echo was normal.  Event monitor did not show any significant  arrhythmias.  We talked about loop recorder however the patient and daughter are not sure they want to pursue pacemaker in the event there is a rhythm abnormality.  I will see her back in 1 year.  Hyperlipidemia History of hyperlipidemia on statin therapy and Zetia with lipid profile performed 06/30/2019 revealing total cholesterol 146, LDL 85 and HDL 44.  Essential hypertension History of essential hypertension a blood pressure measured today 147/57.  She is on metoprolol and clonidine.       J.  MD FACP,FACC,FAHA, FSCAI 08/04/2020 11:38 AM 

## 2020-08-04 NOTE — Assessment & Plan Note (Signed)
History of syncope in the past with a recent episode in the last several months.  She fell onto a trash can.  I did evaluate her for syncope several years ago.  2D echo was normal.  Event monitor did not show any significant arrhythmias.  We talked about loop recorder however the patient and daughter are not sure they want to pursue pacemaker in the event there is a rhythm abnormality.  I will see her back in 1 year.

## 2020-08-04 NOTE — Assessment & Plan Note (Signed)
History of essential hypertension a blood pressure measured today 147/57.  She is on metoprolol and clonidine.

## 2021-01-07 ENCOUNTER — Encounter (HOSPITAL_COMMUNITY): Payer: Self-pay

## 2021-01-07 ENCOUNTER — Emergency Department (HOSPITAL_COMMUNITY): Payer: Medicare Other

## 2021-01-07 ENCOUNTER — Emergency Department (HOSPITAL_COMMUNITY)
Admission: EM | Admit: 2021-01-07 | Discharge: 2021-01-07 | Disposition: A | Discharge status: Home / Self Care | Payer: Medicare Other | Attending: Emergency Medicine | Admitting: Emergency Medicine

## 2021-01-07 ENCOUNTER — Other Ambulatory Visit: Payer: Self-pay

## 2021-01-07 DIAGNOSIS — J4530 Mild persistent asthma, uncomplicated: Secondary | ICD-10-CM | POA: Diagnosis not present

## 2021-01-07 DIAGNOSIS — E119 Type 2 diabetes mellitus without complications: Secondary | ICD-10-CM | POA: Diagnosis not present

## 2021-01-07 DIAGNOSIS — Z7901 Long term (current) use of anticoagulants: Secondary | ICD-10-CM | POA: Diagnosis not present

## 2021-01-07 DIAGNOSIS — R051 Acute cough: Secondary | ICD-10-CM

## 2021-01-07 DIAGNOSIS — I10 Essential (primary) hypertension: Secondary | ICD-10-CM | POA: Insufficient documentation

## 2021-01-07 DIAGNOSIS — Z79899 Other long term (current) drug therapy: Secondary | ICD-10-CM | POA: Diagnosis not present

## 2021-01-07 DIAGNOSIS — Z20822 Contact with and (suspected) exposure to covid-19: Secondary | ICD-10-CM | POA: Diagnosis not present

## 2021-01-07 DIAGNOSIS — Z7984 Long term (current) use of oral hypoglycemic drugs: Secondary | ICD-10-CM | POA: Diagnosis not present

## 2021-01-07 LAB — CBC
HCT: 36 % (ref 36.0–46.0)
Hemoglobin: 11.4 g/dL — ABNORMAL LOW (ref 12.0–15.0)
MCH: 30.3 pg (ref 26.0–34.0)
MCHC: 31.7 g/dL (ref 30.0–36.0)
MCV: 95.7 fL (ref 80.0–100.0)
Platelets: 83 10*3/uL — ABNORMAL LOW (ref 150–400)
RBC: 3.76 MIL/uL — ABNORMAL LOW (ref 3.87–5.11)
RDW: 13.2 % (ref 11.5–15.5)
WBC: 3.3 10*3/uL — ABNORMAL LOW (ref 4.0–10.5)
nRBC: 0 % (ref 0.0–0.2)

## 2021-01-07 LAB — BASIC METABOLIC PANEL
Anion gap: 6 (ref 5–15)
BUN: 33 mg/dL — ABNORMAL HIGH (ref 8–23)
CO2: 20 mmol/L — ABNORMAL LOW (ref 22–32)
Calcium: 9.7 mg/dL (ref 8.9–10.3)
Chloride: 110 mmol/L (ref 98–111)
Creatinine, Ser: 2.12 mg/dL — ABNORMAL HIGH (ref 0.44–1.00)
GFR, Estimated: 22 mL/min — ABNORMAL LOW (ref >60)
Glucose, Bld: 245 mg/dL — ABNORMAL HIGH (ref 70–99)
Potassium: 5 mmol/L (ref 3.5–5.1)
Sodium: 136 mmol/L (ref 135–145)

## 2021-01-07 LAB — RESP PANEL BY RT-PCR (FLU A&B, COVID) ARPGX2
Influenza A by PCR: NEGATIVE
Influenza B by PCR: NEGATIVE
SARS Coronavirus 2 by RT PCR: NEGATIVE

## 2021-01-07 LAB — PROTIME-INR
INR: 1.4 — ABNORMAL HIGH (ref 0.8–1.2)
Prothrombin Time: 17.4 seconds — ABNORMAL HIGH (ref 11.4–15.2)

## 2021-01-07 LAB — CBG MONITORING, ED: Glucose-Capillary: 191 mg/dL — ABNORMAL HIGH (ref 70–99)

## 2021-01-07 MED ORDER — HYDROCOD POLST-CPM POLST ER 10-8 MG/5ML PO SUER: 5.0000 mL | Freq: Two times a day (BID) | ORAL | 0 refills | Status: DC | PRN

## 2021-01-07 MED ORDER — CEFDINIR 300 MG PO CAPS: 300.0000 mg | ORAL_CAPSULE | Freq: Two times a day (BID) | ORAL | 0 refills | Status: DC

## 2021-01-07 MED ORDER — BENZONATATE 100 MG PO CAPS
200.0000 mg | ORAL_CAPSULE | Freq: Once | ORAL | Status: AC
  Administered 2021-01-07: 200 mg via ORAL
  Filled 2021-01-07: qty 2

## 2021-01-07 NOTE — ED Triage Notes (Addendum)
Patient arrived by [facility] from home with complaint of cough and decreased appetite. Complaining of general weakness and possible syncopal event versus mechanical fall. Patient hit bilateral arms on dresser. Did not hit head, no loc CBG 222

## 2021-01-07 NOTE — ED Provider Notes (Signed)
Emergency Medicine Provider Triage Evaluation Note  [name] [name] , a 87 y.o. female  was evaluated in triage.  Pt complains of cough, lightheadedness, possible syncopal episode. Did no hit head, no LOC, she does report hitting her arms on the dresser. No sleep in 2 days. No fever, chills, nausea, vomiting, CP, SOB.  Review of Systems  Positive: As above Negative: As above  Physical Exam  BP 114/60 (BP Location: Left Arm)   Pulse 68   Temp 98.3 F (36.8 C) (Oral)   Resp 18   SpO2 100%  Gen:   Awake, no distress   Resp:  Normal effort  MSK:   Moves extremities without difficulty, no stepoffs or deformity bil arms Other:    Medical Decision Making  Medically screening exam initiated at 12:29 PM.  Appropriate orders placed.  NEJLA REASOR was informed that the remainder of the evaluation will be completed by another provider, this initial triage assessment does not replace that evaluation, and the importance of remaining in the ED until their evaluation is complete.  Weakness, syncope, possible fall   Anselmo Pickler, PA-C 01/07/21 1231    Tegeler, Gwenyth Allegra, MD 01/07/21 1314

## 2021-01-07 NOTE — ED Provider Notes (Signed)
[facility] Provider Note   CSN: [id number] Arrival date & time: 01/07/21  1156     History No chief complaint on file.   [name] [name] is a 87 y.o. female.  87 year old female presents with cough times several days.  No reported fever or chills.  Cough is been nonproductive.  Patient recently completed antibiotics for UTI.  Denies any vomiting or diarrhea.  No abdominal or chest discomfort.  Has had some other URI symptoms.  No treatment use prior to arrival      Past Medical History:  Diagnosis Date   Allergic rhinitis, cause unspecified    Asthma    Esophageal reflux    Sleep apnea    on C-pap- Dr young follows   Syncope 2019   Normal LVF, negative monitor   Type II or unspecified type diabetes mellitus without mention of complication, not stated as uncontrolled    Unspecified essential hypertension    mild LVH, normal LVF, grade 2 DD by echo 2019    Patient Active Problem List   Diagnosis Date Noted   Renal insufficiency 06/18/2019   Sleep apnea    Essential hypertension    Hyperlipidemia 08/28/2017   History of syncope 08/28/2017   Acute frontal sinusitis 09/20/2013   Obstructive sleep apnea 02/08/2011   RENAL INSUFFICIENCY 09/29/2007   DIABETES, TYPE 2 03/31/2007   OBESITY 03/31/2007   Venous insufficiency (chronic) (peripheral) 03/31/2007   Asthmatic bronchitis , chronic (HCC) 03/31/2007   Seasonal and perennial allergic rhinitis 03/31/2007   Asthma, mild persistent 03/31/2007   G E R D 03/31/2007    Past Surgical History:  Procedure Laterality Date   APPENDECTOMY     FIXATION KYPHOPLASTY LUMBAR SPINE     HIATAL HERNIA REPAIR     LUMBAR SPINE SURGERY     TUBAL LIGATION     VAGINAL HYSTERECTOMY       OB History   No obstetric history on file.     Family History  Problem Relation Age of Onset   Lung cancer Brother    Diabetes Mother     Social History   Tobacco Use   Smoking status: Never    Smokeless tobacco: Never  Vaping Use   Vaping Use: Never used  Substance Use Topics   Alcohol use: Never   Drug use: Never    Home Medications Prior to Admission medications   Medication Sig Start Date End Date Taking? Authorizing Provider  albuterol (PROAIR HFA) 108 (90 Base) MCG/ACT inhaler 2 puffs every 4-6 hours as directed- rescue 06/27/18   Baird Lyons D, MD  albuterol (PROVENTIL) (2.5 MG/3ML) 0.083% nebulizer solution Take 3 mLs (2.5 mg total) by nebulization every 6 (six) hours as needed for wheezing or shortness of breath. DX N82.956 06/27/18   Deneise Lever, MD  BD PEN NEEDLE NANO U/F 32G X 4 MM MISC USE TO INJECT INSULIN ONCE D 01/16/18   [provider]  Blood Glucose Monitoring Suppl (ONE TOUCH ULTRA SYSTEM KIT) W/DEVICE KIT 1 kit by Does not apply route once.    [provider]  cloNIDine (CATAPRES) 0.1 MG tablet Take 0.1 mg by mouth 2 (two) times daily.    [provider]  diazepam (VALIUM) 5 MG tablet Take 5 mg by mouth 2 (two) times daily.    [provider]  ezetimibe-simvastatin (VYTORIN) 10-40 MG per tablet Take 1 tablet by mouth at bedtime.    [provider]  furosemide (LASIX)  40 MG tablet Take 40 mg by mouth daily.    [provider]  Lancets MISC by Does not apply route daily.    [provider]  linagliptin (TRADJENTA) 5 MG TABS tablet Take 5 mg by mouth daily.    [provider]  Lysine 500 MG TABS Take 1 tablet by mouth daily.     [provider]  metoprolol (LOPRESSOR) 50 MG tablet Take 1 tablet by mouth 2 (two) times daily. 01/29/13   [provider]  NEOMYCIN-POLYMYXIN-HYDROCORTISONE (CORTISPORIN) 1 % SOLN OTIC solution Apply 1-2 drops to toe BID after soaking 04/03/18   Hyatt, Max T, DPM  omeprazole (PRILOSEC) 20 MG capsule Take 20 mg by mouth daily.    [provider]  ONE TOUCH ULTRA TEST test strip  06/21/14   [provider]  simvastatin (ZOCOR) 40  MG tablet Take 40 mg by mouth daily at 6 PM.  01/15/18   [provider]  telmisartan (MICARDIS) 80 MG tablet Take 1 tablet by mouth daily. 07/18/13   [provider]  TOUJEO SOLOSTAR 300 UNIT/ML SOPN Inject 10 Units into the skin daily. 11/17/17   [provider]  traMADol (ULTRAM) 50 MG tablet Take 1 tablet (50 mg total) by mouth every 6 (six) hours as needed. 07/08/17   Montine Circle, PA-C  warfarin (COUMADIN) 6 MG tablet M/F add extra 21m to make 926m1/28/20   [provider]    Allergies    Aspirin, Codeine, Ibuprofen, Meperidine hcl, and Augmentin [amoxicillin-pot clavulanate]  Review of Systems   Review of Systems  All other systems reviewed and are negative.  Physical Exam Updated Vital Signs BP 114/60 (BP Location: Left Arm)   Pulse 68   Temp 98.3 F (36.8 C) (Oral)   Resp 18   SpO2 100%   Physical Exam Vitals and nursing note reviewed.  Constitutional:      General: She is not in acute distress.    Appearance: Normal appearance. She is well-developed. She is not toxic-appearing.  HENT:     Head: Normocephalic and atraumatic.  Eyes:     General: Lids are normal.     Conjunctiva/sclera: Conjunctivae normal.     Pupils: Pupils are equal, round, and reactive to light.  Neck:     Thyroid: No thyroid mass.     Trachea: No tracheal deviation.  Cardiovascular:     Rate and Rhythm: Normal rate and regular rhythm.     Heart sounds: Normal heart sounds. No murmur heard.   No gallop.  Pulmonary:     Effort: Pulmonary effort is normal. No respiratory distress.     Breath sounds: Normal breath sounds. No stridor. No decreased breath sounds, wheezing, rhonchi or rales.  Abdominal:     General: There is no distension.     Palpations: Abdomen is soft.     Tenderness: There is no abdominal tenderness. There is no rebound.  Musculoskeletal:        General: No tenderness. Normal range of motion.     Cervical back: Normal range of motion and  neck supple.  Skin:    General: Skin is warm and dry.     Findings: No abrasion or rash.  Neurological:     Mental Status: She is alert and oriented to person, place, and time. Mental status is at baseline.     GCS: GCS eye subscore is 4. GCS verbal subscore is 5. GCS motor subscore is 6.  Cranial Nerves: No cranial nerve deficit.     Sensory: No sensory deficit.     Motor: Motor function is intact.  Psychiatric:        Attention and Perception: Attention normal.        Speech: Speech normal.        Behavior: Behavior normal.    ED Results / Procedures / Treatments   Labs (all labs ordered are listed, but only abnormal results are displayed) Labs Reviewed  BASIC METABOLIC PANEL - Abnormal; Notable for the following components:      Result Value   CO2 20 (*)    Glucose, Bld 245 (*)    BUN 33 (*)    Creatinine, Ser 2.12 (*)    GFR, Estimated 22 (*)    All other components within normal limits  CBC - Abnormal; Notable for the following components:   WBC 3.3 (*)    RBC 3.76 (*)    Hemoglobin 11.4 (*)    Platelets 83 (*)    All other components within normal limits  CBG MONITORING, ED - Abnormal; Notable for the following components:   Glucose-Capillary 191 (*)    All other components within normal limits  RESP PANEL BY RT-PCR (FLU A&B, COVID) [id number]  URINALYSIS, ROUTINE W REFLEX MICROSCOPIC  PROTIME-INR    EKG EKG Interpretation  Date/Time:  Saturday January 07 2021 12:25:28 EDT Ventricular Rate:  68 PR Interval:  214 QRS Duration: 76 QT Interval:  414 QTC Calculation: 440 R Axis:   7 Text Interpretation: Sinus rhythm with 1st degree A-V block Otherwise normal ECG No significant change since last tracing Confirmed by Lacretia Leigh ([id number]) on 01/07/2021 6:14:11 PM  Radiology DG Chest 2 View  Result Date: 01/07/2021 CLINICAL DATA:  Cough. EXAM: CHEST - 2 VIEW COMPARISON:  06/24/2020 FINDINGS: The cardiac silhouette, mediastinal and hilar contours are normal.  Minimal streaky basilar scarring changes but no infiltrates, edema or effusions. No pulmonary lesions. The bony thorax is intact. IMPRESSION: Minimal basilar scarring changes but no infiltrates, edema or effusions. Electronically Signed   By: Marijo Sanes M.D.   On: 01/07/2021 13:11    Procedures Procedures   Medications Ordered in ED Medications  benzonatate (TESSALON) capsule 200 mg (has no administration in time range)    ED Course  I have reviewed the triage vital signs and the nursing notes.  Pertinent labs & imaging results that were available during my care of the patient were reviewed by me and considered in my medical decision making (see chart for details).    MDM Rules/Calculators/A&P                           Chest x-ray without evidence of pneumonia here.  She is COVID and flu negative here.  Suspect URI.  Her other labs are without significant abnormality.  She has no urinary symptoms.  Just finished being treated for UTI.  Patient given Ladona Ridgel with minimal relief.  Will prescribe Tussionex and also doxycycline if her symptoms worsen.  Patient and daughter are comfortable with this Final Clinical Impression(s) / ED Diagnoses Final diagnoses:  None    Rx / DC Orders ED Discharge Orders     None        Lacretia Leigh, MD 01/07/21 1958

## 2021-01-20 ENCOUNTER — Emergency Department (HOSPITAL_COMMUNITY): Payer: Medicare Other

## 2021-01-20 ENCOUNTER — Inpatient Hospital Stay (HOSPITAL_COMMUNITY)
Admission: EM | Admit: 2021-01-20 | Discharge: 2021-02-01 | DRG: 481 | Disposition: A | Discharge status: Skilled Nursing Facility | Payer: Medicare Other | Attending: Internal Medicine | Admitting: Internal Medicine

## 2021-01-20 ENCOUNTER — Other Ambulatory Visit: Payer: Self-pay

## 2021-01-20 ENCOUNTER — Encounter (HOSPITAL_COMMUNITY): Payer: Self-pay | Admitting: Emergency Medicine

## 2021-01-20 DIAGNOSIS — S72002A Fracture of unspecified part of neck of left femur, initial encounter for closed fracture: Secondary | ICD-10-CM | POA: Diagnosis not present

## 2021-01-20 DIAGNOSIS — Z7901 Long term (current) use of anticoagulants: Secondary | ICD-10-CM | POA: Diagnosis not present

## 2021-01-20 DIAGNOSIS — I959 Hypotension, unspecified: Secondary | ICD-10-CM | POA: Diagnosis present

## 2021-01-20 DIAGNOSIS — E119 Type 2 diabetes mellitus without complications: Secondary | ICD-10-CM

## 2021-01-20 DIAGNOSIS — W19XXXA Unspecified fall, initial encounter: Secondary | ICD-10-CM | POA: Diagnosis not present

## 2021-01-20 DIAGNOSIS — T18128A Food in esophagus causing other injury, initial encounter: Secondary | ICD-10-CM | POA: Diagnosis not present

## 2021-01-20 DIAGNOSIS — Z9851 Tubal ligation status: Secondary | ICD-10-CM

## 2021-01-20 DIAGNOSIS — R112 Nausea with vomiting, unspecified: Secondary | ICD-10-CM | POA: Diagnosis not present

## 2021-01-20 DIAGNOSIS — F32A Depression, unspecified: Secondary | ICD-10-CM | POA: Diagnosis present

## 2021-01-20 DIAGNOSIS — F02A3 Dementia in other diseases classified elsewhere, mild, with mood disturbance: Secondary | ICD-10-CM | POA: Diagnosis present

## 2021-01-20 DIAGNOSIS — D62 Acute posthemorrhagic anemia: Secondary | ICD-10-CM | POA: Diagnosis not present

## 2021-01-20 DIAGNOSIS — T18128D Food in esophagus causing other injury, subsequent encounter: Secondary | ICD-10-CM | POA: Diagnosis not present

## 2021-01-20 DIAGNOSIS — I272 Pulmonary hypertension, unspecified: Secondary | ICD-10-CM | POA: Diagnosis present

## 2021-01-20 DIAGNOSIS — E1169 Type 2 diabetes mellitus with other specified complication: Secondary | ICD-10-CM

## 2021-01-20 DIAGNOSIS — Z86718 Personal history of other venous thrombosis and embolism: Secondary | ICD-10-CM

## 2021-01-20 DIAGNOSIS — N184 Chronic kidney disease, stage 4 (severe): Secondary | ICD-10-CM | POA: Diagnosis present

## 2021-01-20 DIAGNOSIS — I10 Essential (primary) hypertension: Secondary | ICD-10-CM | POA: Diagnosis not present

## 2021-01-20 DIAGNOSIS — F0280 Dementia in other diseases classified elsewhere without behavioral disturbance: Secondary | ICD-10-CM

## 2021-01-20 DIAGNOSIS — R1084 Generalized abdominal pain: Secondary | ICD-10-CM | POA: Diagnosis not present

## 2021-01-20 DIAGNOSIS — G309 Alzheimer's disease, unspecified: Secondary | ICD-10-CM | POA: Diagnosis present

## 2021-01-20 DIAGNOSIS — K219 Gastro-esophageal reflux disease without esophagitis: Secondary | ICD-10-CM | POA: Diagnosis present

## 2021-01-20 DIAGNOSIS — Z833 Family history of diabetes mellitus: Secondary | ICD-10-CM | POA: Diagnosis not present

## 2021-01-20 DIAGNOSIS — Z419 Encounter for procedure for purposes other than remedying health state, unspecified: Secondary | ICD-10-CM

## 2021-01-20 DIAGNOSIS — G4730 Sleep apnea, unspecified: Secondary | ICD-10-CM | POA: Diagnosis present

## 2021-01-20 DIAGNOSIS — Z9049 Acquired absence of other specified parts of digestive tract: Secondary | ICD-10-CM

## 2021-01-20 DIAGNOSIS — S72142A Displaced intertrochanteric fracture of left femur, initial encounter for closed fracture: Principal | ICD-10-CM | POA: Diagnosis present

## 2021-01-20 DIAGNOSIS — Z20822 Contact with and (suspected) exposure to covid-19: Secondary | ICD-10-CM | POA: Diagnosis present

## 2021-01-20 DIAGNOSIS — I951 Orthostatic hypotension: Secondary | ICD-10-CM | POA: Diagnosis not present

## 2021-01-20 DIAGNOSIS — N179 Acute kidney failure, unspecified: Secondary | ICD-10-CM | POA: Diagnosis present

## 2021-01-20 DIAGNOSIS — R1110 Vomiting, unspecified: Secondary | ICD-10-CM

## 2021-01-20 DIAGNOSIS — W1830XA Fall on same level, unspecified, initial encounter: Secondary | ICD-10-CM | POA: Diagnosis present

## 2021-01-20 DIAGNOSIS — F0390 Unspecified dementia without behavioral disturbance: Secondary | ICD-10-CM | POA: Diagnosis present

## 2021-01-20 DIAGNOSIS — Z888 Allergy status to other drugs, medicaments and biological substances status: Secondary | ICD-10-CM

## 2021-01-20 DIAGNOSIS — S72142S Displaced intertrochanteric fracture of left femur, sequela: Secondary | ICD-10-CM | POA: Diagnosis present

## 2021-01-20 DIAGNOSIS — E1122 Type 2 diabetes mellitus with diabetic chronic kidney disease: Secondary | ICD-10-CM | POA: Diagnosis present

## 2021-01-20 DIAGNOSIS — Z794 Long term (current) use of insulin: Secondary | ICD-10-CM

## 2021-01-20 DIAGNOSIS — Z885 Allergy status to narcotic agent status: Secondary | ICD-10-CM

## 2021-01-20 DIAGNOSIS — F02A4 Dementia in other diseases classified elsewhere, mild, with anxiety: Secondary | ICD-10-CM | POA: Diagnosis present

## 2021-01-20 DIAGNOSIS — Z91048 Other nonmedicinal substance allergy status: Secondary | ICD-10-CM

## 2021-01-20 DIAGNOSIS — I5032 Chronic diastolic (congestive) heart failure: Secondary | ICD-10-CM | POA: Diagnosis present

## 2021-01-20 DIAGNOSIS — E785 Hyperlipidemia, unspecified: Secondary | ICD-10-CM | POA: Diagnosis present

## 2021-01-20 DIAGNOSIS — Z886 Allergy status to analgesic agent status: Secondary | ICD-10-CM

## 2021-01-20 DIAGNOSIS — J45909 Unspecified asthma, uncomplicated: Secondary | ICD-10-CM | POA: Diagnosis present

## 2021-01-20 DIAGNOSIS — Z880 Allergy status to penicillin: Secondary | ICD-10-CM

## 2021-01-20 DIAGNOSIS — K2210 Ulcer of esophagus without bleeding: Secondary | ICD-10-CM | POA: Diagnosis present

## 2021-01-20 DIAGNOSIS — W44F3XA Food entering into or through a natural orifice, initial encounter: Secondary | ICD-10-CM | POA: Clinically undetermined

## 2021-01-20 DIAGNOSIS — I130 Hypertensive heart and chronic kidney disease with heart failure and stage 1 through stage 4 chronic kidney disease, or unspecified chronic kidney disease: Secondary | ICD-10-CM | POA: Diagnosis present

## 2021-01-20 DIAGNOSIS — Z90710 Acquired absence of both cervix and uterus: Secondary | ICD-10-CM

## 2021-01-20 DIAGNOSIS — Z79899 Other long term (current) drug therapy: Secondary | ICD-10-CM | POA: Diagnosis not present

## 2021-01-20 HISTORY — DX: Chronic kidney disease, stage 4 (severe): N18.4

## 2021-01-20 LAB — CBC WITH DIFFERENTIAL/PLATELET
Abs Immature Granulocytes: 0.24 10*3/uL — ABNORMAL HIGH (ref 0.00–0.07)
Basophils Absolute: 0 10*3/uL (ref 0.0–0.1)
Basophils Relative: 0 %
Eosinophils Absolute: 0 10*3/uL (ref 0.0–0.5)
Eosinophils Relative: 0 %
HCT: 38.1 % (ref 36.0–46.0)
Hemoglobin: 12.2 g/dL (ref 12.0–15.0)
Immature Granulocytes: 2 %
Lymphocytes Relative: 10 %
Lymphs Abs: 1.4 10*3/uL (ref 0.7–4.0)
MCH: 29.9 pg (ref 26.0–34.0)
MCHC: 32 g/dL (ref 30.0–36.0)
MCV: 93.4 fL (ref 80.0–100.0)
Monocytes Absolute: 0.9 10*3/uL (ref 0.1–1.0)
Monocytes Relative: 7 %
Neutro Abs: 10.8 10*3/uL — ABNORMAL HIGH (ref 1.7–7.7)
Neutrophils Relative %: 81 %
Platelets: 180 10*3/uL (ref 150–400)
RBC: 4.08 MIL/uL (ref 3.87–5.11)
RDW: 13 % (ref 11.5–15.5)
WBC: 13.4 10*3/uL — ABNORMAL HIGH (ref 4.0–10.5)
nRBC: 0 % (ref 0.0–0.2)

## 2021-01-20 LAB — COMPREHENSIVE METABOLIC PANEL
ALT: 20 U/L (ref 0–44)
AST: 29 U/L (ref 15–41)
Albumin: 3.8 g/dL (ref 3.5–5.0)
Alkaline Phosphatase: 81 U/L (ref 38–126)
Anion gap: 13 (ref 5–15)
BUN: 46 mg/dL — ABNORMAL HIGH (ref 8–23)
CO2: 19 mmol/L — ABNORMAL LOW (ref 22–32)
Calcium: 10.6 mg/dL — ABNORMAL HIGH (ref 8.9–10.3)
Chloride: 103 mmol/L (ref 98–111)
Creatinine, Ser: 3.27 mg/dL — ABNORMAL HIGH (ref 0.44–1.00)
GFR, Estimated: 13 mL/min — ABNORMAL LOW (ref >60)
Glucose, Bld: 253 mg/dL — ABNORMAL HIGH (ref 70–99)
Potassium: 4.2 mmol/L (ref 3.5–5.1)
Sodium: 135 mmol/L (ref 135–145)
Total Bilirubin: 1.4 mg/dL — ABNORMAL HIGH (ref 0.3–1.2)
Total Protein: 6.8 g/dL (ref 6.5–8.1)

## 2021-01-20 LAB — PROTIME-INR
INR: 1.3 — ABNORMAL HIGH (ref 0.8–1.2)
Prothrombin Time: 16.1 seconds — ABNORMAL HIGH (ref 11.4–15.2)

## 2021-01-20 LAB — CK: Total CK: 112 U/L (ref 38–234)

## 2021-01-20 LAB — RESP PANEL BY RT-PCR (FLU A&B, COVID) ARPGX2
Influenza A by PCR: NEGATIVE
Influenza B by PCR: NEGATIVE
SARS Coronavirus 2 by RT PCR: NEGATIVE

## 2021-01-20 LAB — HEMOGLOBIN A1C
Hgb A1c MFr Bld: 7.9 % — ABNORMAL HIGH (ref 4.8–5.6)
Mean Plasma Glucose: 180.03 mg/dL

## 2021-01-20 LAB — TYPE AND SCREEN
ABO/RH(D): A POS
Antibody Screen: NEGATIVE

## 2021-01-20 LAB — CBG MONITORING, ED: Glucose-Capillary: 246 mg/dL — ABNORMAL HIGH (ref 70–99)

## 2021-01-20 MED ORDER — MORPHINE SULFATE (PF) 2 MG/ML IV SOLN
0.5000 mg | INTRAVENOUS | Status: DC | PRN
  Administered 2021-01-27: 0.5 mg via INTRAVENOUS
  Filled 2021-01-20: qty 1

## 2021-01-20 MED ORDER — ALBUTEROL SULFATE (2.5 MG/3ML) 0.083% IN NEBU: 2.5000 mg | INHALATION_SOLUTION | Freq: Four times a day (QID) | RESPIRATORY_TRACT | Status: DC | PRN

## 2021-01-20 MED ORDER — ALBUTEROL SULFATE HFA 108 (90 BASE) MCG/ACT IN AERS: 2.0000 | INHALATION_SPRAY | Freq: Four times a day (QID) | RESPIRATORY_TRACT | Status: DC | PRN

## 2021-01-20 MED ORDER — ACETAMINOPHEN 500 MG PO TABS: 1000.0000 mg | ORAL_TABLET | Freq: Four times a day (QID) | ORAL | Status: DC | PRN

## 2021-01-20 MED ORDER — FENTANYL CITRATE PF 50 MCG/ML IJ SOSY
50.0000 ug | PREFILLED_SYRINGE | Freq: Once | INTRAMUSCULAR | Status: AC
  Administered 2021-01-20: 50 ug via INTRAVENOUS
  Filled 2021-01-20: qty 1

## 2021-01-20 MED ORDER — LACTATED RINGERS IV SOLN: INTRAVENOUS | Status: DC

## 2021-01-20 MED ORDER — CLONIDINE HCL 0.1 MG PO TABS
0.1000 mg | ORAL_TABLET | Freq: Two times a day (BID) | ORAL | Status: DC
  Administered 2021-01-20: 0.1 mg via ORAL
  Filled 2021-01-20: qty 1

## 2021-01-20 MED ORDER — METOPROLOL TARTRATE 25 MG PO TABS
25.0000 mg | ORAL_TABLET | Freq: Two times a day (BID) | ORAL | Status: DC
  Administered 2021-01-20 – 2021-01-27 (×13): 25 mg via ORAL
  Filled 2021-01-20 (×13): qty 1

## 2021-01-20 MED ORDER — PANTOPRAZOLE SODIUM 40 MG PO TBEC
40.0000 mg | DELAYED_RELEASE_TABLET | Freq: Every day | ORAL | Status: DC
  Administered 2021-01-20 – 2021-01-27 (×8): 40 mg via ORAL
  Filled 2021-01-20 (×8): qty 1

## 2021-01-20 MED ORDER — INSULIN ASPART 100 UNIT/ML IJ SOLN
0.0000 [IU] | INTRAMUSCULAR | Status: DC
  Administered 2021-01-20 – 2021-01-21 (×2): 3 [IU] via SUBCUTANEOUS
  Administered 2021-01-22: 05:00:00 1 [IU] via SUBCUTANEOUS
  Administered 2021-01-22: 3 [IU] via SUBCUTANEOUS
  Administered 2021-01-22 (×4): 2 [IU] via SUBCUTANEOUS
  Administered 2021-01-23: 05:00:00 1 [IU] via SUBCUTANEOUS
  Administered 2021-01-23 (×3): 2 [IU] via SUBCUTANEOUS
  Administered 2021-01-23 (×2): 1 [IU] via SUBCUTANEOUS
  Administered 2021-01-24: 2 [IU] via SUBCUTANEOUS
  Administered 2021-01-24: 1 [IU] via SUBCUTANEOUS
  Administered 2021-01-24 – 2021-01-25 (×5): 2 [IU] via SUBCUTANEOUS
  Administered 2021-01-25 (×2): 1 [IU] via SUBCUTANEOUS
  Administered 2021-01-25: 13:00:00 3 [IU] via SUBCUTANEOUS
  Administered 2021-01-25 – 2021-01-26 (×4): 2 [IU] via SUBCUTANEOUS
  Administered 2021-01-26 (×2): 1 [IU] via SUBCUTANEOUS
  Administered 2021-01-26: 17:00:00 2 [IU] via SUBCUTANEOUS
  Administered 2021-01-26: 22:00:00 1 [IU] via SUBCUTANEOUS
  Administered 2021-01-27: 2 [IU] via SUBCUTANEOUS
  Administered 2021-01-27: 08:00:00 1 [IU] via SUBCUTANEOUS
  Administered 2021-01-27 (×3): 2 [IU] via SUBCUTANEOUS
  Administered 2021-01-28: 04:00:00 3 [IU] via SUBCUTANEOUS
  Administered 2021-01-28 (×2): 2 [IU] via SUBCUTANEOUS
  Administered 2021-01-28: 1 [IU] via SUBCUTANEOUS
  Administered 2021-01-28 (×2): 2 [IU] via SUBCUTANEOUS
  Administered 2021-01-28: 1 [IU] via SUBCUTANEOUS
  Administered 2021-01-29: 5 [IU] via SUBCUTANEOUS
  Administered 2021-01-29 – 2021-01-30 (×2): 1 [IU] via SUBCUTANEOUS
  Administered 2021-01-30 (×3): 2 [IU] via SUBCUTANEOUS
  Administered 2021-01-30: 3 [IU] via SUBCUTANEOUS
  Administered 2021-01-31: 1 [IU] via SUBCUTANEOUS
  Administered 2021-01-31: 2 [IU] via SUBCUTANEOUS
  Administered 2021-01-31: 1 [IU] via SUBCUTANEOUS
  Administered 2021-01-31: 2 [IU] via SUBCUTANEOUS
  Administered 2021-02-01: 1 [IU] via SUBCUTANEOUS

## 2021-01-20 MED ORDER — HYDROCODONE-ACETAMINOPHEN 5-325 MG PO TABS: 1.0000 | ORAL_TABLET | Freq: Four times a day (QID) | ORAL | Status: DC | PRN

## 2021-01-20 MED ORDER — DIAZEPAM 5 MG PO TABS
5.0000 mg | ORAL_TABLET | Freq: Two times a day (BID) | ORAL | Status: DC | PRN
  Administered 2021-01-23: 5 mg via ORAL
  Filled 2021-01-20 (×2): qty 1

## 2021-01-20 MED ORDER — HYDRALAZINE HCL 50 MG PO TABS
50.0000 mg | ORAL_TABLET | Freq: Two times a day (BID) | ORAL | Status: DC
  Administered 2021-01-20: 50 mg via ORAL
  Filled 2021-01-20: qty 2

## 2021-01-20 MED ORDER — SIMVASTATIN 20 MG PO TABS
40.0000 mg | ORAL_TABLET | Freq: Every evening | ORAL | Status: DC
  Administered 2021-01-20 – 2021-01-28 (×9): 40 mg via ORAL
  Filled 2021-01-20 (×9): qty 2

## 2021-01-20 NOTE — H&P (Addendum)
History and Physical    [name] [name] GEX:[id number] DOB: [birth year] DOA: 01/20/2021  PCP: Ginger Organ., MD  Patient coming from: Home  I have personally briefly reviewed patient's old medical records in [place]  Chief Complaint: Hip pain  HPI: [name] [name] is a 87 y.o. female with medical history significant of Alzheimer's dz, DM2, HTN, CKD 4, chronic anticoagulation on eliquis (?prior DVT?).  Pt presents to ED with c/o L hip pain s/p fall at home.  Symptoms constant, severe, worse with movement.  No fevers, chills.  No head injury.  Is report that pt was laying in bed all day today.   ED Course: L hip fx.  Creat 3.2 up from 2.1 baseline x2 weeks ago.   Review of Systems: As per HPI, otherwise all review of systems negative.  Past Medical History:  Diagnosis Date   Allergic rhinitis, cause unspecified    Asthma    Esophageal reflux    Sleep apnea    on C-pap- Dr young follows   Syncope 2019   Normal LVF, negative monitor   Type II or unspecified type diabetes mellitus without mention of complication, not stated as uncontrolled    Unspecified essential hypertension    mild LVH, normal LVF, grade 2 DD by echo 2019    Past Surgical History:  Procedure Laterality Date   APPENDECTOMY     FIXATION KYPHOPLASTY [place]       reports that she has never smoked. She has never used smokeless tobacco. She reports that she does not drink alcohol and does not use drugs.  Allergies  Allergen Reactions   Aspirin Nausea And Vomiting   Codeine Nausea And Vomiting   Ibuprofen Nausea And Vomiting   Meperidine Hcl    Tape     ADHESIVE TAPE CAUSES BLISTERS   Augmentin [Amoxicillin-Pot Clavulanate]     GI upset    Family History  Problem Relation Age of Onset   Lung cancer Brother    Diabetes Mother      Prior to Admission medications    Medication Sig Start Date End Date Taking? Authorizing Provider  acetaminophen (TYLENOL) 500 MG tablet Take 1,000 mg by mouth every 6 (six) hours as needed for moderate pain or headache.   Yes [provider]  albuterol (PROAIR HFA) 108 (90 Base) MCG/ACT inhaler 2 puffs every 4-6 hours as directed- rescue Patient taking differently: Inhale 2 puffs into the lungs every 6 (six) hours as needed for wheezing or shortness of breath. 06/27/18  Yes Young, Tarri Fuller D, MD  albuterol (PROVENTIL) (2.5 MG/3ML) 0.083% nebulizer solution Take 3 mLs (2.5 mg total) by nebulization every 6 (six) hours as needed for wheezing or shortness of breath. DX W10.272 06/27/18  Yes Young, Tarri Fuller D, MD  BD PEN NEEDLE NANO U/F 32G X 4 MM MISC USE TO INJECT INSULIN ONCE D 01/16/18  Yes [provider]  Blood Glucose Monitoring Suppl (ONE TOUCH ULTRA SYSTEM KIT) W/DEVICE KIT 1 kit by Does not apply route once.   Yes [provider]  chlorpheniramine-HYDROcodone (TUSSIONEX PENNKINETIC ER) 10-8 MG/5ML SUER Take 5 mLs by mouth every 12 (twelve) hours as needed for cough. 01/07/21  Yes Lacretia Leigh, MD  cloNIDine (CATAPRES) 0.1 MG tablet Take 0.1 mg by mouth 2 (two) times daily.   Yes [provider]  diazepam (VALIUM) 5 MG tablet Take 5 mg by mouth every 12 (twelve) hours as needed for anxiety.   Yes [provider]  ELIQUIS 2.5 MG TABS tablet Take 2.5 mg by mouth 2 (two) times daily. 08/09/20  Yes [provider]  furosemide (LASIX) 40 MG tablet Take 40 mg by mouth daily as needed for fluid or edema.   Yes [provider]  hydrALAZINE (APRESOLINE) 50 MG tablet Take 50 mg by mouth 2 (two) times daily. 11/27/20  Yes [provider]  Lancets MISC by Does not apply route daily.   Yes [provider]  loratadine (CLARITIN) 10 MG tablet Take 10 mg by mouth daily as needed for allergies.   Yes [provider]  metoprolol (LOPRESSOR) 50 MG tablet Take 25  mg by mouth 2 (two) times daily. 01/29/13  Yes [provider]  omeprazole (PRILOSEC) 20 MG capsule Take 20 mg by mouth every evening.   Yes [provider]  ONE TOUCH ULTRA TEST test strip Checks blood sugar 3 times daily 06/21/14  Yes [provider]  simvastatin (ZOCOR) 40 MG tablet Take 40 mg by mouth every evening. 01/15/18  Yes [provider]  telmisartan (MICARDIS) 80 MG tablet Take 80 mg by mouth daily. 07/18/13  Yes [provider]  TOUJEO SOLOSTAR 300 UNIT/ML SOPN Inject 10 Units into the skin at bedtime. 11/17/17  Yes [provider]  traMADol (ULTRAM) 50 MG tablet Take 1 tablet (50 mg total) by mouth every 6 (six) hours as needed. Patient taking differently: Take 50 mg by mouth every 6 (six) hours as needed for moderate pain. 07/08/17  Yes Montine Circle, PA-C    Physical Exam: Vitals:   01/20/21 1648 01/20/21 1951 01/20/21 2130  BP: 120/64 (!) 180/83 (!) 144/67  Pulse: 62 78 82  Resp: _0 Temp: 98.1 F (36.7 C)    TempSrc: Oral    SpO2: 100% 100%     Constitutional: NAD, calm, comfortable Eyes: PERRL, lids and conjunctivae normal ENMT: Mucous membranes are moist. Posterior pharynx clear of any exudate or lesions.Normal dentition.  Neck: normal, supple, no masses, no thyromegaly Respiratory: clear to auscultation bilaterally, no wheezing, no crackles. Normal respiratory effort. No accessory muscle use.  Cardiovascular: Regular rate and rhythm, no murmurs / rubs / gallops. No extremity edema. 2+ pedal pulses. No carotid bruits.  Abdomen: no tenderness, no masses palpated. No hepatosplenomegaly. Bowel sounds positive.  Musculoskeletal: L hip TTP Skin: no rashes, lesions, ulcers. No induration Neurologic: CN 2-12 grossly intact. Sensation intact, DTR normal. Strength 5/5 in all 4.  Psychiatric: Normal judgment and insight. Alert and oriented x 3. Normal mood.    Labs on Admission: I have personally reviewed following  labs and imaging studies  CBC: Recent Labs  Lab 01/20/21 1823  WBC 13.4*  NEUTROABS 10.8*  HGB 12.2  HCT 38.1  MCV 93.4  PLT 341   Basic Metabolic Panel: Recent Labs  Lab 01/20/21 1823  NA 135  K 4.2  CL 103  CO2 19*  GLUCOSE 253*  BUN 46*  CREATININE 3.27*  CALCIUM 10.6*   GFR: CrCl cannot be calculated (Unknown ideal weight.). Liver Function Tests: Recent Labs  Lab 01/20/21 1823  AST 29  ALT 20  ALKPHOS 81  BILITOT 1.4*  PROT 6.8  ALBUMIN 3.8   No results for input(s): LIPASE, AMYLASE in the last 168 hours. No results for input(s): AMMONIA in the last 168 hours. Coagulation  Profile: Recent Labs  Lab 01/20/21 1823  INR 1.3*   Cardiac Enzymes: No results for input(s): CKTOTAL, CKMB, CKMBINDEX, TROPONINI in the last 168 hours. BNP (last 3 results) No results for input(s): PROBNP in the last 8760 hours. HbA1C: No results for input(s): HGBA1C in the last 72 hours. CBG: No results for input(s): GLUCAP in the last 168 hours. Lipid Profile: No results for input(s): CHOL, HDL, LDLCALC, TRIG, CHOLHDL, LDLDIRECT in the last 72 hours. Thyroid Function Tests: No results for input(s): TSH, T4TOTAL, FREET4, T3FREE, THYROIDAB in the last 72 hours. Anemia Panel: No results for input(s): VITAMINB12, FOLATE, FERRITIN, TIBC, IRON, RETICCTPCT in the last 72 hours. Urine analysis:    Component Value Date/Time   COLORURINE YELLOW 06/24/2020 [place] 06/24/2020 1328   LABSPEC 1.006 06/24/2020 1328   PHURINE 5.0 06/24/2020 1328   GLUCOSEU NEGATIVE 06/24/2020 1328   [place] 06/24/2020 [place] 06/24/2020 [place] 06/24/2020 1328   PROTEINUR NEGATIVE 06/24/2020 1328   UROBILINOGEN 1.0 07/25/2009 1045   NITRITE NEGATIVE 06/24/2020 1328   LEUKOCYTESUR MODERATE (A) 06/24/2020 1328    Radiological Exams on Admission: DG Chest 1 View  Result Date: 01/20/2021 CLINICAL DATA:  Status post fall.  Hip fracture.  EXAM: CHEST  1 VIEW COMPARISON:  Chest x-ray 01/07/2021 FINDINGS: The heart and mediastinal contours are unchanged. Aortic calcification. No focal consolidation. No pulmonary edema. No pleural effusion. No pneumothorax. No acute osseous abnormality. IMPRESSION: No active disease. Electronically Signed   By: Iven Finn M.D.   On: 01/20/2021 19:11   CT HEAD WO CONTRAST (5MM)  Result Date: 01/20/2021 CLINICAL DATA:  Fall, headache EXAM: CT HEAD WITHOUT CONTRAST CT CERVICAL SPINE WITHOUT CONTRAST TECHNIQUE: Multidetector CT imaging of the head and cervical spine was performed following the standard protocol without intravenous contrast. Multiplanar CT image reconstructions of the cervical spine were also generated. COMPARISON:  None. FINDINGS: CT HEAD FINDINGS BRAIN: BRAIN Cerebral ventricle sizes are concordant with the degree of cerebral volume loss. Patchy and confluent areas of decreased attenuation are noted throughout the deep and periventricular white matter of the cerebral hemispheres bilaterally, compatible with chronic microvascular ischemic disease. No evidence of large-territorial acute infarction. No parenchymal hemorrhage. No mass lesion. No extra-axial collection. No mass effect or midline shift. No hydrocephalus. Basilar cisterns are patent. Vascular: No hyperdense vessel. Atherosclerotic calcifications are present within the cavernous internal carotid arteries. Skull: No acute fracture or focal lesion. Sinuses/Orbits: Paranasal sinuses and mastoid air cells are clear. Bilateral lens replacement. Otherwise the orbits are unremarkable. Other: None. CT CERVICAL SPINE FINDINGS Alignment: Normal. Skull base and vertebrae: Multilevel degenerative changes of the spine. No definite severe osseous neural foraminal or central canal stenosis. No acute fracture. No aggressive appearing focal osseous lesion or focal pathologic process. Soft tissues and spinal canal: No prevertebral fluid or swelling. No  visible canal hematoma. Upper chest: Unremarkable. Other: None. IMPRESSION: 1. No acute intracranial abnormality. 2. No acute displaced fracture or traumatic listhesis of the cervical spine. Electronically Signed   By: Iven Finn M.D.   On: 01/20/2021 21:10   CT Cervical Spine Wo Contrast  Result Date: 01/20/2021 CLINICAL DATA:  Fall, headache EXAM: CT HEAD WITHOUT CONTRAST CT CERVICAL SPINE WITHOUT CONTRAST TECHNIQUE: Multidetector CT imaging of the head and cervical spine was performed following the standard protocol without intravenous contrast. Multiplanar CT image reconstructions of the cervical spine were also generated. COMPARISON:  None. FINDINGS: CT HEAD FINDINGS BRAIN: BRAIN  Cerebral ventricle sizes are concordant with the degree of cerebral volume loss. Patchy and confluent areas of decreased attenuation are noted throughout the deep and periventricular white matter of the cerebral hemispheres bilaterally, compatible with chronic microvascular ischemic disease. No evidence of large-territorial acute infarction. No parenchymal hemorrhage. No mass lesion. No extra-axial collection. No mass effect or midline shift. No hydrocephalus. Basilar cisterns are patent. Vascular: No hyperdense vessel. Atherosclerotic calcifications are present within the cavernous internal carotid arteries. Skull: No acute fracture or focal lesion. Sinuses/Orbits: Paranasal sinuses and mastoid air cells are clear. Bilateral lens replacement. Otherwise the orbits are unremarkable. Other: None. CT CERVICAL SPINE FINDINGS Alignment: Normal. Skull base and vertebrae: Multilevel degenerative changes of the spine. No definite severe osseous neural foraminal or central canal stenosis. No acute fracture. No aggressive appearing focal osseous lesion or focal pathologic process. Soft tissues and spinal canal: No prevertebral fluid or swelling. No visible canal hematoma. Upper chest: Unremarkable. Other: None. IMPRESSION: 1. No acute  intracranial abnormality. 2. No acute displaced fracture or traumatic listhesis of the cervical spine. Electronically Signed   By: Iven Finn M.D.   On: 01/20/2021 21:10   CT Knee Left Wo Contrast  Result Date: 01/20/2021 CLINICAL DATA:  Knee trauma, tenderness or effusion EXAM: CT OF THE  KNEE WITHOUT CONTRAST TECHNIQUE: Multidetector CT imaging of the left knee was performed according to the standard protocol. Multiplanar CT image reconstructions were also generated. COMPARISON:  X-ray left knee 01/20/2021 FINDINGS: Bones/Joint/Cartilage Moderate to severe tricompartmental degenerative changes of the knee. Question associated chondrocalcinosis. Slight medial subluxation of the femur in relation to the patella and tibia likely due to degenerative changes. No acute displaced fracture or dislocation. Trace associated joint effusion. Ligaments Suboptimally assessed by CT. Muscles and Tendons Grossly unremarkable. Soft tissues Unremarkable.  Vascular calcifications. IMPRESSION: No acute displaced fracture or dislocation in a patient with moderate to severe tricompartmental degenerative changes. Slight medial subluxation of the femur in relation to the patella and tibia likely due to degenerative changes. Electronically Signed   By: Iven Finn M.D.   On: 01/20/2021 21:25   DG Knee Complete 4 Views Left  Result Date: 01/20/2021 CLINICAL DATA:  Fall, left knee pain EXAM: LEFT KNEE - COMPLETE 4+ VIEW COMPARISON:  None. FINDINGS: Moderate tricompartmental degenerative changes. Chondrocalcinosis in the medial and lateral compartments. No fracture or dislocation is seen. Visualized soft tissues are within normal limits. No suprapatellar knee joint effusion. IMPRESSION: Moderate degenerative changes, as above. Electronically Signed   By: Julian Hy M.D.   On: 01/20/2021 19:22   DG Hip Unilat With Pelvis 2-3 Views Left  Result Date: 01/20/2021 CLINICAL DATA:  Fall, left hip pain EXAM: DG HIP  (WITH OR WITHOUT PELVIS) 2-3V LEFT COMPARISON:  None. FINDINGS: Intertrochanteric left hip fracture, mildly displaced. Right hip is intact. Visualized bony pelvis appears intact. Lumbar spine fixation hardware, incompletely visualized. IMPRESSION: Intertrochanteric left hip fracture, mildly displaced. Electronically Signed   By: Julian Hy M.D.   On: 01/20/2021 19:22    EKG: Independently reviewed.  Assessment/Plan Principal Problem:   Closed left hip fracture, initial encounter ([place]) Active Problems:   DM2 (diabetes mellitus, type 2) (HCC)   Essential hypertension   CKD (chronic kidney disease) stage 4, GFR 15-29 ml/min (HCC)   Alzheimer's dementia (HCC)    L hip fx - Hip fx pathway Pain control including IV opiates per pathway Holding eliquis Dr. [name] consulted for OR repair.  OR either Sat or Sun. NPO after MN CXR  neg, EKG without acute findings. AKI on CKD 4 - LR at 100 cc/hr Checking CPK Check UA Hold diuretics Hold ARB Strict intake and output Repeat BMP in AM DM2 - Hold home meds Sensitive SSI Q4H HTN- Cont BB, hydralazine, clonidine Holding diuretics and ARB  DVT prophylaxis: SCDs Code Status: Full Family Communication: No family in room Disposition Plan: SNF after admit Consults called: Dr. [name] Admission status: Admit to inpatient  Severity of Illness: The appropriate patient status for this patient is INPATIENT. Inpatient status is judged to be reasonable and necessary in order to provide the required intensity of service to ensure the patient's safety. The patient's presenting symptoms, physical exam findings, and initial radiographic and laboratory data in the context of their chronic comorbidities is felt to place them at high risk for further clinical deterioration. Furthermore, it is not anticipated that the patient will be medically stable for discharge from the hospital within 2 midnights of admission.   * I certify that at the point of admission it  is my clinical judgment that the patient will require inpatient hospital care spanning beyond 2 midnights from the point of admission due to high intensity of service, high risk for further deterioration and high frequency of surveillance required.*   Arilla Hice M. DO [facility]  How to contact the [facility] Attending or Consulting provider [facility] or covering provider during after hours [place], for this patient?  Check the care team in [facility] and look for a) attending/consulting [facility] provider listed and b) the [facility] team listed Log into [email or web address]  [employer] Physician Scheduling and messaging for groups and whole hospitals  On call and physician scheduling software for group practices, residents, hospitalists and other medical providers for call, clinic, rotation and shift schedules. On[employer] is a hospital-wide system for scheduling doctors and paging doctors on call. EasyPlot is for scientific plotting and data analysis.  [email or web address]  and use [place]'s universal password to access. If you do not have the password, please contact the hospital operator.  Locate the [facility] provider you are looking for under [facility] and page to a number that you can be directly reached. If you still have difficulty reaching the provider, please page the [facility] (Director on Call) for the Hospitalists listed on amion for assistance.  01/20/2021, 10:12 PM

## 2021-01-20 NOTE — ED Notes (Signed)
Pt given water and attempted to provide meal. Pt declined food; informed pt of NPO status at midnight

## 2021-01-20 NOTE — ED Provider Notes (Signed)
[facility] EMERGENCY DEPARTMENT Provider Note   CSN: [id number] Arrival date & time: 01/20/21  1647     History Chief Complaint  Patient presents with   [name] [name] is a 87 y.o. female.  The history is provided by the patient and medical records. No language interpreter was used.  Fall This is a new problem. The current episode started 6 to 12 hours ago. The problem occurs rarely. The problem has not changed since onset.Associated symptoms include headaches. Pertinent negatives include no chest pain, no abdominal pain and no shortness of breath. Nothing aggravates the symptoms. Nothing relieves the symptoms. She has tried nothing for the symptoms. The treatment provided no relief.      Past Medical History:  Diagnosis Date   Allergic rhinitis, cause unspecified    Asthma    Esophageal reflux    Sleep apnea    on C-pap- Dr young follows   Syncope 2019   Normal LVF, negative monitor   Type II or unspecified type diabetes mellitus without mention of complication, not stated as uncontrolled    Unspecified essential hypertension    mild LVH, normal LVF, grade 2 DD by echo 2019    Patient Active Problem List   Diagnosis Date Noted   Renal insufficiency 06/18/2019   Sleep apnea    Essential hypertension    Hyperlipidemia 08/28/2017   History of syncope 08/28/2017   Acute frontal sinusitis 09/20/2013   Obstructive sleep apnea 02/08/2011   RENAL INSUFFICIENCY 09/29/2007   DIABETES, TYPE 2 03/31/2007   OBESITY 03/31/2007   Venous insufficiency (chronic) (peripheral) 03/31/2007   Asthmatic bronchitis , chronic (HCC) 03/31/2007   Seasonal and perennial allergic rhinitis 03/31/2007   Asthma, mild persistent 03/31/2007   G E R D 03/31/2007    Past Surgical History:  Procedure Laterality Date   APPENDECTOMY     FIXATION KYPHOPLASTY LUMBAR SPINE     HIATAL HERNIA REPAIR     LUMBAR SPINE SURGERY     TUBAL LIGATION     VAGINAL HYSTERECTOMY        OB History   No obstetric history on file.     Family History  Problem Relation Age of Onset   Lung cancer Brother    Diabetes Mother     Social History   Tobacco Use   Smoking status: Never   Smokeless tobacco: Never  Vaping Use   Vaping Use: Never used  Substance Use Topics   Alcohol use: Never   Drug use: Never    Home Medications Prior to Admission medications   Medication Sig Start Date End Date Taking? Authorizing Provider  albuterol (PROAIR HFA) 108 (90 Base) MCG/ACT inhaler 2 puffs every 4-6 hours as directed- rescue 06/27/18   Baird Lyons D, MD  albuterol (PROVENTIL) (2.5 MG/3ML) 0.083% nebulizer solution Take 3 mLs (2.5 mg total) by nebulization every 6 (six) hours as needed for wheezing or shortness of breath. DX C58.527 06/27/18   Deneise Lever, MD  BD PEN NEEDLE NANO U/F 32G X 4 MM MISC USE TO INJECT INSULIN ONCE D 01/16/18   [provider]  Blood Glucose Monitoring Suppl (ONE TOUCH ULTRA SYSTEM KIT) W/DEVICE KIT 1 kit by Does not apply route once.    [provider]  cefdinir (OMNICEF) 300 MG capsule Take 1 capsule (300 mg total) by mouth 2 (two) times daily. 01/07/21   Lacretia Leigh, MD  chlorpheniramine-HYDROcodone [facility]) 10-8 MG/5ML SUER Take  5 mLs by mouth every 12 (twelve) hours as needed for cough. 01/07/21   Lacretia Leigh, MD  cloNIDine (CATAPRES) 0.1 MG tablet Take 0.1 mg by mouth 2 (two) times daily.    [provider]  diazepam (VALIUM) 5 MG tablet Take 5 mg by mouth 2 (two) times daily.    [provider]  ezetimibe-simvastatin (VYTORIN) 10-40 MG per tablet Take 1 tablet by mouth at bedtime.    [provider]  furosemide (LASIX) 40 MG tablet Take 40 mg by mouth daily.    [provider]  Lancets MISC by Does not apply route daily.    [provider]  linagliptin (TRADJENTA) 5 MG TABS tablet Take 5 mg by mouth daily.    [provider]  Lysine 500 MG  TABS Take 1 tablet by mouth daily.     [provider]  metoprolol (LOPRESSOR) 50 MG tablet Take 1 tablet by mouth 2 (two) times daily. 01/29/13   [provider]  NEOMYCIN-POLYMYXIN-HYDROCORTISONE (CORTISPORIN) 1 % SOLN OTIC solution Apply 1-2 drops to toe BID after soaking 04/03/18   Hyatt, Max T, DPM  omeprazole (PRILOSEC) 20 MG capsule Take 20 mg by mouth daily.    [provider]  ONE TOUCH ULTRA TEST test strip  06/21/14   [provider]  simvastatin (ZOCOR) 40 MG tablet Take 40 mg by mouth daily at 6 PM.  01/15/18   [provider]  telmisartan (MICARDIS) 80 MG tablet Take 1 tablet by mouth daily. 07/18/13   [provider]  TOUJEO SOLOSTAR 300 UNIT/ML SOPN Inject 10 Units into the skin daily. 11/17/17   [provider]  traMADol (ULTRAM) 50 MG tablet Take 1 tablet (50 mg total) by mouth every 6 (six) hours as needed. 07/08/17   Montine Circle, PA-C  warfarin (COUMADIN) 6 MG tablet M/F add extra 13m to make 93m1/28/20   [provider]    Allergies    Aspirin, Codeine, Ibuprofen, Meperidine hcl, and Augmentin [amoxicillin-pot clavulanate]  Review of Systems   Review of Systems  Constitutional:  Negative for chills, fatigue and fever.  HENT:  Negative for congestion.   Eyes:  Negative for visual disturbance.  Respiratory:  Negative for cough, chest tightness, shortness of breath and wheezing.   Cardiovascular:  Negative for chest pain, palpitations and leg swelling.  Gastrointestinal:  Negative for abdominal pain, constipation, diarrhea, nausea and vomiting.  Genitourinary:  Negative for dysuria and flank pain.  Musculoskeletal:  Positive for neck pain. Negative for back pain and neck stiffness.  Skin:  Negative for rash and wound.  Neurological:  Positive for headaches. Negative for weakness, light-headedness and numbness.  Psychiatric/Behavioral:  Negative for agitation.   All other systems reviewed and are  negative.  Physical Exam Updated Vital Signs BP 120/64 (BP Location: Right Arm)   Pulse 62   Temp 98.1 F (36.7 C) (Oral)   Resp 20   SpO2 100%   Physical Exam Vitals and nursing note reviewed.  Constitutional:      General: She is not in acute distress.    Appearance: She is well-developed. She is not ill-appearing, toxic-appearing or diaphoretic.  HENT:     Head: Normocephalic and atraumatic.     Nose: Nose normal.     Mouth/Throat:     Mouth: Mucous membranes are moist.  Eyes:     Conjunctiva/sclera: Conjunctivae normal.  Cardiovascular:     Rate and Rhythm: Normal rate and regular rhythm.  Heart sounds: No murmur heard. Pulmonary:     Effort: Pulmonary effort is normal. No respiratory distress.     Breath sounds: Normal breath sounds. No wheezing, rhonchi or rales.  Chest:     Chest wall: No tenderness.  Abdominal:     General: Abdomen is flat. There is no distension.     Palpations: Abdomen is soft.     Tenderness: There is no abdominal tenderness. There is no guarding or rebound.  Musculoskeletal:        General: Tenderness and signs of injury present.     Cervical back: Neck supple.       Legs:     Comments: Intact sensation, strength, and pulses in left leg.  Severe tenderness in the left hip and left knee.  Also tenderness in the left lateral neck.  Skin:    General: Skin is warm and dry.     Capillary Refill: Capillary refill takes less than 2 seconds.     Findings: No erythema or rash.  Neurological:     General: No focal deficit present.     Mental Status: She is alert.     Sensory: No sensory deficit.     Motor: No weakness.  Psychiatric:        Mood and Affect: Mood normal.    ED Results / Procedures / Treatments   Labs (all labs ordered are listed, but only abnormal results are displayed) Labs Reviewed  CBC WITH DIFFERENTIAL/PLATELET - Abnormal; Notable for the following components:      Result Value   WBC 13.4 (*)    Neutro Abs 10.8 (*)     Abs Immature Granulocytes 0.24 (*)    All other components within normal limits  COMPREHENSIVE METABOLIC PANEL - Abnormal; Notable for the following components:   CO2 19 (*)    Glucose, Bld 253 (*)    BUN 46 (*)    Creatinine, Ser 3.27 (*)    Calcium 10.6 (*)    Total Bilirubin 1.4 (*)    GFR, Estimated 13 (*)    All other components within normal limits  PROTIME-INR - Abnormal; Notable for the following components:   Prothrombin Time 16.1 (*)    INR 1.3 (*)    All other components within normal limits  HEMOGLOBIN A1C - Abnormal; Notable for the following components:   Hgb A1c MFr Bld 7.9 (*)    All other components within normal limits  CBG MONITORING, ED - Abnormal; Notable for the following components:   Glucose-Capillary 246 (*)    All other components within normal limits  RESP PANEL BY RT-PCR (FLU A&B, COVID) ARPGX2  CK  BASIC METABOLIC PANEL  URINALYSIS, COMPLETE (UACMP) WITH MICROSCOPIC  TYPE AND SCREEN    EKG None  Radiology DG Chest 1 View  Result Date: 01/20/2021 CLINICAL DATA:  Status post fall.  Hip fracture. EXAM: CHEST  1 VIEW COMPARISON:  Chest x-ray 01/07/2021 FINDINGS: The heart and mediastinal contours are unchanged. Aortic calcification. No focal consolidation. No pulmonary edema. No pleural effusion. No pneumothorax. No acute osseous abnormality. IMPRESSION: No active disease. Electronically Signed   By: Iven Finn M.D.   On: 01/20/2021 19:11   CT HEAD WO CONTRAST (5MM)  Result Date: 01/20/2021 CLINICAL DATA:  Fall, headache EXAM: CT HEAD WITHOUT CONTRAST CT CERVICAL SPINE WITHOUT CONTRAST TECHNIQUE: Multidetector CT imaging of the head and cervical spine was performed following the standard protocol without intravenous contrast. Multiplanar CT image reconstructions of the cervical spine were  also generated. COMPARISON:  None. FINDINGS: CT HEAD FINDINGS BRAIN: BRAIN Cerebral ventricle sizes are concordant with the degree of cerebral volume loss.  Patchy and confluent areas of decreased attenuation are noted throughout the deep and periventricular white matter of the cerebral hemispheres bilaterally, compatible with chronic microvascular ischemic disease. No evidence of large-territorial acute infarction. No parenchymal hemorrhage. No mass lesion. No extra-axial collection. No mass effect or midline shift. No hydrocephalus. Basilar cisterns are patent. Vascular: No hyperdense vessel. Atherosclerotic calcifications are present within the cavernous internal carotid arteries. Skull: No acute fracture or focal lesion. Sinuses/Orbits: Paranasal sinuses and mastoid air cells are clear. Bilateral lens replacement. Otherwise the orbits are unremarkable. Other: None. CT CERVICAL SPINE FINDINGS Alignment: Normal. Skull base and vertebrae: Multilevel degenerative changes of the spine. No definite severe osseous neural foraminal or central canal stenosis. No acute fracture. No aggressive appearing focal osseous lesion or focal pathologic process. Soft tissues and spinal canal: No prevertebral fluid or swelling. No visible canal hematoma. Upper chest: Unremarkable. Other: None. IMPRESSION: 1. No acute intracranial abnormality. 2. No acute displaced fracture or traumatic listhesis of the cervical spine. Electronically Signed   By: Iven Finn M.D.   On: 01/20/2021 21:10   CT Cervical Spine Wo Contrast  Result Date: 01/20/2021 CLINICAL DATA:  Fall, headache EXAM: CT HEAD WITHOUT CONTRAST CT CERVICAL SPINE WITHOUT CONTRAST TECHNIQUE: Multidetector CT imaging of the head and cervical spine was performed following the standard protocol without intravenous contrast. Multiplanar CT image reconstructions of the cervical spine were also generated. COMPARISON:  None. FINDINGS: CT HEAD FINDINGS BRAIN: BRAIN Cerebral ventricle sizes are concordant with the degree of cerebral volume loss. Patchy and confluent areas of decreased attenuation are noted throughout the deep and  periventricular white matter of the cerebral hemispheres bilaterally, compatible with chronic microvascular ischemic disease. No evidence of large-territorial acute infarction. No parenchymal hemorrhage. No mass lesion. No extra-axial collection. No mass effect or midline shift. No hydrocephalus. Basilar cisterns are patent. Vascular: No hyperdense vessel. Atherosclerotic calcifications are present within the cavernous internal carotid arteries. Skull: No acute fracture or focal lesion. Sinuses/Orbits: Paranasal sinuses and mastoid air cells are clear. Bilateral lens replacement. Otherwise the orbits are unremarkable. Other: None. CT CERVICAL SPINE FINDINGS Alignment: Normal. Skull base and vertebrae: Multilevel degenerative changes of the spine. No definite severe osseous neural foraminal or central canal stenosis. No acute fracture. No aggressive appearing focal osseous lesion or focal pathologic process. Soft tissues and spinal canal: No prevertebral fluid or swelling. No visible canal hematoma. Upper chest: Unremarkable. Other: None. IMPRESSION: 1. No acute intracranial abnormality. 2. No acute displaced fracture or traumatic listhesis of the cervical spine. Electronically Signed   By: Iven Finn M.D.   On: 01/20/2021 21:10   CT Knee Left Wo Contrast  Result Date: 01/20/2021 CLINICAL DATA:  Knee trauma, tenderness or effusion EXAM: CT OF THE  KNEE WITHOUT CONTRAST TECHNIQUE: Multidetector CT imaging of the left knee was performed according to the standard protocol. Multiplanar CT image reconstructions were also generated. COMPARISON:  X-ray left knee 01/20/2021 FINDINGS: Bones/Joint/Cartilage Moderate to severe tricompartmental degenerative changes of the knee. Question associated chondrocalcinosis. Slight medial subluxation of the femur in relation to the patella and tibia likely due to degenerative changes. No acute displaced fracture or dislocation. Trace associated joint effusion. Ligaments  Suboptimally assessed by CT. Muscles and Tendons Grossly unremarkable. Soft tissues Unremarkable.  Vascular calcifications. IMPRESSION: No acute displaced fracture or dislocation in a patient with moderate to severe tricompartmental degenerative  changes. Slight medial subluxation of the femur in relation to the patella and tibia likely due to degenerative changes. Electronically Signed   By: Iven Finn M.D.   On: 01/20/2021 21:25   DG Knee Complete 4 Views Left  Result Date: 01/20/2021 CLINICAL DATA:  Fall, left knee pain EXAM: LEFT KNEE - COMPLETE 4+ VIEW COMPARISON:  None. FINDINGS: Moderate tricompartmental degenerative changes. Chondrocalcinosis in the medial and lateral compartments. No fracture or dislocation is seen. Visualized soft tissues are within normal limits. No suprapatellar knee joint effusion. IMPRESSION: Moderate degenerative changes, as above. Electronically Signed   By: Julian Hy M.D.   On: 01/20/2021 19:22   DG Hip Unilat With Pelvis 2-3 Views Left  Result Date: 01/20/2021 CLINICAL DATA:  Fall, left hip pain EXAM: DG HIP (WITH OR WITHOUT PELVIS) 2-3V LEFT COMPARISON:  None. FINDINGS: Intertrochanteric left hip fracture, mildly displaced. Right hip is intact. Visualized bony pelvis appears intact. Lumbar spine fixation hardware, incompletely visualized. IMPRESSION: Intertrochanteric left hip fracture, mildly displaced. Electronically Signed   By: Julian Hy M.D.   On: 01/20/2021 19:22    Procedures Procedures   Medications Ordered in ED Medications  HYDROcodone-acetaminophen (NORCO/VICODIN) 5-325 MG per tablet 1-2 tablet (has no administration in time range)  morphine 2 MG/ML injection 0.5 mg (has no administration in time range)  insulin aspart (novoLOG) injection 0-9 Units (3 Units Subcutaneous Given 01/20/21 2239)  acetaminophen (TYLENOL) tablet 1,000 mg (has no administration in time range)  albuterol (PROVENTIL) (2.5 MG/3ML) 0.083% nebulizer solution  2.5 mg (has no administration in time range)  cloNIDine (CATAPRES) tablet 0.1 mg (0.1 mg Oral Given 01/20/21 2243)  diazepam (VALIUM) tablet 5 mg (has no administration in time range)  hydrALAZINE (APRESOLINE) tablet 50 mg (50 mg Oral Given 01/20/21 2240)  metoprolol tartrate (LOPRESSOR) tablet 25 mg (25 mg Oral Given 01/20/21 2241)  pantoprazole (PROTONIX) EC tablet 40 mg (40 mg Oral Given 01/20/21 2241)  simvastatin (ZOCOR) tablet 40 mg (40 mg Oral Given 01/20/21 2240)  lactated ringers infusion (has no administration in time range)  fentaNYL (SUBLIMAZE) injection 50 mcg (50 mcg Intravenous Given 01/20/21 1859)    ED Course  I have reviewed the triage vital signs and the nursing notes.  Pertinent labs & imaging results that were available during my care of the patient were reviewed by me and considered in my medical decision making (see chart for details).    MDM Rules/Calculators/A&P                           [name] [name] is a 87 y.o. female with a past medical history significant for diabetes, asthma, hypertension, hyperlipidemia, renal insufficiency, and sleep apnea who presents for a fall.  According to patient, she was walking down the hallway and going around a corner when she got tripped up and fell onto her left hip.  She was also in pain in her left knee.  Initially, patient was complaining of some mild headache and some left-sided neck discomfort but that has improved.  She is denying any pain in her chest or abdomen.  She denies any numbness, tingling, or weakness in her feet but is having the pain in her left hip whenever she tries to stand or move.  Denies previous bony fractures.  On exam, lungs clear and chest nontender.  Abdomen nontender.  Left hip is tender to palpation and manipulation in the left knee is also tender.  Intact sensation,  strength in the foot, and pulses distally.  Some tenderness of the paraspinal neck on the left.  Otherwise exam reassuring and patient  is alert and oriented.  Patient had x-ray of the left hip and left knee which does show evidence of intertrochanteric fracture on the left.  Knee image did not show fracture but clinically I am concerned about possible injury based on the amount of tenderness present.  CT was obtained which confirms no fracture.  Spoke to Dr. [name] with orthopedics who feels she needs medicine admission for the hip fracture and he will see for likely surgery.  Will remain n.p.o. midnight and will admit to medicine for further management.    Final Clinical Impression(s) / ED Diagnoses Final diagnoses:  Fall, initial encounter    Clinical Impression: 1. Fall, initial encounter     Disposition: Admit  This note was prepared with assistance of Systems analyst. Occasional wrong-word or sound-a-like substitutions may have occurred due to the inherent limitations of voice recognition software.       Naiomy Watters, Gwenyth Allegra, MD 01/20/21 [phone]

## 2021-01-20 NOTE — ED Triage Notes (Signed)
Pt BIB [facility] from home where she has a caregiver, hx Alzheimer's, pt had a fall today, c/o left hip and knee pain. EMS noted swelling to left knee.

## 2021-01-20 NOTE — ED Notes (Signed)
Per x-ray, pt refusing to stand and they are unable to complete xrays until pt is in a bed and can be transferred to xray table.

## 2021-01-20 NOTE — ED Provider Notes (Signed)
Emergency Medicine Provider Triage Evaluation Note  [name] [name] , a 87 y.o. female  was evaluated in triage.  Pt complains of pain to her left hip and left knee status post fall today.  Patient has history of Alzheimer's.  And is unsure why she fell.  EMS reported that patient apparently forgot that she fell and then began complaining of some pain.  She does have a caregiver at home.  They did not report any head injury.  Patient states she is unsure if she hit her head.  There are no signs of head trauma at this time.  EMS reported they are unsure if she currently takes Coumadin.  Review of Systems  Positive: + left hip pain/left knee pain Negative: Difficult to assess due to hx dementia  Physical Exam  BP 120/64 (BP Location: Right Arm)   Pulse 62   Temp 98.1 F (36.7 C) (Oral)   Resp 20   SpO2 100%  Gen:   Awake, no distress   Resp:  Normal effort  MSK:   Moves extremities without difficulty  Other:  + swelling and TTP to left knee. + TTP to left hip. No obvious external rotation appreciated. 2+ DP pulse. NO signs of head injury at this time.   Medical Decision Making  Medically screening exam initiated at 4:56 PM.  Appropriate orders placed.  LYNNSIE LINDERS was informed that the remainder of the evaluation will be completed by another provider, this initial triage assessment does not replace that evaluation, and the importance of remaining in the ED until their evaluation is complete.     Eustaquio Maize, PA-C 01/20/21 1659    Regan Lemming, MD 01/20/21 [phone]

## 2021-01-20 NOTE — ED Notes (Signed)
Patient transported to X-ray 

## 2021-01-20 NOTE — Progress Notes (Signed)
Orthopedic Tech Progress Note Patient Details:  [name] [name] 1929/10/10 [id number]  Patient ID: [name] [name], female   DOB: 1930/03/10, 87 y.o.   MRN: [id number] No OHF; pt is over 72. [name] [name] 01/20/2021, 9:50 PM

## 2021-01-21 ENCOUNTER — Inpatient Hospital Stay (HOSPITAL_COMMUNITY): Payer: Medicare Other | Admitting: Certified Registered Nurse Anesthetist

## 2021-01-21 ENCOUNTER — Inpatient Hospital Stay (HOSPITAL_COMMUNITY): Payer: Medicare Other

## 2021-01-21 ENCOUNTER — Encounter (HOSPITAL_COMMUNITY)
Admission: EM | Disposition: A | Discharge status: Skilled Nursing Facility | Payer: Self-pay | Source: Home / Self Care | Attending: Internal Medicine

## 2021-01-21 ENCOUNTER — Encounter (HOSPITAL_COMMUNITY): Payer: Self-pay | Admitting: Internal Medicine

## 2021-01-21 DIAGNOSIS — S72002A Fracture of unspecified part of neck of left femur, initial encounter for closed fracture: Secondary | ICD-10-CM

## 2021-01-21 HISTORY — PX: INTRAMEDULLARY (IM) NAIL INTERTROCHANTERIC: SHX5875

## 2021-01-21 LAB — BASIC METABOLIC PANEL
Anion gap: 10 (ref 5–15)
BUN: 42 mg/dL — ABNORMAL HIGH (ref 8–23)
CO2: 21 mmol/L — ABNORMAL LOW (ref 22–32)
Calcium: 9.9 mg/dL (ref 8.9–10.3)
Chloride: 105 mmol/L (ref 98–111)
Creatinine, Ser: 2.88 mg/dL — ABNORMAL HIGH (ref 0.44–1.00)
GFR, Estimated: 15 mL/min — ABNORMAL LOW (ref >60)
Glucose, Bld: 195 mg/dL — ABNORMAL HIGH (ref 70–99)
Potassium: 3.8 mmol/L (ref 3.5–5.1)
Sodium: 136 mmol/L (ref 135–145)

## 2021-01-21 LAB — GLUCOSE, CAPILLARY
Glucose-Capillary: 213 mg/dL — ABNORMAL HIGH (ref 70–99)
Glucose-Capillary: 213 mg/dL — ABNORMAL HIGH (ref 70–99)

## 2021-01-21 LAB — CBG MONITORING, ED
Glucose-Capillary: 177 mg/dL — ABNORMAL HIGH (ref 70–99)
Glucose-Capillary: 180 mg/dL — ABNORMAL HIGH (ref 70–99)
Glucose-Capillary: 197 mg/dL — ABNORMAL HIGH (ref 70–99)

## 2021-01-21 LAB — SURGICAL PCR SCREEN
MRSA, PCR: NEGATIVE
Staphylococcus aureus: NEGATIVE

## 2021-01-21 SURGERY — FIXATION, FRACTURE, INTERTROCHANTERIC, WITH INTRAMEDULLARY ROD
Anesthesia: General | Site: Hip | Laterality: Left

## 2021-01-21 MED ORDER — SUGAMMADEX SODIUM 200 MG/2ML IV SOLN
INTRAVENOUS | Status: DC | PRN
  Administered 2021-01-21: 200 mg via INTRAVENOUS

## 2021-01-21 MED ORDER — PHENOL 1.4 % MT LIQD: 1.0000 | OROMUCOSAL | Status: DC | PRN

## 2021-01-21 MED ORDER — CEFAZOLIN SODIUM-DEXTROSE 2-4 GM/100ML-% IV SOLN
INTRAVENOUS | Status: AC
  Administered 2021-01-22: 2 g via INTRAVENOUS
  Filled 2021-01-21: qty 100

## 2021-01-21 MED ORDER — OXYCODONE HCL 5 MG PO TABS
5.0000 mg | ORAL_TABLET | ORAL | Status: DC | PRN
  Administered 2021-01-22: 5 mg via ORAL
  Filled 2021-01-21: qty 2
  Filled 2021-01-21: qty 1

## 2021-01-21 MED ORDER — LIDOCAINE 2% (20 MG/ML) 5 ML SYRINGE
INTRAMUSCULAR | Status: DC | PRN
  Administered 2021-01-21: 40 mg via INTRAVENOUS

## 2021-01-21 MED ORDER — METHOCARBAMOL 500 MG PO TABS: 500.0000 mg | ORAL_TABLET | Freq: Four times a day (QID) | ORAL | Status: DC | PRN

## 2021-01-21 MED ORDER — POLYETHYLENE GLYCOL 3350 17 G PO PACK: 17.0000 g | PACK | Freq: Every day | ORAL | Status: DC | PRN

## 2021-01-21 MED ORDER — TRANEXAMIC ACID-NACL 1000-0.7 MG/100ML-% IV SOLN: 1000.0000 mg | INTRAVENOUS | Status: DC

## 2021-01-21 MED ORDER — ACETAMINOPHEN 10 MG/ML IV SOLN
INTRAVENOUS | Status: AC
  Administered 2021-01-21: 1000 mg via INTRAVENOUS
  Filled 2021-01-21: qty 100

## 2021-01-21 MED ORDER — HYDROMORPHONE HCL 1 MG/ML IJ SOLN: 0.5000 mg | INTRAMUSCULAR | Status: DC | PRN

## 2021-01-21 MED ORDER — OXYCODONE HCL 5 MG PO TABS
10.0000 mg | ORAL_TABLET | ORAL | Status: DC | PRN
  Administered 2021-01-23: 10 mg via ORAL

## 2021-01-21 MED ORDER — 0.9 % SODIUM CHLORIDE (POUR BTL) OPTIME
TOPICAL | Status: DC | PRN
  Administered 2021-01-21: 1000 mL

## 2021-01-21 MED ORDER — FENTANYL CITRATE (PF) 250 MCG/5ML IJ SOLN
INTRAMUSCULAR | Status: AC
  Filled 2021-01-21: qty 5

## 2021-01-21 MED ORDER — LACTATED RINGERS IV BOLUS
1000.0000 mL | Freq: Once | INTRAVENOUS | Status: AC
  Administered 2021-01-21: 1000 mL via INTRAVENOUS

## 2021-01-21 MED ORDER — OXYCODONE HCL 5 MG PO TABS: 5.0000 mg | ORAL_TABLET | Freq: Once | ORAL | Status: DC | PRN

## 2021-01-21 MED ORDER — TRANEXAMIC ACID 1000 MG/10ML IV SOLN
INTRAVENOUS | Status: DC | PRN
  Administered 2021-01-21: 2000 mg via TOPICAL

## 2021-01-21 MED ORDER — ONDANSETRON HCL 4 MG/2ML IJ SOLN
INTRAMUSCULAR | Status: DC | PRN
  Administered 2021-01-21: 4 mg via INTRAVENOUS

## 2021-01-21 MED ORDER — PROPOFOL 10 MG/ML IV BOLUS
INTRAVENOUS | Status: AC
  Filled 2021-01-21: qty 20

## 2021-01-21 MED ORDER — OXYCODONE-ACETAMINOPHEN 5-325 MG PO TABS: 1.0000 | ORAL_TABLET | Freq: Three times a day (TID) | ORAL | 0 refills | Status: DC | PRN

## 2021-01-21 MED ORDER — PHENYLEPHRINE 40 MCG/ML (10ML) SYRINGE FOR IV PUSH (FOR BLOOD PRESSURE SUPPORT)
PREFILLED_SYRINGE | INTRAVENOUS | Status: DC | PRN
  Administered 2021-01-21: 80 ug via INTRAVENOUS
  Administered 2021-01-21 (×3): 40 ug via INTRAVENOUS
  Administered 2021-01-21: 120 ug via INTRAVENOUS

## 2021-01-21 MED ORDER — OXYCODONE HCL 5 MG/5ML PO SOLN: 5.0000 mg | Freq: Once | ORAL | Status: DC | PRN

## 2021-01-21 MED ORDER — TRANEXAMIC ACID-NACL 1000-0.7 MG/100ML-% IV SOLN
1000.0000 mg | Freq: Once | INTRAVENOUS | Status: AC
  Administered 2021-01-21: 1000 mg via INTRAVENOUS
  Filled 2021-01-21: qty 100

## 2021-01-21 MED ORDER — ALUM & MAG HYDROXIDE-SIMETH 200-200-20 MG/5ML PO SUSP: 30.0000 mL | ORAL | Status: DC | PRN

## 2021-01-21 MED ORDER — CHLORHEXIDINE GLUCONATE 0.12 % MT SOLN
OROMUCOSAL | Status: AC
  Filled 2021-01-21: qty 15

## 2021-01-21 MED ORDER — ACETAMINOPHEN 500 MG PO TABS
1000.0000 mg | ORAL_TABLET | Freq: Four times a day (QID) | ORAL | Status: AC
  Administered 2021-01-21 – 2021-01-22 (×3): 1000 mg via ORAL
  Filled 2021-01-21 (×4): qty 2

## 2021-01-21 MED ORDER — ADULT MULTIVITAMIN W/MINERALS CH
1.0000 | ORAL_TABLET | Freq: Every day | ORAL | Status: DC
  Administered 2021-01-22 – 2021-01-28 (×7): 1 via ORAL
  Filled 2021-01-21 (×7): qty 1

## 2021-01-21 MED ORDER — SODIUM CHLORIDE 0.9 % IV SOLN: INTRAVENOUS | Status: DC

## 2021-01-21 MED ORDER — TRANEXAMIC ACID 1000 MG/10ML IV SOLN
2000.0000 mg | INTRAVENOUS | Status: DC
  Filled 2021-01-21: qty 20

## 2021-01-21 MED ORDER — SODIUM CHLORIDE 0.9 % IV SOLN: INTRAVENOUS | Status: DC | PRN

## 2021-01-21 MED ORDER — CEFAZOLIN SODIUM-DEXTROSE 2-4 GM/100ML-% IV SOLN
2.0000 g | INTRAVENOUS | Status: AC
  Administered 2021-01-21: 2 g via INTRAVENOUS

## 2021-01-21 MED ORDER — MENTHOL 3 MG MT LOZG: 1.0000 | LOZENGE | OROMUCOSAL | Status: DC | PRN

## 2021-01-21 MED ORDER — PHENYLEPHRINE HCL-NACL 20-0.9 MG/250ML-% IV SOLN
INTRAVENOUS | Status: DC | PRN
  Administered 2021-01-21: 30 ug/min via INTRAVENOUS

## 2021-01-21 MED ORDER — TRANEXAMIC ACID-NACL 1000-0.7 MG/100ML-% IV SOLN
INTRAVENOUS | Status: AC
  Filled 2021-01-21: qty 100

## 2021-01-21 MED ORDER — ORAL CARE MOUTH RINSE: 15.0000 mL | Freq: Once | OROMUCOSAL | Status: AC

## 2021-01-21 MED ORDER — CHLORHEXIDINE GLUCONATE 0.12 % MT SOLN: 15.0000 mL | Freq: Once | OROMUCOSAL | Status: AC

## 2021-01-21 MED ORDER — ESMOLOL HCL 100 MG/10ML IV SOLN
INTRAVENOUS | Status: AC
  Filled 2021-01-21: qty 10

## 2021-01-21 MED ORDER — FENTANYL CITRATE (PF) 100 MCG/2ML IJ SOLN
25.0000 ug | INTRAMUSCULAR | Status: DC | PRN
  Administered 2021-01-21 (×3): 25 ug via INTRAVENOUS

## 2021-01-21 MED ORDER — ONDANSETRON HCL 4 MG/2ML IJ SOLN
INTRAMUSCULAR | Status: AC
  Filled 2021-01-21: qty 2

## 2021-01-21 MED ORDER — ACETAMINOPHEN 160 MG/5ML PO SOLN: 1000.0000 mg | Freq: Once | ORAL | Status: DC | PRN

## 2021-01-21 MED ORDER — CHLORHEXIDINE GLUCONATE 0.12 % MT SOLN
OROMUCOSAL | Status: AC
  Administered 2021-01-21: 15 mL via OROMUCOSAL
  Filled 2021-01-21: qty 15

## 2021-01-21 MED ORDER — ESMOLOL HCL 100 MG/10ML IV SOLN
INTRAVENOUS | Status: DC | PRN
  Administered 2021-01-21: 20 mg via INTRAVENOUS

## 2021-01-21 MED ORDER — ACETAMINOPHEN 10 MG/ML IV SOLN: 1000.0000 mg | Freq: Once | INTRAVENOUS | Status: DC | PRN

## 2021-01-21 MED ORDER — PROPOFOL 10 MG/ML IV BOLUS
INTRAVENOUS | Status: DC | PRN
  Administered 2021-01-21: 40 mg via INTRAVENOUS
  Administered 2021-01-21: 20 mg via INTRAVENOUS

## 2021-01-21 MED ORDER — GLUCERNA SHAKE PO LIQD
237.0000 mL | Freq: Three times a day (TID) | ORAL | Status: DC
  Administered 2021-01-24 – 2021-01-27 (×9): 237 mL via ORAL

## 2021-01-21 MED ORDER — ROCURONIUM BROMIDE 10 MG/ML (PF) SYRINGE
PREFILLED_SYRINGE | INTRAVENOUS | Status: DC | PRN
  Administered 2021-01-21: 70 mg via INTRAVENOUS

## 2021-01-21 MED ORDER — FENTANYL CITRATE (PF) 250 MCG/5ML IJ SOLN
INTRAMUSCULAR | Status: DC | PRN
  Administered 2021-01-21: 50 ug via INTRAVENOUS
  Administered 2021-01-21: 100 ug via INTRAVENOUS

## 2021-01-21 MED ORDER — DOCUSATE SODIUM 100 MG PO CAPS
100.0000 mg | ORAL_CAPSULE | Freq: Two times a day (BID) | ORAL | Status: DC
  Administered 2021-01-21: 100 mg via ORAL
  Filled 2021-01-21 (×2): qty 1

## 2021-01-21 MED ORDER — METHOCARBAMOL 1000 MG/10ML IJ SOLN
500.0000 mg | Freq: Four times a day (QID) | INTRAVENOUS | Status: DC | PRN
  Filled 2021-01-21: qty 5

## 2021-01-21 MED ORDER — ONDANSETRON HCL 4 MG/2ML IJ SOLN
4.0000 mg | Freq: Four times a day (QID) | INTRAMUSCULAR | Status: DC | PRN
  Administered 2021-01-22 – 2021-01-26 (×5): 4 mg via INTRAVENOUS
  Filled 2021-01-21 (×6): qty 2

## 2021-01-21 MED ORDER — ACETAMINOPHEN 325 MG PO TABS
325.0000 mg | ORAL_TABLET | Freq: Four times a day (QID) | ORAL | Status: DC | PRN
  Administered 2021-01-23 – 2021-01-25 (×3): 650 mg via ORAL
  Filled 2021-01-21 (×3): qty 2

## 2021-01-21 MED ORDER — ONDANSETRON HCL 4 MG PO TABS
4.0000 mg | ORAL_TABLET | Freq: Four times a day (QID) | ORAL | Status: DC | PRN
  Administered 2021-01-25: 4 mg via ORAL
  Filled 2021-01-21: qty 1

## 2021-01-21 MED ORDER — PHENYLEPHRINE 40 MCG/ML (10ML) SYRINGE FOR IV PUSH (FOR BLOOD PRESSURE SUPPORT)
PREFILLED_SYRINGE | INTRAVENOUS | Status: AC
  Filled 2021-01-21: qty 10

## 2021-01-21 MED ORDER — ACETAMINOPHEN 500 MG PO TABS: 1000.0000 mg | ORAL_TABLET | Freq: Once | ORAL | Status: DC | PRN

## 2021-01-21 MED ORDER — LACTATED RINGERS IV SOLN: INTRAVENOUS | Status: DC

## 2021-01-21 MED ORDER — POVIDONE-IODINE 10 % EX SWAB: 2.0000 "application " | Freq: Once | CUTANEOUS | Status: DC

## 2021-01-21 MED ORDER — FENTANYL CITRATE (PF) 100 MCG/2ML IJ SOLN
INTRAMUSCULAR | Status: AC
  Administered 2021-01-21: 25 ug via INTRAVENOUS
  Filled 2021-01-21: qty 2

## 2021-01-21 MED ORDER — CEFAZOLIN SODIUM-DEXTROSE 2-4 GM/100ML-% IV SOLN
2.0000 g | Freq: Four times a day (QID) | INTRAVENOUS | Status: AC
  Administered 2021-01-21 – 2021-01-22 (×2): 2 g via INTRAVENOUS
  Filled 2021-01-21 (×3): qty 100

## 2021-01-21 MED ORDER — SORBITOL 70 % SOLN
30.0000 mL | Freq: Every day | Status: DC | PRN
  Filled 2021-01-21: qty 30

## 2021-01-21 SURGICAL SUPPLY — 46 items
BAG COUNTER SPONGE SURGICOUNT (BAG) ×2 IMPLANT
BAG SPNG CNTER NS LX DISP (BAG) ×1
BIT DRILL INTERTAN LAG SCREW (BIT) ×1 IMPLANT
BIT DRILL SHORT 4.0 (BIT) IMPLANT
BNDG COHESIVE 4X5 TAN STRL (GAUZE/BANDAGES/DRESSINGS) ×1 IMPLANT
BNDG COHESIVE 6X5 TAN STRL LF (GAUZE/BANDAGES/DRESSINGS) ×2 IMPLANT
BNDG GAUZE ELAST 4 BULKY (GAUZE/BANDAGES/DRESSINGS) ×2 IMPLANT
COVER PERINEAL POST (MISCELLANEOUS) ×2 IMPLANT
COVER SURGICAL LIGHT HANDLE (MISCELLANEOUS) ×2 IMPLANT
DRAPE C-ARMOR (DRAPES) ×2 IMPLANT
DRAPE STERI IOBAN 125X83 (DRAPES) ×2 IMPLANT
DRILL BIT SHORT 4.0 (BIT) ×2
DRSG MEPILEX BORDER 4X4 (GAUZE/BANDAGES/DRESSINGS) ×4 IMPLANT
DRSG MEPILEX BORDER 4X8 (GAUZE/BANDAGES/DRESSINGS) ×1 IMPLANT
DRSG PAD ABDOMINAL 8X10 ST (GAUZE/BANDAGES/DRESSINGS) ×2 IMPLANT
DURAPREP 26ML APPLICATOR (WOUND CARE) ×2 IMPLANT
ELECT REM PT RETURN 9FT ADLT (ELECTROSURGICAL) ×2
ELECTRODE REM PT RTRN 9FT ADLT (ELECTROSURGICAL) ×1 IMPLANT
GLOVE SURG LTX SZ7 (GLOVE) ×1 IMPLANT
GLOVE SURG PROTEXIS BL SZ6.5 (GLOVE) ×2 IMPLANT
GLOVE SURG SYN 6.5 PF PI BL (GLOVE) IMPLANT
GLOVE SURG UNDER POLY LF SZ7 (GLOVE) ×24 IMPLANT
GLOVE SURG UNDER POLY LF SZ7.5 (GLOVE) ×7 IMPLANT
GOWN STRL REIN XL XLG (GOWN DISPOSABLE) ×3 IMPLANT
GUIDE PIN 3.2X343 (PIN) ×2
GUIDE PIN 3.2X343MM (PIN) ×4
KIT BASIN OR (CUSTOM PROCEDURE TRAY) ×2 IMPLANT
KIT TURNOVER KIT B (KITS) ×2 IMPLANT
MANIFOLD NEPTUNE II (INSTRUMENTS) ×1 IMPLANT
NAIL TRIGEN LEFT 10X38-125 (Nail) ×1 IMPLANT
NS IRRIG 1000ML POUR BTL (IV SOLUTION) ×2 IMPLANT
PACK GENERAL/GYN (CUSTOM PROCEDURE TRAY) ×2 IMPLANT
PAD ARMBOARD 7.5X6 YLW CONV (MISCELLANEOUS) ×10 IMPLANT
PAD CAST 4YDX4 CTTN HI CHSV (CAST SUPPLIES) ×2 IMPLANT
PADDING CAST COTTON 4X4 STRL (CAST SUPPLIES)
PIN GUIDE 3.2X343MM (PIN) IMPLANT
SCREW LAG COMPR KIT 85/80 (Screw) ×1 IMPLANT
SCREW TRIGEN LOW PROF 5.0X40 (Screw) ×1 IMPLANT
STAPLER VISISTAT 35W (STAPLE) ×2 IMPLANT
SUT VIC AB 0 CT1 27 (SUTURE) ×2
SUT VIC AB 0 CT1 27XBRD ANBCTR (SUTURE) ×1 IMPLANT
SUT VIC AB 2-0 CT1 27 (SUTURE) ×2
SUT VIC AB 2-0 CT1 TAPERPNT 27 (SUTURE) ×1 IMPLANT
TOWEL GREEN STERILE (TOWEL DISPOSABLE) ×1 IMPLANT
TOWEL GREEN STERILE FF (TOWEL DISPOSABLE) ×2 IMPLANT
WATER STERILE IRR 1000ML POUR (IV SOLUTION) ×2 IMPLANT

## 2021-01-21 NOTE — ED Notes (Signed)
MD aware of pt's BP and requests fluids rate increase to 184mL/hr.

## 2021-01-21 NOTE — Anesthesia Procedure Notes (Signed)
Procedure Name: Intubation Date/Time: 01/21/2021 9:18 AM Performed by: Inda Coke, CRNA Pre-anesthesia Checklist: Patient identified, Emergency Drugs available, Suction available and Patient being monitored Patient Re-evaluated:Patient Re-evaluated prior to induction Oxygen Delivery Method: Circle system utilized Preoxygenation: Pre-oxygenation with 100% oxygen Induction Type: IV induction Ventilation: Mask ventilation without difficulty Laryngoscope Size: Mac and 3 Grade View: Grade I Tube type: Oral Tube size: 7.0 mm Number of attempts: 1 Airway Equipment and Method: Stylet Placement Confirmation: ETT inserted through vocal cords under direct vision, positive ETCO2 and breath sounds checked- equal and bilateral Secured at: 21 cm Tube secured with: Tape Dental Injury: Teeth and Oropharynx as per pre-operative assessment

## 2021-01-21 NOTE — Progress Notes (Signed)
PROGRESS NOTE    [name] [name]  ZOX:[id number] DOB: 12-11-1929 DOA: 01/20/2021 PCP: Ginger Organ., MD  Brief Narrative: 87/F with medical history of dementia, CKD 4, diabetes mellitus, hypertension, chronic diastolic CHF, left lower extremity DVT on anticoagulation -Presented to the ED after mechanical fall, and history of left hip/knee pain -Work-up in the ED was notable for left fracture, creatinine up to 3.2 from baseline around 2.1  Assessment & Plan:     Closed left hip fracture, initial encounter [facility]) -Orthopedics consulting, plan for OR today -Eliquis on hold resume when okay with Ortho -PT eval tomorrow -Anticipate need for short-term  AKI on CKD4 -Baseline creatinine is 2.1, creatinine 2.2 on admission -Continue IV fluids today, hold Lasix and ARB  Type 2 diabetes mellitus -On hypoglycemics on hold, holding Toujeo, continue sliding scale insulin  Hypertension -BP soft overnight, holding hydralazine, clonidine, ARB and Lasix -Resume beta-blockers for stroke blood pressure  Chronic diastolic CHF Mild pulmonary hypertension -Monitor volume status closely, on IV fluids for AKI now  History of DVT -Eliquis on hold  Anxiety, depression -Regimen of Valium received  History of dementia -Delirium precautions  DVT prophylaxis: SCDs Code Status: Full code Family Communication: No family at bedside Disposition Plan:  Status is: Inpatient  Remains inpatient appropriate because: Severity of illness   Consultants:  Orthopedics Dr.Xu  Procedures:   Antimicrobials:    Subjective: -Complains of hip pain  Objective: Vitals:   01/21/21 0600 01/21/21 0700 01/21/21 0851 01/21/21 1026  BP: (!) 121/48 (!) 115/52 (!) 168/62 (!) 103/45  Pulse: 63 65 70 64  Resp: 20 20 20  (!) 25  Temp:  98.2 F (36.8 C) 98.1 F (36.7 C) (!) 97.3 F (36.3 C)  TempSrc:  Oral Oral   SpO2: 94% 96% 99% 100%    Intake/Output Summary (Last 24 hours) at 01/21/2021 1053 Last  data filed at 01/21/2021 1021 Gross per 24 hour  Intake 800 ml  Output 650 ml  Net 150 ml   There were no vitals filed for this visit.  Examination:  Gen: Pleasant 87 elderly female, laying in bed, awake alert, oriented to self and partly to place only, cognitive deficits HEENT: no JVD Lungs: Clear anteriorly CVS: S1S2/RRR Abd: soft, Non tender, non distended, BS present Extremities: Left hip tenderness to palpation, no edema Skin: no new rashes on exposed skin  Psychiatry: Poor insight  Data Reviewed:   CBC: Recent Labs  Lab 01/20/21 1823  WBC 13.4*  NEUTROABS 10.8*  HGB 12.2  HCT 38.1  MCV 93.4  PLT 811   Basic Metabolic Panel: Recent Labs  Lab 01/20/21 1823 01/21/21 0329  NA 135 136  K 4.2 3.8  CL 103 105  CO2 19* 21*  GLUCOSE 253* 195*  BUN 46* 42*  CREATININE 3.27* 2.88*  CALCIUM 10.6* 9.9   GFR: CrCl cannot be calculated (Unknown ideal weight.). Liver Function Tests: Recent Labs  Lab 01/20/21 1823  AST 29  ALT 20  ALKPHOS 81  BILITOT 1.4*  PROT 6.8  ALBUMIN 3.8   No results for input(s): LIPASE, AMYLASE in the last 168 hours. No results for input(s): AMMONIA in the last 168 hours. Coagulation Profile: Recent Labs  Lab 01/20/21 1823  INR 1.3*   Cardiac Enzymes: Recent Labs  Lab 01/20/21 2214  CKTOTAL 112   BNP (last 3 results) No results for input(s): PROBNP in the last 8760 hours. HbA1C: Recent Labs    01/20/21 2214  HGBA1C 7.9*   CBG: Recent  Labs  Lab 01/20/21 2216 01/21/21 0029 01/21/21 0417 01/21/21 0750 01/21/21 1027  GLUCAP 246* 180* 177* 197* 213*   Lipid Profile: No results for input(s): CHOL, HDL, LDLCALC, TRIG, CHOLHDL, LDLDIRECT in the last 72 hours. Thyroid Function Tests: No results for input(s): TSH, T4TOTAL, FREET4, T3FREE, THYROIDAB in the last 72 hours. Anemia Panel: No results for input(s): VITAMINB12, FOLATE, FERRITIN, TIBC, IRON, RETICCTPCT in the last 72 hours. Urine analysis:    Component Value  Date/Time   COLORURINE YELLOW 06/24/2020 [place] 06/24/2020 1328   LABSPEC 1.006 06/24/2020 1328   PHURINE 5.0 06/24/2020 1328   GLUCOSEU NEGATIVE 06/24/2020 1328   HGBUR NEGATIVE 06/24/2020 [place] 06/24/2020 [place] 06/24/2020 1328   PROTEINUR NEGATIVE 06/24/2020 1328   UROBILINOGEN 1.0 07/25/2009 1045   NITRITE NEGATIVE 06/24/2020 1328   LEUKOCYTESUR MODERATE (A) 06/24/2020 1328   Sepsis Labs: @LABRCNTIP (procalcitonin:4,lacticidven:4)  ) Recent Results (from the past 240 hour(s))  Resp Panel by RT-PCR (Flu A&B, Covid) Nasopharyngeal Swab     Status: None   Collection Time: 01/20/21  6:24 PM   Specimen: Nasopharyngeal Swab; Nasopharyngeal(NP) swabs in vial transport medium  Result Value Ref Range Status   SARS Coronavirus 2 by RT PCR NEGATIVE NEGATIVE Final    Comment: (NOTE) SARS-CoV-2 target nucleic acids are NOT DETECTED.  The SARS-CoV-2 RNA is generally detectable in upper respiratory specimens during the acute phase of infection. The lowest concentration of SARS-CoV-2 viral copies this assay can detect is 138 copies/mL. A negative result does not preclude SARS-Cov-2 infection and should not be used as the sole basis for treatment or other patient management decisions. A negative result may occur with  improper specimen collection/handling, submission of specimen other than nasopharyngeal swab, presence of viral mutation(s) within the areas targeted by this assay, and inadequate number of viral copies(<138 copies/mL). A negative result must be combined with clinical observations, patient history, and epidemiological information. The expected result is Negative.  Fact Sheet for Patients:  EntrepreneurPulse.com.au  Fact Sheet for Healthcare Providers:  IncredibleEmployment.be  This test is no t yet approved or cleared by the [place] FDA and  has been authorized for  detection and/or diagnosis of SARS-CoV-2 by FDA under an Emergency Use Authorization (EUA). This EUA will remain  in effect (meaning this test can be used) for the duration of the COVID-19 declaration under Section 564(b)(1) of the Act, 21 U.S.C.section 360bbb-3(b)(1), unless the authorization is terminated  or revoked sooner.       Influenza A by PCR NEGATIVE NEGATIVE Final   Influenza B by PCR NEGATIVE NEGATIVE Final    Comment: (NOTE) The Xpert Xpress SARS-CoV-2/FLU/RSV plus assay is intended as an aid in the diagnosis of influenza from Nasopharyngeal swab specimens and should not be used as a sole basis for treatment. Nasal washings and aspirates are unacceptable for Xpert Xpress SARS-CoV-2/FLU/RSV testing.  Fact Sheet for Patients: EntrepreneurPulse.com.au  Fact Sheet for Healthcare Providers: IncredibleEmployment.be  This test is not yet approved or cleared by the [place] FDA and has been authorized for detection and/or diagnosis of SARS-CoV-2 by FDA under an Emergency Use Authorization (EUA). This EUA will remain in effect (meaning this test can be used) for the duration of the COVID-19 declaration under Section 564(b)(1) of the Act, 21 U.S.C. section 360bbb-3(b)(1), unless the authorization is terminated or revoked.  Performed at [facility], [place] [address], [place], [place] [address]   Surgical pcr screen  Status: None   Collection Time: 01/21/21  8:36 AM   Specimen: Nasal Mucosa; Nasal Swab  Result Value Ref Range Status   MRSA, PCR NEGATIVE NEGATIVE Final   Staphylococcus aureus NEGATIVE NEGATIVE Final    Comment: (NOTE) The Xpert SA Assay (FDA approved for NASAL specimens in patients 80 years of age and older), is one component of a comprehensive surveillance program. It is not intended to diagnose infection nor to guide or monitor treatment. Performed at [facility], [place] [address], [place],  [place] [address]          Radiology Studies: DG Chest 1 View  Result Date: 01/20/2021 CLINICAL DATA:  Status post fall.  Hip fracture. EXAM: CHEST  1 VIEW COMPARISON:  Chest x-ray 01/07/2021 FINDINGS: The heart and mediastinal contours are unchanged. Aortic calcification. No focal consolidation. No pulmonary edema. No pleural effusion. No pneumothorax. No acute osseous abnormality. IMPRESSION: No active disease. Electronically Signed   By: Iven Finn M.D.   On: 01/20/2021 19:11   CT HEAD WO CONTRAST (5MM)  Result Date: 01/20/2021 CLINICAL DATA:  Fall, headache EXAM: CT HEAD WITHOUT CONTRAST CT CERVICAL SPINE WITHOUT CONTRAST TECHNIQUE: Multidetector CT imaging of the head and cervical spine was performed following the standard protocol without intravenous contrast. Multiplanar CT image reconstructions of the cervical spine were also generated. COMPARISON:  None. FINDINGS: CT HEAD FINDINGS BRAIN: BRAIN Cerebral ventricle sizes are concordant with the degree of cerebral volume loss. Patchy and confluent areas of decreased attenuation are noted throughout the deep and periventricular white matter of the cerebral hemispheres bilaterally, compatible with chronic microvascular ischemic disease. No evidence of large-territorial acute infarction. No parenchymal hemorrhage. No mass lesion. No extra-axial collection. No mass effect or midline shift. No hydrocephalus. Basilar cisterns are patent. Vascular: No hyperdense vessel. Atherosclerotic calcifications are present within the cavernous internal carotid arteries. Skull: No acute fracture or focal lesion. Sinuses/Orbits: Paranasal sinuses and mastoid air cells are clear. Bilateral lens replacement. Otherwise the orbits are unremarkable. Other: None. CT CERVICAL SPINE FINDINGS Alignment: Normal. Skull base and vertebrae: Multilevel degenerative changes of the spine. No definite severe osseous neural foraminal or central canal stenosis. No acute fracture. No  aggressive appearing focal osseous lesion or focal pathologic process. Soft tissues and spinal canal: No prevertebral fluid or swelling. No visible canal hematoma. Upper chest: Unremarkable. Other: None. IMPRESSION: 1. No acute intracranial abnormality. 2. No acute displaced fracture or traumatic listhesis of the cervical spine. Electronically Signed   By: Iven Finn M.D.   On: 01/20/2021 21:10   CT Cervical Spine Wo Contrast  Result Date: 01/20/2021 CLINICAL DATA:  Fall, headache EXAM: CT HEAD WITHOUT CONTRAST CT CERVICAL SPINE WITHOUT CONTRAST TECHNIQUE: Multidetector CT imaging of the head and cervical spine was performed following the standard protocol without intravenous contrast. Multiplanar CT image reconstructions of the cervical spine were also generated. COMPARISON:  None. FINDINGS: CT HEAD FINDINGS BRAIN: BRAIN Cerebral ventricle sizes are concordant with the degree of cerebral volume loss. Patchy and confluent areas of decreased attenuation are noted throughout the deep and periventricular white matter of the cerebral hemispheres bilaterally, compatible with chronic microvascular ischemic disease. No evidence of large-territorial acute infarction. No parenchymal hemorrhage. No mass lesion. No extra-axial collection. No mass effect or midline shift. No hydrocephalus. Basilar cisterns are patent. Vascular: No hyperdense vessel. Atherosclerotic calcifications are present within the cavernous internal carotid arteries. Skull: No acute fracture or focal lesion. Sinuses/Orbits: Paranasal sinuses and mastoid air cells are clear. Bilateral lens  replacement. Otherwise the orbits are unremarkable. Other: None. CT CERVICAL SPINE FINDINGS Alignment: Normal. Skull base and vertebrae: Multilevel degenerative changes of the spine. No definite severe osseous neural foraminal or central canal stenosis. No acute fracture. No aggressive appearing focal osseous lesion or focal pathologic process. Soft tissues and  spinal canal: No prevertebral fluid or swelling. No visible canal hematoma. Upper chest: Unremarkable. Other: None. IMPRESSION: 1. No acute intracranial abnormality. 2. No acute displaced fracture or traumatic listhesis of the cervical spine. Electronically Signed   By: Iven Finn M.D.   On: 01/20/2021 21:10   CT Knee Left Wo Contrast  Result Date: 01/20/2021 CLINICAL DATA:  Knee trauma, tenderness or effusion EXAM: CT OF THE  KNEE WITHOUT CONTRAST TECHNIQUE: Multidetector CT imaging of the left knee was performed according to the standard protocol. Multiplanar CT image reconstructions were also generated. COMPARISON:  X-ray left knee 01/20/2021 FINDINGS: Bones/Joint/Cartilage Moderate to severe tricompartmental degenerative changes of the knee. Question associated chondrocalcinosis. Slight medial subluxation of the femur in relation to the patella and tibia likely due to degenerative changes. No acute displaced fracture or dislocation. Trace associated joint effusion. Ligaments Suboptimally assessed by CT. Muscles and Tendons Grossly unremarkable. Soft tissues Unremarkable.  Vascular calcifications. IMPRESSION: No acute displaced fracture or dislocation in a patient with moderate to severe tricompartmental degenerative changes. Slight medial subluxation of the femur in relation to the patella and tibia likely due to degenerative changes. Electronically Signed   By: Iven Finn M.D.   On: 01/20/2021 21:25   DG Knee Complete 4 Views Left  Result Date: 01/20/2021 CLINICAL DATA:  Fall, left knee pain EXAM: LEFT KNEE - COMPLETE 4+ VIEW COMPARISON:  None. FINDINGS: Moderate tricompartmental degenerative changes. Chondrocalcinosis in the medial and lateral compartments. No fracture or dislocation is seen. Visualized soft tissues are within normal limits. No suprapatellar knee joint effusion. IMPRESSION: Moderate degenerative changes, as above. Electronically Signed   By: Julian Hy M.D.   On:  01/20/2021 19:22   DG C-Arm 1-60 Min-No Report  Result Date: 01/21/2021 Fluoroscopy was utilized by the requesting physician.  No radiographic interpretation.   DG Hip Unilat With Pelvis 2-3 Views Left  Result Date: 01/20/2021 CLINICAL DATA:  Fall, left hip pain EXAM: DG HIP (WITH OR WITHOUT PELVIS) 2-3V LEFT COMPARISON:  None. FINDINGS: Intertrochanteric left hip fracture, mildly displaced. Right hip is intact. Visualized bony pelvis appears intact. Lumbar spine fixation hardware, incompletely visualized. IMPRESSION: Intertrochanteric left hip fracture, mildly displaced. Electronically Signed   By: Julian Hy M.D.   On: 01/20/2021 19:22   DG FEMUR MIN 2 VIEWS LEFT  Result Date: 01/21/2021 CLINICAL DATA:  Surgery, elective Z41.9 (ICD-10-CM) EXAM: LEFT FEMUR 2 VIEWS COMPARISON:  January 20, 2021. FINDINGS: Fluoro time: 1 minute 28 seconds. Reported radiation dose: 6.82 mGy. Five C-arm fluoroscopic images were obtained intraoperatively and submitted for post operative interpretation. These images demonstrate intramedullary rod and screw fixation a intertrochanteric left femur fracture. Improved alignment, near anatomic. Please see the performing provider's procedural report for further detail. IMPRESSION: Intraoperative fluoroscopy, as detailed above. Electronically Signed   By: Margaretha Sheffield M.D.   On: 01/21/2021 10:28        Scheduled Meds:  chlorhexidine       [START ON 01/22/2021] feeding supplement (GLUCERNA SHAKE)  237 mL Oral TID BM   [MAR Hold] insulin aspart  0-9 Units Subcutaneous Q4H   [MAR Hold] metoprolol tartrate  25 mg Oral BID   [START ON 01/22/2021] multivitamin with minerals  1 tablet Oral Daily   [MAR Hold] pantoprazole  40 mg Oral QHS   povidone-iodine  2 application Topical Once   [MAR Hold] simvastatin  40 mg Oral QPM   tranexamic acid (CYKLOKAPRON) topical - INTRAOP  2,000 mg Topical To OR   Continuous Infusions:  lactated ringers 75 mL/hr at 01/21/21  0804   lactated ringers 10 mL/hr at 01/21/21 2229   tranexamic acid     tranexamic acid       LOS: 1 day    Time spent: 22min  Domenic Polite, MD [facility]   01/21/2021, 10:53 AM

## 2021-01-21 NOTE — Consult Note (Signed)
ORTHOPAEDIC CONSULTATION  REQUESTING PHYSICIAN: Domenic Polite, MD  Chief Complaint: Left intertrochanteric fracture  HPI: [name] [name] is a 87 year old female with dementia who suffered a ground level fall at home.  Has severe pain in the left hip with inability to weight bear.   Ortho consulted for surgical evaluation.  Past Medical History:  Diagnosis Date   Allergic rhinitis, cause unspecified    Asthma    Esophageal reflux    Sleep apnea    on C-pap- Dr young follows   Syncope 2019   Normal LVF, negative monitor   Type II or unspecified type diabetes mellitus without mention of complication, not stated as uncontrolled    Unspecified essential hypertension    mild LVH, normal LVF, grade 2 DD by echo 2019   Past Surgical History:  Procedure Laterality Date   APPENDECTOMY     FIXATION KYPHOPLASTY LUMBAR SPINE     HIATAL HERNIA REPAIR     LUMBAR SPINE SURGERY     TUBAL LIGATION     VAGINAL HYSTERECTOMY     Social History   Socioeconomic History   Marital status: Married    Spouse name: Not on file   Number of children: 4   Years of education: Not on file   Highest education level: 10th grade  Occupational History    Comment: [employer]  Tobacco Use   Smoking status: Never   Smokeless tobacco: Never  Vaping Use   Vaping Use: Never used  Substance and Sexual Activity   Alcohol use: Never   Drug use: Never   Sexual activity: Not Currently  Other Topics Concern   Not on file  Social History Narrative   06/29/20 lives with husband who had 2 strokes, caregiver 3 hours every day of week in mornings   Social Determinants of Health   Financial Resource Strain: Not on file  Food Insecurity: Not on file  Transportation Needs: Not on file  Physical Activity: Not on file  Stress: Not on file  Social Connections: Not on file   Family History  Problem Relation Age of Onset   Lung cancer Brother    Diabetes Mother    Allergies  Allergen Reactions   Aspirin Nausea And  Vomiting   Codeine Nausea And Vomiting   Ibuprofen Nausea And Vomiting   Meperidine Hcl    Tape     ADHESIVE TAPE CAUSES BLISTERS   Augmentin [Amoxicillin-Pot Clavulanate]     GI upset   Prior to Admission medications   Medication Sig Start Date End Date Taking? Authorizing Provider  acetaminophen (TYLENOL) 500 MG tablet Take 1,000 mg by mouth every 6 (six) hours as needed for moderate pain or headache.   Yes [provider]  albuterol (PROAIR HFA) 108 (90 Base) MCG/ACT inhaler 2 puffs every 4-6 hours as directed- rescue Patient taking differently: Inhale 2 puffs into the lungs every 6 (six) hours as needed for wheezing or shortness of breath. 06/27/18  Yes Young, Tarri Fuller D, MD  albuterol (PROVENTIL) (2.5 MG/3ML) 0.083% nebulizer solution Take 3 mLs (2.5 mg total) by nebulization every 6 (six) hours as needed for wheezing or shortness of breath. DX Q65.784 06/27/18  Yes Young, Tarri Fuller D, MD  BD PEN NEEDLE NANO U/F 32G X 4 MM MISC USE TO INJECT INSULIN ONCE D 01/16/18  Yes [provider]  Blood Glucose Monitoring Suppl (ONE TOUCH ULTRA SYSTEM KIT) W/DEVICE KIT 1 kit by Does not apply route once.   Yes [provider]  chlorpheniramine-HYDROcodone (TUSSIONEX PENNKINETIC ER) 10-8 MG/5ML SUER Take 5 mLs by mouth every 12 (twelve) hours as needed for cough. 01/07/21  Yes Lacretia Leigh, MD  cloNIDine (CATAPRES) 0.1 MG tablet Take 0.1 mg by mouth 2 (two) times daily.   Yes [provider]  diazepam (VALIUM) 5 MG tablet Take 5 mg by mouth every 12 (twelve) hours as needed for anxiety.   Yes [provider]  ELIQUIS 2.5 MG TABS tablet Take 2.5 mg by mouth 2 (two) times daily. 08/09/20  Yes [provider]  furosemide (LASIX) 40 MG tablet Take 40 mg by mouth daily as needed for fluid or edema.   Yes [provider]  hydrALAZINE (APRESOLINE) 50 MG tablet Take 50 mg by mouth 2 (two) times daily. 11/27/20  Yes [provider]  Lancets  MISC by Does not apply route daily.   Yes [provider]  loratadine (CLARITIN) 10 MG tablet Take 10 mg by mouth daily as needed for allergies.   Yes [provider]  metoprolol (LOPRESSOR) 50 MG tablet Take 25 mg by mouth 2 (two) times daily. 01/29/13  Yes [provider]  omeprazole (PRILOSEC) 20 MG capsule Take 20 mg by mouth every evening.   Yes [provider]  ONE TOUCH ULTRA TEST test strip Checks blood sugar 3 times daily 06/21/14  Yes [provider]  simvastatin (ZOCOR) 40 MG tablet Take 40 mg by mouth every evening. 01/15/18  Yes [provider]  telmisartan (MICARDIS) 80 MG tablet Take 80 mg by mouth daily. 07/18/13  Yes [provider]  TOUJEO SOLOSTAR 300 UNIT/ML SOPN Inject 10 Units into the skin at bedtime. 11/17/17  Yes [provider]  traMADol (ULTRAM) 50 MG tablet Take 1 tablet (50 mg total) by mouth every 6 (six) hours as needed. Patient taking differently: Take 50 mg by mouth every 6 (six) hours as needed for moderate pain. 07/08/17  Yes Montine Circle, PA-C   DG Chest 1 View  Result Date: 01/20/2021 CLINICAL DATA:  Status post fall.  Hip fracture. EXAM: CHEST  1 VIEW COMPARISON:  Chest x-ray 01/07/2021 FINDINGS: The heart and mediastinal contours are unchanged. Aortic calcification. No focal consolidation. No pulmonary edema. No pleural effusion. No pneumothorax. No acute osseous abnormality. IMPRESSION: No active disease. Electronically Signed   By: Iven Finn M.D.   On: 01/20/2021 19:11   CT HEAD WO CONTRAST (5MM)  Result Date: 01/20/2021 CLINICAL DATA:  Fall, headache EXAM: CT HEAD WITHOUT CONTRAST CT CERVICAL SPINE WITHOUT CONTRAST TECHNIQUE: Multidetector CT imaging of the head and cervical spine was performed following the standard protocol without intravenous contrast. Multiplanar CT image reconstructions of the cervical spine were also generated. COMPARISON:  None. FINDINGS: CT HEAD FINDINGS  BRAIN: BRAIN Cerebral ventricle sizes are concordant with the degree of cerebral volume loss. Patchy and confluent areas of decreased attenuation are noted throughout the deep and periventricular white matter of the cerebral hemispheres bilaterally, compatible with chronic microvascular ischemic disease. No evidence of large-territorial acute infarction. No parenchymal hemorrhage. No mass lesion. No extra-axial collection. No mass effect or midline shift. No hydrocephalus. Basilar cisterns are patent. Vascular: No hyperdense vessel. Atherosclerotic calcifications are present within the cavernous internal carotid arteries. Skull: No acute fracture or focal lesion. Sinuses/Orbits: Paranasal sinuses and mastoid air cells are clear. Bilateral lens replacement. Otherwise the orbits are unremarkable. Other: None. CT CERVICAL SPINE FINDINGS Alignment: Normal. Skull base and vertebrae: Multilevel degenerative changes of the spine. No definite severe osseous neural  foraminal or central canal stenosis. No acute fracture. No aggressive appearing focal osseous lesion or focal pathologic process. Soft tissues and spinal canal: No prevertebral fluid or swelling. No visible canal hematoma. Upper chest: Unremarkable. Other: None. IMPRESSION: 1. No acute intracranial abnormality. 2. No acute displaced fracture or traumatic listhesis of the cervical spine. Electronically Signed   By: Iven Finn M.D.   On: 01/20/2021 21:10   CT Cervical Spine Wo Contrast  Result Date: 01/20/2021 CLINICAL DATA:  Fall, headache EXAM: CT HEAD WITHOUT CONTRAST CT CERVICAL SPINE WITHOUT CONTRAST TECHNIQUE: Multidetector CT imaging of the head and cervical spine was performed following the standard protocol without intravenous contrast. Multiplanar CT image reconstructions of the cervical spine were also generated. COMPARISON:  None. FINDINGS: CT HEAD FINDINGS BRAIN: BRAIN Cerebral ventricle sizes are concordant with the degree of cerebral volume  loss. Patchy and confluent areas of decreased attenuation are noted throughout the deep and periventricular white matter of the cerebral hemispheres bilaterally, compatible with chronic microvascular ischemic disease. No evidence of large-territorial acute infarction. No parenchymal hemorrhage. No mass lesion. No extra-axial collection. No mass effect or midline shift. No hydrocephalus. Basilar cisterns are patent. Vascular: No hyperdense vessel. Atherosclerotic calcifications are present within the cavernous internal carotid arteries. Skull: No acute fracture or focal lesion. Sinuses/Orbits: Paranasal sinuses and mastoid air cells are clear. Bilateral lens replacement. Otherwise the orbits are unremarkable. Other: None. CT CERVICAL SPINE FINDINGS Alignment: Normal. Skull base and vertebrae: Multilevel degenerative changes of the spine. No definite severe osseous neural foraminal or central canal stenosis. No acute fracture. No aggressive appearing focal osseous lesion or focal pathologic process. Soft tissues and spinal canal: No prevertebral fluid or swelling. No visible canal hematoma. Upper chest: Unremarkable. Other: None. IMPRESSION: 1. No acute intracranial abnormality. 2. No acute displaced fracture or traumatic listhesis of the cervical spine. Electronically Signed   By: Iven Finn M.D.   On: 01/20/2021 21:10   CT Knee Left Wo Contrast  Result Date: 01/20/2021 CLINICAL DATA:  Knee trauma, tenderness or effusion EXAM: CT OF THE  KNEE WITHOUT CONTRAST TECHNIQUE: Multidetector CT imaging of the left knee was performed according to the standard protocol. Multiplanar CT image reconstructions were also generated. COMPARISON:  X-ray left knee 01/20/2021 FINDINGS: Bones/Joint/Cartilage Moderate to severe tricompartmental degenerative changes of the knee. Question associated chondrocalcinosis. Slight medial subluxation of the femur in relation to the patella and tibia likely due to degenerative changes. No  acute displaced fracture or dislocation. Trace associated joint effusion. Ligaments Suboptimally assessed by CT. Muscles and Tendons Grossly unremarkable. Soft tissues Unremarkable.  Vascular calcifications. IMPRESSION: No acute displaced fracture or dislocation in a patient with moderate to severe tricompartmental degenerative changes. Slight medial subluxation of the femur in relation to the patella and tibia likely due to degenerative changes. Electronically Signed   By: Iven Finn M.D.   On: 01/20/2021 21:25   DG Knee Complete 4 Views Left  Result Date: 01/20/2021 CLINICAL DATA:  Fall, left knee pain EXAM: LEFT KNEE - COMPLETE 4+ VIEW COMPARISON:  None. FINDINGS: Moderate tricompartmental degenerative changes. Chondrocalcinosis in the medial and lateral compartments. No fracture or dislocation is seen. Visualized soft tissues are within normal limits. No suprapatellar knee joint effusion. IMPRESSION: Moderate degenerative changes, as above. Electronically Signed   By: Julian Hy M.D.   On: 01/20/2021 19:22   DG Hip Unilat With Pelvis 2-3 Views Left  Result Date: 01/20/2021 CLINICAL DATA:  Fall, left hip pain EXAM: DG HIP (WITH OR WITHOUT  PELVIS) 2-3V LEFT COMPARISON:  None. FINDINGS: Intertrochanteric left hip fracture, mildly displaced. Right hip is intact. Visualized bony pelvis appears intact. Lumbar spine fixation hardware, incompletely visualized. IMPRESSION: Intertrochanteric left hip fracture, mildly displaced. Electronically Signed   By: Julian Hy M.D.   On: 01/20/2021 19:22    All pertinent xrays, MRI, CT independently reviewed and interpreted  Positive ROS: All other systems have been reviewed and were otherwise negative with the exception of those mentioned in the HPI and as above.  Physical Exam: General: No acute distress Cardiovascular: No pedal edema Respiratory: No cyanosis, no use of accessory musculature GI: No organomegaly, abdomen is soft and  non-tender Skin: No lesions in the area of chief complaint Neurologic: Sensation intact distally Psychiatric: Patient is at baseline mood and affect Lymphatic: No axillary or cervical lymphadenopathy  MUSCULOSKELETAL:  - severe pain with movement of the hip and extremity - skin intact - NVI distally - compartments soft  Assessment: Left intertrochanteric fracture  Plan: - surgical treatment is recommended for pain relief, quality of life and early mobilization - I spoke to daughter Katha Hamming, she is aware of r/b/a and wish to proceed, informed consent obtained - medical optimization per primary team - surgery is planned for today - anticipate SNF after hospital  Thank you for the consult and the opportunity to see [name] [name]  N. Eduard Roux, MD [facility] 7:37 AM

## 2021-01-21 NOTE — Progress Notes (Addendum)
0100 Hypotension with SBP in 70s.  Getting 1L bolus. Holding clonidine and hydralazine for AM.  0230 Update: BP now 132/98

## 2021-01-21 NOTE — Op Note (Signed)
   Date of Surgery: 01/21/2021  INDICATIONS: Ms. Slovacek is a 87 y.o.-year-old female who sustained a left hip fracture. The risks and benefits of the procedure discussed with the family prior to the procedure and all questions were answered; consent was obtained.  PREOPERATIVE DIAGNOSIS: left intertrochanteric hip fracture   POSTOPERATIVE DIAGNOSIS: Same   PROCEDURE: Open treatment of intertrochanteric fracture with intramedullary implant. CPT [id number]   SURGEON: N. Eduard Roux, M.D.   ASSIST: Madalyn Rob, [place]  ANESTHESIA: general   IV FLUIDS AND URINE: See anesthesia record   ESTIMATED BLOOD LOSS: 150 cc  IMPLANTS: [employer] InterTAN 10 x 38, 85/80 compression screws  DRAINS: None.   COMPLICATIONS: see description of procedure.   DESCRIPTION OF PROCEDURE: The patient was brought to the operating room and placed supine on the operating table. The patient's leg had been signed prior to the procedure. The patient had the anesthesia placed by the anesthesiologist. The prep verification and incision time-outs were performed to confirm that this was the correct patient, site, side and location. The patient had an SCD on the opposite lower extremity. The patient did receive antibiotics prior to the incision and was re-dosed during the procedure as needed at indicated intervals. The patient was positioned on the fracture table with the table in traction and internal rotation to reduce the hip. The well leg was placed in a scissor position and all bony prominences were well-padded. The patient had the lower extremity prepped and draped in the standard surgical fashion. The incision was made 4 finger breadths superior to the greater trochanter. A guide pin was inserted into the tip of the greater trochanter under fluoroscopic guidance. An opening reamer was used to gain access to the femoral canal. The nail length was measured and inserted down the femoral canal to its proper depth.  The appropriate version of insertion for the lag screw was found under fluoroscopy. A pin was inserted up the femoral neck through the jig. Then, a second antirotation pin was inserted inferior to the first pin. The length of the lag screw was then measured. The lag screw was inserted as near to center-center in the head as possible. The antirotation pin was then taken out and an interdigitating compression screw was placed in its place. The leg was taken out of traction, then the interdigitating compression screw was used to compress across the fracture. Compression was visualized on serial xrays.  A distal interlocking screw was placed using the perfect circle technique.  The wound was copiously irrigated with saline and the subcutaneous layer closed with 2.0 vicryl and the skin was reapproximated with staples. The wounds were cleaned and dried a final time and a sterile dressing was placed. The hip was taken through a range of motion at the end of the case under fluoroscopic imaging to visualize the approach-withdraw phenomenon and confirm implant length in the head. The patient was then awakened from anesthesia and taken to the recovery room in stable condition. All counts were correct at the end of the case.   Tawanna Cooler was necessary for opening, closing, retracting, limb positioning and overall facilitation and completion of the surgery.  POSTOPERATIVE PLAN: The patient will be weight bearing as tolerated and will return in 2 weeks for staple removal and the patient will receive DVT prophylaxis based on other medications, activity level, and risk ratio of bleeding to thrombosis.   Azucena Cecil, MD [facility] 10:04 AM

## 2021-01-21 NOTE — Anesthesia Preprocedure Evaluation (Addendum)
Anesthesia Evaluation  Patient identified by MRN, date of birth, ID band Patient awake    Reviewed: Allergy & Precautions, NPO status , Patient's Chart, lab work & pertinent test results, reviewed documented beta blocker date and time   History of Anesthesia Complications Negative for: history of anesthetic complications  Airway Mallampati: III  TM Distance: >3 FB Neck ROM: Full    Dental  (+) Upper Dentures, Lower Dentures   Pulmonary neg shortness of breath, asthma , sleep apnea and Continuous Positive Airway Pressure Ventilation , neg COPD, neg recent URI,    breath sounds clear to auscultation       Cardiovascular hypertension, Pt. on medications and Pt. on home beta blockers (-) angina Rhythm:Regular  - Left ventricle: The cavity size was normal. Wall thickness was  increased in a pattern of mild LVH. Systolic function was normal.  The estimated ejection fraction was in the range of 60% to 65%.  Wall motion was normal; there were no regional wall motion  abnormalities. Features are consistent with a pseudonormal left  ventricular filling pattern, with concomitant abnormal relaxation  and increased filling pressure (grade 2 diastolic dysfunction).  - Aortic valve: There was trivial regurgitation.  - Mitral valve: Mildly to moderately calcified annulus.  - Right atrium: The atrium was mildly dilated.  - Pulmonary arteries: Systolic pressure was mildly increased. PA  peak pressure: 33 mm Hg (S).    Neuro/Psych    GI/Hepatic Neg liver ROS, GERD  ,  Endo/Other  diabetes  Renal/GU CRFRenal disease     Musculoskeletal   Abdominal   Peds  Hematology   Anesthesia Other Findings   Reproductive/Obstetrics                            Anesthesia Physical Anesthesia Plan  ASA: 3  Anesthesia Plan: General   Post-op Pain Management:    Induction: Intravenous  PONV Risk Score and  Plan: 3 and Ondansetron and Dexamethasone  Airway Management Planned: Oral ETT  Additional Equipment: None  Intra-op Plan:   Post-operative Plan: Extubation in OR  Informed Consent: I have reviewed the patients History and Physical, chart, labs and discussed the procedure including the risks, benefits and alternatives for the proposed anesthesia with the patient or authorized representative who has indicated his/her understanding and acceptance.     Dental advisory given  Plan Discussed with: CRNA and Anesthesiologist  Anesthesia Plan Comments:        Anesthesia Quick Evaluation

## 2021-01-21 NOTE — ED Notes (Signed)
Provider at bedside talking with patient.

## 2021-01-21 NOTE — Anesthesia Postprocedure Evaluation (Signed)
Anesthesia Post Note  Patient: [name] [name]  Procedure(s) Performed: INTRAMEDULLARY (IM) NAIL INTERTROCHANTRIC (Left: Hip)     Patient location during evaluation: PACU Anesthesia Type: General Level of consciousness: patient cooperative and awake Pain management: pain level controlled Vital Signs Assessment: post-procedure vital signs reviewed and stable Respiratory status: spontaneous breathing, nonlabored ventilation, respiratory function stable and patient connected to nasal cannula oxygen Cardiovascular status: blood pressure returned to baseline and stable Postop Assessment: no apparent nausea or vomiting Anesthetic complications: no   No notable events documented.  Last Vitals:  Vitals:   01/21/21 1227 01/21/21 1257  BP: (!) 114/53 (!) 109/51  Pulse: (!) 106 (!) 103  Resp: 18 18  Temp:    SpO2: 95% 95%    Last Pain:  Vitals:   01/21/21 1257  TempSrc:   PainSc: Asleep                 Khoi Hamberger

## 2021-01-21 NOTE — ED Notes (Signed)
Phone report called to [facility] RN Short Stay.

## 2021-01-21 NOTE — Progress Notes (Signed)
Initial Nutrition Assessment  DOCUMENTATION CODES:   Not applicable  INTERVENTION:   -Obtain new wt  -Once diet has been advanced, add:  -Glucerna Shake po TID, each supplement provides 220 kcal and 10 grams of protein  -MVI with minerals daily  NUTRITION DIAGNOSIS:   Increased nutrient needs related to post-op healing as evidenced by estimated needs.  GOAL:   Patient will meet greater than or equal to 90% of their needs  MONITOR:   PO intake, Supplement acceptance, Diet advancement, Weight trends, Skin, I & O's  REASON FOR ASSESSMENT:   Consult Assessment of nutrition requirement/status, Hip fracture protocol  ASSESSMENT:   [name] [name] is a 87 y.o. female with medical history significant of Alzheimer's dz, DM2, HTN, CKD 4, chronic anticoagulation on eliquis (?prior DVT?).  Pt admitted with lt hip fracture.   Pt unavailable at time of visit. RD unable to obtain further nutrition-related history or complete nutrition-focused physical exam at this time.    Per orthopedics notes, plan NPO for surgery later today.  Reviewed wt hx; most recent wt recorded was on 08/04/20. Noted wt has been stable. RD will order new weight to assess recent wt trends.    Medications reviewed and include lactated ringers infusion @ 75 ml/hr.   Lab Results  Component Value Date   HGBA1C 7.9 (H) 01/20/2021   PTA DM medications are 10 units tuojeo daily. Per ADA's Standards of Medical Care of Diabetes, glycemic targets for older adults who have multiple co-morbidities, cognitive impairments, and functional dependence should be less stringent (Hgb A1c <8.0-8.5).     Labs reviewed: CBGS: 180-246 (inpatient orders for glycemic control are 0-9 units insulin aspart every 4 hours).    Diet Order:   Diet Order             Diet NPO time specified Except for: Sips with Meds  Diet effective midnight           Diet NPO time specified  Diet effective midnight                    EDUCATION NEEDS:   Not appropriate for education at this time  Skin:  Skin Assessment: Reviewed RN Assessment  Last BM:  Unknown  Height:   Ht Readings from Last 1 Encounters:  06/29/20 5\' 5"  (1.651 m)    Weight:   Wt Readings from Last 1 Encounters:  08/04/20 83.6 kg    Ideal Body Weight:  56.8 kg  BMI:  There is no height or weight on file to calculate BMI.  Estimated Nutritional Needs:   Kcal:  1500-1700  Protein:  75-90 grams  Fluid:  > 1.5 L    [name] [name], RD, LDN, [place] Registered Dietitian II Certified Diabetes Care and Education Specialist Please refer to [facility] for RD and/or RD on-call/weekend/after hours pager

## 2021-01-21 NOTE — ED Notes (Signed)
Report for bladder scan result >400 given to OR nurse upon call to OR. She stated she would notify the surgeon of this result. Dr Broadus John notified of this also

## 2021-01-21 NOTE — Transfer of Care (Signed)
Immediate Anesthesia Transfer of Care Note  Patient: [name] [name]  Procedure(s) Performed: INTRAMEDULLARY (IM) NAIL INTERTROCHANTRIC (Left: Hip)  Patient Location: PACU  Anesthesia Type:General  Level of Consciousness: awake and drowsy  Airway & Oxygen Therapy: Patient Spontanous Breathing and Patient connected to face mask oxygen  Post-op Assessment: Report given to RN and Post -op Vital signs reviewed and stable  Post vital signs: Reviewed and stable  Last Vitals:  Vitals Value Taken Time  BP 103/45 01/21/21 1026  Temp    Pulse 99 01/21/21 1031  Resp 26 01/21/21 1031  SpO2 100 % 01/21/21 1031  Vitals shown include unvalidated device data.  Last Pain:  Vitals:   01/21/21 0851  TempSrc: Oral  PainSc:          Complications: No notable events documented.

## 2021-01-21 NOTE — ED Notes (Signed)
Notified MD of pt's blood pressure

## 2021-01-21 NOTE — ED Notes (Signed)
Bladder scan showed 473 ml urine, Dr Broadus John notified.

## 2021-01-22 DIAGNOSIS — S72002A Fracture of unspecified part of neck of left femur, initial encounter for closed fracture: Secondary | ICD-10-CM | POA: Diagnosis not present

## 2021-01-22 LAB — GLUCOSE, CAPILLARY
Glucose-Capillary: 136 mg/dL — ABNORMAL HIGH (ref 70–99)
Glucose-Capillary: 160 mg/dL — ABNORMAL HIGH (ref 70–99)
Glucose-Capillary: 162 mg/dL — ABNORMAL HIGH (ref 70–99)
Glucose-Capillary: 184 mg/dL — ABNORMAL HIGH (ref 70–99)
Glucose-Capillary: 199 mg/dL — ABNORMAL HIGH (ref 70–99)
Glucose-Capillary: 206 mg/dL — ABNORMAL HIGH (ref 70–99)

## 2021-01-22 LAB — CBC
HCT: 27 % — ABNORMAL LOW (ref 36.0–46.0)
Hemoglobin: 8.8 g/dL — ABNORMAL LOW (ref 12.0–15.0)
MCH: 30.7 pg (ref 26.0–34.0)
MCHC: 32.6 g/dL (ref 30.0–36.0)
MCV: 94.1 fL (ref 80.0–100.0)
Platelets: 140 10*3/uL — ABNORMAL LOW (ref 150–400)
RBC: 2.87 MIL/uL — ABNORMAL LOW (ref 3.87–5.11)
RDW: 13.2 % (ref 11.5–15.5)
WBC: 9 10*3/uL (ref 4.0–10.5)
nRBC: 0 % (ref 0.0–0.2)

## 2021-01-22 LAB — BASIC METABOLIC PANEL
Anion gap: 8 (ref 5–15)
BUN: 33 mg/dL — ABNORMAL HIGH (ref 8–23)
CO2: 22 mmol/L (ref 22–32)
Calcium: 8.8 mg/dL — ABNORMAL LOW (ref 8.9–10.3)
Chloride: 105 mmol/L (ref 98–111)
Creatinine, Ser: 2.42 mg/dL — ABNORMAL HIGH (ref 0.44–1.00)
GFR, Estimated: 19 mL/min — ABNORMAL LOW (ref >60)
Glucose, Bld: 143 mg/dL — ABNORMAL HIGH (ref 70–99)
Potassium: 3.4 mmol/L — ABNORMAL LOW (ref 3.5–5.1)
Sodium: 135 mmol/L (ref 135–145)

## 2021-01-22 MED ORDER — SODIUM CHLORIDE 0.9 % IV SOLN: INTRAVENOUS | Status: AC

## 2021-01-22 MED ORDER — SENNOSIDES-DOCUSATE SODIUM 8.6-50 MG PO TABS
1.0000 | ORAL_TABLET | Freq: Two times a day (BID) | ORAL | Status: DC
  Administered 2021-01-22 – 2021-01-28 (×14): 1 via ORAL
  Filled 2021-01-22 (×14): qty 1

## 2021-01-22 MED ORDER — APIXABAN 2.5 MG PO TABS
2.5000 mg | ORAL_TABLET | Freq: Two times a day (BID) | ORAL | Status: DC
  Administered 2021-01-23 – 2021-01-28 (×12): 2.5 mg via ORAL
  Filled 2021-01-22 (×12): qty 1

## 2021-01-22 MED ORDER — APIXABAN 2.5 MG PO TABS
2.5000 mg | ORAL_TABLET | Freq: Two times a day (BID) | ORAL | Status: DC
Start: 2021-01-22 — End: 2021-01-22
  Administered 2021-01-22: 2.5 mg via ORAL
  Filled 2021-01-22: qty 1

## 2021-01-22 NOTE — Progress Notes (Signed)
Pharmacy:  Per post-op Fracture Protocol instructions, PTA Eliquis 2.5 mg BID reordered this morning with Hemoglobin > 8 and no transfusion requirement.  First dose given at 1046.Marland Kitchen  Per discussion with Dr. Broadus John, will hold tonight's dose due to hemogobin drop from 12.2 pre-op to 8.8 post-op.  To resume on 11/14 am if hemoglobin stable.  Arty Baumgartner, [place] 01/22/2021 3:05 PM

## 2021-01-22 NOTE — Progress Notes (Signed)
Foley catheter discontinued at 0615. Pt due to void. Zofran 4mg  given IV for reports of nausea. Will monitor for effectiveness.

## 2021-01-22 NOTE — Progress Notes (Addendum)
PROGRESS NOTE    [name] [name]  GMW:[id number] DOB: 1929/06/18 DOA: 01/20/2021 PCP: Ginger Organ., MD  Brief Narrative: 89/F with medical history of dementia, CKD 4, diabetes mellitus, hypertension, chronic diastolic CHF, left lower extremity DVT on anticoagulation -Presented to the ED after mechanical fall, and history of left hip/knee pain -Work-up in the ED was notable for left fracture, creatinine up to 3.2 from baseline around 2.1 -Underwent surgical repair 11/12  Assessment & Plan:   Closed left hip fracture, initial encounter [facility]) -Orthopedics consulting, status post intramedullary nail 11/12 Dr.Xu -Will restart Eliquis tomorrow if hemoglobin stable, -PT eval today -Anticipate need for short-term  AKI on CKD4 -Baseline creatinine is 2.1, creatinine 3.3 on admission -Continue IV fluids today, hold Lasix and ARB -Creatinine improving, BMP in a.m.  Type 2 diabetes mellitus -On hypoglycemics on hold, holding Toujeo, continue sliding scale insulin  Hypertension -BP soft overnight, holding hydralazine, clonidine, ARB and Lasix -Resume beta-blockers for stroke blood pressure  Chronic diastolic CHF Mild pulmonary hypertension -Monitor volume status closely, on IV fluids for AKI now  History of DVT -Eliquis to be resumed tomorrow if Hb is stable  Anxiety, depression -Regimen of Valium received  History of dementia -Delirium precautions  DVT prophylaxis: Restarting Eliquis tomorrow Code Status: Full code Family Communication: No family at bedside Disposition Plan:  Status is: Inpatient  Remains inpatient appropriate because: Severity of illness   Consultants:  Orthopedics Dr.Xu  Procedures: Left hip intramedullary nail Dr. [name] on 11/12  Antimicrobials:    Subjective: -Feels okay overall, mild discomfort at the surgical site  Objective: Vitals:   01/21/21 1743 01/21/21 1929 01/22/21 0343 01/22/21 0500  BP: (!) 161/67 134/60 (!) 131/56   Pulse:  81 72 68   Resp: 18 17 18    Temp: 98.7 F (37.1 C) 98.3 F (36.8 C) 98.5 F (36.9 C)   TempSrc: Oral     SpO2: 98% 96% 98%   Weight: 75.3 kg   76.8 kg  Height: 5\' 3"  (1.6 m)       Intake/Output Summary (Last 24 hours) at 01/22/2021 1100 Last data filed at 01/22/2021 0432 Gross per 24 hour  Intake 2008.13 ml  Output 225 ml  Net 1783.13 ml   Filed Weights   01/21/21 1720 01/21/21 1743 01/22/21 0500  Weight: 75.3 kg 75.3 kg 76.8 kg    Examination:  Gen: Awake, Alert, Oriented X 2, no distress HEENT: no JVD Lungs: Good air movement bilaterally, CTAB CVS: S1S2/RRR Abd: soft, Non tender, non distended, BS present Extremities: Left hip dressing, bruising noted laterally Skin: no new rashes on exposed skin  Psychiatry: Poor insight  Data Reviewed:   CBC: Recent Labs  Lab 01/20/21 1823 01/22/21 0205  WBC 13.4* 9.0  NEUTROABS 10.8*  --   HGB 12.2 8.8*  HCT 38.1 27.0*  MCV 93.4 94.1  PLT 180 440*   Basic Metabolic Panel: Recent Labs  Lab 01/20/21 1823 01/21/21 0329 01/22/21 0205  NA 135 136 135  K 4.2 3.8 3.4*  CL 103 105 105  CO2 19* 21* 22  GLUCOSE 253* 195* 143*  BUN 46* 42* 33*  CREATININE 3.27* 2.88* 2.42*  CALCIUM 10.6* 9.9 8.8*   GFR: Estimated Creatinine Clearance: 15.5 mL/min (A) (by C-G formula based on SCr of 2.42 mg/dL (H)). Liver Function Tests: Recent Labs  Lab 01/20/21 1823  AST 29  ALT 20  ALKPHOS 81  BILITOT 1.4*  PROT 6.8  ALBUMIN 3.8   No results for  input(s): LIPASE, AMYLASE in the last 168 hours. No results for input(s): AMMONIA in the last 168 hours. Coagulation Profile: Recent Labs  Lab 01/20/21 1823  INR 1.3*   Cardiac Enzymes: Recent Labs  Lab 01/20/21 2214  CKTOTAL 112   BNP (last 3 results) No results for input(s): PROBNP in the last 8760 hours. HbA1C: Recent Labs    01/20/21 2214  HGBA1C 7.9*   CBG: Recent Labs  Lab 01/21/21 1027 01/21/21 1928 01/22/21 0012 01/22/21 0341 01/22/21 0806  GLUCAP  213* 213* 160* 136* 162*   Lipid Profile: No results for input(s): CHOL, HDL, LDLCALC, TRIG, CHOLHDL, LDLDIRECT in the last 72 hours. Thyroid Function Tests: No results for input(s): TSH, T4TOTAL, FREET4, T3FREE, THYROIDAB in the last 72 hours. Anemia Panel: No results for input(s): VITAMINB12, FOLATE, FERRITIN, TIBC, IRON, RETICCTPCT in the last 72 hours. Urine analysis:    Component Value Date/Time   COLORURINE YELLOW 06/24/2020 [place] 06/24/2020 1328   LABSPEC 1.006 06/24/2020 1328   PHURINE 5.0 06/24/2020 1328   GLUCOSEU NEGATIVE 06/24/2020 1328   HGBUR NEGATIVE 06/24/2020 [place] 06/24/2020 [place] 06/24/2020 1328   PROTEINUR NEGATIVE 06/24/2020 1328   UROBILINOGEN 1.0 07/25/2009 1045   NITRITE NEGATIVE 06/24/2020 1328   LEUKOCYTESUR MODERATE (A) 06/24/2020 1328   Sepsis Labs: @LABRCNTIP (procalcitonin:4,lacticidven:4)  ) Recent Results (from the past 240 hour(s))  Resp Panel by RT-PCR (Flu A&B, Covid) Nasopharyngeal Swab     Status: None   Collection Time: 01/20/21  6:24 PM   Specimen: Nasopharyngeal Swab; Nasopharyngeal(NP) swabs in vial transport medium  Result Value Ref Range Status   SARS Coronavirus 2 by RT PCR NEGATIVE NEGATIVE Final    Comment: (NOTE) SARS-CoV-2 target nucleic acids are NOT DETECTED.  The SARS-CoV-2 RNA is generally detectable in upper respiratory specimens during the acute phase of infection. The lowest concentration of SARS-CoV-2 viral copies this assay can detect is 138 copies/mL. A negative result does not preclude SARS-Cov-2 infection and should not be used as the sole basis for treatment or other patient management decisions. A negative result may occur with  improper specimen collection/handling, submission of specimen other than nasopharyngeal swab, presence of viral mutation(s) within the areas targeted by this assay, and inadequate number of viral copies(<138 copies/mL). A  negative result must be combined with clinical observations, patient history, and epidemiological information. The expected result is Negative.  Fact Sheet for Patients:  EntrepreneurPulse.com.au  Fact Sheet for Healthcare Providers:  IncredibleEmployment.be  This test is no t yet approved or cleared by the [place] FDA and  has been authorized for detection and/or diagnosis of SARS-CoV-2 by FDA under an Emergency Use Authorization (EUA). This EUA will remain  in effect (meaning this test can be used) for the duration of the COVID-19 declaration under Section 564(b)(1) of the Act, 21 U.S.C.section 360bbb-3(b)(1), unless the authorization is terminated  or revoked sooner.       Influenza A by PCR NEGATIVE NEGATIVE Final   Influenza B by PCR NEGATIVE NEGATIVE Final    Comment: (NOTE) The Xpert Xpress SARS-CoV-2/FLU/RSV plus assay is intended as an aid in the diagnosis of influenza from Nasopharyngeal swab specimens and should not be used as a sole basis for treatment. Nasal washings and aspirates are unacceptable for Xpert Xpress SARS-CoV-2/FLU/RSV testing.  Fact Sheet for Patients: EntrepreneurPulse.com.au  Fact Sheet for Healthcare Providers: IncredibleEmployment.be  This test is not yet approved or cleared by the [place] and  has been authorized for detection and/or diagnosis of SARS-CoV-2 by FDA under an Emergency Use Authorization (EUA). This EUA will remain in effect (meaning this test can be used) for the duration of the COVID-19 declaration under Section 564(b)(1) of the Act, 21 U.S.C. section 360bbb-3(b)(1), unless the authorization is terminated or revoked.  Performed at [facility], [place] [address] [place], [place] [address]   Surgical pcr screen     Status: None   Collection Time: 01/21/21  8:36 AM   Specimen: Nasal Mucosa; Nasal Swab  Result Value Ref Range Status    MRSA, PCR NEGATIVE NEGATIVE Final   Staphylococcus aureus NEGATIVE NEGATIVE Final    Comment: (NOTE) The Xpert SA Assay (FDA approved for NASAL specimens in patients 74 years of age and older), is one component of a comprehensive surveillance program. It is not intended to diagnose infection nor to guide or monitor treatment. Performed at [facility], [place] [address] [place], [place] [address]          Radiology Studies: DG Chest 1 View  Result Date: 01/20/2021 CLINICAL DATA:  Status post fall.  Hip fracture. EXAM: CHEST  1 VIEW COMPARISON:  Chest x-ray 01/07/2021 FINDINGS: The heart and mediastinal contours are unchanged. Aortic calcification. No focal consolidation. No pulmonary edema. No pleural effusion. No pneumothorax. No acute osseous abnormality. IMPRESSION: No active disease. Electronically Signed   By: Iven Finn M.D.   On: 01/20/2021 19:11   CT HEAD WO CONTRAST (5MM)  Result Date: 01/20/2021 CLINICAL DATA:  Fall, headache EXAM: CT HEAD WITHOUT CONTRAST CT CERVICAL SPINE WITHOUT CONTRAST TECHNIQUE: Multidetector CT imaging of the head and cervical spine was performed following the standard protocol without intravenous contrast. Multiplanar CT image reconstructions of the cervical spine were also generated. COMPARISON:  None. FINDINGS: CT HEAD FINDINGS BRAIN: BRAIN Cerebral ventricle sizes are concordant with the degree of cerebral volume loss. Patchy and confluent areas of decreased attenuation are noted throughout the deep and periventricular white matter of the cerebral hemispheres bilaterally, compatible with chronic microvascular ischemic disease. No evidence of large-territorial acute infarction. No parenchymal hemorrhage. No mass lesion. No extra-axial collection. No mass effect or midline shift. No hydrocephalus. Basilar cisterns are patent. Vascular: No hyperdense vessel. Atherosclerotic calcifications are present within the cavernous internal carotid arteries.  Skull: No acute fracture or focal lesion. Sinuses/Orbits: Paranasal sinuses and mastoid air cells are clear. Bilateral lens replacement. Otherwise the orbits are unremarkable. Other: None. CT CERVICAL SPINE FINDINGS Alignment: Normal. Skull base and vertebrae: Multilevel degenerative changes of the spine. No definite severe osseous neural foraminal or central canal stenosis. No acute fracture. No aggressive appearing focal osseous lesion or focal pathologic process. Soft tissues and spinal canal: No prevertebral fluid or swelling. No visible canal hematoma. Upper chest: Unremarkable. Other: None. IMPRESSION: 1. No acute intracranial abnormality. 2. No acute displaced fracture or traumatic listhesis of the cervical spine. Electronically Signed   By: Iven Finn M.D.   On: 01/20/2021 21:10   CT Cervical Spine Wo Contrast  Result Date: 01/20/2021 CLINICAL DATA:  Fall, headache EXAM: CT HEAD WITHOUT CONTRAST CT CERVICAL SPINE WITHOUT CONTRAST TECHNIQUE: Multidetector CT imaging of the head and cervical spine was performed following the standard protocol without intravenous contrast. Multiplanar CT image reconstructions of the cervical spine were also generated. COMPARISON:  None. FINDINGS: CT HEAD FINDINGS BRAIN: BRAIN Cerebral ventricle sizes are concordant with the degree of cerebral volume loss. Patchy and confluent areas of decreased attenuation are noted throughout  the deep and periventricular white matter of the cerebral hemispheres bilaterally, compatible with chronic microvascular ischemic disease. No evidence of large-territorial acute infarction. No parenchymal hemorrhage. No mass lesion. No extra-axial collection. No mass effect or midline shift. No hydrocephalus. Basilar cisterns are patent. Vascular: No hyperdense vessel. Atherosclerotic calcifications are present within the cavernous internal carotid arteries. Skull: No acute fracture or focal lesion. Sinuses/Orbits: Paranasal sinuses and mastoid  air cells are clear. Bilateral lens replacement. Otherwise the orbits are unremarkable. Other: None. CT CERVICAL SPINE FINDINGS Alignment: Normal. Skull base and vertebrae: Multilevel degenerative changes of the spine. No definite severe osseous neural foraminal or central canal stenosis. No acute fracture. No aggressive appearing focal osseous lesion or focal pathologic process. Soft tissues and spinal canal: No prevertebral fluid or swelling. No visible canal hematoma. Upper chest: Unremarkable. Other: None. IMPRESSION: 1. No acute intracranial abnormality. 2. No acute displaced fracture or traumatic listhesis of the cervical spine. Electronically Signed   By: Iven Finn M.D.   On: 01/20/2021 21:10   CT Knee Left Wo Contrast  Result Date: 01/20/2021 CLINICAL DATA:  Knee trauma, tenderness or effusion EXAM: CT OF THE  KNEE WITHOUT CONTRAST TECHNIQUE: Multidetector CT imaging of the left knee was performed according to the standard protocol. Multiplanar CT image reconstructions were also generated. COMPARISON:  X-ray left knee 01/20/2021 FINDINGS: Bones/Joint/Cartilage Moderate to severe tricompartmental degenerative changes of the knee. Question associated chondrocalcinosis. Slight medial subluxation of the femur in relation to the patella and tibia likely due to degenerative changes. No acute displaced fracture or dislocation. Trace associated joint effusion. Ligaments Suboptimally assessed by CT. Muscles and Tendons Grossly unremarkable. Soft tissues Unremarkable.  Vascular calcifications. IMPRESSION: No acute displaced fracture or dislocation in a patient with moderate to severe tricompartmental degenerative changes. Slight medial subluxation of the femur in relation to the patella and tibia likely due to degenerative changes. Electronically Signed   By: Iven Finn M.D.   On: 01/20/2021 21:25   DG Knee Complete 4 Views Left  Result Date: 01/20/2021 CLINICAL DATA:  Fall, left knee pain EXAM:  LEFT KNEE - COMPLETE 4+ VIEW COMPARISON:  None. FINDINGS: Moderate tricompartmental degenerative changes. Chondrocalcinosis in the medial and lateral compartments. No fracture or dislocation is seen. Visualized soft tissues are within normal limits. No suprapatellar knee joint effusion. IMPRESSION: Moderate degenerative changes, as above. Electronically Signed   By: Julian Hy M.D.   On: 01/20/2021 19:22   DG C-Arm 1-60 Min-No Report  Result Date: 01/21/2021 Fluoroscopy was utilized by the requesting physician.  No radiographic interpretation.   DG Hip Unilat With Pelvis 2-3 Views Left  Result Date: 01/20/2021 CLINICAL DATA:  Fall, left hip pain EXAM: DG HIP (WITH OR WITHOUT PELVIS) 2-3V LEFT COMPARISON:  None. FINDINGS: Intertrochanteric left hip fracture, mildly displaced. Right hip is intact. Visualized bony pelvis appears intact. Lumbar spine fixation hardware, incompletely visualized. IMPRESSION: Intertrochanteric left hip fracture, mildly displaced. Electronically Signed   By: Julian Hy M.D.   On: 01/20/2021 19:22   DG FEMUR MIN 2 VIEWS LEFT  Result Date: 01/21/2021 CLINICAL DATA:  Surgery, elective Z41.9 (ICD-10-CM) EXAM: LEFT FEMUR 2 VIEWS COMPARISON:  January 20, 2021. FINDINGS: Fluoro time: 1 minute 28 seconds. Reported radiation dose: 6.82 mGy. Five C-arm fluoroscopic images were obtained intraoperatively and submitted for post operative interpretation. These images demonstrate intramedullary rod and screw fixation a intertrochanteric left femur fracture. Improved alignment, near anatomic. Please see the performing provider's procedural report for further detail. IMPRESSION: Intraoperative fluoroscopy, as  detailed above. Electronically Signed   By: Margaretha Sheffield M.D.   On: 01/21/2021 10:28        Scheduled Meds:  acetaminophen  1,000 mg Oral Q6H   apixaban  2.5 mg Oral BID   feeding supplement (GLUCERNA SHAKE)  237 mL Oral TID BM   insulin aspart  0-9 Units  Subcutaneous Q4H   metoprolol tartrate  25 mg Oral BID   multivitamin with minerals  1 tablet Oral Daily   pantoprazole  40 mg Oral QHS   senna-docusate  1 tablet Oral BID   simvastatin  40 mg Oral QPM   Continuous Infusions:  sodium chloride 75 mL/hr at 01/22/21 1045   methocarbamol (ROBAXIN) IV       LOS: 2 days    Time spent: 66min  Domenic Polite, MD [facility]   01/22/2021, 11:00 AM

## 2021-01-22 NOTE — Evaluation (Signed)
Physical Therapy Evaluation Patient Details Name: [name] [name] MRN: [id number] DOB: 08/27/1929 Today's Date: 01/22/2021  History of Present Illness  [name] [name] is a 87 y.o. female who presented to the ED after a fall resulting in a L intertrochanteric hip fx; s/p IM Nail, WBAT; with medical history significant of Alzheimer's dz, DM2, HTN, CKD 4, chronic anticoagulation on eliquis (?prior DVT?).  Clinical Impression   Patient is s/p above surgery resulting in functional limitations due to the deficits listed below (see PT Problem List). PTA pt reported that she was independent with all ADL's and most IADL's, using a RW for mobility. At this time pt is limited by pain, weakness, and balance concerns; Presents to PT with decr activity tolerance, Pain, functional dependencies; Requires min/mod assist with bed mobility and transfers;  Patient will benefit from skilled PT to increase their independence and safety with mobility to allow discharge to the venue listed below.          Recommendations for follow up therapy are one component of a multi-disciplinary discharge planning process, led by the attending physician.  Recommendations may be updated based on patient status, additional functional criteria and insurance authorization.  Follow Up Recommendations Skilled nursing-short term rehab (<3 hours/day)    Assistance Recommended at Discharge Intermittent Supervision/Assistance  Functional Status Assessment Patient has had a recent decline in their functional status and demonstrates the ability to make significant improvements in function in a reasonable and predictable amount of time.  Equipment Recommendations  Rolling walker (2 wheels);BSC/3in1    Recommendations for Other Services       Precautions / Restrictions Precautions Precautions: Fall Restrictions Weight Bearing Restrictions: No Other Position/Activity Restrictions: WBAT LLE      Mobility  Bed Mobility Overal bed  mobility: Needs Assistance Bed Mobility: Supine to Sit     Supine to sit: Mod assist;+2 for safety/equipment;+2 for physical assistance Sit to supine: Mod assist;+2 for physical assistance;+2 for safety/equipment   General bed mobility comments: Pt needs assist bring LLE off/on the bed and elevating trunk    Transfers Overall transfer level: Needs assistance Equipment used: Rolling walker (2 wheels) Transfers: Sit to/from Stand;Bed to chair/wheelchair/BSC Sit to Stand: Min assist;+2 physical assistance;+2 safety/equipment   Step pivot transfers: Mod assist;+2 physical assistance;+2 safety/equipment       General transfer comment: Pt needing increased assist maintaining her balance when taking small steps; cues to push down into RW to take pressure off of painful LLE in stance while stepping RLE    Ambulation/Gait                  Stairs            Wheelchair Mobility    Modified Rankin (Stroke Patients Only)       Balance Overall balance assessment: Needs assistance Sitting-balance support: No upper extremity supported;Feet supported Sitting balance-Leahy Scale: Good     Standing balance support: Reliant on assistive device for balance Standing balance-Leahy Scale: Poor Standing balance comment: Reliant on BUE support                             Pertinent Vitals/Pain Pain Assessment: Faces Faces Pain Scale: Hurts even more Pain Location: L Hip Pain Descriptors / Indicators: Aching;Discomfort;Grimacing;Guarding Pain Intervention(s): Monitored during session    Home Living Family/patient expects to be discharged to:: Private residence Living Arrangements: Spouse/significant other Available Help at Discharge: Family Type of Home: [place]  Access: Ramped entrance       Home Layout: One level Home Equipment: Rolling Walker (2 wheels);Grab bars - toilet;Grab bars - tub/shower      Prior Function Prior Level of Function :  Independent/Modified Independent             Mobility Comments: Pt reports using a RW for all ambulation ADLs Comments: Independent with ADL;s     Hand Dominance   Dominant Hand: Right    Extremity/Trunk Assessment   Upper Extremity Assessment Upper Extremity Assessment: Defer to OT evaluation    Lower Extremity Assessment Lower Extremity Assessment: LLE deficits/detail LLE Deficits / Details: Grossly decr AROM adn strength, limited by pain postop    Cervical / Trunk Assessment Cervical / Trunk Assessment: Normal  Communication   Communication: No difficulties  Cognition Arousal/Alertness: Awake/alert Behavior During Therapy: WFL for tasks assessed/performed Overall Cognitive Status: Within Functional Limits for tasks assessed                                          General Comments General comments (skin integrity, edema, etc.): VSS on RA    Exercises     Assessment/Plan    PT Assessment Patient needs continued PT services  PT Problem List Decreased strength;Decreased range of motion;Decreased activity tolerance;Decreased balance;Decreased mobility;Decreased coordination;Decreased cognition;Decreased knowledge of use of DME;Decreased safety awareness;Decreased knowledge of precautions;Pain       PT Treatment Interventions DME instruction;Gait training;Functional mobility training;Therapeutic activities;Therapeutic exercise;Balance training;Neuromuscular re-education;Cognitive remediation;Patient/family education    PT Goals (Current goals can be found in the Care Plan section)  Acute Rehab PT Goals Patient Stated Goal: Be independent so she can get home PT Goal Formulation: With patient Time For Goal Achievement: 02/05/21 Potential to Achieve Goals: Good    Frequency Min 3X/week   Barriers to discharge        Co-evaluation               AM-PAC PT "6 Clicks" Mobility  Outcome Measure Help needed turning from your back to your  side while in a flat bed without using bedrails?: A Lot Help needed moving from lying on your back to sitting on the side of a flat bed without using bedrails?: A Lot Help needed moving to and from a bed to a chair (including a wheelchair)?: A Lot Help needed standing up from a chair using your arms (e.g., wheelchair or bedside chair)?: A Little Help needed to walk in hospital room?: A Lot Help needed climbing 3-5 steps with a railing? : A Lot 6 Click Score: 13    End of Session Equipment Utilized During Treatment: Gait belt Activity Tolerance: Patient tolerated treatment well Patient left: in chair;with call bell/phone within reach;with chair alarm set Nurse Communication: Mobility status PT Visit Diagnosis: Unsteadiness on feet (R26.81);Other abnormalities of gait and mobility (R26.89);Pain Pain - Right/Left: Left Pain - part of body: Hip    Time: 8003-4917 PT Time Calculation (min) (ACUTE ONLY): 27 min   Charges:   PT Evaluation $PT Eval Moderate Complexity: 1 Mod PT Treatments $Therapeutic Activity: 8-22 mins        Roney Marion, PT  Acute Rehabilitation Services Pager [phone] Office [phone]   [place] 01/22/2021, 5:42 PM

## 2021-01-22 NOTE — Plan of Care (Signed)

## 2021-01-22 NOTE — Progress Notes (Signed)
Physical Therapy Note  PT eval complete with full note to follow;  Recommend Short-term Rehab at SNF for transition out of hospital;  SNF for rehab to maximize independence and safety with mobility as pt's husband can only give limited physical assist;  She participated well and shows good rehab potential;     Roney Marion, [place] Pager [phone] Office [phone]

## 2021-01-22 NOTE — Progress Notes (Signed)
Pt refused CPAP. Pt stated she has not wore a CPAP in a long time

## 2021-01-22 NOTE — Progress Notes (Signed)
Pt refusing CPAP at this time.

## 2021-01-22 NOTE — Discharge Instructions (Addendum)
    1. Change dressings as needed 2. May shower but keep incisions covered and dry 3. Resume eliquis 4. Take stool softeners as needed 5. Take pain meds as needed  Information on my medicine - ELIQUIS (apixaban)  This medication education was reviewed with me or my healthcare representative as part of my discharge preparation.    Why was Eliquis prescribed for you? Eliquis was prescribed to treat blood clots that may have been found in the veins of your legs (deep vein thrombosis) or in your lungs (pulmonary embolism) and to reduce the risk of them occurring again.  What do You need to know about Eliquis ? The  dose ONE 2.5 mg tablet taken TWICE daily.  Eliquis may be taken with or without food.   Try to take the dose about the same time in the morning and in the evening. If you have difficulty swallowing the tablet whole please discuss with your pharmacist how to take the medication safely.  Take Eliquis exactly as prescribed and DO NOT stop taking Eliquis without talking to the doctor who prescribed the medication.  Stopping may increase your risk of developing a new blood clot.  Refill your prescription before you run out.  After discharge, you should have regular check-up appointments with your healthcare provider that is prescribing your Eliquis.    What do you do if you miss a dose? If a dose of ELIQUIS is not taken at the scheduled time, take it as soon as possible on the same day and twice-daily administration should be resumed. The dose should not be doubled to make up for a missed dose.  Important Safety Information A possible side effect of Eliquis is bleeding. You should call your healthcare provider right away if you experience any of the following: Bleeding from an injury or your nose that does not stop. Unusual colored urine (red or dark brown) or unusual colored stools (red or black). Unusual bruising for unknown reasons. A serious fall or if you hit your  head (even if there is no bleeding).  Some medicines may interact with Eliquis and might increase your risk of bleeding or clotting while on Eliquis. To help avoid this, consult your healthcare provider or pharmacist prior to using any new prescription or non-prescription medications, including herbals, vitamins, non-steroidal anti-inflammatory drugs (NSAIDs) and supplements.  This website has more information on Eliquis (apixaban): http://www.eliquis.com/eliquis/home

## 2021-01-22 NOTE — Progress Notes (Signed)
Subjective: 1 Day Post-Op Procedure(s) (LRB): INTRAMEDULLARY (IM) NAIL INTERTROCHANTRIC (Left) Patient reports pain as mild.    Objective: Vital signs in last 24 hours: Temp:  [97.3 F (36.3 C)-98.7 F (37.1 C)] 98.5 F (36.9 C) (11/13 0343) Pulse Rate:  [59-106] 68 (11/13 0343) Resp:  [14-25] 18 (11/13 0343) BP: (103-161)/(45-100) 131/56 (11/13 0343) SpO2:  [95 %-100 %] 98 % (11/13 0343) Weight:  [75.3 kg-76.8 kg] 76.8 kg (11/13 0500)  Intake/Output from previous day: 11/12 0701 - 11/13 0700 In: 2808.1 [P.O.:60; I.V.:2404.9; IV Piggyback:343.3] Out: 875 [Urine:675; Blood:200] Intake/Output this shift: No intake/output data recorded.  Recent Labs    01/20/21 1823 01/22/21 0205  HGB 12.2 8.8*   Recent Labs    01/20/21 1823 01/22/21 0205  WBC 13.4* 9.0  RBC 4.08 2.87*  HCT 38.1 27.0*  PLT 180 140*   Recent Labs    01/21/21 0329 01/22/21 0205  NA 136 135  K 3.8 3.4*  CL 105 105  CO2 21* 22  BUN 42* 33*  CREATININE 2.88* 2.42*  GLUCOSE 195* 143*  CALCIUM 9.9 8.8*   Recent Labs    01/20/21 1823  INR 1.3*    Neurologically intact Neurovascular intact Sensation intact distally Intact pulses distally Dorsiflexion/Plantar flexion intact Incision: dressing C/D/I No cellulitis present Compartment soft   Assessment/Plan: 1 Day Post-Op Procedure(s) (LRB): INTRAMEDULLARY (IM) NAIL INTERTROCHANTRIC (Left) Up with therapy WBAT LLE ABLA- mild and stable May resume eliquis F/u with Dr. [name] 2 weeks post-op for suture removal     [name] [name] 01/22/2021, 9:30 AM

## 2021-01-22 NOTE — Evaluation (Signed)
Occupational Therapy Evaluation Patient Details Name: [name] [name] MRN: [id number] DOB: 08-05-1929 Today's Date: 01/22/2021   History of Present Illness [name] [name] is a 87 y.o. female who presented to the ED after a fall resulting in a L intertrochanteric hip fx; s/p IM Nail, WBAT; with medical history significant of Alzheimer's dz, DM2, HTN, CKD 4, chronic anticoagulation on eliquis (?prior DVT?).   Clinical Impression   Pt admitted for concerns and procedure listed above. PTA pt reported that she was independent with all ADL's and most IADL's, using a RW for mobility. At this time pt is limited by pain, weakness, and balance concerns. She requires Max A for LB ADL's due to pain and balance deficits, and min-mod A +2 for functional mobility due to balance and safety. Recommend SNF to maximize pt's return to prior level of functioning and reduce caregiver burden. OT will follow acutely and address concerns listed below.       Recommendations for follow up therapy are one component of a multi-disciplinary discharge planning process, led by the attending physician.  Recommendations may be updated based on patient status, additional functional criteria and insurance authorization.   Follow Up Recommendations  Skilled nursing-short term rehab (<3 hours/day)    Assistance Recommended at Discharge Frequent or constant Supervision/Assistance  Functional Status Assessment  Patient has had a recent decline in their functional status and demonstrates the ability to make significant improvements in function in a reasonable and predictable amount of time.  Equipment Recommendations  Other (comment) (TBD)    Recommendations for Other Services       Precautions / Restrictions Precautions Precautions: Fall Restrictions Weight Bearing Restrictions: No Other Position/Activity Restrictions: WBAT LLE      Mobility Bed Mobility Overal bed mobility: Needs Assistance Bed Mobility: Sit to  Supine;Supine to Sit     Supine to sit: Mod assist;+2 for safety/equipment;+2 for physical assistance Sit to supine: Mod assist;+2 for physical assistance;+2 for safety/equipment   General bed mobility comments: Pt needs assist bring LLE off/on the bed and elevating trunk    Transfers Overall transfer level: Needs assistance Equipment used: Rolling walker (2 wheels) Transfers: Sit to/from Stand;Bed to chair/wheelchair/BSC Sit to Stand: Min assist;+2 physical assistance;+2 safety/equipment     Step pivot transfers: Mod assist;+2 physical assistance;+2 safety/equipment     General transfer comment: Pt needing increased assist maintaining her balance when taking small steps.      Balance Overall balance assessment: Needs assistance Sitting-balance support: No upper extremity supported;Feet supported Sitting balance-Leahy Scale: Good     Standing balance support: Reliant on assistive device for balance Standing balance-Leahy Scale: Poor Standing balance comment: Reliant on BUE support                           ADL either performed or assessed with clinical judgement   ADL Overall ADL's : Needs assistance/impaired Eating/Feeding: Set up;Sitting   Grooming: Set up;Sitting   Upper Body Bathing: Set up;Sitting   Lower Body Bathing: Maximal assistance;Sitting/lateral leans;Sit to/from stand   Upper Body Dressing : Set up;Sitting   Lower Body Dressing: Maximal assistance;Sitting/lateral leans;Sit to/from stand   Toilet Transfer: Moderate assistance;+2 for safety/equipment;Stand-pivot   Toileting- Clothing Manipulation and Hygiene: Maximal assistance;Sitting/lateral lean;Sit to/from stand       Functional mobility during ADLs: Moderate assistance;+2 for safety/equipment;Rolling walker (2 wheels) General ADL Comments: Pt limited by hip precautions, pain in LLE, and weakness?balance concerns     Vision Baseline  Vision/History: 1 Wears glasses Ability to See  in Adequate Light: 0 Adequate Patient Visual Report: No change from baseline Vision Assessment?: No apparent visual deficits     Perception     Praxis      Pertinent Vitals/Pain Pain Assessment: Faces Faces Pain Scale: Hurts even more Pain Location: L Hip Pain Descriptors / Indicators: Aching;Discomfort;Grimacing;Guarding Pain Intervention(s): Limited activity within patient's tolerance;Monitored during session;Repositioned;RN gave pain meds during session     Hand Dominance Right   Extremity/Trunk Assessment Upper Extremity Assessment Upper Extremity Assessment: Overall WFL for tasks assessed   Lower Extremity Assessment Lower Extremity Assessment: Defer to PT evaluation   Cervical / Trunk Assessment Cervical / Trunk Assessment: Normal   Communication Communication Communication: No difficulties   Cognition Arousal/Alertness: Awake/alert Behavior During Therapy: WFL for tasks assessed/performed Overall Cognitive Status: Within Functional Limits for tasks assessed                                       General Comments  VSS on RA    Exercises     Shoulder Instructions      Home Living Family/patient expects to be discharged to:: Private residence Living Arrangements: Spouse/significant other Available Help at Discharge: Family Type of Home: House Home Access: Ramped entrance     Home Layout: One level     Bathroom Shower/Tub: Teacher, early years/pre: Standard Bathroom Accessibility: Yes How Accessible: Accessible via walker Home Equipment: Rolling Walker (2 wheels);Grab bars - toilet;Grab bars - tub/shower          Prior Functioning/Environment Prior Level of Function : Independent/Modified Independent             Mobility Comments: Pt reports using a RW for all ambulation ADLs Comments: Independent with ADL;s        OT Problem List: Decreased strength;Decreased range of motion;Decreased activity  tolerance;Impaired balance (sitting and/or standing);Decreased safety awareness;Decreased knowledge of use of DME or AE;Pain      OT Treatment/Interventions: Self-care/ADL training;Therapeutic exercise;Energy conservation;DME and/or AE instruction;Therapeutic activities;Patient/family education;Balance training    OT Goals(Current goals can be found in the care plan section) Acute Rehab OT Goals Patient Stated Goal: To get better to go back home OT Goal Formulation: With patient Time For Goal Achievement: 02/05/21 Potential to Achieve Goals: Good ADL Goals Pt Will Perform Lower Body Bathing: with modified independence;with adaptive equipment;sitting/lateral leans;sit to/from stand Pt Will Perform Lower Body Dressing: with modified independence;with adaptive equipment;sitting/lateral leans;sit to/from stand Pt Will Transfer to Toilet: with min assist;ambulating Pt Will Perform Toileting - Clothing Manipulation and hygiene: with modified independence;with adaptive equipment;sitting/lateral leans;sit to/from stand Additional ADL Goal #1: Pt will problem solve 3 fall prevention techniques that she can use at home.  OT Frequency: Min 2X/week   Barriers to D/C:            Co-evaluation              AM-PAC OT "6 Clicks" Daily Activity     Outcome Measure Help from another person eating meals?: A Little Help from another person taking care of personal grooming?: A Little Help from another person toileting, which includes using toliet, bedpan, or urinal?: A Lot Help from another person bathing (including washing, rinsing, drying)?: A Lot Help from another person to put on and taking off regular upper body clothing?: A Little Help from another person to put on and taking  off regular lower body clothing?: A Lot 6 Click Score: 15   End of Session Equipment Utilized During Treatment: Gait belt;Rolling walker (2 wheels) Nurse Communication: Mobility status  Activity Tolerance: Patient  tolerated treatment well Patient left: in bed;with call bell/phone within reach;with bed alarm set;with nursing/sitter in room  OT Visit Diagnosis: Unsteadiness on feet (R26.81);Other abnormalities of gait and mobility (R26.89);Muscle weakness (generalized) (M62.81);History of falling (Z91.81)                Time: 4627-0350 OT Time Calculation (min): 30 min Charges:  OT General Charges $OT Visit: 1 Visit OT Evaluation $OT Eval Moderate Complexity: 1 Mod OT Treatments $Self Care/Home Management : 8-22 mins  Olanrewaju Osborn H., OTR/L Acute Rehabilitation  Samwise Eckardt Elane Haeden Hudock 01/22/2021, 2:29 PM

## 2021-01-23 ENCOUNTER — Encounter (HOSPITAL_COMMUNITY): Payer: Self-pay | Admitting: Orthopaedic Surgery

## 2021-01-23 DIAGNOSIS — S72002A Fracture of unspecified part of neck of left femur, initial encounter for closed fracture: Secondary | ICD-10-CM | POA: Diagnosis not present

## 2021-01-23 LAB — CBC
HCT: 25.5 % — ABNORMAL LOW (ref 36.0–46.0)
Hemoglobin: 8.5 g/dL — ABNORMAL LOW (ref 12.0–15.0)
MCH: 31.1 pg (ref 26.0–34.0)
MCHC: 33.3 g/dL (ref 30.0–36.0)
MCV: 93.4 fL (ref 80.0–100.0)
Platelets: 136 10*3/uL — ABNORMAL LOW (ref 150–400)
RBC: 2.73 MIL/uL — ABNORMAL LOW (ref 3.87–5.11)
RDW: 13.2 % (ref 11.5–15.5)
WBC: 11.3 10*3/uL — ABNORMAL HIGH (ref 4.0–10.5)
nRBC: 0 % (ref 0.0–0.2)

## 2021-01-23 LAB — BASIC METABOLIC PANEL
Anion gap: 6 (ref 5–15)
BUN: 26 mg/dL — ABNORMAL HIGH (ref 8–23)
CO2: 20 mmol/L — ABNORMAL LOW (ref 22–32)
Calcium: 9.2 mg/dL (ref 8.9–10.3)
Chloride: 109 mmol/L (ref 98–111)
Creatinine, Ser: 1.98 mg/dL — ABNORMAL HIGH (ref 0.44–1.00)
GFR, Estimated: 24 mL/min — ABNORMAL LOW (ref >60)
Glucose, Bld: 146 mg/dL — ABNORMAL HIGH (ref 70–99)
Potassium: 3.8 mmol/L (ref 3.5–5.1)
Sodium: 135 mmol/L (ref 135–145)

## 2021-01-23 LAB — GLUCOSE, CAPILLARY
Glucose-Capillary: 147 mg/dL — ABNORMAL HIGH (ref 70–99)
Glucose-Capillary: 149 mg/dL — ABNORMAL HIGH (ref 70–99)
Glucose-Capillary: 149 mg/dL — ABNORMAL HIGH (ref 70–99)
Glucose-Capillary: 158 mg/dL — ABNORMAL HIGH (ref 70–99)
Glucose-Capillary: 168 mg/dL — ABNORMAL HIGH (ref 70–99)
Glucose-Capillary: 184 mg/dL — ABNORMAL HIGH (ref 70–99)

## 2021-01-23 NOTE — Progress Notes (Signed)
Subjective: 2 Days Post-Op Procedure(s) (LRB): INTRAMEDULLARY (IM) NAIL INTERTROCHANTRIC (Left) Patient reports pain as moderate.    Objective: Vital signs in last 24 hours: Temp:  [98.1 F (36.7 C)-98.7 F (37.1 C)] 98.1 F (36.7 C) (11/14 1142) Pulse Rate:  [70-77] 77 (11/14 1142) Resp:  [17-19] 19 (11/14 1142) BP: (147-163)/(53-68) 150/68 (11/14 1142) SpO2:  [96 %-100 %] 100 % (11/14 1142) Weight:  [77.5 kg] 77.5 kg (11/14 0500)  Intake/Output from previous day: 11/13 0701 - 11/14 0700 In: 60 [P.O.:60] Out: 650 [Urine:650] Intake/Output this shift: No intake/output data recorded.  Recent Labs    01/20/21 1823 01/22/21 0205 01/23/21 0203  HGB 12.2 8.8* 8.5*   Recent Labs    01/22/21 0205 01/23/21 0203  WBC 9.0 11.3*  RBC 2.87* 2.73*  HCT 27.0* 25.5*  PLT 140* 136*   Recent Labs    01/22/21 0205 01/23/21 0203  NA 135 135  K 3.4* 3.8  CL 105 109  CO2 22 20*  BUN 33* 26*  CREATININE 2.42* 1.98*  GLUCOSE 143* 146*  CALCIUM 8.8* 9.2   Recent Labs    01/20/21 1823  INR 1.3*    Neurologically intact Neurovascular intact Sensation intact distally Intact pulses distally Dorsiflexion/Plantar flexion intact Incision: dressing C/D/I No cellulitis present Compartment soft   Assessment/Plan: 2 Days Post-Op Procedure(s) (LRB): INTRAMEDULLARY (IM) NAIL INTERTROCHANTRIC (Left) Up with therapy Weightbearing: WBAT LLE Insicional and dressing care: Dressings left intact until follow-up Orthopedic device(s): None Showering: do not get bandages wet  VTE prophylaxis:  Eliquis   Pain control: percocet  Follow - up plan: 2 weeks Contact information:  Dr. [name], MD, Tawanna Cooler, PA-C      [name] [name] 01/23/2021, 1:53 PM

## 2021-01-23 NOTE — TOC CAGE-AID Note (Signed)
Transition of Care [facility]) - CAGE-AID Screening   Patient Details  Name: [name] [name] MRN: [id number] Date of Birth: 1929-05-18  Clinical Narrative:  No current alcohol or drug use. No need for resources at this time.  CAGE-AID Screening:    Have You Ever Felt You Ought to Cut Down on Your Drinking or Drug Use?: No Have People Annoyed You By Critizing Your Drinking Or Drug Use?: No Have You Felt Bad Or Guilty About Your Drinking Or Drug Use?: No Have You Ever Had a Drink or Used Drugs First Thing In The Morning to Steady Your Nerves or to Get Rid of a Hangover?: No CAGE-AID Score: 0  Substance Abuse Education Offered: No

## 2021-01-23 NOTE — Progress Notes (Signed)
PROGRESS NOTE    [name] [name]  JKK:[id number] DOB: [birth year] DOA: 01/20/2021 PCP: Ginger Organ., MD  Brief Narrative: 87/F with medical history of dementia, CKD 4, diabetes mellitus, hypertension, chronic diastolic CHF, left lower extremity DVT on anticoagulation -Presented to the ED after mechanical fall, and history of left hip/knee pain -Work-up in the ED was notable for left fracture, creatinine up to 3.2 from baseline around 2.1 -Underwent surgical repair 11/12  Assessment & Plan:   Closed left hip fracture, initial encounter [facility]) -Orthopedics consulting, status post intramedullary nail 11/12 Dr.Xu -Restart Eliquis today -PT eval completed, SNF recommended for short-term rehab -Discharge planning  AKI on CKD4 -Baseline creatinine is 2.1, creatinine 3.3 on admission -Continue IV fluids today, hold Lasix and ARB -Creatinine improving, down to 1.9 -Discontinue IV fluids today  Type 2 diabetes mellitus -On hypoglycemics on hold, holding Toujeo, continue sliding scale insulin  Hypertension -BP soft overnight, holding hydralazine, clonidine, ARB and Lasix -Resume beta-blockers for stroke blood pressure  Chronic diastolic CHF Mild pulmonary hypertension -Stop IV fluids, resume diuretics in 1 to 2 days  History of DVT -Eliquis restarted  Anxiety, depression -Regimen of Valium received  History of dementia -Delirium precautions  DVT prophylaxis: Eliquis Code Status: Full code Family Communication: No family at bedside, updated daughter 11/13 Disposition Plan:  Status is: Inpatient  Remains inpatient appropriate because: Severity of illness   Consultants:  Orthopedics Dr.Xu  Procedures: Left hip intramedullary nail Dr. [name] on 11/12  Antimicrobials:    Subjective: -Feels okay, but tired, no events overnight  Objective: Vitals:   01/23/21 0424 01/23/21 0500 01/23/21 0834 01/23/21 1142  BP: (!) 147/53  (!) 163/61 (!) 150/68  Pulse: 70  71 77   Resp: 17  17 19   Temp: 98.7 F (37.1 C)  98.6 F (37 C) 98.1 F (36.7 C)  TempSrc:   Oral Oral  SpO2: 96%  100% 100%  Weight:  77.5 kg    Height:        Intake/Output Summary (Last 24 hours) at 01/23/2021 1205 Last data filed at 01/23/2021 0034 Gross per 24 hour  Intake 60 ml  Output 650 ml  Net -590 ml   Filed Weights   01/21/21 1743 01/22/21 0500 01/23/21 0500  Weight: 75.3 kg 76.8 kg 77.5 kg    Examination:  Gen: Pleasant elderly female, AAO x2, no distress HEENT: No JVD CVS: S1-S2, regular rate rhythm Lungs: Decreased breath sounds to bases Abdomen: Soft, nontender, bowel sounds present Extremities: Left hip dressing, bruising noted laterally Skin: no new rashes on exposed skin  Psychiatry: Poor insight  Data Reviewed:   CBC: Recent Labs  Lab 01/20/21 1823 01/22/21 0205 01/23/21 0203  WBC 13.4* 9.0 11.3*  NEUTROABS 10.8*  --   --   HGB 12.2 8.8* 8.5*  HCT 38.1 27.0* 25.5*  MCV 93.4 94.1 93.4  PLT 180 140* 716*   Basic Metabolic Panel: Recent Labs  Lab 01/20/21 1823 01/21/21 0329 01/22/21 0205 01/23/21 0203  NA 135 136 135 135  K 4.2 3.8 3.4* 3.8  CL 103 105 105 109  CO2 19* 21* 22 20*  GLUCOSE 253* 195* 143* 146*  BUN 46* 42* 33* 26*  CREATININE 3.27* 2.88* 2.42* 1.98*  CALCIUM 10.6* 9.9 8.8* 9.2   GFR: Estimated Creatinine Clearance: 19 mL/min (A) (by C-G formula based on SCr of 1.98 mg/dL (H)). Liver Function Tests: Recent Labs  Lab 01/20/21 1823  AST 29  ALT 20  ALKPHOS 81  BILITOT 1.4*  PROT 6.8  ALBUMIN 3.8   No results for input(s): LIPASE, AMYLASE in the last 168 hours. No results for input(s): AMMONIA in the last 168 hours. Coagulation Profile: Recent Labs  Lab 01/20/21 1823  INR 1.3*   Cardiac Enzymes: Recent Labs  Lab 01/20/21 2214  CKTOTAL 112   BNP (last 3 results) No results for input(s): PROBNP in the last 8760 hours. HbA1C: Recent Labs    01/20/21 2214  HGBA1C 7.9*   CBG: Recent Labs  Lab  01/22/21 2006 01/22/21 2359 01/23/21 0336 01/23/21 0932 01/23/21 1143  GLUCAP 206* 149* 147* 168* 149*   Lipid Profile: No results for input(s): CHOL, HDL, LDLCALC, TRIG, CHOLHDL, LDLDIRECT in the last 72 hours. Thyroid Function Tests: No results for input(s): TSH, T4TOTAL, FREET4, T3FREE, THYROIDAB in the last 72 hours. Anemia Panel: No results for input(s): VITAMINB12, FOLATE, FERRITIN, TIBC, IRON, RETICCTPCT in the last 72 hours. Urine analysis:    Component Value Date/Time   COLORURINE YELLOW 06/24/2020 [place] 06/24/2020 1328   LABSPEC 1.006 06/24/2020 1328   PHURINE 5.0 06/24/2020 1328   GLUCOSEU NEGATIVE 06/24/2020 1328   HGBUR NEGATIVE 06/24/2020 [place] 06/24/2020 [place] 06/24/2020 1328   PROTEINUR NEGATIVE 06/24/2020 1328   UROBILINOGEN 1.0 07/25/2009 1045   NITRITE NEGATIVE 06/24/2020 1328   LEUKOCYTESUR MODERATE (A) 06/24/2020 1328   Sepsis Labs: @LABRCNTIP (procalcitonin:4,lacticidven:4)  ) Recent Results (from the past 240 hour(s))  Resp Panel by RT-PCR (Flu A&B, Covid) Nasopharyngeal Swab     Status: None   Collection Time: 01/20/21  6:24 PM   Specimen: Nasopharyngeal Swab; Nasopharyngeal(NP) swabs in vial transport medium  Result Value Ref Range Status   SARS Coronavirus 2 by RT PCR NEGATIVE NEGATIVE Final    Comment: (NOTE) SARS-CoV-2 target nucleic acids are NOT DETECTED.  The SARS-CoV-2 RNA is generally detectable in upper respiratory specimens during the acute phase of infection. The lowest concentration of SARS-CoV-2 viral copies this assay can detect is 138 copies/mL. A negative result does not preclude SARS-Cov-2 infection and should not be used as the sole basis for treatment or other patient management decisions. A negative result may occur with  improper specimen collection/handling, submission of specimen other than nasopharyngeal swab, presence of viral mutation(s) within the areas  targeted by this assay, and inadequate number of viral copies(<138 copies/mL). A negative result must be combined with clinical observations, patient history, and epidemiological information. The expected result is Negative.  Fact Sheet for Patients:  EntrepreneurPulse.com.au  Fact Sheet for Healthcare Providers:  IncredibleEmployment.be  This test is no t yet approved or cleared by the [place] FDA and  has been authorized for detection and/or diagnosis of SARS-CoV-2 by FDA under an Emergency Use Authorization (EUA). This EUA will remain  in effect (meaning this test can be used) for the duration of the COVID-19 declaration under Section 564(b)(1) of the Act, 21 U.S.C.section 360bbb-3(b)(1), unless the authorization is terminated  or revoked sooner.       Influenza A by PCR NEGATIVE NEGATIVE Final   Influenza B by PCR NEGATIVE NEGATIVE Final    Comment: (NOTE) The Xpert Xpress SARS-CoV-2/FLU/RSV plus assay is intended as an aid in the diagnosis of influenza from Nasopharyngeal swab specimens and should not be used as a sole basis for treatment. Nasal washings and aspirates are unacceptable for Xpert Xpress SARS-CoV-2/FLU/RSV testing.  Fact Sheet for Patients: EntrepreneurPulse.com.au  Fact Sheet for Healthcare Providers: IncredibleEmployment.be  This  test is not yet approved or cleared by the [place] and has been authorized for detection and/or diagnosis of SARS-CoV-2 by FDA under an Emergency Use Authorization (EUA). This EUA will remain in effect (meaning this test can be used) for the duration of the COVID-19 declaration under Section 564(b)(1) of the Act, 21 U.S.C. section 360bbb-3(b)(1), unless the authorization is terminated or revoked.  Performed at [facility], [place] [address], [place], [place] [address]   Surgical pcr screen     Status: None   Collection Time: 01/21/21   8:36 AM   Specimen: Nasal Mucosa; Nasal Swab  Result Value Ref Range Status   MRSA, PCR NEGATIVE NEGATIVE Final   Staphylococcus aureus NEGATIVE NEGATIVE Final    Comment: (NOTE) The Xpert SA Assay (FDA approved for NASAL specimens in patients 34 years of age and older), is one component of a comprehensive surveillance program. It is not intended to diagnose infection nor to guide or monitor treatment. Performed at [facility], [place] [address], [place], [place] [address]      Scheduled Meds:  apixaban  2.5 mg Oral BID   feeding supplement (GLUCERNA SHAKE)  237 mL Oral TID BM   insulin aspart  0-9 Units Subcutaneous Q4H   metoprolol tartrate  25 mg Oral BID   multivitamin with minerals  1 tablet Oral Daily   pantoprazole  40 mg Oral QHS   senna-docusate  1 tablet Oral BID   simvastatin  40 mg Oral QPM   Continuous Infusions:  methocarbamol (ROBAXIN) IV       LOS: 3 days    Time spent: 74min  Domenic Polite, MD [facility]   01/23/2021, 12:05 PM

## 2021-01-23 NOTE — TOC Initial Note (Signed)
Transition of Care [facility]) - Initial/Assessment Note    Patient Details  Name: [name] [name] MRN: [id number] Date of Birth: 22-Aug-1929  Transition of Care [facility]) CM/SW Contact:    Milinda Antis, [place] Phone Number: 01/23/2021, 4:40 PM  Clinical Narrative:                    CSW received consult for possible SNF placement at time of discharge. CSW spoke with patient. Patient reported that patient's spouse is currently unable to care for patient at their home given patient's current physical needs and fall risk. Patient expressed understanding of PT recommendation and is agreeable to SNF placement at time of discharge. Patient reports preference for "the rehab in [place]".  The patient was unable to give CSW the name of the facility. CSW discussed insurance authorization process and will provide Medicare SNF ratings list. Patient reports receivng 3 COVID vaccines. Patient expressed being hopeful for rehab and to feel better soon. No further questions reported at this time.   Skilled Nursing Rehab Facilities-   [email or web address]   Ratings out of 5 possible   Name Address  Phone # [place] Inspection Overall  [facility] [address] [place] 5 5 2 4   [facility]  [address] [place], [place] [phone] 4 2 5 5   [facility] [place], [place] 4 1 1 1   [place], [place] 2 2 4 4   [facility] [address] [place] 2 1 1 1   [place] [address] [place] 3 1 4 3   [facility] [address], [place] 5 2 2 3   [facility] [address], [place] 4 1 2 1   [facility] [place] 5 1 2 2   [facility] [phone] [address], [place] [phone] 4 1 1 1   [facility] [address], [facility] [phone] 4 1 2 1   [place] [address] [place], [place] 4 1 1 1          Patient Goals and CMS Choice        Expected Discharge Plan and Services                                                Prior Living Arrangements/Services                       Activities of Daily Living Home Assistive Devices/Equipment: Gilford Rile (specify type) ADL Screening (condition at time of admission) Patient's cognitive ability adequate to safely complete daily activities?: Yes Is the patient deaf or have difficulty hearing?: No Does the patient have difficulty seeing, even when wearing glasses/contacts?: No Does the patient have difficulty concentrating, remembering, or making decisions?: No Patient able to express need for assistance with ADLs?: Yes Does the patient have difficulty dressing or bathing?: No Independently performs ADLs?: Yes (appropriate for developmental age) Does the patient have difficulty walking or climbing stairs?: No Weakness of Legs: Left Weakness of Arms/Hands: None  Permission Sought/Granted                  Emotional Assessment  Admission diagnosis:  Surgery, elective [Z41.9] Fall, initial encounter [W19.XXXA] Closed left hip fracture, initial encounter [facility]) [S72.002A] Patient Active Problem List   Diagnosis Date Noted   CKD (chronic kidney disease) stage 4, GFR 15-29 ml/min ([place]) 01/20/2021   Closed left hip fracture, initial encounter ([place]) 01/20/2021   Alzheimer's dementia ([place]) 01/20/2021   Renal insufficiency 06/18/2019   Sleep apnea    Essential hypertension    Hyperlipidemia 08/28/2017   History of syncope 08/28/2017   Acute frontal sinusitis 09/20/2013   Obstructive sleep apnea 02/08/2011   RENAL INSUFFICIENCY 09/29/2007   DM2 (diabetes mellitus, type 2) ([place]) 03/31/2007   OBESITY 03/31/2007   Venous insufficiency (chronic) (peripheral) 03/31/2007    Asthmatic bronchitis , chronic ([place]) 03/31/2007   Seasonal and perennial allergic rhinitis 03/31/2007   Asthma, mild persistent 03/31/2007   G E R D 03/31/2007   PCP:  Ginger Organ., MD Pharmacy:   [place], [place] [address] Phone: [phone] Fax: [phone]  [id number] - [place], [place] [address] [place] [address] Phone: [phone] Fax: [phone]     Social Determinants of Health (SDOH) Interventions    Readmission Risk Interventions No flowsheet data found.

## 2021-01-23 NOTE — Care Management Important Message (Signed)
Important Message  Patient Details  Name: [name] [name] MRN: [id number] Date of Birth: Apr 04, 1929   Medicare Important Message Given:  Yes     Joncarlos Atkison 01/23/2021, 10:06 AM

## 2021-01-23 NOTE — Progress Notes (Signed)
Pt refused CPAP for tonight. Pt stated she does not wear one at home. RT instructed pt to have RN call RT if she changes her mind. RT will monitor as needed.

## 2021-01-23 NOTE — NC FL2 (Signed)
[place] MEDICAID FL2 LEVEL OF CARE SCREENING TOOL     IDENTIFICATION  Patient Name: [name] [name] Birthdate: 06-19-1929 Sex: female Admission Date (Current Location): 01/20/2021  [facility] and [place] Number:  Herbalist and Address:  [facility], [place] [address], [place], [place] [address]      Provider Number: [id number]  Attending Physician Name and Address:  Domenic Polite, MD  Relative Name and Phone Number:  Katha Hamming (Daughter)   [phone]    Current Level of Care: Hospital Recommended Level of Care: [place] Prior Approval Number:    Date Approved/Denied:   PASRR Number: [id number]  Discharge Plan: SNF    Current Diagnoses: Patient Active Problem List   Diagnosis Date Noted   CKD (chronic kidney disease) stage 4, GFR 15-29 ml/min ([place]) 01/20/2021   Closed left hip fracture, initial encounter ([place]) 01/20/2021   Alzheimer's dementia ([place]) 01/20/2021   Renal insufficiency 06/18/2019   Sleep apnea    Essential hypertension    Hyperlipidemia 08/28/2017   History of syncope 08/28/2017   Acute frontal sinusitis 09/20/2013   Obstructive sleep apnea 02/08/2011   RENAL INSUFFICIENCY 09/29/2007   DM2 (diabetes mellitus, type 2) ([place]) 03/31/2007   OBESITY 03/31/2007   Venous insufficiency (chronic) (peripheral) 03/31/2007   Asthmatic bronchitis , chronic (HCC) 03/31/2007   Seasonal and perennial allergic rhinitis 03/31/2007   Asthma, mild persistent 03/31/2007   G E R D 03/31/2007    Orientation RESPIRATION BLADDER Height & Weight     Place, Situation, Time, Self  Normal Continent Weight: 170 lb 13.7 oz (77.5 kg) Height:  5\' 3"  (160 cm)  BEHAVIORAL SYMPTOMS/MOOD NEUROLOGICAL BOWEL NUTRITION STATUS      Continent Diet (see d/c summary)  AMBULATORY STATUS COMMUNICATION OF NEEDS Skin   Limited Assist Verbally Surgical wounds                       Personal Care Assistance Level of Assistance   Bathing, Feeding, Dressing Bathing Assistance: Limited assistance Feeding assistance: Independent Dressing Assistance: Limited assistance     Functional Limitations Info  Sight, Hearing, Speech Sight Info: Adequate Hearing Info: Adequate Speech Info: Adequate    SPECIAL CARE FACTORS FREQUENCY  OT (By licensed OT), PT (By licensed PT)     PT Frequency: 5x/ week OT Frequency: 5x/ week            Contractures Contractures Info: Not present    Additional Factors Info  Code Status, Allergies, Insulin Sliding Scale Code Status Info: Full Allergies Info: Aspirin   Codeine   Ibuprofen   Meperidine Hcl   Tape   Augmentin   Insulin Sliding Scale Info: see d/c med list       Current Medications (01/23/2021):  This is the current hospital active medication list Current Facility-Administered Medications  Medication Dose Route Frequency Provider Last Rate Last Admin   acetaminophen (TYLENOL) tablet 325-650 mg  325-650 mg Oral Q6H PRN Leandrew Koyanagi, MD   650 mg at 01/23/21 1636   albuterol (PROVENTIL) (2.5 MG/3ML) 0.083% nebulizer solution 2.5 mg  2.5 mg Nebulization Q6H PRN Leandrew Koyanagi, MD       alum & mag hydroxide-simeth (MAALOX/MYLANTA) 200-200-20 MG/5ML suspension 30 mL  30 mL Oral Q4H PRN Leandrew Koyanagi, MD       apixaban Arne [place]) tablet 2.5 mg  2.5 mg Oral BID Domenic Polite, MD   2.5 mg at 01/23/21 0949   diazepam (  VALIUM) tablet 5 mg  5 mg Oral Q12H PRN Leandrew Koyanagi, MD   5 mg at 01/23/21 0012   feeding supplement (GLUCERNA SHAKE) (GLUCERNA SHAKE) liquid 237 mL  237 mL Oral TID BM Leandrew Koyanagi, MD       HYDROmorphone (DILAUDID) injection 0.5-1 mg  0.5-1 mg Intravenous Q4H PRN Leandrew Koyanagi, MD       insulin aspart (novoLOG) injection 0-9 Units  0-9 Units Subcutaneous Q4H Leandrew Koyanagi, MD   2 Units at 01/23/21 1627   menthol-cetylpyridinium (CEPACOL) lozenge 3 mg  1 lozenge Oral PRN Leandrew Koyanagi, MD       Or   phenol (CHLORASEPTIC) mouth spray 1 spray  1 spray  Mouth/Throat PRN Leandrew Koyanagi, MD       methocarbamol (ROBAXIN) tablet 500 mg  500 mg Oral Q6H PRN Leandrew Koyanagi, MD       Or   methocarbamol (ROBAXIN) 500 mg in dextrose 5 % 50 mL IVPB  500 mg Intravenous Q6H PRN Leandrew Koyanagi, MD       metoprolol tartrate (LOPRESSOR) tablet 25 mg  25 mg Oral BID Leandrew Koyanagi, MD   25 mg at 01/23/21 0949   morphine 2 MG/ML injection 0.5 mg  0.5 mg Intravenous Q2H PRN Leandrew Koyanagi, MD       multivitamin with minerals tablet 1 tablet  1 tablet Oral Daily Leandrew Koyanagi, MD   1 tablet at 01/23/21 0949   ondansetron (ZOFRAN) tablet 4 mg  4 mg Oral Q6H PRN Leandrew Koyanagi, MD       Or   ondansetron [facility]) injection 4 mg  4 mg Intravenous Q6H PRN Leandrew Koyanagi, MD   4 mg at 01/22/21 3557   oxyCODONE (Oxy IR/ROXICODONE) immediate release tablet 10-15 mg  10-15 mg Oral Q4H PRN Leandrew Koyanagi, MD   10 mg at 01/23/21 3220   oxyCODONE (Oxy IR/ROXICODONE) immediate release tablet 5-10 mg  5-10 mg Oral Q4H PRN Leandrew Koyanagi, MD   5 mg at 01/22/21 0908   pantoprazole (PROTONIX) EC tablet 40 mg  40 mg Oral QHS Leandrew Koyanagi, MD   40 mg at 01/22/21 2032   polyethylene glycol (MIRALAX / GLYCOLAX) packet 17 g  17 g Oral Daily PRN Leandrew Koyanagi, MD       senna-docusate (Senokot-S) tablet 1 tablet  1 tablet Oral BID Domenic Polite, MD   1 tablet at 01/23/21 0949   simvastatin (ZOCOR) tablet 40 mg  40 mg Oral QPM Leandrew Koyanagi, MD   40 mg at 01/23/21 1628   sorbitol 70 % solution 30 mL  30 mL Oral Daily PRN Leandrew Koyanagi, MD         Discharge Medications: Please see discharge summary for a list of discharge medications.  Relevant Imaging Results:  Relevant Lab Results:   Additional Information SSN:  [id number]; JANSSEN COVID-19 VACCINE 06/20/2019- reports receiving 1 booster, 5'3" 170lbs  Yidel Teuscher F Gwendlyn Hanback, LCSWA

## 2021-01-24 DIAGNOSIS — S72002A Fracture of unspecified part of neck of left femur, initial encounter for closed fracture: Secondary | ICD-10-CM | POA: Diagnosis not present

## 2021-01-24 LAB — GLUCOSE, CAPILLARY
Glucose-Capillary: 140 mg/dL — ABNORMAL HIGH (ref 70–99)
Glucose-Capillary: 153 mg/dL — ABNORMAL HIGH (ref 70–99)
Glucose-Capillary: 163 mg/dL — ABNORMAL HIGH (ref 70–99)
Glucose-Capillary: 184 mg/dL — ABNORMAL HIGH (ref 70–99)
Glucose-Capillary: 188 mg/dL — ABNORMAL HIGH (ref 70–99)
Glucose-Capillary: 193 mg/dL — ABNORMAL HIGH (ref 70–99)

## 2021-01-24 LAB — BASIC METABOLIC PANEL
Anion gap: 6 (ref 5–15)
BUN: 22 mg/dL (ref 8–23)
CO2: 22 mmol/L (ref 22–32)
Calcium: 9.5 mg/dL (ref 8.9–10.3)
Chloride: 108 mmol/L (ref 98–111)
Creatinine, Ser: 1.71 mg/dL — ABNORMAL HIGH (ref 0.44–1.00)
GFR, Estimated: 28 mL/min — ABNORMAL LOW (ref >60)
Glucose, Bld: 149 mg/dL — ABNORMAL HIGH (ref 70–99)
Potassium: 4 mmol/L (ref 3.5–5.1)
Sodium: 136 mmol/L (ref 135–145)

## 2021-01-24 LAB — CBC
HCT: 27.3 % — ABNORMAL LOW (ref 36.0–46.0)
Hemoglobin: 9 g/dL — ABNORMAL LOW (ref 12.0–15.0)
MCH: 30.9 pg (ref 26.0–34.0)
MCHC: 33 g/dL (ref 30.0–36.0)
MCV: 93.8 fL (ref 80.0–100.0)
Platelets: 155 10*3/uL (ref 150–400)
RBC: 2.91 MIL/uL — ABNORMAL LOW (ref 3.87–5.11)
RDW: 13.2 % (ref 11.5–15.5)
WBC: 8.6 10*3/uL (ref 4.0–10.5)
nRBC: 0 % (ref 0.0–0.2)

## 2021-01-24 MED ORDER — HYDRALAZINE HCL 50 MG PO TABS
50.0000 mg | ORAL_TABLET | Freq: Two times a day (BID) | ORAL | Status: DC
  Administered 2021-01-24 – 2021-01-25 (×3): 50 mg via ORAL
  Filled 2021-01-24 (×3): qty 1

## 2021-01-24 NOTE — Progress Notes (Signed)
PROGRESS NOTE    [name] [name]  UYQ:[id number] DOB: February 19, 1930 DOA: 01/20/2021 PCP: Ginger Organ., MD  Brief Narrative: 87/F with medical history of dementia, CKD 4, diabetes mellitus, hypertension, chronic diastolic CHF, left lower extremity DVT on anticoagulation -Presented to the ED after mechanical fall, and history of left hip/knee pain -Work-up in the ED was notable for left fracture, creatinine up to 3.2 from baseline around 2.1 -Underwent surgical repair 11/12  Assessment & Plan:   Closed left hip fracture, initial encounter [facility]) -Orthopedics consulting, status post intramedullary nail 11/12 Dr.Xu -Restart Eliquis yesterday -PT eval completed, SNF recommended for short-term rehab -Discharge planning, TOC consulted  AKI on CKD4 -Baseline creatinine is 2.1, creatinine 3.3 on admission -Continue IV fluids today, hold Lasix and ARB -Creatinine improving, down to 1.7 which is better than her baseline, fluids discontinued -Restart diuretics at discharge  Type 2 diabetes mellitus -On hypoglycemics on hold, holding Toujeo, continue sliding scale insulin  Hypertension -BP trending up, restart hydralazine,continue beta-blockers  -ARB and Lasix on hold, clonidine on hold  Chronic diastolic CHF Mild pulmonary hypertension -Stop IV fluids, resume Lasix tomorrow  History of DVT -Eliquis restarted  Anxiety, depression -Regimen of Valium received  History of dementia -Delirium precautions  DVT prophylaxis: Eliquis Code Status: Full code Family Communication: No family at bedside, updated daughter 11/14 Disposition Plan:  Status is: Inpatient  Remains inpatient appropriate because: Severity of illness   Consultants:  Orthopedics Dr.Xu  Procedures: Left hip intramedullary nail Dr. [name] on 11/12  Antimicrobials:    Subjective: -Feels okay today, a little tired, no events overnight  Objective: Vitals:   01/23/21 0834 01/23/21 1142 01/23/21 2057 01/24/21  0812  BP: (!) 163/61 (!) 150/68 (!) 162/60 (!) 168/78  Pulse: 71 77 83 86  Resp: 17 19 18 18   Temp: 98.6 F (37 C) 98.1 F (36.7 C) 98.3 F (36.8 C) 98 F (36.7 C)  TempSrc: Oral Oral Oral Oral  SpO2: 100% 100% 100% 99%  Weight:      Height:        Intake/Output Summary (Last 24 hours) at 01/24/2021 1233 Last data filed at 01/24/2021 0900 Gross per 24 hour  Intake 120 ml  Output 1850 ml  Net -1730 ml   Filed Weights   01/21/21 1743 01/22/21 0500 01/23/21 0500  Weight: 75.3 kg 76.8 kg 77.5 kg    Examination:  Gen: Pleasant elderly female, laying in bed, AAOx3, no distress HEENT: No JVD CVS: S1-S2, regular rate rhythm Lungs: Decreased breath sounds to bases Abdomen: Soft, nontender, bowel sounds present Extremities: Left hip with dressing, bruising noted laterally  Skin: no new rashes on exposed skin  Psychiatry: Poor insight  Data Reviewed:   CBC: Recent Labs  Lab 01/20/21 1823 01/22/21 0205 01/23/21 0203 01/24/21 0323  WBC 13.4* 9.0 11.3* 8.6  NEUTROABS 10.8*  --   --   --   HGB 12.2 8.8* 8.5* 9.0*  HCT 38.1 27.0* 25.5* 27.3*  MCV 93.4 94.1 93.4 93.8  PLT 180 140* 136* 638   Basic Metabolic Panel: Recent Labs  Lab 01/20/21 1823 01/21/21 0329 01/22/21 0205 01/23/21 0203 01/24/21 0323  NA 135 136 135 135 136  K 4.2 3.8 3.4* 3.8 4.0  CL 103 105 105 109 108  CO2 19* 21* 22 20* 22  GLUCOSE 253* 195* 143* 146* 149*  BUN 46* 42* 33* 26* 22  CREATININE 3.27* 2.88* 2.42* 1.98* 1.71*  CALCIUM 10.6* 9.9 8.8* 9.2 9.5   GFR:  Estimated Creatinine Clearance: 22 mL/min (A) (by C-G formula based on SCr of 1.71 mg/dL (H)). Liver Function Tests: Recent Labs  Lab 01/20/21 1823  AST 29  ALT 20  ALKPHOS 81  BILITOT 1.4*  PROT 6.8  ALBUMIN 3.8   No results for input(s): LIPASE, AMYLASE in the last 168 hours. No results for input(s): AMMONIA in the last 168 hours. Coagulation Profile: Recent Labs  Lab 01/20/21 1823  INR 1.3*   Cardiac  Enzymes: Recent Labs  Lab 01/20/21 2214  CKTOTAL 112   BNP (last 3 results) No results for input(s): PROBNP in the last 8760 hours. HbA1C: No results for input(s): HGBA1C in the last 72 hours.  CBG: Recent Labs  Lab 01/23/21 2057 01/24/21 0017 01/24/21 0358 01/24/21 0841 01/24/21 1205  GLUCAP 184* 153* 140* 163* 188*   Lipid Profile: No results for input(s): CHOL, HDL, LDLCALC, TRIG, CHOLHDL, LDLDIRECT in the last 72 hours. Thyroid Function Tests: No results for input(s): TSH, T4TOTAL, FREET4, T3FREE, THYROIDAB in the last 72 hours. Anemia Panel: No results for input(s): VITAMINB12, FOLATE, FERRITIN, TIBC, IRON, RETICCTPCT in the last 72 hours. Urine analysis:    Component Value Date/Time   COLORURINE YELLOW 06/24/2020 [place] 06/24/2020 1328   LABSPEC 1.006 06/24/2020 1328   PHURINE 5.0 06/24/2020 1328   GLUCOSEU NEGATIVE 06/24/2020 1328   HGBUR NEGATIVE 06/24/2020 [place] 06/24/2020 [place] 06/24/2020 1328   PROTEINUR NEGATIVE 06/24/2020 1328   UROBILINOGEN 1.0 07/25/2009 1045   NITRITE NEGATIVE 06/24/2020 1328   LEUKOCYTESUR MODERATE (A) 06/24/2020 1328   Sepsis Labs: @LABRCNTIP (procalcitonin:4,lacticidven:4)  ) Recent Results (from the past 240 hour(s))  Resp Panel by RT-PCR (Flu A&B, Covid) Nasopharyngeal Swab     Status: None   Collection Time: 01/20/21  6:24 PM   Specimen: Nasopharyngeal Swab; Nasopharyngeal(NP) swabs in vial transport medium  Result Value Ref Range Status   SARS Coronavirus 2 by RT PCR NEGATIVE NEGATIVE Final    Comment: (NOTE) SARS-CoV-2 target nucleic acids are NOT DETECTED.  The SARS-CoV-2 RNA is generally detectable in upper respiratory specimens during the acute phase of infection. The lowest concentration of SARS-CoV-2 viral copies this assay can detect is 138 copies/mL. A negative result does not preclude SARS-Cov-2 infection and should not be used as the sole basis for  treatment or other patient management decisions. A negative result may occur with  improper specimen collection/handling, submission of specimen other than nasopharyngeal swab, presence of viral mutation(s) within the areas targeted by this assay, and inadequate number of viral copies(<138 copies/mL). A negative result must be combined with clinical observations, patient history, and epidemiological information. The expected result is Negative.  Fact Sheet for Patients:  EntrepreneurPulse.com.au  Fact Sheet for Healthcare Providers:  IncredibleEmployment.be  This test is no t yet approved or cleared by the [place] FDA and  has been authorized for detection and/or diagnosis of SARS-CoV-2 by FDA under an Emergency Use Authorization (EUA). This EUA will remain  in effect (meaning this test can be used) for the duration of the COVID-19 declaration under Section 564(b)(1) of the Act, 21 U.S.C.section 360bbb-3(b)(1), unless the authorization is terminated  or revoked sooner.       Influenza A by PCR NEGATIVE NEGATIVE Final   Influenza B by PCR NEGATIVE NEGATIVE Final    Comment: (NOTE) The Xpert Xpress SARS-CoV-2/FLU/RSV plus assay is intended as an aid in the diagnosis of influenza from Nasopharyngeal swab specimens and should not  be used as a sole basis for treatment. Nasal washings and aspirates are unacceptable for Xpert Xpress SARS-CoV-2/FLU/RSV testing.  Fact Sheet for Patients: EntrepreneurPulse.com.au  Fact Sheet for Healthcare Providers: IncredibleEmployment.be  This test is not yet approved or cleared by the [place] FDA and has been authorized for detection and/or diagnosis of SARS-CoV-2 by FDA under an Emergency Use Authorization (EUA). This EUA will remain in effect (meaning this test can be used) for the duration of the COVID-19 declaration under Section 564(b)(1) of the Act, 21  U.S.C. section 360bbb-3(b)(1), unless the authorization is terminated or revoked.  Performed at [facility], [place] [address] [place], [place] [address]   Surgical pcr screen     Status: None   Collection Time: 01/21/21  8:36 AM   Specimen: Nasal Mucosa; Nasal Swab  Result Value Ref Range Status   MRSA, PCR NEGATIVE NEGATIVE Final   Staphylococcus aureus NEGATIVE NEGATIVE Final    Comment: (NOTE) The Xpert SA Assay (FDA approved for NASAL specimens in patients 54 years of age and older), is one component of a comprehensive surveillance program. It is not intended to diagnose infection nor to guide or monitor treatment. Performed at [facility], [place] [address] [place],  [address]      Scheduled Meds:  apixaban  2.5 mg Oral BID   feeding supplement (GLUCERNA SHAKE)  237 mL Oral TID BM   insulin aspart  0-9 Units Subcutaneous Q4H   metoprolol tartrate  25 mg Oral BID   multivitamin with minerals  1 tablet Oral Daily   pantoprazole  40 mg Oral QHS   senna-docusate  1 tablet Oral BID   simvastatin  40 mg Oral QPM   Continuous Infusions:  methocarbamol (ROBAXIN) IV       LOS: 4 days    Time spent: 56min  Domenic Polite, MD [facility]   01/24/2021, 12:33 PM

## 2021-01-24 NOTE — Progress Notes (Signed)
Physical Therapy Treatment Patient Details Name: [name] [name] MRN: [id number] DOB: Jul 13, 1929 Today's Date: 01/24/2021   History of Present Illness [name] [name] is a 87 y.o. female who presented to the ED after a fall resulting in a L intertrochanteric hip fx; s/p IM Nail, WBAT; with medical history significant of Alzheimer's dz, DM2, HTN, CKD 4, chronic anticoagulation on eliquis (?prior DVT?).    PT Comments    Continuing work on functional mobility and activity tolerance;  Session focused on ROM exercise of L hip, and gait training in room; Excellent use of RW to support painful LLE in stance and allow for imporved R stepping/advancement; Overall progressing well; Anticipate continuing good progress at post-acute rehabilitation.   Good candidate for [facility]   Recommendations for follow up therapy are one component of a multi-disciplinary discharge planning process, led by the attending physician.  Recommendations may be updated based on patient status, additional functional criteria and insurance authorization.  Follow Up Recommendations  Skilled nursing-short term rehab (<3 hours/day)     Assistance Recommended at Discharge Intermittent Supervision/Assistance  Equipment Recommendations  Rolling walker (2 wheels);BSC/3in1    Recommendations for Other Services       Precautions / Restrictions Precautions Precautions: Fall Precaution Comments: Fall risk is present, but minimized with use of RW Restrictions Other Position/Activity Restrictions: WBAT LLE     Mobility  Bed Mobility Overal bed mobility: Needs Assistance Bed Mobility: Supine to Sit     Supine to sit: Mod assist     General bed mobility comments: Cues for technique; Good half bridge towards EOB using RLE; Light mod assist to elevte trunk to sit    Transfers Overall transfer level: Needs assistance Equipment used: Rolling walker (2 wheels) Transfers: Sit to/from Stand Sit to Stand:  Min assist           General transfer comment: Good scoot to EOB to prep for standing; Min assis to power up and steady as she moved her hands from bed to RW    Ambulation/Gait Ambulation/Gait assistance: Min assist Gait Distance (Feet): 18 Feet Assistive device: Rolling walker (2 wheels) Gait Pattern/deviations: Step-to pattern;Antalgic       General Gait Details: Cues for sequence and to use RW to unweight LLE in stance to help with pain as she steps her R foot   Stairs             Wheelchair Mobility    Modified Rankin (Stroke Patients Only)       Balance     Sitting balance-Leahy Scale: Good       Standing balance-Leahy Scale: Poor (approaching Fair)                              Cognition Arousal/Alertness: Awake/alert Behavior During Therapy: WFL for tasks assessed/performed Overall Cognitive Status: Within Functional Limits for tasks assessed                                          Exercises Total Joint Exercises Ankle Circles/Pumps: AROM;Both;10 reps Quad Sets: AROM;Both;10 reps Gluteal Sets: AROM;Both;10 reps Towel Squeeze: AROM;Both;10 reps Heel Slides: AAROM;Left;10 reps    General Comments General comments (skin integrity, edema, etc.): Demonstrated use of incentive spirometer during session      Pertinent Vitals/Pain Pain Assessment: Faces Faces Pain Scale: Hurts little more Pain  Location: L Hip Pain Descriptors / Indicators: Aching;Discomfort;Grimacing;Guarding Pain Intervention(s): Monitored during session;Repositioned    Home Living                          Prior Function            PT Goals (current goals can now be found in the care plan section) Acute Rehab PT Goals Patient Stated Goal: Be independent so she can get home PT Goal Formulation: With patient Time For Goal Achievement: 02/05/21 Potential to Achieve Goals: Good Progress towards PT goals: Progressing toward goals     Frequency    Min 3X/week      PT Plan Current plan remains appropriate    Co-evaluation              AM-PAC PT "6 Clicks" Mobility   Outcome Measure  Help needed turning from your back to your side while in a flat bed without using bedrails?: A Lot Help needed moving from lying on your back to sitting on the side of a flat bed without using bedrails?: A Lot Help needed moving to and from a bed to a chair (including a wheelchair)?: A Lot Help needed standing up from a chair using your arms (e.g., wheelchair or bedside chair)?: A Little Help needed to walk in hospital room?: A Little Help needed climbing 3-5 steps with a railing? : A Lot 6 Click Score: 14    End of Session Equipment Utilized During Treatment: Gait belt Activity Tolerance: Patient tolerated treatment well Patient left: in chair;with call bell/phone within reach;with chair alarm set;Other (comment) (Placing her breakfast order) Nurse Communication: Mobility status;Other (comment) (and can likely use BSC instead of Purewick) PT Visit Diagnosis: Unsteadiness on feet (R26.81);Other abnormalities of gait and mobility (R26.89);Pain Pain - Right/Left: Left Pain - part of body: Hip     Time: 9678-9381 PT Time Calculation (min) (ACUTE ONLY): 30 min  Charges:  $Gait Training: 8-22 mins $Therapeutic Exercise: 8-22 mins                     Roney Marion, PT  Acute Rehabilitation Services Pager [phone] Office [phone]    [name] [name] 01/24/2021, 9:40 AM

## 2021-01-25 DIAGNOSIS — S72002A Fracture of unspecified part of neck of left femur, initial encounter for closed fracture: Secondary | ICD-10-CM | POA: Diagnosis not present

## 2021-01-25 LAB — BASIC METABOLIC PANEL
Anion gap: 7 (ref 5–15)
BUN: 23 mg/dL (ref 8–23)
CO2: 21 mmol/L — ABNORMAL LOW (ref 22–32)
Calcium: 9.6 mg/dL (ref 8.9–10.3)
Chloride: 105 mmol/L (ref 98–111)
Creatinine, Ser: 1.56 mg/dL — ABNORMAL HIGH (ref 0.44–1.00)
GFR, Estimated: 32 mL/min — ABNORMAL LOW (ref >60)
Glucose, Bld: 161 mg/dL — ABNORMAL HIGH (ref 70–99)
Potassium: 3.8 mmol/L (ref 3.5–5.1)
Sodium: 133 mmol/L — ABNORMAL LOW (ref 135–145)

## 2021-01-25 LAB — GLUCOSE, CAPILLARY
Glucose-Capillary: 138 mg/dL — ABNORMAL HIGH (ref 70–99)
Glucose-Capillary: 145 mg/dL — ABNORMAL HIGH (ref 70–99)
Glucose-Capillary: 153 mg/dL — ABNORMAL HIGH (ref 70–99)
Glucose-Capillary: 170 mg/dL — ABNORMAL HIGH (ref 70–99)
Glucose-Capillary: 179 mg/dL — ABNORMAL HIGH (ref 70–99)
Glucose-Capillary: 203 mg/dL — ABNORMAL HIGH (ref 70–99)

## 2021-01-25 MED ORDER — HYDRALAZINE HCL 50 MG PO TABS
25.0000 mg | ORAL_TABLET | Freq: Two times a day (BID) | ORAL | Status: DC
Start: 2021-01-25 — End: 2021-02-01

## 2021-01-25 MED ORDER — DIAZEPAM 5 MG PO TABS
5.0000 mg | ORAL_TABLET | Freq: Two times a day (BID) | ORAL | 0 refills | Status: DC | PRN
Start: 2021-01-25 — End: 2021-02-01

## 2021-01-25 MED ORDER — SODIUM CHLORIDE 0.9 % IV SOLN
6.2500 mg | Freq: Four times a day (QID) | INTRAVENOUS | Status: AC | PRN
  Administered 2021-01-25: 6.25 mg via INTRAVENOUS
  Filled 2021-01-25: qty 0.25

## 2021-01-25 MED ORDER — DIAZEPAM 5 MG PO TABS
5.0000 mg | ORAL_TABLET | Freq: Two times a day (BID) | ORAL | 0 refills | Status: DC | PRN
Start: 2021-01-25 — End: 2021-01-25

## 2021-01-25 MED ORDER — HYDRALAZINE HCL 25 MG PO TABS
25.0000 mg | ORAL_TABLET | Freq: Two times a day (BID) | ORAL | Status: DC
  Administered 2021-01-25 – 2021-01-27 (×4): 25 mg via ORAL
  Filled 2021-01-25 (×4): qty 1

## 2021-01-25 MED ORDER — POLYETHYLENE GLYCOL 3350 17 G PO PACK: 17.0000 g | PACK | Freq: Every day | ORAL | 0 refills | Status: DC | PRN

## 2021-01-25 MED ORDER — SENNOSIDES-DOCUSATE SODIUM 8.6-50 MG PO TABS: 1.0000 | ORAL_TABLET | Freq: Two times a day (BID) | ORAL | Status: DC

## 2021-01-25 MED ORDER — TELMISARTAN 20 MG PO TABS: 20.0000 mg | ORAL_TABLET | Freq: Every day | ORAL | Status: DC

## 2021-01-25 NOTE — Progress Notes (Signed)
Physical Therapy Treatment Note  (Full PT Note to follow)  Pt went from initially planning to go to the bathroom to the sink to brush her teeth to feeling lousy, with N/V as we prepared to stand; serial BPs as follows:    01/25/21 1015 01/25/21 1020 01/25/21 1025  Orthostatic Lying   BP- Lying  --  107/51 (MAP 68) 103/64 (MAP77)  Pulse- Lying  --  87 (Immediately supine after stand pivot back to bed, then going sit to supine; symptomatic for weakness, fatigue, keeping eyes closed, and nausea/vomitting) 126  Orthostatic Sitting  BP- Sitting 135/63 (MAP 85)  --   --   Pulse- Sitting 73 (sitting, in recliner, feet up)  --   --     Marked SBP drop of more than 40mmHG between having feet up in recliner, to feet down in chair, basic pivot with Mod assist back to bed; Once EOB, pt had difficulty keeping eyes open, so began another seated BP, but opted to lay down before getting BP reading due to marked malaise sitting EOB; RN notified and helpful;   Roney Marion, [place] Pager [phone] Office [phone]

## 2021-01-25 NOTE — Progress Notes (Signed)
Physical Therapy Treatment Patient Details Name: [name] [name] MRN: [id number] DOB: [birth year] Today's Date: 01/25/2021   History of Present Illness [name] [name] is a 87 y.o. female who presented to the ED after a fall resulting in a L intertrochanteric hip fx; s/p IM Nail, WBAT; with medical history significant of Alzheimer's dz, DM2, HTN, CKD 4, chronic anticoagulation on eliquis (?prior DVT?).    PT Comments    Continuing work on functional mobility and activity tolerance;  At beginning of session, pt asking to brush her teeth, and we planned to walk to the sink for teeth brushing; She took a drink of apple juice, and shortly after that vomited liquid and solids; Began to look more and more fatigued after that, with persistent nausea; opted to get a BP in recliner (135/63, MAP 85, HR 73); Assisted back to bed via squat pivot, and intended to get a seated BP at EOB, but she still looked quite fatigued EOB, so assisted to supine (BP 107/51, MAP 68, HR 87); positioned as comfortably as possible in supine with HOB elevated (BP 103/64, MAP 77, HR 126); RN present; Notified Dr. Broadus John  Recommendations for follow up therapy are one component of a multi-disciplinary discharge planning process, led by the attending physician.  Recommendations may be updated based on patient status, additional functional criteria and insurance authorization.  Follow Up Recommendations  Skilled nursing-short term rehab (<3 hours/day)     Assistance Recommended at Discharge Intermittent Supervision/Assistance  Equipment Recommendations  Rolling walker (2 wheels);BSC/3in1    Recommendations for Other Services       Precautions / Restrictions Precautions Precautions: Fall Precaution Comments: BP drop on 11/16 with activity Restrictions Other Position/Activity Restrictions: WBAT LLE     Mobility  Bed Mobility Overal bed mobility: Needs Assistance Bed Mobility: Sit to Supine       Sit to supine: Mod  assist   General bed mobility comments: Tried to get sitting BP EOB, however pt with eyes closed and feeling lousy, so assisted back to supine    Transfers Overall transfer level: Needs assistance Equipment used: 1 person hand held assist Transfers: Bed to chair/wheelchair/BSC Sit to Stand: Mod assist   Squat pivot transfers: Max assist       General transfer comment: VERY fatigued and needing heavy assist to help her back to bed    Ambulation/Gait               General Gait Details: Unable to walk as originally planned -- BP drop, nausea, vomitting   Stairs             Wheelchair Mobility    Modified Rankin (Stroke Patients Only)       Balance     Sitting balance-Leahy Scale: Fair       Standing balance-Leahy Scale: Poor                              Cognition Arousal/Alertness: Awake/alert (but VERY fatigued) Behavior During Therapy: WFL for tasks assessed/performed Overall Cognitive Status: Within Functional Limits for tasks assessed (for simple mobility)                                 General Comments: 11/16 -- with fatigue and nausea; eyes closed and internally distracted        Exercises      General Comments General comments (  skin integrity, edema, etc.): Vomited at least 3 times, including vomiting after taking a zofran pill; RN notified, and came in to assist      Pertinent Vitals/Pain Pain Assessment: Faces Faces Pain Scale: Hurts a little bit Pain Location: L Hip, but her nausea more worrisome for her today Pain Descriptors / Indicators: Aching;Discomfort;Grimacing;Guarding Pain Intervention(s): Monitored during session    Home Living                          Prior Function            PT Goals (current goals can now be found in the care plan section) Acute Rehab PT Goals Patient Stated Goal: Be independent so she can get home PT Goal Formulation: With patient Time For Goal  Achievement: 02/05/21 Potential to Achieve Goals: Good Progress towards PT goals: Not progressing toward goals - comment (limited by hypotension, N/V, fatigue)    Frequency    Min 3X/week      PT Plan Current plan remains appropriate    Co-evaluation              AM-PAC PT "6 Clicks" Mobility   Outcome Measure  Help needed turning from your back to your side while in a flat bed without using bedrails?: A Lot Help needed moving from lying on your back to sitting on the side of a flat bed without using bedrails?: A Lot Help needed moving to and from a bed to a chair (including a wheelchair)?: A Lot Help needed standing up from a chair using your arms (e.g., wheelchair or bedside chair)?: A Lot Help needed to walk in hospital room?: Total Help needed climbing 3-5 steps with a railing? : Total 6 Click Score: 10    End of Session Equipment Utilized During Treatment: Gait belt Activity Tolerance: Patient limited by fatigue;Other (comment) (N/V, BP drop) Patient left: in bed;with call bell/phone within reach;with bed alarm set Nurse Communication: Mobility status (N/V, BP drop) PT Visit Diagnosis: Unsteadiness on feet (R26.81);Other abnormalities of gait and mobility (R26.89);Pain Pain - Right/Left: Left Pain - part of body: Hip     Time: 7741-2878 PT Time Calculation (min) (ACUTE ONLY): 37 min  Charges:  $Therapeutic Activity: 38-52 mins                     Roney Marion, PT  Acute Rehabilitation Services Pager [phone] Office [phone]    Colletta [place] 01/25/2021, 12:11 PM

## 2021-01-25 NOTE — Progress Notes (Signed)
RT came to place pt on CPAP and pt refused CPAP. Upon further questioning of the pt  does not wear a CAP at home or has ever used one. RT will monitor as needed.

## 2021-01-25 NOTE — Discharge Summary (Addendum)
Physician Discharge Summary  [name] [name] WCB:[id number] DOB: 01-Apr-1929 DOA: 01/20/2021  PCP: Ginger Organ., MD  Admit date: 01/20/2021 Discharge date: 01/25/2021  Time spent: 35  minutes  Recommendations for Outpatient Follow-up:  PCP 1 to 2 weeks, titrate insulin and diuretics at follow-up Orthopedics Dr.Xu in 2 weeks   Discharge Diagnoses:  Principal Problem:   Closed left hip fracture, initial encounter [facility]) Active Problems:   DM2 (diabetes mellitus, type 2) ([place])   Essential hypertension   CKD (chronic kidney disease) stage 4, GFR 15-29 ml/min (HCC) Mild Alzheimer's dementia ([place]) AKI on CKD 4 Hypertension Chronic diastolic CHF Mild pulmonary hypertension History of DVT  Discharge Condition: Stable  Diet recommendation: Diabetic, low-sodium  Filed Weights   01/21/21 1743 01/22/21 0500 01/23/21 0500  Weight: 75.3 kg 76.8 kg 77.5 kg    History of present illness:  87/F with medical history of dementia, CKD 4, diabetes mellitus, hypertension, chronic diastolic CHF, left lower extremity DVT on anticoagulation -Presented to the ED after mechanical fall, and history of left hip/knee pain -Work-up in the ED was notable for left fracture, creatinine up to 3.2 from baseline around 2.1  Hospital Course:   Closed left hip fracture, initial encounter [facility]) -Orthopedics consulted, status post intramedullary nail 11/12 Dr.Xu -Restarted Eliquis -Pain controlled -PT eval completed, SNF recommended for short-term rehab -Ortho recommended weightbearing as tolerated LLE and follow-up in 2 weeks with Dr.Xu   AKI on CKD4 -Baseline creatinine is 2.1, creatinine 3.3 on admission -Hydrated with IV fluids, held Lasix and ARB on admission -Creatinine improving, down to 1.6 which is better than her baseline, fluids discontinued -Restart ARB, telmisartan at a lower dose at discharge and diuretics as needed   Type 2 diabetes mellitus -Stable, low-dose Toujeo resumed at  discharge   Hypertension -Continue beta-blockers and hydralazine  -Clonidine tapered off, ARB resumed at a lower dose -She did have an episode of orthostatic hypotension today, would avoid aggressive BP control, hydralazine dose was decreased after that   Chronic diastolic CHF Mild pulmonary hypertension -Clinically euvolemic, she is on Lasix as needed at baseline, this can be resumed at discharge   History of DVT -Eliquis resumed postop   Anxiety, depression -valium resumed   History of mild dementia -Has been stable delirium precautions  Consultants:  Orthopedics Dr.Xu   Procedures: Left hip intramedullary nail Dr. [name] on 11/12    Discharge Exam: Vitals:   01/24/21 2032 01/25/21 0700  BP: 131/69 139/67  Pulse: 100 84  Resp: 16 18  Temp: 98.8 F (37.1 C) 99 F (37.2 C)  SpO2: 100% 99%     Discharge Instructions   Discharge Instructions     Weight bearing as tolerated   Complete by: As directed       Allergies as of 01/25/2021       Reactions   Aspirin Nausea And Vomiting   Codeine Nausea And Vomiting   Ibuprofen Nausea And Vomiting   Meperidine Hcl    Tape    ADHESIVE TAPE CAUSES BLISTERS   Augmentin [amoxicillin-pot Clavulanate]    GI upset        Medication List     STOP taking these medications    chlorpheniramine-HYDROcodone 10-8 MG/5ML Suer Commonly known as: Tussionex Pennkinetic ER   cloNIDine 0.1 MG tablet Commonly known as: CATAPRES       TAKE these medications    acetaminophen 500 MG tablet Commonly known as: TYLENOL Take 1,000 mg by mouth every 6 (six) hours  as needed for moderate pain or headache.   albuterol (2.5 MG/3ML) 0.083% nebulizer solution Commonly known as: PROVENTIL Take 3 mLs (2.5 mg total) by nebulization every 6 (six) hours as needed for wheezing or shortness of breath. DX J45.998 What changed: Another medication with the same name was changed. Make sure you understand how and when to take each.    albuterol 108 (90 Base) MCG/ACT inhaler Commonly known as: ProAir HFA 2 puffs every 4-6 hours as directed- rescue What changed:  how much to take how to take this when to take this reasons to take this additional instructions   BD Pen Needle Nano U/F 32G X 4 MM Misc Generic drug: Insulin Pen Needle USE TO INJECT INSULIN ONCE D   diazepam 5 MG tablet Commonly known as: VALIUM Take 5 mg by mouth every 12 (twelve) hours as needed for anxiety.   Eliquis 2.5 MG Tabs tablet Generic drug: apixaban Take 2.5 mg by mouth 2 (two) times daily.   furosemide 40 MG tablet Commonly known as: LASIX Take 40 mg by mouth daily as needed for fluid or edema.   hydrALAZINE 25 MG tablet Commonly known as: APRESOLINE Take 25 mg by mouth 2 (two) times daily.   Lancets Misc by Does not apply route daily.   loratadine 10 MG tablet Commonly known as: CLARITIN Take 10 mg by mouth daily as needed for allergies.   metoprolol tartrate 50 MG tablet Commonly known as: LOPRESSOR Take 25 mg by mouth 2 (two) times daily.   omeprazole 20 MG capsule Commonly known as: PRILOSEC Take 20 mg by mouth every evening.   ONE TOUCH ULTRA SYSTEM KIT w/Device Kit 1 kit by Does not apply route once.   ONE TOUCH ULTRA TEST test strip Generic drug: glucose blood Checks blood sugar 3 times daily   oxyCODONE-acetaminophen 5-325 MG tablet Commonly known as: Percocet Take 1-2 tablets by mouth every 8 (eight) hours as needed for severe pain.   polyethylene glycol 17 g packet Commonly known as: MIRALAX / GLYCOLAX Take 17 g by mouth daily as needed for mild constipation.   senna-docusate 8.6-50 MG tablet Commonly known as: Senokot-S Take 1 tablet by mouth 2 (two) times daily.   simvastatin 40 MG tablet Commonly known as: ZOCOR Take 40 mg by mouth every evening.   telmisartan 20 MG tablet Commonly known as: MICARDIS Take 1 tablet (20 mg total) by mouth daily. What changed:  medication strength how much  to take   Toujeo SoloStar 300 UNIT/ML Solostar Pen Generic drug: insulin glargine (1 Unit Dial) Inject 10 Units into the skin at bedtime.   traMADol 50 MG tablet Commonly known as: ULTRAM Take 1 tablet (50 mg total) by mouth every 6 (six) hours as needed. What changed: reasons to take this               Discharge Care Instructions  (From admission, onward)           Start     Ordered   01/21/21 0000  Weight bearing as tolerated        01/21/21 1006           Allergies  Allergen Reactions   Aspirin Nausea And Vomiting   Codeine Nausea And Vomiting   Ibuprofen Nausea And Vomiting   Meperidine Hcl    Tape     ADHESIVE TAPE CAUSES BLISTERS   Augmentin [Amoxicillin-Pot Clavulanate]     GI upset    Follow-up Information  Leandrew Koyanagi, MD Follow up in 2 week(s).   Specialty: Orthopedic Surgery Why: For suture removal, For wound re-check Contact information: [place] [address] [phone]                  The results of significant diagnostics from this hospitalization (including imaging, microbiology, ancillary and laboratory) are listed below for reference.    Significant Diagnostic Studies: DG Chest 1 View  Result Date: 01/20/2021 CLINICAL DATA:  Status post fall.  Hip fracture. EXAM: CHEST  1 VIEW COMPARISON:  Chest x-ray 01/07/2021 FINDINGS: The heart and mediastinal contours are unchanged. Aortic calcification. No focal consolidation. No pulmonary edema. No pleural effusion. No pneumothorax. No acute osseous abnormality. IMPRESSION: No active disease. Electronically Signed   By: Iven Finn M.D.   On: 01/20/2021 19:11   DG Chest 2 View  Result Date: 01/07/2021 CLINICAL DATA:  Cough. EXAM: CHEST - 2 VIEW COMPARISON:  06/24/2020 FINDINGS: The cardiac silhouette, mediastinal and hilar contours are normal. Minimal streaky basilar scarring changes but no infiltrates, edema or effusions. No pulmonary lesions. The  bony thorax is intact. IMPRESSION: Minimal basilar scarring changes but no infiltrates, edema or effusions. Electronically Signed   By: Marijo Sanes M.D.   On: 01/07/2021 13:11   CT HEAD WO CONTRAST (5MM)  Result Date: 01/20/2021 CLINICAL DATA:  Fall, headache EXAM: CT HEAD WITHOUT CONTRAST CT CERVICAL SPINE WITHOUT CONTRAST TECHNIQUE: Multidetector CT imaging of the head and cervical spine was performed following the standard protocol without intravenous contrast. Multiplanar CT image reconstructions of the cervical spine were also generated. COMPARISON:  None. FINDINGS: CT HEAD FINDINGS BRAIN: BRAIN Cerebral ventricle sizes are concordant with the degree of cerebral volume loss. Patchy and confluent areas of decreased attenuation are noted throughout the deep and periventricular white matter of the cerebral hemispheres bilaterally, compatible with chronic microvascular ischemic disease. No evidence of large-territorial acute infarction. No parenchymal hemorrhage. No mass lesion. No extra-axial collection. No mass effect or midline shift. No hydrocephalus. Basilar cisterns are patent. Vascular: No hyperdense vessel. Atherosclerotic calcifications are present within the cavernous internal carotid arteries. Skull: No acute fracture or focal lesion. Sinuses/Orbits: Paranasal sinuses and mastoid air cells are clear. Bilateral lens replacement. Otherwise the orbits are unremarkable. Other: None. CT CERVICAL SPINE FINDINGS Alignment: Normal. Skull base and vertebrae: Multilevel degenerative changes of the spine. No definite severe osseous neural foraminal or central canal stenosis. No acute fracture. No aggressive appearing focal osseous lesion or focal pathologic process. Soft tissues and spinal canal: No prevertebral fluid or swelling. No visible canal hematoma. Upper chest: Unremarkable. Other: None. IMPRESSION: 1. No acute intracranial abnormality. 2. No acute displaced fracture or traumatic listhesis of the  cervical spine. Electronically Signed   By: Iven Finn M.D.   On: 01/20/2021 21:10   CT Cervical Spine Wo Contrast  Result Date: 01/20/2021 CLINICAL DATA:  Fall, headache EXAM: CT HEAD WITHOUT CONTRAST CT CERVICAL SPINE WITHOUT CONTRAST TECHNIQUE: Multidetector CT imaging of the head and cervical spine was performed following the standard protocol without intravenous contrast. Multiplanar CT image reconstructions of the cervical spine were also generated. COMPARISON:  None. FINDINGS: CT HEAD FINDINGS BRAIN: BRAIN Cerebral ventricle sizes are concordant with the degree of cerebral volume loss. Patchy and confluent areas of decreased attenuation are noted throughout the deep and periventricular white matter of the cerebral hemispheres bilaterally, compatible with chronic microvascular ischemic disease. No evidence of large-territorial acute infarction. No parenchymal hemorrhage. No mass lesion. No extra-axial collection.  No mass effect or midline shift. No hydrocephalus. Basilar cisterns are patent. Vascular: No hyperdense vessel. Atherosclerotic calcifications are present within the cavernous internal carotid arteries. Skull: No acute fracture or focal lesion. Sinuses/Orbits: Paranasal sinuses and mastoid air cells are clear. Bilateral lens replacement. Otherwise the orbits are unremarkable. Other: None. CT CERVICAL SPINE FINDINGS Alignment: Normal. Skull base and vertebrae: Multilevel degenerative changes of the spine. No definite severe osseous neural foraminal or central canal stenosis. No acute fracture. No aggressive appearing focal osseous lesion or focal pathologic process. Soft tissues and spinal canal: No prevertebral fluid or swelling. No visible canal hematoma. Upper chest: Unremarkable. Other: None. IMPRESSION: 1. No acute intracranial abnormality. 2. No acute displaced fracture or traumatic listhesis of the cervical spine. Electronically Signed   By: Iven Finn M.D.   On: 01/20/2021 21:10    CT Knee Left Wo Contrast  Result Date: 01/20/2021 CLINICAL DATA:  Knee trauma, tenderness or effusion EXAM: CT OF THE  KNEE WITHOUT CONTRAST TECHNIQUE: Multidetector CT imaging of the left knee was performed according to the standard protocol. Multiplanar CT image reconstructions were also generated. COMPARISON:  X-ray left knee 01/20/2021 FINDINGS: Bones/Joint/Cartilage Moderate to severe tricompartmental degenerative changes of the knee. Question associated chondrocalcinosis. Slight medial subluxation of the femur in relation to the patella and tibia likely due to degenerative changes. No acute displaced fracture or dislocation. Trace associated joint effusion. Ligaments Suboptimally assessed by CT. Muscles and Tendons Grossly unremarkable. Soft tissues Unremarkable.  Vascular calcifications. IMPRESSION: No acute displaced fracture or dislocation in a patient with moderate to severe tricompartmental degenerative changes. Slight medial subluxation of the femur in relation to the patella and tibia likely due to degenerative changes. Electronically Signed   By: Iven Finn M.D.   On: 01/20/2021 21:25   DG Knee Complete 4 Views Left  Result Date: 01/20/2021 CLINICAL DATA:  Fall, left knee pain EXAM: LEFT KNEE - COMPLETE 4+ VIEW COMPARISON:  None. FINDINGS: Moderate tricompartmental degenerative changes. Chondrocalcinosis in the medial and lateral compartments. No fracture or dislocation is seen. Visualized soft tissues are within normal limits. No suprapatellar knee joint effusion. IMPRESSION: Moderate degenerative changes, as above. Electronically Signed   By: Julian Hy M.D.   On: 01/20/2021 19:22   DG C-Arm 1-60 Min-No Report  Result Date: 01/21/2021 Fluoroscopy was utilized by the requesting physician.  No radiographic interpretation.   DG Hip Unilat With Pelvis 2-3 Views Left  Result Date: 01/20/2021 CLINICAL DATA:  Fall, left hip pain EXAM: DG HIP (WITH OR WITHOUT PELVIS) 2-3V  LEFT COMPARISON:  None. FINDINGS: Intertrochanteric left hip fracture, mildly displaced. Right hip is intact. Visualized bony pelvis appears intact. Lumbar spine fixation hardware, incompletely visualized. IMPRESSION: Intertrochanteric left hip fracture, mildly displaced. Electronically Signed   By: Julian Hy M.D.   On: 01/20/2021 19:22   DG FEMUR MIN 2 VIEWS LEFT  Result Date: 01/21/2021 CLINICAL DATA:  Surgery, elective Z41.9 (ICD-10-CM) EXAM: LEFT FEMUR 2 VIEWS COMPARISON:  January 20, 2021. FINDINGS: Fluoro time: 1 minute 28 seconds. Reported radiation dose: 6.82 mGy. Five C-arm fluoroscopic images were obtained intraoperatively and submitted for post operative interpretation. These images demonstrate intramedullary rod and screw fixation a intertrochanteric left femur fracture. Improved alignment, near anatomic. Please see the performing provider's procedural report for further detail. IMPRESSION: Intraoperative fluoroscopy, as detailed above. Electronically Signed   By: Margaretha Sheffield M.D.   On: 01/21/2021 10:28    Microbiology: Recent Results (from the past 240 hour(s))  Resp Panel by RT-PCR (  Flu A&B, Covid) Nasopharyngeal Swab     Status: None   Collection Time: 01/20/21  6:24 PM   Specimen: Nasopharyngeal Swab; Nasopharyngeal(NP) swabs in vial transport medium  Result Value Ref Range Status   SARS Coronavirus 2 by RT PCR NEGATIVE NEGATIVE Final    Comment: (NOTE) SARS-CoV-2 target nucleic acids are NOT DETECTED.  The SARS-CoV-2 RNA is generally detectable in upper respiratory specimens during the acute phase of infection. The lowest concentration of SARS-CoV-2 viral copies this assay can detect is 138 copies/mL. A negative result does not preclude SARS-Cov-2 infection and should not be used as the sole basis for treatment or other patient management decisions. A negative result may occur with  improper specimen collection/handling, submission of specimen other than  nasopharyngeal swab, presence of viral mutation(s) within the areas targeted by this assay, and inadequate number of viral copies(<138 copies/mL). A negative result must be combined with clinical observations, patient history, and epidemiological information. The expected result is Negative.  Fact Sheet for Patients:  EntrepreneurPulse.com.au  Fact Sheet for Healthcare Providers:  IncredibleEmployment.be  This test is no t yet approved or cleared by the [place] FDA and  has been authorized for detection and/or diagnosis of SARS-CoV-2 by FDA under an Emergency Use Authorization (EUA). This EUA will remain  in effect (meaning this test can be used) for the duration of the COVID-19 declaration under Section 564(b)(1) of the Act, 21 U.S.C.section 360bbb-3(b)(1), unless the authorization is terminated  or revoked sooner.       Influenza A by PCR NEGATIVE NEGATIVE Final   Influenza B by PCR NEGATIVE NEGATIVE Final    Comment: (NOTE) The Xpert Xpress SARS-CoV-2/FLU/RSV plus assay is intended as an aid in the diagnosis of influenza from Nasopharyngeal swab specimens and should not be used as a sole basis for treatment. Nasal washings and aspirates are unacceptable for Xpert Xpress SARS-CoV-2/FLU/RSV testing.  Fact Sheet for Patients: EntrepreneurPulse.com.au  Fact Sheet for Healthcare Providers: IncredibleEmployment.be  This test is not yet approved or cleared by the [place] FDA and has been authorized for detection and/or diagnosis of SARS-CoV-2 by FDA under an Emergency Use Authorization (EUA). This EUA will remain in effect (meaning this test can be used) for the duration of the COVID-19 declaration under Section 564(b)(1) of the Act, 21 U.S.C. section 360bbb-3(b)(1), unless the authorization is terminated or revoked.  Performed at [facility], [place] [address] [place], [place] [address]    Surgical pcr screen     Status: None   Collection Time: 01/21/21  8:36 AM   Specimen: Nasal Mucosa; Nasal Swab  Result Value Ref Range Status   MRSA, PCR NEGATIVE NEGATIVE Final   Staphylococcus aureus NEGATIVE NEGATIVE Final    Comment: (NOTE) The Xpert SA Assay (FDA approved for NASAL specimens in patients 40 years of age and older), is one component of a comprehensive surveillance program. It is not intended to diagnose infection nor to guide or monitor treatment. Performed at [facility], [place] [address] [place], [place]  [address]      Labs: Basic Metabolic Panel: Recent Labs  Lab 01/21/21 0329 01/22/21 0205 01/23/21 0203 01/24/21 0323 01/25/21 0154  NA 136 135 135 136 133*  K 3.8 3.4* 3.8 4.0 3.8  CL 105 105 109 108 105  CO2 21* 22 20* 22 21*  GLUCOSE 195* 143* 146* 149* 161*  BUN 42* 33* 26* 22 23  CREATININE 2.88* 2.42* 1.98* 1.71* 1.56*  CALCIUM 9.9 8.8* 9.2  9.5 9.6   Liver Function Tests: Recent Labs  Lab 01/20/21 1823  AST 29  ALT 20  ALKPHOS 81  BILITOT 1.4*  PROT 6.8  ALBUMIN 3.8   No results for input(s): LIPASE, AMYLASE in the last 168 hours. No results for input(s): AMMONIA in the last 168 hours. CBC: Recent Labs  Lab 01/20/21 1823 01/22/21 0205 01/23/21 0203 01/24/21 0323  WBC 13.4* 9.0 11.3* 8.6  NEUTROABS 10.8*  --   --   --   HGB 12.2 8.8* 8.5* 9.0*  HCT 38.1 27.0* 25.5* 27.3*  MCV 93.4 94.1 93.4 93.8  PLT 180 140* 136* 155   Cardiac Enzymes: Recent Labs  Lab 01/20/21 2214  CKTOTAL 112   BNP: BNP (last 3 results) No results for input(s): BNP in the last 8760 hours.  ProBNP (last 3 results) No results for input(s): PROBNP in the last 8760 hours.  CBG: Recent Labs  Lab 01/24/21 1624 01/24/21 2031 01/25/21 0007 01/25/21 0443 01/25/21 0945  GLUCAP 193* 184* 170* 153* 179*       Signed:  Domenic Polite MD.  [facility] 01/25/2021, 11:39 AM

## 2021-01-25 NOTE — TOC Progression Note (Addendum)
Transition of Care [facility]) - Progression Note    Patient Details  Name: [name] [name] MRN: [id number] Date of Birth: 1929-05-14  [facility] CM/SW Contact  Joanne Chars, LCSW Phone Number: 01/25/2021, 11:45 AM  Clinical Narrative:   CSW brought medicare.gov choice document to pt room, discussed bed offers.  Pt asked CSW to call her daughter with bed offers.  CSW reached daughter [name] by phone, provided bed offers and medicare.gov ratings.  She will discuss with her brother and call back within an hour.  1300: CSW received call from daughter, would like to chose [facility].  Confirming with [place] at [place].  Auth initiated in [place].          Expected Discharge Plan and Services                                                 Social Determinants of Health (SDOH) Interventions    Readmission Risk Interventions No flowsheet data found.

## 2021-01-25 NOTE — Progress Notes (Signed)
   01/25/21 1150  Mobility  Activity Refused mobility;Contraindicated/medical hold (Pt has been experiencing nausea and vomiting.)

## 2021-01-26 ENCOUNTER — Inpatient Hospital Stay (HOSPITAL_COMMUNITY): Payer: Medicare Other

## 2021-01-26 LAB — CBC
HCT: 29.9 % — ABNORMAL LOW (ref 36.0–46.0)
Hemoglobin: 9.7 g/dL — ABNORMAL LOW (ref 12.0–15.0)
MCH: 30.8 pg (ref 26.0–34.0)
MCHC: 32.4 g/dL (ref 30.0–36.0)
MCV: 94.9 fL (ref 80.0–100.0)
Platelets: 232 10*3/uL (ref 150–400)
RBC: 3.15 MIL/uL — ABNORMAL LOW (ref 3.87–5.11)
RDW: 13.6 % (ref 11.5–15.5)
WBC: 8.8 10*3/uL (ref 4.0–10.5)
nRBC: 0 % (ref 0.0–0.2)

## 2021-01-26 LAB — BASIC METABOLIC PANEL
Anion gap: 7 (ref 5–15)
BUN: 21 mg/dL (ref 8–23)
CO2: 22 mmol/L (ref 22–32)
Calcium: 10.1 mg/dL (ref 8.9–10.3)
Chloride: 108 mmol/L (ref 98–111)
Creatinine, Ser: 1.72 mg/dL — ABNORMAL HIGH (ref 0.44–1.00)
GFR, Estimated: 28 mL/min — ABNORMAL LOW (ref >60)
Glucose, Bld: 145 mg/dL — ABNORMAL HIGH (ref 70–99)
Potassium: 4.3 mmol/L (ref 3.5–5.1)
Sodium: 137 mmol/L (ref 135–145)

## 2021-01-26 LAB — GLUCOSE, CAPILLARY
Glucose-Capillary: 134 mg/dL — ABNORMAL HIGH (ref 70–99)
Glucose-Capillary: 135 mg/dL — ABNORMAL HIGH (ref 70–99)
Glucose-Capillary: 144 mg/dL — ABNORMAL HIGH (ref 70–99)
Glucose-Capillary: 146 mg/dL — ABNORMAL HIGH (ref 70–99)
Glucose-Capillary: 154 mg/dL — ABNORMAL HIGH (ref 70–99)
Glucose-Capillary: 157 mg/dL — ABNORMAL HIGH (ref 70–99)
Glucose-Capillary: 169 mg/dL — ABNORMAL HIGH (ref 70–99)

## 2021-01-26 LAB — SARS CORONAVIRUS 2 (TAT 6-24 HRS): SARS Coronavirus 2: NEGATIVE

## 2021-01-26 MED ORDER — PROCHLORPERAZINE EDISYLATE 10 MG/2ML IJ SOLN
10.0000 mg | Freq: Four times a day (QID) | INTRAMUSCULAR | Status: DC | PRN
  Administered 2021-01-26 – 2021-01-28 (×3): 10 mg via INTRAVENOUS
  Filled 2021-01-26 (×3): qty 2

## 2021-01-26 NOTE — Progress Notes (Signed)
Occupational Therapy Treatment Patient Details Name: [name] [name] MRN: [id number] DOB: [birth year] Today's Date: 01/26/2021   History of present illness [name] [name] is a 87 y.o. female who presented to the ED after a fall resulting in a L intertrochanteric hip fx; s/p IM Nail, WBAT; with medical history significant of Alzheimer's dz, DM2, HTN, CKD 4, chronic anticoagulation on eliquis (?prior DVT?).   OT comments  Pt complaining of increased nausea and pain this session, limiting her ability to tolerate much movement. Pt requiring mod A  +1-2 for bed mobility, standing, and taking small steps for transfers. She requires increased encouragement and verbal cuing to participate and initiate tasks. Tolerated EOB for 5 mins, before requesting to sit back down, was able to encourage her to stand for transfer and complete limited LB exercises. OT will continue to follow acutely.    Recommendations for follow up therapy are one component of a multi-disciplinary discharge planning process, led by the attending physician.  Recommendations may be updated based on patient status, additional functional criteria and insurance authorization.    Follow Up Recommendations  Skilled nursing-short term rehab (<3 hours/day)    Assistance Recommended at Discharge Frequent or constant Supervision/Assistance  Equipment Recommendations  Other (comment) (TBD)    Recommendations for Other Services      Precautions / Restrictions Precautions Precautions: Fall Precaution Comments: Consider orthostatics; BP drop with upright activity on 11/16 Restrictions Weight Bearing Restrictions: Yes LLE Weight Bearing: Weight bearing as tolerated Other Position/Activity Restrictions: WBAT LLE       Mobility Bed Mobility Overal bed mobility: Needs Assistance Bed Mobility: Supine to Sit;Sit to Supine     Supine to sit: Mod assist Sit to supine: Mod assist   General bed mobility comments: Pt needs assist bring  LLE off/on the bed and elevating trunk    Transfers Overall transfer level: Needs assistance Equipment used: Rolling walker (2 wheels) Transfers: Sit to/from Stand;Bed to chair/wheelchair/BSC Sit to Stand: Mod assist Stand pivot transfers: Mod assist;+2 physical assistance;+2 safety/equipment         General transfer comment: Pt requiring increased assist, able to take small steps     Balance Overall balance assessment: Needs assistance Sitting-balance support: No upper extremity supported;Feet supported Sitting balance-Leahy Scale: Fair     Standing balance support: Reliant on assistive device for balance Standing balance-Leahy Scale: Poor Standing balance comment: Reliant on BUE support                           ADL either performed or assessed with clinical judgement   ADL Overall ADL's : Needs assistance/impaired     Grooming: Set up;Sitting;Wash/dry hands;Wash/dry face Grooming Details (indicate cue type and reason): completed sitting EOB                 Toilet Transfer: Moderate assistance;+2 for safety/equipment;Stand-pivot Toilet Transfer Details (indicate cue type and reason): Pt having difficulty maintaining standing position this sessio due to pain and nausea Toileting- Clothing Manipulation and Hygiene: Maximal assistance;Sitting/lateral lean;Sit to/from stand Toileting - Clothing Manipulation Details (indicate cue type and reason): Pt requiring max, close to total assist due to balance and pain       General ADL Comments: Pt with increased nausea and painthis session    Extremity/Trunk Assessment              Vision       Perception     Praxis  Cognition Arousal/Alertness: Awake/alert Behavior During Therapy: WFL for tasks assessed/performed Overall Cognitive Status: Within Functional Limits for tasks assessed                                            Exercises     Shoulder Instructions        General Comments VSS, pt c/o nausea, no dry heaving this session    Pertinent Vitals/ Pain       Pain Assessment: Faces Faces Pain Scale: Hurts even more Pain Location: L Hip, but her nausea more worrisome for her today Pain Descriptors / Indicators: Aching;Discomfort;Grimacing;Guarding Pain Intervention(s): Limited activity within patient's tolerance;Monitored during session;Repositioned  Home Living                                          Prior Functioning/Environment              Frequency  Min 2X/week        Progress Toward Goals  OT Goals(current goals can now be found in the care plan section)  Progress towards OT goals: Progressing toward goals  Acute Rehab OT Goals Patient Stated Goal: To feel better OT Goal Formulation: With patient Time For Goal Achievement: 02/05/21 Potential to Achieve Goals: Good ADL Goals Pt Will Perform Lower Body Bathing: with modified independence;with adaptive equipment;sitting/lateral leans;sit to/from stand Pt Will Perform Lower Body Dressing: with modified independence;with adaptive equipment;sitting/lateral leans;sit to/from stand Pt Will Transfer to Toilet: with min assist;ambulating Pt Will Perform Toileting - Clothing Manipulation and hygiene: with modified independence;with adaptive equipment;sitting/lateral leans;sit to/from stand Additional ADL Goal #1: Pt will problem solve 3 fall prevention techniques that she can use at home.  Plan Discharge plan remains appropriate;Frequency remains appropriate    Co-evaluation                 AM-PAC OT "6 Clicks" Daily Activity     Outcome Measure   Help from another person eating meals?: A Little Help from another person taking care of personal grooming?: A Little Help from another person toileting, which includes using toliet, bedpan, or urinal?: A Lot Help from another person bathing (including washing, rinsing, drying)?: A Lot Help from another  person to put on and taking off regular upper body clothing?: A Little Help from another person to put on and taking off regular lower body clothing?: A Lot 6 Click Score: 15    End of Session Equipment Utilized During Treatment: Gait belt;Rolling walker (2 wheels)  OT Visit Diagnosis: Unsteadiness on feet (R26.81);Other abnormalities of gait and mobility (R26.89);Muscle weakness (generalized) (M62.81);History of falling (Z91.81)   Activity Tolerance Patient limited by pain   Patient Left in bed;with call bell/phone within reach;with bed alarm set   Nurse Communication Mobility status        Time: 5697-9480 OT Time Calculation (min): 24 min  Charges: OT General Charges $OT Visit: 1 Visit OT Treatments $Self Care/Home Management : 8-22 mins $Therapeutic Activity: 8-22 mins  Brynlei Klausner H., OTR/L Acute Rehabilitation  Patrice Matthew Elane Idabell Picking 01/26/2021, 4:11 PM

## 2021-01-26 NOTE — Progress Notes (Signed)
Mobility Specialist Progress Note   01/26/21 1200  Mobility  Activity Ambulated in room;Transferred:  Bed to chair;Transferred:  Chair to bed  Level of Assistance Moderate assist, patient does 50-74%  LLE Weight Bearing WBAT  Distance Ambulated (ft) 6 ft  Mobility Ambulated with assistance in room;Sit up in bed/chair position for meals  Mobility Response Tolerated well  Mobility performed by Mobility specialist  $Mobility charge 1 Mobility   Received pt in bed hesitant about mobility session because of episodes of nausea, but agreeable. Bed mobility is minA with support to LLE to EOB, while sitting EOB pt c/o light headedness so BP was taken and recorded below. Pt had urges to throw up throughout session but was never successful. STS was modA for pt's L knee initially gave out and there was a need to block them while standing but no more instances after that. Sat in chair for ~60min and demanded that they needed to go back to bed d/t being uncomfortable. Returned back to bed w/ call bell by side and bed alarm on.     During Mobility: 77 HR, 143/62 BP  Holland Falling Mobility Specialist Phone Number [phone]

## 2021-01-26 NOTE — Progress Notes (Signed)
PROGRESS NOTE    [name] [name]  DZH:[id number] DOB: 02-23-1930 DOA: 01/20/2021 PCP: Ginger Organ., MD    Chief Complaint  Patient presents with   Fall    Brief Narrative:  87/F with medical history of dementia, CKD 4, diabetes mellitus, hypertension, chronic diastolic CHF, left lower extremity DVT on anticoagulation -Presented to the ED after mechanical fall, and history of left hip/knee pain -Work-up in the ED was notable for left fracture, creatinine up to 3.2 from baseline around 2.1 -Underwent surgical repair 11/12   Assessment & Plan:   Principal Problem:   Closed left hip fracture, initial encounter ([place]) Active Problems:   DM2 (diabetes mellitus, type 2) ([place])   Essential hypertension   CKD (chronic kidney disease) stage 4, GFR 15-29 ml/min ([facility])   Alzheimer's dementia ([place])  Closed left hip fracture, initial encounter [facility]) -Orthopedics consulted.   -Patient status post IM nail 01/21/2021 per Dr. [name].  -Patient assessed by PT who are recommending SNF placement for short-term rehab.   -Continue Eliquis.   -Will need SNF on discharge.  O  AKI on CKD4 -Baseline creatinine is 2.1, creatinine 3.3 on admission -Renal function improved with IV fluids and holding ARB and Lasix.  -Creatinine currently at 1.7.   -IV fluids discontinued.   -Diuretics to be resumed on discharge.    Type 2 diabetes mellitus -CBG 134 this morning.   -Hemoglobin A1c 7.9 (01/20/2021). -Continue to hold oral hypoglycemic agents. -SSI.  -Resume home regimen on discharge.   Hypertension -blood pressure was trending up and as such hydralazine resumed, patient on beta-blockers, continue to hold clonidine.   -ARB and Lasix on hold.     Chronic diastolic CHF Mild pulmonary hypertension -IV fluids discontinued.   -Continue to hold diuretics as patient with nausea and emesis.   History of DVT -Continue Eliquis.   Anxiety, depression -Continue Valium as needed.     History of  dementia -Delirium improved on delirium precautions.   -Stable.    Nausea and emesis -Patient with nausea and emesis this morning per RN unable to keep anything down.   -No improvement despite IV Zofran.   -Abdominal films obtained with normal bowel gas pattern.   -IV Compazine as needed.     DVT prophylaxis: Eliquis Code Status: Full Family Communication: Updated patient.  No family at bedside. Disposition:   Status is: Inpatient  Remains inpatient appropriate because: Severity of illness.       Consultants:  Orthopedics: Dr.Xu 01/21/2021  Procedures:  CT left knee 01/20/2021 CT head CT C-spine 01/20/2021 Abdominal films 01/26/2021 Plain films of the left femur 01/21/2021 Plain films of the left knee 01/20/2021 Plain films of the left hip and pelvis 01/20/2021 Open treatment of intertrochanteric fracture with intramedullary implant per Dr. [name] 01/21/2021  Antimicrobials:  IV Ancef 01/21/2021>>>> 01/22/2021   Subjective: Sleeping after receiving IV Compazine.  Per RN patient noted to be nauseous with emesis this morning despite IV Zofran.  Some improvement with nausea after IV Compazine.  No chest pain.  No headache  Objective: Vitals:   01/25/21 2121 01/26/21 0448 01/26/21 0813 01/26/21 1115  BP: (!) 158/59 (!) 165/74 (!) 185/70 (!) 165/71  Pulse: 72 (!) 109 80 100  Resp: 18 15 20 17   Temp: 98.4 F (36.9 C) 98.4 F (36.9 C) 98.6 F (37 C) 98 F (36.7 C)  TempSrc: Oral  Oral   SpO2: 100% 98% 99% 97%  Weight:      Height:  No intake or output data in the 24 hours ending 01/26/21 1254 Filed Weights   01/21/21 1743 01/22/21 0500 01/23/21 0500  Weight: 75.3 kg 76.8 kg 77.5 kg    Examination:  General exam: Appears calm and comfortable  Respiratory system: Clear to auscultation anterior lung fields.  No wheezes, no crackles, no rhonchi.Marland Kitchen Respiratory effort normal. Cardiovascular system: S1 & S2 heard, RRR. No JVD, murmurs, rubs, gallops or clicks.  No pedal edema. Gastrointestinal system: Abdomen is nondistended, soft and nontender. No organomegaly or masses felt. Normal bowel sounds heard. Central nervous system: Alert and oriented. No focal neurological deficits. Extremities: Symmetric 5 x 5 power. Skin: No rashes, lesions or ulcers Psychiatry: Judgement and insight appear normal. Mood & affect appropriate.     Data Reviewed: I have personally reviewed following labs and imaging studies  CBC: Recent Labs  Lab 01/20/21 1823 01/22/21 0205 01/23/21 0203 01/24/21 0323 01/26/21 0156  WBC 13.4* 9.0 11.3* 8.6 8.8  NEUTROABS 10.8*  --   --   --   --   HGB 12.2 8.8* 8.5* 9.0* 9.7*  HCT 38.1 27.0* 25.5* 27.3* 29.9*  MCV 93.4 94.1 93.4 93.8 94.9  PLT 180 140* 136* 155 196    Basic Metabolic Panel: Recent Labs  Lab 01/22/21 0205 01/23/21 0203 01/24/21 0323 01/25/21 0154 01/26/21 0156  NA 135 135 136 133* 137  K 3.4* 3.8 4.0 3.8 4.3  CL 105 109 108 105 108  CO2 22 20* 22 21* 22  GLUCOSE 143* 146* 149* 161* 145*  BUN 33* 26* 22 23 21   CREATININE 2.42* 1.98* 1.71* 1.56* 1.72*  CALCIUM 8.8* 9.2 9.5 9.6 10.1    GFR: Estimated Creatinine Clearance: 21.8 mL/min (A) (by C-G formula based on SCr of 1.72 mg/dL (H)).  Liver Function Tests: Recent Labs  Lab 01/20/21 1823  AST 29  ALT 20  ALKPHOS 81  BILITOT 1.4*  PROT 6.8  ALBUMIN 3.8    CBG: Recent Labs  Lab 01/25/21 2121 01/26/21 0006 01/26/21 0451 01/26/21 0814 01/26/21 1114  GLUCAP 145* 135* 134* 157* 154*     Recent Results (from the past 240 hour(s))  Resp Panel by RT-PCR (Flu A&B, Covid) Nasopharyngeal Swab     Status: None   Collection Time: 01/20/21  6:24 PM   Specimen: Nasopharyngeal Swab; Nasopharyngeal(NP) swabs in vial transport medium  Result Value Ref Range Status   SARS Coronavirus 2 by RT PCR NEGATIVE NEGATIVE Final    Comment: (NOTE) SARS-CoV-2 target nucleic acids are NOT DETECTED.  The SARS-CoV-2 RNA is generally detectable in upper  respiratory specimens during the acute phase of infection. The lowest concentration of SARS-CoV-2 viral copies this assay can detect is 138 copies/mL. A negative result does not preclude SARS-Cov-2 infection and should not be used as the sole basis for treatment or other patient management decisions. A negative result may occur with  improper specimen collection/handling, submission of specimen other than nasopharyngeal swab, presence of viral mutation(s) within the areas targeted by this assay, and inadequate number of viral copies(<138 copies/mL). A negative result must be combined with clinical observations, patient history, and epidemiological information. The expected result is Negative.  Fact Sheet for Patients:  EntrepreneurPulse.com.au  Fact Sheet for Healthcare Providers:  IncredibleEmployment.be  This test is no t yet approved or cleared by the [place] FDA and  has been authorized for detection and/or diagnosis of SARS-CoV-2 by FDA under an Emergency Use Authorization (EUA). This EUA will remain  in effect (meaning this  test can be used) for the duration of the COVID-19 declaration under Section 564(b)(1) of the Act, 21 U.S.C.section 360bbb-3(b)(1), unless the authorization is terminated  or revoked sooner.       Influenza A by PCR NEGATIVE NEGATIVE Final   Influenza B by PCR NEGATIVE NEGATIVE Final    Comment: (NOTE) The Xpert Xpress SARS-CoV-2/FLU/RSV plus assay is intended as an aid in the diagnosis of influenza from Nasopharyngeal swab specimens and should not be used as a sole basis for treatment. Nasal washings and aspirates are unacceptable for Xpert Xpress SARS-CoV-2/FLU/RSV testing.  Fact Sheet for Patients: EntrepreneurPulse.com.au  Fact Sheet for Healthcare Providers: IncredibleEmployment.be  This test is not yet approved or cleared by the [place] FDA and has been  authorized for detection and/or diagnosis of SARS-CoV-2 by FDA under an Emergency Use Authorization (EUA). This EUA will remain in effect (meaning this test can be used) for the duration of the COVID-19 declaration under Section 564(b)(1) of the Act, 21 U.S.C. section 360bbb-3(b)(1), unless the authorization is terminated or revoked.  Performed at [facility], [place] [address] [place], [place] [address]   Surgical pcr screen     Status: None   Collection Time: 01/21/21  8:36 AM   Specimen: Nasal Mucosa; Nasal Swab  Result Value Ref Range Status   MRSA, PCR NEGATIVE NEGATIVE Final   Staphylococcus aureus NEGATIVE NEGATIVE Final    Comment: (NOTE) The Xpert SA Assay (FDA approved for NASAL specimens in patients 22 years of age and older), is one component of a comprehensive surveillance program. It is not intended to diagnose infection nor to guide or monitor treatment. Performed at [facility], [place] [address] [place], [place] [address]   SARS CORONAVIRUS 2 (TAT 6-24 HRS) Nasopharyngeal Nasopharyngeal Swab     Status: None   Collection Time: 01/25/21  6:35 PM   Specimen: Nasopharyngeal Swab  Result Value Ref Range Status   SARS Coronavirus 2 NEGATIVE NEGATIVE Final    Comment: (NOTE) SARS-CoV-2 target nucleic acids are NOT DETECTED.  The SARS-CoV-2 RNA is generally detectable in upper and lower respiratory specimens during the acute phase of infection. Negative results do not preclude SARS-CoV-2 infection, do not rule out co-infections with other pathogens, and should not be used as the sole basis for treatment or other patient management decisions. Negative results must be combined with clinical observations, patient history, and epidemiological information. The expected result is Negative.  Fact Sheet for Patients: SugarRoll.be  Fact Sheet for Healthcare Providers: https://www.woods-mathews.com/  This test is not yet  approved or cleared by the [place] FDA and  has been authorized for detection and/or diagnosis of SARS-CoV-2 by FDA under an Emergency Use Authorization (EUA). This EUA will remain  in effect (meaning this test can be used) for the duration of the COVID-19 declaration under Se ction 564(b)(1) of the Act, 21 U.S.C. section 360bbb-3(b)(1), unless the authorization is terminated or revoked sooner.  Performed at [facility], [place] [address] [place], [place] [address]          Radiology Studies: No results found.      Scheduled Meds:  apixaban  2.5 mg Oral BID   feeding supplement (GLUCERNA SHAKE)  237 mL Oral TID BM   hydrALAZINE  25 mg Oral BID   insulin aspart  0-9 Units Subcutaneous Q4H   metoprolol tartrate  25 mg Oral BID   multivitamin with minerals  1 tablet Oral Daily   pantoprazole  40 mg Oral QHS   senna-docusate  1 tablet Oral BID   simvastatin  40 mg Oral QPM   Continuous Infusions:  methocarbamol (ROBAXIN) IV       LOS: 6 days    Time spent: 35 minutes    Irine Seal, MD [facility]   To contact the attending provider between 7A-7P or the covering provider during after hours 7P-7A, please log into the web site [email or web address] and access using universal [place] password for that web site. If you do not have the password, please call the hospital operator.  01/26/2021, 12:54 PM

## 2021-01-26 NOTE — Progress Notes (Signed)
Nutrition Follow-up  DOCUMENTATION CODES:   Not applicable  INTERVENTION:  -continue Glucerna Shake po TID, each supplement provides 220 kcal and 10 grams of protein -continue MVI with minerals daily  NUTRITION DIAGNOSIS:   Increased nutrient needs related to post-op healing as evidenced by estimated needs.  ongoing  GOAL:   Patient will meet greater than or equal to 90% of their needs  progressing  MONITOR:   PO intake, Supplement acceptance, Diet advancement, Weight trends, Skin, I & O's  REASON FOR ASSESSMENT:   Consult Assessment of nutrition requirement/status, Hip fracture protocol  ASSESSMENT:   [name] [name] is a 87 y.o. female with medical history significant of Alzheimer's dz, DM2, HTN, CKD 4, chronic anticoagulation on eliquis (?prior DVT?).  Pt admitted with L hip fx and underwent open treatment of intertrochanteric fracture with intramedullary implant.  Pt unavailable at time of RD visit. Will attempt follow-up tomorrow to obtain diet/weight history and NFPE.  Per RN, pt has been c/o nausea and intake has suffered as a result. Last 3 meal completions charted as 25-100% (58% avg meal intake).   No UOP documented x24 hours I/O: -1218ml since admit  Current weight: 77.5 kg Admit weight: 75.3 kg   Medications: Scheduled Meds:  apixaban  2.5 mg Oral BID   feeding supplement (GLUCERNA SHAKE)  237 mL Oral TID BM   hydrALAZINE  25 mg Oral BID   insulin aspart  0-9 Units Subcutaneous Q4H   metoprolol tartrate  25 mg Oral BID   multivitamin with minerals  1 tablet Oral Daily   pantoprazole  40 mg Oral QHS   senna-docusate  1 tablet Oral BID   simvastatin  40 mg Oral QPM  Continuous Infusions:  methocarbamol (ROBAXIN) IV     Labs: Recent Labs  Lab 01/24/21 0323 01/25/21 0154 01/26/21 0156  NA 136 133* 137  K 4.0 3.8 4.3  CL 108 105 108  CO2 22 21* 22  BUN 22 23 21   CREATININE 1.71* 1.56* 1.72*  CALCIUM 9.5 9.6 10.1  GLUCOSE 149* 161* 145*   CBGs: 134-157 x24 hours  Diet Order:   Diet Order             Diet Carb Modified Fluid consistency: Thin; Room service appropriate? Yes  Diet effective now                   EDUCATION NEEDS:   Not appropriate for education at this time  Skin:  Skin Assessment: Skin Integrity Issues: Skin Integrity Issues:: Incisions Incisions: L leg  Last BM:  11/16  Height:   Ht Readings from Last 1 Encounters:  01/21/21 5\' 3"  (1.6 m)    Weight:   Wt Readings from Last 1 Encounters:  01/23/21 77.5 kg    Ideal Body Weight:  56.8 kg  BMI:  Body mass index is 30.27 kg/m.  Estimated Nutritional Needs:   Kcal:  1500-1700  Protein:  75-90 grams  Fluid:  > 1.5 L     Melville Engen A., MS, RD, LDN (she/her/hers) RD pager number and weekend/on-call pager number located in [place].

## 2021-01-26 NOTE — Plan of Care (Signed)
  Problem: Activity: Goal: Risk for activity intolerance will decrease Outcome: Progressing   Problem: Nutrition: Goal: Adequate nutrition will be maintained Outcome: Progressing   Problem: Coping: Goal: Level of anxiety will decrease Outcome: Progressing   Problem: Elimination: Goal: Will not experience complications related to bowel motility Outcome: Progressing   

## 2021-01-26 NOTE — Progress Notes (Signed)
Physical Therapy Treatment Patient Details Name: [name] [name] MRN: [id number] DOB: July 07, 1929 Today's Date: 01/26/2021   History of Present Illness BRITNI DRISCOLL is a 87 y.o. female who presented to the ED after a fall resulting in a L intertrochanteric hip fx; s/p IM Nail, WBAT; with medical history significant of Alzheimer's dz, DM2, HTN, CKD 4, chronic anticoagulation on eliquis (?prior DVT?).    PT Comments    Continuing work on functional mobility and activity tolerance;  Attempted to get up to EOB, however more nausea and dry heaving, and we opted to lay back down; Even so uncomfortable with nausea pt participated fully in hip therapeutic exercises  Recommendations for follow up therapy are one component of a multi-disciplinary discharge planning process, led by the attending physician.  Recommendations may be updated based on patient status, additional functional criteria and insurance authorization.  Follow Up Recommendations  Skilled nursing-short term rehab (<3 hours/day)     Assistance Recommended at Discharge Frequent or constant Supervision/Assistance  Equipment Recommendations  Rolling walker (2 wheels);BSC/3in1    Recommendations for Other Services       Precautions / Restrictions Precautions Precautions: Fall Precaution Comments: Consider orthostatics; BP drop with upright activity on 11/16 Restrictions Weight Bearing Restrictions: Yes LLE Weight Bearing: Weight bearing as tolerated Other Position/Activity Restrictions: WBAT LLE     Mobility  Bed Mobility Overal bed mobility: Needs Assistance Bed Mobility: Supine to Sit           General bed mobility comments: Initiated getting up to EOB after therex today, and pt almost immediately began dry heaving    Transfers                   General transfer comment: Held due to nausea an dvomiting    Ambulation/Gait               General Gait Details: held due to nausea and  vomiting   Stairs             Wheelchair Mobility    Modified Rankin (Stroke Patients Only)       Balance     Sitting balance-Leahy Scale: Fair       Standing balance-Leahy Scale: Poor                              Cognition Arousal/Alertness: Awake/alert Behavior During Therapy: WFL for tasks assessed/performed Overall Cognitive Status: Within Functional Limits for tasks assessed                                          Exercises Total Joint Exercises Ankle Circles/Pumps: AROM;Both;20 reps Quad Sets: AROM;Left;15 reps Gluteal Sets: AROM;Both;15 reps Towel Squeeze: AROM;Both;15 reps Heel Slides: AAROM;Left;15 reps Hip ABduction/ADduction: AAROM;Left;15 reps    General Comments General comments (skin integrity, edema, etc.): Pt asking about brushing her teeth; took a few minutes to get the minty small green mouth sponge to allow her to clean her mouth a bit      Pertinent Vitals/Pain Pain Assessment: Faces Faces Pain Scale: Hurts even more Pain Location: L Hip, but her nausea more worrisome for her today Pain Descriptors / Indicators: Aching;Discomfort;Grimacing;Guarding Pain Intervention(s): Monitored during session    Home Living  Prior Function            PT Goals (current goals can now be found in the care plan section) Acute Rehab PT Goals Patient Stated Goal: get rid of the nausea adn vomiting PT Goal Formulation: With patient Time For Goal Achievement: 02/05/21 Potential to Achieve Goals: Good Progress towards PT goals: Progressing toward goals (still with signifcant nausea)    Frequency    Min 3X/week      PT Plan Current plan remains appropriate    Co-evaluation              AM-PAC PT "6 Clicks" Mobility   Outcome Measure  Help needed turning from your back to your side while in a flat bed without using bedrails?: A Lot Help needed moving from lying on your  back to sitting on the side of a flat bed without using bedrails?: A Lot Help needed moving to and from a bed to a chair (including a wheelchair)?: A Lot Help needed standing up from a chair using your arms (e.g., wheelchair or bedside chair)?: A Lot Help needed to walk in hospital room?: A Lot Help needed climbing 3-5 steps with a railing? : A Lot 6 Click Score: 12    End of Session   Activity Tolerance: Patient tolerated treatment well;Other (comment) (still participated well in therex despite nausea) Patient left: in bed;with call bell/phone within reach;with bed alarm set Nurse Communication: Mobility status (N/V) PT Visit Diagnosis: Unsteadiness on feet (R26.81);Other abnormalities of gait and mobility (R26.89);Pain Pain - Right/Left: Left Pain - part of body: Hip     Time: 7026-3785 PT Time Calculation (min) (ACUTE ONLY): 22 min  Charges:  $Therapeutic Exercise: 8-22 mins                     Roney Marion, PT  Acute Rehabilitation Services Pager [phone] Office [place] 01/26/2021, 12:10 PM

## 2021-01-27 DIAGNOSIS — W19XXXA Unspecified fall, initial encounter: Secondary | ICD-10-CM

## 2021-01-27 DIAGNOSIS — R112 Nausea with vomiting, unspecified: Secondary | ICD-10-CM

## 2021-01-27 LAB — BASIC METABOLIC PANEL
Anion gap: 10 (ref 5–15)
BUN: 23 mg/dL (ref 8–23)
CO2: 22 mmol/L (ref 22–32)
Calcium: 10.6 mg/dL — ABNORMAL HIGH (ref 8.9–10.3)
Chloride: 105 mmol/L (ref 98–111)
Creatinine, Ser: 1.79 mg/dL — ABNORMAL HIGH (ref 0.44–1.00)
GFR, Estimated: 27 mL/min — ABNORMAL LOW (ref >60)
Glucose, Bld: 173 mg/dL — ABNORMAL HIGH (ref 70–99)
Potassium: 4.7 mmol/L (ref 3.5–5.1)
Sodium: 137 mmol/L (ref 135–145)

## 2021-01-27 LAB — CBC
HCT: 33.4 % — ABNORMAL LOW (ref 36.0–46.0)
Hemoglobin: 10.7 g/dL — ABNORMAL LOW (ref 12.0–15.0)
MCH: 30.4 pg (ref 26.0–34.0)
MCHC: 32 g/dL (ref 30.0–36.0)
MCV: 94.9 fL (ref 80.0–100.0)
Platelets: 281 10*3/uL (ref 150–400)
RBC: 3.52 MIL/uL — ABNORMAL LOW (ref 3.87–5.11)
RDW: 14 % (ref 11.5–15.5)
WBC: 9.4 10*3/uL (ref 4.0–10.5)
nRBC: 0 % (ref 0.0–0.2)

## 2021-01-27 LAB — GLUCOSE, CAPILLARY
Glucose-Capillary: 165 mg/dL — ABNORMAL HIGH (ref 70–99)
Glucose-Capillary: 138 mg/dL — ABNORMAL HIGH (ref 70–99)
Glucose-Capillary: 186 mg/dL — ABNORMAL HIGH (ref 70–99)
Glucose-Capillary: 197 mg/dL — ABNORMAL HIGH (ref 70–99)
Glucose-Capillary: 190 mg/dL — ABNORMAL HIGH (ref 70–99)
Glucose-Capillary: 173 mg/dL — ABNORMAL HIGH (ref 70–99)
Glucose-Capillary: 182 mg/dL — ABNORMAL HIGH (ref 70–99)

## 2021-01-27 LAB — MAGNESIUM: Magnesium: 1.9 mg/dL (ref 1.7–2.4)

## 2021-01-27 MED ORDER — ONDANSETRON HCL 4 MG/2ML IJ SOLN
4.0000 mg | Freq: Three times a day (TID) | INTRAMUSCULAR | Status: DC
  Administered 2021-01-27 – 2021-01-28 (×4): 4 mg via INTRAVENOUS
  Filled 2021-01-27 (×4): qty 2

## 2021-01-27 MED ORDER — SODIUM CHLORIDE 0.9 % IV BOLUS
500.0000 mL | Freq: Once | INTRAVENOUS | Status: AC
  Administered 2021-01-27: 500 mL via INTRAVENOUS

## 2021-01-27 MED ORDER — METOPROLOL TARTRATE 12.5 MG HALF TABLET
12.5000 mg | ORAL_TABLET | Freq: Two times a day (BID) | ORAL | Status: DC
  Administered 2021-01-27 – 2021-01-28 (×2): 12.5 mg via ORAL
  Filled 2021-01-27 (×2): qty 1

## 2021-01-27 MED ORDER — HYDRALAZINE HCL 25 MG PO TABS
25.0000 mg | ORAL_TABLET | Freq: Every day | ORAL | Status: DC
  Administered 2021-01-28: 25 mg via ORAL
  Filled 2021-01-27: qty 1

## 2021-01-27 MED ORDER — SODIUM CHLORIDE 0.9 % IV SOLN: INTRAVENOUS | Status: DC

## 2021-01-27 NOTE — Progress Notes (Signed)
Physical Therapy Treatment Patient Details Name: [name] [name] MRN: [id number] DOB: 04-30-1929 Today's Date: 01/27/2021   History of Present Illness [name] [name] is a 87 y.o. female who presented to the ED after a fall resulting in a L intertrochanteric hip fx; s/p IM Nail, WBAT; with medical history significant of Alzheimer's dz, DM2, HTN, CKD 4, chronic anticoagulation on eliquis (?prior DVT?).    PT Comments    Pt received in bed. She required mod assist bed mobility, and mod assist sit to stand with RW. Unable to progress to recliner due to +orthostatics. In standing, pt with c/o dizziness and nausea. Pt required return to supine. Pt encouraged to keep HOB elevated throughout day.                                                      BP Supine prior to mobility        165/80 Sitting                                      118/87 Standing                                   79/45 Return to supine                     172/84      Recommendations for follow up therapy are one component of a multi-disciplinary discharge planning process, led by the attending physician.  Recommendations may be updated based on patient status, additional functional criteria and insurance authorization.  Follow Up Recommendations  Skilled nursing-short term rehab (<3 hours/day)     Assistance Recommended at Discharge Frequent or constant Supervision/Assistance    Equipment Recommendations  Rolling walker (2 wheels);BSC/3in1    Recommendations for Other Services       Precautions / Restrictions Precautions Precautions: Fall;Other (comment) Precaution Comments: orthostatic (symptomatic) Restrictions LLE Weight Bearing: Weight bearing as tolerated     Mobility  Bed Mobility Overal bed mobility: Needs Assistance Bed Mobility: Supine to Sit;Sit to Supine     Supine to sit: Mod assist;HOB elevated Sit to supine: Mod assist;HOB elevated   General bed mobility comments: +rail, increased time,  cues for sequencing    Transfers Overall transfer level: Needs assistance Equipment used: Rolling walker (2 wheels) Transfers: Sit to/from Stand Sit to Stand: Mod assist;From elevated surface           General transfer comment: assist to power up and stabilize balance, increased time, cues for sequencing    Ambulation/Gait               General Gait Details: unable due to orthostatic   Stairs             Wheelchair Mobility    Modified Rankin (Stroke Patients Only)       Balance Overall balance assessment: Needs assistance Sitting-balance support: No upper extremity supported;Feet supported Sitting balance-Leahy Scale: Fair Sitting balance - Comments: able to maintain static sitting balance without assist     Standing balance-Leahy Scale: Poor Standing balance comment: reliant on external support  Cognition Arousal/Alertness: Awake/alert Behavior During Therapy: WFL for tasks assessed/performed Overall Cognitive Status: Within Functional Limits for tasks assessed                                 General Comments: internally distracted by nausea        Exercises General Exercises - Lower Extremity Ankle Circles/Pumps: AROM;Both;10 reps;Supine    General Comments General comments (skin integrity, edema, etc.): BP: supine prior to mobility 165/80, sitting 118/87, standing 79/45, return to supine 172 84. HR supine 116, SpO2 on RA 94%      Pertinent Vitals/Pain Pain Assessment: Faces Faces Pain Scale: Hurts little more Pain Location: L hip with mobility Pain Descriptors / Indicators: Grimacing;Guarding;Discomfort Pain Intervention(s): Monitored during session;Limited activity within patient's tolerance;Repositioned    Home Living                          Prior Function            PT Goals (current goals can now be found in the care plan section) Acute Rehab PT Goals Patient  Stated Goal: feel better Progress towards PT goals: Not progressing toward goals - comment (+orthostatics)    Frequency    Min 3X/week      PT Plan Current plan remains appropriate    Co-evaluation              AM-PAC PT "6 Clicks" Mobility   Outcome Measure  Help needed turning from your back to your side while in a flat bed without using bedrails?: A Lot Help needed moving from lying on your back to sitting on the side of a flat bed without using bedrails?: A Lot Help needed moving to and from a bed to a chair (including a wheelchair)?: A Lot Help needed standing up from a chair using your arms (e.g., wheelchair or bedside chair)?: A Lot Help needed to walk in hospital room?: A Lot Help needed climbing 3-5 steps with a railing? : Total 6 Click Score: 11    End of Session Equipment Utilized During Treatment: Gait belt Activity Tolerance: Treatment limited secondary to medical complications (Comment) (+orthostatics) Patient left: in bed;with call bell/phone within reach;with nursing/sitter in room;with SCD's reapplied Nurse Communication: Mobility status;Other (comment) (+orthostatics) PT Visit Diagnosis: Unsteadiness on feet (R26.81);Other abnormalities of gait and mobility (R26.89);Pain Pain - Right/Left: Left Pain - part of body: Hip     Time: 0757-0822 PT Time Calculation (min) (ACUTE ONLY): 25 min  Charges:  $Therapeutic Activity: 23-37 mins                     Lorrin Goodell, PT  Office # [phone] Pager [phone]    Lorriane Shire 01/27/2021, 8:29 AM

## 2021-01-27 NOTE — Progress Notes (Signed)
PROGRESS NOTE    [name] [name]  GEX:[id number] DOB: 1929-07-29 DOA: 01/20/2021 PCP: Ginger Organ., MD    Chief Complaint  Patient presents with   Fall    Brief Narrative:  87/F with medical history of dementia, CKD 4, diabetes mellitus, hypertension, chronic diastolic CHF, left lower extremity DVT on anticoagulation -Presented to the ED after mechanical fall, and history of left hip/knee pain -Work-up in the ED was notable for left fracture, creatinine up to 3.2 from baseline around 2.1 -Underwent surgical repair 11/12   Assessment & Plan:   Principal Problem:   Closed left hip fracture, initial encounter ([place]) Active Problems:   DM2 (diabetes mellitus, type 2) ([place])   Essential hypertension   CKD (chronic kidney disease) stage 4, GFR 15-29 ml/min ([facility])   Alzheimer's dementia ([place])  Closed left hip fracture, initial encounter [facility]) -Orthopedics consulted.   -Patient status post IM nail 01/21/2021 per Dr. [name].  -Patient assessed by PT who are recommending SNF placement for short-term rehab.   -Continue Eliquis.   -Will need SNF on discharge.    AKI on CKD4 -Baseline creatinine is 2.1, creatinine 3.3 on admission -Renal function improved with IV fluids and holding ARB and Lasix.  -Creatinine currently at 1.72.   -Resume IV fluids as patient with orthostasis and ongoing nausea and vomiting.   -Diuretics to be resumed on discharge.   Orthostatic hypotension -Patient noted with orthostasis after being worked with physical therapy. -Patient with nausea and emesis. -Patient symptomatic with complaints of dizziness and lightheadedness. -Placed back on IV fluids. -TED hose. -Check orthostatics in the a.m.   Type 2 diabetes mellitus -CBG 165 this morning.   -Hemoglobin A1c 7.9 (01/20/2021). -Continue to hold oral hypoglycemic agents. -SSI.  -Resume home regimen on discharge.   Hypertension -blood pressure was trending up and as such hydralazine resumed,  patient on beta-blockers, continue to hold clonidine.   -ARB and Lasix on hold.     Chronic diastolic CHF Mild pulmonary hypertension -IV fluids discontinued.   -Continue to hold diuretics as patient with nausea and emesis.   -Place back on IV fluids and monitor fluid status.     History of DVT  Eliquis. -   Anxiety, depression -Valium as needed.     History of dementia -Delirium improved on delirium precautions.   -Currently at baseline.   Nausea and emesis -Patient with nausea and emesis this morning per RN unable to keep anything down.   -No improvement despite IV Zofran.   -Abdominal films obtained with normal bowel gas pattern.   -IV Compazine as needed.  -Placed on scheduled IV Zofran. -Check a gastric emptying study.   DVT prophylaxis: Eliquis Code Status: Full Family Communication: Updated patient.  No family at bedside. Disposition:   Status is: Inpatient  Remains inpatient appropriate because: Severity of illness.       Consultants:  Orthopedics: Dr.Xu 01/21/2021  Procedures:  CT left knee 01/20/2021 CT head CT C-spine 01/20/2021 Abdominal films 01/26/2021 Plain films of the left femur 01/21/2021 Plain films of the left knee 01/20/2021 Plain films of the left hip and pelvis 01/20/2021 Open treatment of intertrochanteric fracture with intramedullary implant per Dr. [name] 01/21/2021  Antimicrobials:  IV Ancef 01/21/2021>>>> 01/22/2021   Subjective: Patient laying in bed complaining of ongoing nausea and emesis.  States when she eats everything comes back up.  No chest pain.  No shortness of breath.  No abdominal pain.  Received IV Compazine this morning.  Complain of  dizziness and lightheadedness with orthostatics  Objective: Vitals:   01/27/21 0419 01/27/21 0500 01/27/21 0733 01/27/21 1127  BP: (!) 171/61  (!) 148/76 (!) 174/70  Pulse: 79  80 76  Resp: 15  18 18   Temp: (!) 97.5 F (36.4 C)  98.8 F (37.1 C) 98.3 F (36.8 C)  TempSrc: Oral   Oral Oral  SpO2: 97%  97% 98%  Weight:  75.7 kg    Height:        Intake/Output Summary (Last 24 hours) at 01/27/2021 1254 Last data filed at 01/27/2021 0957 Gross per 24 hour  Intake --  Output 700 ml  Net -700 ml   Filed Weights   01/22/21 0500 01/23/21 0500 01/27/21 0500  Weight: 76.8 kg 77.5 kg 75.7 kg    Examination:  General exam: Appears calm and comfortable.  Dry mucous membranes. Respiratory system: Clear to auscultation anterior lung fields.  No wheezes, no crackles, no rhonchi.Marland Kitchen Respiratory effort normal. Cardiovascular system: S1 & S2 heard, RRR. No JVD, murmurs, rubs, gallops or clicks. No pedal edema. Gastrointestinal system: Abdomen is nondistended, soft and nontender. No organomegaly or masses felt. Normal bowel sounds heard. Central nervous system: Alert and oriented. No focal neurological deficits. Extremities: Symmetric 5 x 5 power. Skin: No rashes, lesions or ulcers Psychiatry: Judgement and insight appear normal. Mood & affect appropriate.     Data Reviewed: I have personally reviewed following labs and imaging studies  CBC: Recent Labs  Lab 01/20/21 1823 01/22/21 0205 01/23/21 0203 01/24/21 0323 01/26/21 0156 01/27/21 0237  WBC 13.4* 9.0 11.3* 8.6 8.8 9.4  NEUTROABS 10.8*  --   --   --   --   --   HGB 12.2 8.8* 8.5* 9.0* 9.7* 10.7*  HCT 38.1 27.0* 25.5* 27.3* 29.9* 33.4*  MCV 93.4 94.1 93.4 93.8 94.9 94.9  PLT 180 140* 136* 155 232 281     Basic Metabolic Panel: Recent Labs  Lab 01/23/21 0203 01/24/21 0323 01/25/21 0154 01/26/21 0156 01/27/21 0237  NA 135 136 133* 137 137  K 3.8 4.0 3.8 4.3 4.7  CL 109 108 105 108 105  CO2 20* 22 21* 22 22  GLUCOSE 146* 149* 161* 145* 173*  BUN 26* 22 23 21 23   CREATININE 1.98* 1.71* 1.56* 1.72* 1.79*  CALCIUM 9.2 9.5 9.6 10.1 10.6*  MG  --   --   --   --  1.9     GFR: Estimated Creatinine Clearance: 20.8 mL/min (A) (by C-G formula based on SCr of 1.79 mg/dL (H)).  Liver Function  Tests: Recent Labs  Lab 01/20/21 1823  AST 29  ALT 20  ALKPHOS 81  BILITOT 1.4*  PROT 6.8  ALBUMIN 3.8     CBG: Recent Labs  Lab 01/26/21 2337 01/27/21 0417 01/27/21 0748 01/27/21 1231 01/27/21 1241  GLUCAP 146* 165* 138* 197* 186*      Recent Results (from the past 240 hour(s))  Resp Panel by RT-PCR (Flu A&B, Covid) Nasopharyngeal Swab     Status: None   Collection Time: 01/20/21  6:24 PM   Specimen: Nasopharyngeal Swab; Nasopharyngeal(NP) swabs in vial transport medium  Result Value Ref Range Status   SARS Coronavirus 2 by RT PCR NEGATIVE NEGATIVE Final    Comment: (NOTE) SARS-CoV-2 target nucleic acids are NOT DETECTED.  The SARS-CoV-2 RNA is generally detectable in upper respiratory specimens during the acute phase of infection. The lowest concentration of SARS-CoV-2 viral copies this assay can detect is 138 copies/mL. A negative  result does not preclude SARS-Cov-2 infection and should not be used as the sole basis for treatment or other patient management decisions. A negative result may occur with  improper specimen collection/handling, submission of specimen other than nasopharyngeal swab, presence of viral mutation(s) within the areas targeted by this assay, and inadequate number of viral copies(<138 copies/mL). A negative result must be combined with clinical observations, patient history, and epidemiological information. The expected result is Negative.  Fact Sheet for Patients:  EntrepreneurPulse.com.au  Fact Sheet for Healthcare Providers:  IncredibleEmployment.be  This test is no t yet approved or cleared by the [place] FDA and  has been authorized for detection and/or diagnosis of SARS-CoV-2 by FDA under an Emergency Use Authorization (EUA). This EUA will remain  in effect (meaning this test can be used) for the duration of the COVID-19 declaration under Section 564(b)(1) of the Act, 21 U.S.C.section  360bbb-3(b)(1), unless the authorization is terminated  or revoked sooner.       Influenza A by PCR NEGATIVE NEGATIVE Final   Influenza B by PCR NEGATIVE NEGATIVE Final    Comment: (NOTE) The Xpert Xpress SARS-CoV-2/FLU/RSV plus assay is intended as an aid in the diagnosis of influenza from Nasopharyngeal swab specimens and should not be used as a sole basis for treatment. Nasal washings and aspirates are unacceptable for Xpert Xpress SARS-CoV-2/FLU/RSV testing.  Fact Sheet for Patients: EntrepreneurPulse.com.au  Fact Sheet for Healthcare Providers: IncredibleEmployment.be  This test is not yet approved or cleared by the [place] FDA and has been authorized for detection and/or diagnosis of SARS-CoV-2 by FDA under an Emergency Use Authorization (EUA). This EUA will remain in effect (meaning this test can be used) for the duration of the COVID-19 declaration under Section 564(b)(1) of the Act, 21 U.S.C. section 360bbb-3(b)(1), unless the authorization is terminated or revoked.  Performed at [facility], [place] [address], [place],  [address]   Surgical pcr screen     Status: None   Collection Time: 01/21/21  8:36 AM   Specimen: Nasal Mucosa; Nasal Swab  Result Value Ref Range Status   MRSA, PCR NEGATIVE NEGATIVE Final   Staphylococcus aureus NEGATIVE NEGATIVE Final    Comment: (NOTE) The Xpert SA Assay (FDA approved for NASAL specimens in patients 60 years of age and older), is one component of a comprehensive surveillance program. It is not intended to diagnose infection nor to guide or monitor treatment. Performed at [facility], [place] [address], [place], [place] [address]   SARS CORONAVIRUS 2 (TAT 6-24 HRS) Nasopharyngeal Nasopharyngeal Swab     Status: None   Collection Time: 01/25/21  6:35 PM   Specimen: Nasopharyngeal Swab  Result Value Ref Range Status   SARS Coronavirus 2 NEGATIVE NEGATIVE Final     Comment: (NOTE) SARS-CoV-2 target nucleic acids are NOT DETECTED.  The SARS-CoV-2 RNA is generally detectable in upper and lower respiratory specimens during the acute phase of infection. Negative results do not preclude SARS-CoV-2 infection, do not rule out co-infections with other pathogens, and should not be used as the sole basis for treatment or other patient management decisions. Negative results must be combined with clinical observations, patient history, and epidemiological information. The expected result is Negative.  Fact Sheet for Patients: SugarRoll.be  Fact Sheet for Healthcare Providers: https://www.woods-mathews.com/  This test is not yet approved or cleared by the [place] FDA and  has been authorized for detection and/or diagnosis of SARS-CoV-2 by FDA under an Emergency Use Authorization (  EUA). This EUA will remain  in effect (meaning this test can be used) for the duration of the COVID-19 declaration under Se ction 564(b)(1) of the Act, 21 U.S.C. section 360bbb-3(b)(1), unless the authorization is terminated or revoked sooner.  Performed at [facility], [place] [address] [place], [place] [address]           Radiology Studies: DG Abd 2 Views  Result Date: 01/26/2021 CLINICAL DATA:  Emesis EXAM: ABDOMEN - 2 VIEW COMPARISON:  07/25/2009 FINDINGS: Multiple clips in the region of the stomach. Normal bowel gas pattern. No obstruction or free air. ORIF left hip. Pedicle screw and interbody fusion L3-4. Pedicle screws in L5 not connected to L4. IMPRESSION: Normal bowel gas pattern. Electronically Signed   By: Franchot Gallo M.D.   On: 01/26/2021 13:27        Scheduled Meds:  apixaban  2.5 mg Oral BID   feeding supplement (GLUCERNA SHAKE)  237 mL Oral TID BM   [START ON 01/28/2021] hydrALAZINE  25 mg Oral Daily   insulin aspart  0-9 Units Subcutaneous Q4H   metoprolol tartrate  12.5 mg Oral BID    multivitamin with minerals  1 tablet Oral Daily   pantoprazole  40 mg Oral QHS   senna-docusate  1 tablet Oral BID   simvastatin  40 mg Oral QPM   Continuous Infusions:  methocarbamol (ROBAXIN) IV     sodium chloride       LOS: 7 days    Time spent: 35 minutes    Irine Seal, MD [facility]   To contact the attending provider between 7A-7P or the covering provider during after hours 7P-7A, please log into the web site [email or web address] and access using universal [place] password for that web site. If you do not have the password, please call the hospital operator.  01/27/2021, 12:54 PM

## 2021-01-27 NOTE — Plan of Care (Signed)
  Problem: Activity: Goal: Risk for activity intolerance will decrease Outcome: Progressing   Problem: Nutrition: Goal: Adequate nutrition will be maintained Outcome: Progressing   Problem: Coping: Goal: Level of anxiety will decrease Outcome: Progressing   Problem: Elimination: Goal: Will not experience complications related to bowel motility Outcome: Progressing   Problem: Pain Managment: Goal: General experience of comfort will improve Outcome: Progressing   

## 2021-01-28 ENCOUNTER — Inpatient Hospital Stay (HOSPITAL_COMMUNITY): Payer: Medicare Other

## 2021-01-28 LAB — BASIC METABOLIC PANEL
Anion gap: 10 (ref 5–15)
BUN: 27 mg/dL — ABNORMAL HIGH (ref 8–23)
CO2: 21 mmol/L — ABNORMAL LOW (ref 22–32)
Calcium: 10.3 mg/dL (ref 8.9–10.3)
Chloride: 107 mmol/L (ref 98–111)
Creatinine, Ser: 1.67 mg/dL — ABNORMAL HIGH (ref 0.44–1.00)
GFR, Estimated: 29 mL/min — ABNORMAL LOW (ref >60)
Glucose, Bld: 163 mg/dL — ABNORMAL HIGH (ref 70–99)
Potassium: 4.1 mmol/L (ref 3.5–5.1)
Sodium: 138 mmol/L (ref 135–145)

## 2021-01-28 LAB — CBC WITH DIFFERENTIAL/PLATELET
Abs Immature Granulocytes: 0.6 10*3/uL — ABNORMAL HIGH (ref 0.00–0.07)
Basophils Absolute: 0 10*3/uL (ref 0.0–0.1)
Basophils Relative: 0 %
Eosinophils Absolute: 0 10*3/uL (ref 0.0–0.5)
Eosinophils Relative: 0 %
HCT: 32.4 % — ABNORMAL LOW (ref 36.0–46.0)
Hemoglobin: 10.1 g/dL — ABNORMAL LOW (ref 12.0–15.0)
Immature Granulocytes: 6 %
Lymphocytes Relative: 15 %
Lymphs Abs: 1.5 10*3/uL (ref 0.7–4.0)
MCH: 30.1 pg (ref 26.0–34.0)
MCHC: 31.2 g/dL (ref 30.0–36.0)
MCV: 96.7 fL (ref 80.0–100.0)
Monocytes Absolute: 1 10*3/uL (ref 0.1–1.0)
Monocytes Relative: 10 %
Neutro Abs: 7.3 10*3/uL (ref 1.7–7.7)
Neutrophils Relative %: 69 %
Platelets: 294 10*3/uL (ref 150–400)
RBC: 3.35 MIL/uL — ABNORMAL LOW (ref 3.87–5.11)
RDW: 14.1 % (ref 11.5–15.5)
WBC: 10.5 10*3/uL (ref 4.0–10.5)
nRBC: 0 % (ref 0.0–0.2)

## 2021-01-28 LAB — GLUCOSE, CAPILLARY
Glucose-Capillary: 147 mg/dL — ABNORMAL HIGH (ref 70–99)
Glucose-Capillary: 149 mg/dL — ABNORMAL HIGH (ref 70–99)
Glucose-Capillary: 152 mg/dL — ABNORMAL HIGH (ref 70–99)
Glucose-Capillary: 165 mg/dL — ABNORMAL HIGH (ref 70–99)
Glucose-Capillary: 165 mg/dL — ABNORMAL HIGH (ref 70–99)
Glucose-Capillary: 207 mg/dL — ABNORMAL HIGH (ref 70–99)

## 2021-01-28 LAB — MAGNESIUM: Magnesium: 1.9 mg/dL (ref 1.7–2.4)

## 2021-01-28 MED ORDER — PANTOPRAZOLE SODIUM 40 MG IV SOLR
40.0000 mg | INTRAVENOUS | Status: DC
  Administered 2021-01-28 – 2021-01-31 (×3): 40 mg via INTRAVENOUS
  Filled 2021-01-28 (×3): qty 40

## 2021-01-28 MED ORDER — METOCLOPRAMIDE HCL 5 MG/ML IJ SOLN
5.0000 mg | Freq: Four times a day (QID) | INTRAMUSCULAR | Status: DC
  Administered 2021-01-28 – 2021-01-29 (×3): 5 mg via INTRAVENOUS
  Filled 2021-01-28 (×3): qty 2

## 2021-01-28 MED ORDER — METOPROLOL TARTRATE 5 MG/5ML IV SOLN
2.5000 mg | Freq: Three times a day (TID) | INTRAVENOUS | Status: DC
  Administered 2021-01-28 – 2021-01-29 (×2): 2.5 mg via INTRAVENOUS
  Filled 2021-01-28 (×2): qty 5

## 2021-01-28 MED ORDER — SODIUM CHLORIDE 0.9 % IV BOLUS
500.0000 mL | Freq: Once | INTRAVENOUS | Status: AC
  Administered 2021-01-28: 500 mL via INTRAVENOUS

## 2021-01-28 MED ORDER — SODIUM CHLORIDE 0.9 % IV BOLUS
1000.0000 mL | Freq: Once | INTRAVENOUS | Status: DC
Start: 2021-01-28 — End: 2021-01-28

## 2021-01-28 NOTE — Progress Notes (Signed)
   01/28/21 1300  Assess: if the MEWS score is Yellow or Red  Were vital signs taken at a resting state? Yes  Focused Assessment No change from prior assessment  Early Detection of Sepsis Score *See Row Information* Low  MEWS guidelines implemented *See Row Information* Yes  Treat  MEWS Interventions Other (Comment) (nacl 555ml bolus)  Pain Score 0  Complains of Nausea /  Vomiting  Interventions Medication (see MAR)  Constipation interventions Laxative  Nausea relieved by Antiemetic  Take Vital Signs  Increase Vital Sign Frequency  Yellow: Q 2hr X 2 then Q 4hr X 2, if remains yellow, continue Q 4hrs  Escalate  MEWS: Escalate Yellow: discuss with charge nurse/RN and consider discussing with provider and RRT  Notify: Charge Nurse/RN  Name of Charge Nurse/RN Notified Anita,RN  Date Charge Nurse/RN Notified 01/28/21  Time Charge Nurse/RN Notified 1300  Notify: Provider  Provider Name/Title Dr. Grandville Silos  Date Provider Notified 01/28/21  Time Provider Notified 1300  Notification Type Rounds  Notification Reason Other (Comment)  Provider response No new orders  Date of Provider Response 01/28/21  Time of Provider Response 1305

## 2021-01-28 NOTE — Progress Notes (Addendum)
HOSPITAL MEDICINE OVERNIGHT EVENT NOTE    Notified by radiology CT imaging of the abdomen and pelvis revealing dilatation of the distal esophageal lumen with impacted food bolus in the setting of prior fundoplication/hiatal hernia repair.  CT imaging the abdomen pelvis was performed due to intractable nausea and vomiting.    Considering identification of impaction of the esophagus, will make patient n.p.o., continue intravenous fluids and place secure chat consultation for gastroenterology (Dr. Ardis Hughs, [facility]) for evaluation and potential intervention.  Vernelle Emerald  MD [facility]

## 2021-01-28 NOTE — Plan of Care (Signed)
  Problem: Education: Goal: Knowledge of General Education information will improve Description: Including pain rating scale, medication(s)/side effects and non-pharmacologic comfort measures Outcome: Progressing   Problem: Health Behavior/Discharge Planning: Goal: Ability to manage health-related needs will improve Outcome: Progressing   Problem: Clinical Measurements: Goal: Will remain free from infection Outcome: Progressing Goal: Cardiovascular complication will be avoided Outcome: Progressing   Problem: Activity: Goal: Risk for activity intolerance will decrease Outcome: Progressing   Problem: Nutrition: Goal: Adequate nutrition will be maintained Outcome: Progressing   Problem: Coping: Goal: Level of anxiety will decrease Outcome: Progressing   Problem: Elimination: Goal: Will not experience complications related to bowel motility Outcome: Progressing   Problem: Pain Managment: Goal: General experience of comfort will improve Outcome: Progressing

## 2021-01-28 NOTE — Progress Notes (Signed)
Pt refusing cpap for the night, pt made aware to call if they change their mind.

## 2021-01-28 NOTE — Progress Notes (Signed)
PROGRESS NOTE    [name] [name]  YIF:[id number] DOB: May 31, 1929 DOA: 01/20/2021 PCP: [name] [name]., MD    Chief Complaint  Patient presents with   Fall    Brief Narrative:  87/F with medical history of dementia, CKD 4, diabetes mellitus, hypertension, chronic diastolic CHF, left lower extremity DVT on anticoagulation -Presented to the ED after mechanical fall, and history of left hip/knee pain -Work-up in the ED was notable for left fracture, creatinine up to 3.2 from baseline around 2.1 -Underwent surgical repair 11/12   Assessment & Plan:   Principal Problem:   Closed left hip fracture, initial encounter ([place]) Active Problems:   DM2 (diabetes mellitus, type 2) ([name] [name])   Essential hypertension   CKD (chronic kidney disease) stage 4, GFR 15-29 ml/min ([facility])   Alzheimer's dementia ([name] [name])  Closed left hip fracture, initial encounter [facility]) -Orthopedics consulted.   -Patient status post IM nail 01/21/2021 per Dr. [name].  -Patient assessed by PT who are recommending SNF placement for short-term rehab.   -Continue Eliquis.   -Will need SNF on discharge.    AKI on CKD4 -Baseline creatinine is 2.1, creatinine 3.3 on admission -Renal function improved with IV fluids and holding ARB and Lasix.  -Creatinine currently at 1.67.   -Continue IV fluids as patient orthostatic and symptomatic with ongoing nausea and emesis. -Continue to hold diuretics. -Diuretics to be resumed on discharge.   Orthostatic hypotension -Patient noted with orthostasis after being worked with physical therapy. -Patient with nausea and emesis ongoing and poor oral intake.. -Patient symptomatic with complaints of dizziness and lightheadedness with orthostasis. -Given 500 cc normal saline bolus. -Continue IV fluids, TED hose. -Repeat orthostatics in the morning.   Type 2 diabetes mellitus -CBG 165 this morning. -Hemoglobin A1c 7.9 (01/20/2021). -Continue to hold oral hypoglycemic agents. -SSI.   -Resume home regimen on discharge.   Hypertension -blood pressure was trending up and as such hydralazine resumed, patient on beta-blockers, continue to hold clonidine.  -BP improving however patient orthostatic and symptomatic with ongoing nausea and emesis for the past 3 days. -Continue to hold ARB and Lasix. -Decrease hydralazine to 25 mg daily. -Discontinue oral beta-blocker and placed on Lopressor 2.5 mg IV every 8 hours. -IV fluids.    Chronic diastolic CHF Mild pulmonary hypertension -IV fluids discontinued.   -Continue to hold diuretics as patient with nausea and emesis.   -Continue hydration IV fluids and emesis.   History of DVT  Continue Eliquis.  -   Anxiety, depression -Valium as needed.      History of dementia -Delirium improved on delirium precautions.   -Currently at baseline.   Nausea and emesis -Patient with ongoing nausea and emesis, poor oral intake, unable to keep anything down.   -Patient currently on scheduled IV Zofran, IV Compazine as needed however still with ongoing emesis.   -Abdominal films with normal bowel gas pattern.   -CT abdomen and pelvis for further evaluation and management.   -Gastric emptying study ordered however unable to be done until Monday, 01/30/2021.   -Discontinue scheduled Zofran and placed on scheduled IV Reglan for the next 24 hours.     DVT prophylaxis: Eliquis Code Status: Full Family Communication: Updated patient.  No family at bedside. Disposition:   Status is: Inpatient  Remains inpatient appropriate because: Severity of illness.       Consultants:  Orthopedics: Dr.Xu 01/21/2021  Procedures:  CT left knee 01/20/2021 CT head CT C-spine 01/20/2021 Abdominal films 01/26/2021 Plain films of the  left femur 01/21/2021 Plain films of the left knee 01/20/2021 Plain films of the left hip and pelvis 01/20/2021 Open treatment of intertrochanteric fracture with intramedullary implant per Dr. [name]  01/21/2021  Antimicrobials:  IV Ancef 01/21/2021>>>> 01/22/2021   Subjective: Patient laying in bed still with complaints of ongoing nausea and emesis.  87/F she tried to take anything in and it comes back out.  Per RN patient tried pills and threw them back up.  Patient complains of diffuse abdominal pain.  Noted to have some dizziness and lightheadedness with orthostatics.    Objective: Vitals:   01/28/21 0819 01/28/21 1209 01/28/21 1211 01/28/21 1401  BP: (!) 169/66 (!) 147/66 (!) 147/66 (!) 164/76  Pulse: (!) 110 (!) 112 (!) 112 81  Resp: 17 18 18 17   Temp: (!) 97.5 F (36.4 C) 98.1 F (36.7 C) 98.1 F (36.7 C) 98 F (36.7 C)  TempSrc: Oral Oral    SpO2: 95% 95%  100%  Weight:      Height:        Intake/Output Summary (Last 24 hours) at 01/28/2021 1705 Last data filed at 01/28/2021 1500 Gross per 24 hour  Intake 1959.55 ml  Output 900 ml  Net 1059.55 ml    Filed Weights   01/23/21 0500 01/27/21 0500 01/28/21 0500  Weight: 77.5 kg 75.7 kg 77.2 kg    Examination:  General exam: NAD.  Dry mucous membranes.   Respiratory system: Lungs/auscultation bilaterally anterior lung fields.  No wheezes, no crackles, no rhonchi.  Normal respiratory effort  Cardiovascular system: Tachycardia. No JVD.  No lower extremity edema.  Gastrointestinal system: Abdomen is soft, nondistended, diffusely tender to palpation, positive bowel sounds.  No rebound.  No guarding.  Central nervous system: Alert, oriented.  Moving extremities spontaneously.  No focal neurological deficits. Extremities: Symmetric 5 x 5 power. Skin: No rashes, lesions or ulcers Psychiatry: Judgement and insight appear normal. Mood & affect appropriate.     Data Reviewed: I have personally reviewed following labs and imaging studies  CBC: Recent Labs  Lab 01/23/21 0203 01/24/21 0323 01/26/21 0156 01/27/21 0237 01/28/21 0817  WBC 11.3* 8.6 8.8 9.4 10.5  NEUTROABS  --   --   --   --  7.3  HGB 8.5* 9.0*  9.7* 10.7* 10.1*  HCT 25.5* 27.3* 29.9* 33.4* 32.4*  MCV 93.4 93.8 94.9 94.9 96.7  PLT 136* 155 232 281 294     Basic Metabolic Panel: Recent Labs  Lab 01/24/21 0323 01/25/21 0154 01/26/21 0156 01/27/21 0237 01/28/21 0817  NA 136 133* 137 137 138  K 4.0 3.8 4.3 4.7 4.1  CL 108 105 108 105 107  CO2 22 21* 22 22 21*  GLUCOSE 149* 161* 145* 173* 163*  BUN 22 23 21 23  27*  CREATININE 1.71* 1.56* 1.72* 1.79* 1.67*  CALCIUM 9.5 9.6 10.1 10.6* 10.3  MG  --   --   --  1.9 1.9     GFR: Estimated Creatinine Clearance: 22.5 mL/min (A) (by C-G formula based on SCr of 1.67 mg/dL (H)).  Liver Function Tests: No results for input(s): AST, ALT, ALKPHOS, BILITOT, PROT, ALBUMIN in the last 168 hours.   CBG: Recent Labs  Lab 01/27/21 2315 01/28/21 0328 01/28/21 0816 01/28/21 1136 01/28/21 1523  GLUCAP 182* 207* 165* 152* 165*      Recent Results (from the past 240 hour(s))  Resp Panel by RT-PCR (Flu A&B, Covid) Nasopharyngeal Swab     Status: None   Collection Time:  01/20/21  6:24 PM   Specimen: Nasopharyngeal Swab; Nasopharyngeal(NP) swabs in vial transport medium  Result Value Ref Range Status   SARS Coronavirus 2 by RT PCR NEGATIVE NEGATIVE Final    Comment: (NOTE) SARS-CoV-2 target nucleic acids are NOT DETECTED.  The SARS-CoV-2 RNA is generally detectable in upper respiratory specimens during the acute phase of infection. The lowest concentration of SARS-CoV-2 viral copies this assay can detect is 138 copies/mL. A negative result does not preclude SARS-Cov-2 infection and should not be used as the sole basis for treatment or other patient management decisions. A negative result may occur with  improper specimen collection/handling, submission of specimen other than nasopharyngeal swab, presence of viral mutation(s) within the areas targeted by this assay, and inadequate number of viral copies(<138 copies/mL). A negative result must be combined with clinical  observations, patient history, and epidemiological information. The expected result is Negative.  Fact Sheet for Patients:  EntrepreneurPulse.com.au  Fact Sheet for Healthcare Providers:  IncredibleEmployment.be  This test is no t yet approved or cleared by the [place] FDA and  has been authorized for detection and/or diagnosis of SARS-CoV-2 by FDA under an Emergency Use Authorization (EUA). This EUA will remain  in effect (meaning this test can be used) for the duration of the COVID-19 declaration under Section 564(b)(1) of the Act, 21 U.S.C.section 360bbb-3(b)(1), unless the authorization is terminated  or revoked sooner.       Influenza A by PCR NEGATIVE NEGATIVE Final   Influenza B by PCR NEGATIVE NEGATIVE Final    Comment: (NOTE) The Xpert Xpress SARS-CoV-2/FLU/RSV plus assay is intended as an aid in the diagnosis of influenza from Nasopharyngeal swab specimens and should not be used as a sole basis for treatment. Nasal washings and aspirates are unacceptable for Xpert Xpress SARS-CoV-2/FLU/RSV testing.  Fact Sheet for Patients: EntrepreneurPulse.com.au  Fact Sheet for Healthcare Providers: IncredibleEmployment.be  This test is not yet approved or cleared by the [place] FDA and has been authorized for detection and/or diagnosis of SARS-CoV-2 by FDA under an Emergency Use Authorization (EUA). This EUA will remain in effect (meaning this test can be used) for the duration of the COVID-19 declaration under Section 564(b)(1) of the Act, 21 U.S.C. section 360bbb-3(b)(1), unless the authorization is terminated or revoked.  Performed at [facility], [place] [address], [place], [place] [address]   Surgical pcr screen     Status: None   Collection Time: 01/21/21  8:36 AM   Specimen: Nasal Mucosa; Nasal Swab  Result Value Ref Range Status   MRSA, PCR NEGATIVE NEGATIVE Final    Staphylococcus aureus NEGATIVE NEGATIVE Final    Comment: (NOTE) The Xpert SA Assay (FDA approved for NASAL specimens in patients 70 years of age and older), is one component of a comprehensive surveillance program. It is not intended to diagnose infection nor to guide or monitor treatment. Performed at [facility], [place] [address], [place], [place] [address]   SARS CORONAVIRUS 2 (TAT 6-24 HRS) Nasopharyngeal Nasopharyngeal Swab     Status: None   Collection Time: 01/25/21  6:35 PM   Specimen: Nasopharyngeal Swab  Result Value Ref Range Status   SARS Coronavirus 2 NEGATIVE NEGATIVE Final    Comment: (NOTE) SARS-CoV-2 target nucleic acids are NOT DETECTED.  The SARS-CoV-2 RNA is generally detectable in upper and lower respiratory specimens during the acute phase of infection. Negative results do not preclude SARS-CoV-2 infection, do not rule out co-infections with other pathogens, and should  not be used as the sole basis for treatment or other patient management decisions. Negative results must be combined with clinical observations, patient history, and epidemiological information. The expected result is Negative.  Fact Sheet for Patients: SugarRoll.be  Fact Sheet for Healthcare Providers: https://www.woods-mathews.com/  This test is not yet approved or cleared by the [place] FDA and  has been authorized for detection and/or diagnosis of SARS-CoV-2 by FDA under an Emergency Use Authorization (EUA). This EUA will remain  in effect (meaning this test can be used) for the duration of the COVID-19 declaration under Se ction 564(b)(1) of the Act, 21 U.S.C. section 360bbb-3(b)(1), unless the authorization is terminated or revoked sooner.  Performed at [facility], [place] [address] [place], [place] [address]           Radiology Studies: No results found.      Scheduled Meds:  apixaban  2.5 mg Oral BID    feeding supplement (GLUCERNA SHAKE)  237 mL Oral TID BM   hydrALAZINE  25 mg Oral Daily   insulin aspart  0-9 Units Subcutaneous Q4H   metoprolol tartrate  12.5 mg Oral BID   multivitamin with minerals  1 tablet Oral Daily   ondansetron (ZOFRAN) IV  4 mg Intravenous Q8H   pantoprazole  40 mg Oral QHS   senna-docusate  1 tablet Oral BID   simvastatin  40 mg Oral QPM   Continuous Infusions:  sodium chloride 100 mL/hr at 01/28/21 0540   methocarbamol (ROBAXIN) IV       LOS: 8 days    Time spent: 35 minutes    Irine Seal, MD [facility]   To contact the attending provider between 7A-7P or the covering provider during after hours 7P-7A, please log into the web site [email or web address] and access using universal [place] password for that web site. If you do not have the password, please call the hospital operator.  01/28/2021, 5:05 PM

## 2021-01-29 ENCOUNTER — Encounter (HOSPITAL_COMMUNITY): Payer: Self-pay | Admitting: Internal Medicine

## 2021-01-29 ENCOUNTER — Inpatient Hospital Stay (HOSPITAL_COMMUNITY): Payer: Medicare Other | Admitting: Certified Registered Nurse Anesthetist

## 2021-01-29 ENCOUNTER — Encounter (HOSPITAL_COMMUNITY)
Admission: EM | Disposition: A | Discharge status: Skilled Nursing Facility | Payer: Self-pay | Source: Home / Self Care | Attending: Internal Medicine

## 2021-01-29 DIAGNOSIS — W44F3XA Food entering into or through a natural orifice, initial encounter: Secondary | ICD-10-CM | POA: Clinically undetermined

## 2021-01-29 DIAGNOSIS — T18128A Food in esophagus causing other injury, initial encounter: Secondary | ICD-10-CM | POA: Clinically undetermined

## 2021-01-29 HISTORY — PX: ESOPHAGOGASTRODUODENOSCOPY (EGD) WITH PROPOFOL: SHX5813

## 2021-01-29 HISTORY — PX: FOREIGN BODY REMOVAL: SHX962

## 2021-01-29 LAB — BASIC METABOLIC PANEL
Anion gap: 9 (ref 5–15)
BUN: 25 mg/dL — ABNORMAL HIGH (ref 8–23)
CO2: 20 mmol/L — ABNORMAL LOW (ref 22–32)
Calcium: 9.8 mg/dL (ref 8.9–10.3)
Chloride: 111 mmol/L (ref 98–111)
Creatinine, Ser: 1.57 mg/dL — ABNORMAL HIGH (ref 0.44–1.00)
GFR, Estimated: 31 mL/min — ABNORMAL LOW (ref >60)
Glucose, Bld: 146 mg/dL — ABNORMAL HIGH (ref 70–99)
Potassium: 4.1 mmol/L (ref 3.5–5.1)
Sodium: 140 mmol/L (ref 135–145)

## 2021-01-29 LAB — GLUCOSE, CAPILLARY
Glucose-Capillary: 140 mg/dL — ABNORMAL HIGH (ref 70–99)
Glucose-Capillary: 143 mg/dL — ABNORMAL HIGH (ref 70–99)
Glucose-Capillary: 143 mg/dL — ABNORMAL HIGH (ref 70–99)
Glucose-Capillary: 158 mg/dL — ABNORMAL HIGH (ref 70–99)
Glucose-Capillary: 160 mg/dL — ABNORMAL HIGH (ref 70–99)
Glucose-Capillary: 168 mg/dL — ABNORMAL HIGH (ref 70–99)
Glucose-Capillary: 281 mg/dL — ABNORMAL HIGH (ref 70–99)

## 2021-01-29 LAB — CBC WITH DIFFERENTIAL/PLATELET
Abs Immature Granulocytes: 0.59 10*3/uL — ABNORMAL HIGH (ref 0.00–0.07)
Basophils Absolute: 0 10*3/uL (ref 0.0–0.1)
Basophils Relative: 0 %
Eosinophils Absolute: 0 10*3/uL (ref 0.0–0.5)
Eosinophils Relative: 0 %
HCT: 31.8 % — ABNORMAL LOW (ref 36.0–46.0)
Hemoglobin: 10.2 g/dL — ABNORMAL LOW (ref 12.0–15.0)
Immature Granulocytes: 6 %
Lymphocytes Relative: 18 %
Lymphs Abs: 2 10*3/uL (ref 0.7–4.0)
MCH: 30.8 pg (ref 26.0–34.0)
MCHC: 32.1 g/dL (ref 30.0–36.0)
MCV: 96.1 fL (ref 80.0–100.0)
Monocytes Absolute: 1 10*3/uL (ref 0.1–1.0)
Monocytes Relative: 9 %
Neutro Abs: 7.2 10*3/uL (ref 1.7–7.7)
Neutrophils Relative %: 67 %
Platelets: 300 10*3/uL (ref 150–400)
RBC: 3.31 MIL/uL — ABNORMAL LOW (ref 3.87–5.11)
RDW: 14.1 % (ref 11.5–15.5)
WBC: 10.8 10*3/uL — ABNORMAL HIGH (ref 4.0–10.5)
nRBC: 0 % (ref 0.0–0.2)

## 2021-01-29 SURGERY — ESOPHAGOGASTRODUODENOSCOPY (EGD) WITH PROPOFOL
Anesthesia: Monitor Anesthesia Care

## 2021-01-29 MED ORDER — METOPROLOL TARTRATE 5 MG/5ML IV SOLN
2.5000 mg | Freq: Once | INTRAVENOUS | Status: AC
  Administered 2021-01-29: 2.5 mg via INTRAVENOUS
  Filled 2021-01-29: qty 5

## 2021-01-29 MED ORDER — APIXABAN 2.5 MG PO TABS
2.5000 mg | ORAL_TABLET | Freq: Two times a day (BID) | ORAL | Status: DC
  Administered 2021-01-30 – 2021-02-01 (×5): 2.5 mg via ORAL
  Filled 2021-01-29 (×5): qty 1

## 2021-01-29 MED ORDER — MORPHINE SULFATE (PF) 2 MG/ML IV SOLN: 0.5000 mg | INTRAVENOUS | Status: DC | PRN

## 2021-01-29 MED ORDER — PROPOFOL 500 MG/50ML IV EMUL
INTRAVENOUS | Status: DC | PRN
Start: 2021-01-29 — End: 2021-01-29
  Administered 2021-01-29: 100 ug/kg/min via INTRAVENOUS

## 2021-01-29 MED ORDER — ROCURONIUM 10MG/ML (10ML) SYRINGE FOR MEDFUSION PUMP - OPTIME
INTRAVENOUS | Status: DC | PRN
  Administered 2021-01-29: 30 mg via INTRAVENOUS

## 2021-01-29 MED ORDER — SODIUM CHLORIDE 0.9 % IV SOLN: INTRAVENOUS | Status: DC

## 2021-01-29 MED ORDER — METOPROLOL TARTRATE 5 MG/5ML IV SOLN
5.0000 mg | Freq: Three times a day (TID) | INTRAVENOUS | Status: DC
  Administered 2021-01-29 – 2021-01-31 (×4): 5 mg via INTRAVENOUS
  Filled 2021-01-29 (×4): qty 5

## 2021-01-29 MED ORDER — SODIUM CHLORIDE 0.9 % IV SOLN: INTRAVENOUS | Status: DC | PRN

## 2021-01-29 MED ORDER — SUCCINYLCHOLINE CHLORIDE 200 MG/10ML IV SOSY
PREFILLED_SYRINGE | INTRAVENOUS | Status: DC | PRN
  Administered 2021-01-29: 60 mg via INTRAVENOUS

## 2021-01-29 MED ORDER — LIDOCAINE 2% (20 MG/ML) 5 ML SYRINGE
INTRAMUSCULAR | Status: DC | PRN
  Administered 2021-01-29: 40 mg via INTRAVENOUS

## 2021-01-29 MED ORDER — LORAZEPAM 2 MG/ML IJ SOLN: 0.5000 mg | Freq: Three times a day (TID) | INTRAMUSCULAR | Status: DC | PRN

## 2021-01-29 MED ORDER — ESMOLOL HCL 100 MG/10ML IV SOLN
INTRAVENOUS | Status: DC | PRN
  Administered 2021-01-29 (×4): 20 mg via INTRAVENOUS

## 2021-01-29 MED ORDER — SUGAMMADEX SODIUM 200 MG/2ML IV SOLN
INTRAVENOUS | Status: DC | PRN
  Administered 2021-01-29: 200 mg via INTRAVENOUS

## 2021-01-29 MED ORDER — PROPOFOL 10 MG/ML IV BOLUS
INTRAVENOUS | Status: DC | PRN
  Administered 2021-01-29 (×3): 20 mg via INTRAVENOUS

## 2021-01-29 MED ORDER — ONDANSETRON HCL 4 MG/2ML IJ SOLN
INTRAMUSCULAR | Status: DC | PRN
  Administered 2021-01-29: 4 mg via INTRAVENOUS

## 2021-01-29 MED ORDER — ACETAMINOPHEN 650 MG RE SUPP: 325.0000 mg | RECTAL | Status: DC | PRN

## 2021-01-29 MED ORDER — PHENYLEPHRINE 40 MCG/ML (10ML) SYRINGE FOR IV PUSH (FOR BLOOD PRESSURE SUPPORT)
PREFILLED_SYRINGE | INTRAVENOUS | Status: DC | PRN
  Administered 2021-01-29 (×2): 80 ug via INTRAVENOUS

## 2021-01-29 MED ORDER — SODIUM CHLORIDE 0.9 % IV BOLUS
250.0000 mL | Freq: Once | INTRAVENOUS | Status: AC
Start: 2021-01-29 — End: 2021-01-29
  Administered 2021-01-29: 250 mL via INTRAVENOUS

## 2021-01-29 SURGICAL SUPPLY — 15 items

## 2021-01-29 NOTE — Consult Note (Signed)
[facility] Consult  Referring Provider: [facility] Primary Care Physician:  Ginger Organ., MD Primary Gastroenterologist: Dr. Cristina Gong  Reason for Consultation: Esophageal food bolus impaction CAT scan  HPI: [name] [name] is a 87 y.o. female referred for esophageal food bolus impaction noted on CAT scan. Past medical history diabetes, hypertension, chronic kidney disease, Alzheimer's Was on Eliquis, last dose 01/29/2019 2 in the evening Admitted on 01/20/2021 with a mechanical fall, left fracture, status post repair of left hip fracture Nausea, vomiting, not able to tolerate food for 3 days Subsequently underwent CAT scan without contrast on 01/28/2021 which showed dilation of distal esophageal lumen with impacted food bolus in setting of prior fundoplication/hiatal hernia repair  Prior GI work-up, colonoscopy in 2009: 6 tubular adenomas removed  Past Medical History:  Diagnosis Date   Allergic rhinitis, cause unspecified    Asthma    Esophageal reflux    Sleep apnea    on C-pap- Dr young follows   Syncope 2019   Normal LVF, negative monitor   Type II or unspecified type diabetes mellitus without mention of complication, not stated as uncontrolled    Unspecified essential hypertension    mild LVH, normal LVF, grade 2 DD by echo 2019    Past Surgical History:  Procedure Laterality Date   APPENDECTOMY     FIXATION KYPHOPLASTY LUMBAR SPINE     HIATAL HERNIA REPAIR     INTRAMEDULLARY (IM) NAIL INTERTROCHANTERIC Left 01/21/2021   Procedure: INTRAMEDULLARY (IM) NAIL INTERTROCHANTRIC;  Surgeon: Leandrew Koyanagi, MD;  Location: [facility];  Service: Orthopedics;  Laterality: Left;   LUMBAR SPINE SURGERY     TUBAL LIGATION     VAGINAL HYSTERECTOMY      Prior to Admission medications   Medication Sig Start Date End Date Taking? Authorizing Provider  acetaminophen (TYLENOL) 500 MG tablet Take 1,000 mg by mouth every 6 (six) hours as needed for moderate pain or  headache.   Yes [provider]  albuterol (PROAIR HFA) 108 (90 Base) MCG/ACT inhaler 2 puffs every 4-6 hours as directed- rescue Patient taking differently: Inhale 2 puffs into the lungs every 6 (six) hours as needed for wheezing or shortness of breath. 06/27/18  Yes Young, Tarri Fuller D, MD  albuterol (PROVENTIL) (2.5 MG/3ML) 0.083% nebulizer solution Take 3 mLs (2.5 mg total) by nebulization every 6 (six) hours as needed for wheezing or shortness of breath. DX T62.263 06/27/18  Yes Young, Tarri Fuller D, MD  BD PEN NEEDLE NANO U/F 32G X 4 MM MISC USE TO INJECT INSULIN ONCE D 01/16/18  Yes [provider]  Blood Glucose Monitoring Suppl (ONE TOUCH ULTRA SYSTEM KIT) W/DEVICE KIT 1 kit by Does not apply route once.   Yes [provider]  chlorpheniramine-HYDROcodone (TUSSIONEX PENNKINETIC ER) 10-8 MG/5ML SUER Take 5 mLs by mouth every 12 (twelve) hours as needed for cough. 01/07/21  Yes Lacretia Leigh, MD  cloNIDine (CATAPRES) 0.1 MG tablet Take 0.1 mg by mouth 2 (two) times daily.   Yes [provider]  ELIQUIS 2.5 MG TABS tablet Take 2.5 mg by mouth 2 (two) times daily. 08/09/20  Yes [provider]  furosemide (LASIX) 40 MG tablet Take 40 mg by mouth daily as needed for fluid or edema.   Yes [provider]  Lancets MISC by Does not apply route daily.   Yes [provider]  loratadine (CLARITIN) 10 MG tablet Take 10 mg by mouth daily as needed for allergies.   Yes [provider]  metoprolol (LOPRESSOR) 50 MG tablet Take 25 mg by mouth 2 (two) times daily. 01/29/13  Yes [provider]  omeprazole (PRILOSEC) 20 MG capsule Take 20 mg by mouth every evening.   Yes [provider]  ONE TOUCH ULTRA TEST test strip Checks blood sugar 3 times daily 06/21/14  Yes [provider]  oxyCODONE-acetaminophen (PERCOCET) 5-325 MG tablet Take 1-2 tablets by mouth every 8 (eight) hours as needed for severe pain. 01/21/21  Yes Leandrew Koyanagi, MD  simvastatin (ZOCOR) 40 MG tablet Take 40 mg by mouth every evening. 01/15/18  Yes [provider]  TOUJEO SOLOSTAR 300 UNIT/ML SOPN Inject 10 Units into the skin at bedtime. 11/17/17  Yes [provider]  traMADol (ULTRAM) 50 MG tablet Take 1 tablet (50 mg total) by mouth every 6 (six) hours as needed. Patient taking differently: Take 50 mg by mouth every 6 (six) hours as needed for moderate pain. 07/08/17  Yes Montine Circle, PA-C  diazepam (VALIUM) 5 MG tablet Take 1 tablet (5 mg total) by mouth every 12 (twelve) hours as needed for anxiety. 01/25/21   Domenic Polite, MD  hydrALAZINE (APRESOLINE) 50 MG tablet Take 0.5 tablets (25 mg total) by mouth 2 (two) times daily. 01/25/21   Domenic Polite, MD  polyethylene glycol (MIRALAX / GLYCOLAX) 17 g packet Take 17 g by mouth daily as needed for mild constipation. 01/25/21   Domenic Polite, MD  senna-docusate (SENOKOT-S) 8.6-50 MG tablet Take 1 tablet by mouth 2 (two) times daily. 01/25/21   Domenic Polite, MD  telmisartan (MICARDIS) 20 MG tablet Take 1 tablet (20 mg total) by mouth daily. 01/25/21   Domenic Polite, MD    Current Facility-Administered Medications  Medication Dose Route Frequency Provider Last Rate Last Admin   0.9 %  sodium chloride infusion   Intravenous Continuous Eugenie Filler, MD 125 mL/hr at 01/29/21 1032 Rate Change at 01/29/21 1032   acetaminophen (TYLENOL) suppository 325 mg  325 mg Rectal Q4H PRN Eugenie Filler, MD       albuterol (PROVENTIL) (2.5 MG/3ML) 0.083% nebulizer solution 2.5 mg  2.5 mg Nebulization Q6H PRN Leandrew Koyanagi, MD       alum & mag hydroxide-simeth (MAALOX/MYLANTA) 200-200-20 MG/5ML suspension 30 mL  30 mL Oral Q4H PRN Leandrew Koyanagi, MD       insulin aspart (novoLOG) injection 0-9 Units  0-9 Units Subcutaneous Q4H Leandrew Koyanagi, MD   1 Units at 01/29/21 8588   LORazepam (ATIVAN) injection 0.5 mg  0.5 mg Intravenous Q8H PRN Eugenie Filler, MD        menthol-cetylpyridinium (CEPACOL) lozenge 3 mg  1 lozenge Oral PRN Leandrew Koyanagi, MD       Or   phenol (CHLORASEPTIC) mouth spray 1 spray  1 spray Mouth/Throat PRN Leandrew Koyanagi, MD       methocarbamol (ROBAXIN) 500 mg in dextrose 5 % 50 mL IVPB  500 mg Intravenous Q6H PRN Leandrew Koyanagi, MD       metoprolol tartrate (LOPRESSOR) injection 5 mg  5 mg Intravenous Q8H Eugenie Filler, MD       morphine 2 MG/ML injection 0.5 mg  0.5 mg Intravenous Q4H PRN Eugenie Filler, MD       pantoprazole (PROTONIX) injection 40 mg  40 mg Intravenous Q24H Eugenie Filler, MD   40 mg at 01/28/21 1810   prochlorperazine (COMPAZINE) injection 10 mg  10 mg Intravenous Q6H  PRN Eugenie Filler, MD   10 mg at 01/28/21 9476    Allergies as of 01/20/2021 - Review Complete 01/20/2021  Allergen Reaction Noted   Aspirin Nausea And Vomiting 06/27/2010   Codeine Nausea And Vomiting    Ibuprofen Nausea And Vomiting    Meperidine hcl     Tape  01/20/2021   Augmentin [amoxicillin-pot clavulanate]  08/04/2013    Family History  Problem Relation Age of Onset   Lung cancer Brother    Diabetes Mother     Social History   Socioeconomic History   Marital status: Married    Spouse name: Not on file   Number of children: 4   Years of education: Not on file   Highest education level: 10th grade  Occupational History    Comment: Orthoptist  Tobacco Use   Smoking status: Never   Smokeless tobacco: Never  Vaping Use   Vaping Use: Never used  Substance and Sexual Activity   Alcohol use: Never   Drug use: Never   Sexual activity: Not Currently  Other Topics Concern   Not on file  Social History Narrative   06/29/20 lives with husband who had 2 strokes, caregiver 3 hours every day of week in mornings   Social Determinants of Health   Financial Resource Strain: Not on file  Food Insecurity: Not on file  Transportation Needs: Not on file  Physical Activity: Not on file  Stress: Not on file  Social  Connections: Not on file  Intimate Partner Violence: Not on file    Review of Systems: As per HPI  Physical Exam: Vital signs in last 24 hours: Temp:  [97.5 F (36.4 C)-98.3 F (36.8 C)] 97.5 F (36.4 C) (11/20 0742) Pulse Rate:  [64-118] 83 (11/20 0742) Resp:  [14-18] 18 (11/20 0742) BP: (147-169)/(62-82) 169/70 (11/20 0742) SpO2:  [95 %-100 %] 96 % (11/20 0742) Last BM Date: 01/25/21  General:   Alert,  Well-developed, overweight,  pleasant and cooperative in NAD Head:  Normocephalic and atraumatic. Eyes:  Sclera clear, no icterus.   Conjunctiva pink. Ears:  Normal auditory acuity. Nose:  No deformity, discharge,  or lesions. Mouth:  No deformity or lesions.  Oropharynx pink & moist. Neck:  Supple; no masses or thyromegaly. Lungs:  Clear throughout to auscultation.   No wheezes, crackles, or rhonchi. No acute distress. Heart: Tachycardic Extremities:  Without clubbing or edema. Neurologic:  Alert and  oriented x4;  grossly normal neurologically. Skin:  Intact without significant lesions or rashes. Psych:  Alert and cooperative. Normal mood and affect. Abdomen:  Soft, nontender and nondistended. No masses, hepatosplenomegaly or hernias noted. Normal bowel sounds, without guarding, and without rebound.         Lab Results: Recent Labs    01/27/21 0237 01/28/21 0817 01/29/21 0326  WBC 9.4 10.5 10.8*  HGB 10.7* 10.1* 10.2*  HCT 33.4* 32.4* 31.8*  PLT 281 294 300   BMET Recent Labs    01/27/21 0237 01/28/21 0817 01/29/21 0326  NA 137 138 140  K 4.7 4.1 4.1  CL 105 107 111  CO2 22 21* 20*  GLUCOSE 173* 163* 146*  BUN 23 27* 25*  CREATININE 1.79* 1.67* 1.57*  CALCIUM 10.6* 10.3 9.8   LFT No results for input(s): PROT, ALBUMIN, AST, ALT, ALKPHOS, BILITOT, BILIDIR, IBILI in the last 72 hours. PT/INR No results for input(s): LABPROT, INR in the last 72 hours.  Studies/Results: CT ABDOMEN PELVIS WO CONTRAST  Result Date: 01/28/2021 CLINICAL  DATA:   Nausea/vomiting Abdominal pain, acute, nonlocalized EXAM: CT ABDOMEN AND PELVIS WITHOUT CONTRAST TECHNIQUE: Multidetector CT imaging of the abdomen and pelvis was performed following the standard protocol without IV contrast. COMPARISON:  X-ray intraoperative left femur 01/21/2021 FINDINGS: Lower chest: Bilateral trace pleural effusions. Bilateral lower lobe passive atelectasis. Diffuse bronchial wall thickening. The distal esophageal lumen is dilated with impacted food bolus. Hepatobiliary: No focal liver abnormality. Nonspecific hydropic gallbladder. No gallstones, gallbladder wall thickening, or pericholecystic fluid. No biliary dilatation. Pancreas: Diffusely atrophic. No focal lesion. Otherwise normal pancreatic contour. No surrounding inflammatory changes. No main pancreatic ductal dilatation. Spleen: Punctate calcifications likely represent sequelae of prior granulomatous disease. Normal in size without focal abnormality. Adrenals/Urinary Tract: No adrenal nodule bilaterally. Bilateral perinephric stranding. No nephrolithiasis and no hydronephrosis. There is a 1.3 cm hyperdense left renal lesion with a density of 74 Hounsfield units consistent with a hemorrhagic or proteinaceous cyst. No ureterolithiasis or hydroureter. The urinary bladder is unremarkable. Stomach/Bowel: Findings suggestive fundoplication repair with markedly limited evaluation due to streak artifact from the surgical staples. Otherwise stomach is within normal limits. No evidence of bowel wall thickening or dilatation. Vascular/Lymphatic: No abdominal aorta or iliac aneurysm. Severe atherosclerotic plaque of the aorta and its branches. No abdominal, pelvic, or inguinal lymphadenopathy. Reproductive: Status post hysterectomy. No adnexal masses. Other: No intraperitoneal free fluid. No intraperitoneal free gas. No organized fluid collection. Musculoskeletal: No abdominal wall hernia or abnormality. Diffusely decreased bone density. L3 through  L5 laminectomy and posterolateral fusion. No suspicious lytic or blastic osseous lesions. No acute displaced fracture. Multilevel degenerative changes of the spine. Partially visualized intramedullary nail fixation of the proximal left femur in a patient with a known acute intratrochanteric fracture. IMPRESSION: 1. Dilatation of distal esophageal lumen with impacted food bolus in the setting of prior fundoplication/hiatal hernia repair. 2. Bilateral trace pleural effusions. 3.  Aortic Atherosclerosis (ICD10-I70.0). These results will be called to the ordering clinician or representative by the Radiologist Assistant, and communication documented in the PACS or [employer]. Electronically Signed   By: Iven Finn M.D.   On: 01/28/2021 22:15    Impression: Esophageal food bolus impaction Was on Eliquis, last dose yesterday  Recent hip fracture with surgery, diabetes, dementia, chronic kidney disease, hypertension, diastolic CHF, left lower extremity DVT.  Plan: EGD today    LOS: 9 days   Ronnette Juniper, MD  01/29/2021, 11:50 AM

## 2021-01-29 NOTE — Op Note (Signed)
[facility] Patient Name: [name] [name] Procedure Date : 01/29/2021 MRN: [id number] Attending MD: Ronnette Juniper , MD Date of Birth: 11/11/1929 CSN: [id number] Age: 87 Admit Type: Inpatient Procedure:                Upper GI endoscopy Indications:              Foreign body in the esophagus, imapcted food bolus                            impaction Providers:                Ronnette Juniper, MD, Mariana Arn, Tyna Jaksch                            Technician Referring MD:             [facility] Medicines:                Monitored Anesthesia Care Complications:            No immediate complications. Estimated blood loss:                            Minimal. Estimated Blood Loss:     Estimated blood loss was minimal. Procedure:                Pre-Anesthesia Assessment:                           - Prior to the procedure, a History and Physical                            was performed, and patient medications and                            allergies were reviewed. The patient's tolerance of                            previous anesthesia was also reviewed. The risks                            and benefits of the procedure and the sedation                            options and risks were discussed with the patient.                            All questions were answered, and informed consent                            was obtained. Prior Anticoagulants: The patient has                            taken Eliquis (apixaban), last dose was 1 day prior                            to  procedure. ASA Grade Assessment: III - A patient                            with severe systemic disease. After reviewing the                            risks and benefits, the patient was deemed in                            satisfactory condition to undergo the procedure.                           After obtaining informed consent, the endoscope was                            passed under direct vision.  Throughout the                            procedure, the patient's blood pressure, pulse, and                            oxygen saturations were monitored continuously. The                            [id number]-[id number] ([id number]) Olympus endoscope was introduced                            through the mouth, and advanced to the second part                            of duodenum. The upper GI endoscopy was                            accomplished without difficulty. The patient                            tolerated the procedure well. Scope In: Scope Out: Findings:      Food was found in the entire esophagus. Removal of food was       accomplished. An extensive amount of food was noted in the esophagus,       from below cricopharygeus to the GE junction. 8 Roth's net and 1 talon       grasping forceps was used to clear the esophagus. 90% of the food was       removed, 10% was pushed into the gastric cavity. Estimated blood loss       was minimal. It took about 1 hour to clear the esophagus.      LA Grade D (one or more mucosal breaks involving at least 75% of       esophageal circumference) esophagitis with no bleeding was found 20 to       35 cm from the incisors.      The entire examined stomach was normal.      The examined duodenum was normal. Impression:               -  Food in the esophagus. Removal was successful.                           - LA Grade D erosive esophagitis with no bleeding.                           - Normal stomach.                           - Normal examined duodenum.                           - No motiltiy noted in esophagus ?achalasia. She                            will benefit from botox injection vs myotomy as an                            outpatient. Moderate Sedation:      Patient did not receive moderate sedation for this procedure, but       instead received monitored anesthesia care. Recommendation:           - Clear liquid diet.                           - Resume  Eliquis (apixaban) at prior dose tomorrow. Procedure Code(s):        --- Professional ---                           [id number], Esophagogastroduodenoscopy, flexible,                            transoral; with removal of foreign body(s) Diagnosis Code(s):        --- Professional ---                           K80.034J, Food in esophagus causing other injury,                            initial encounter                           K20.80, Other esophagitis without bleeding                           T18.108A, Unspecified foreign body in esophagus                            causing other injury, initial encounter CPT copyright 2019 American Medical Association. All rights reserved. The codes documented in this report are preliminary and upon coder review may  be revised to meet current compliance requirements. Ronnette Juniper, MD 01/29/2021 3:27:11 PM This report has been signed electronically. Number of Addenda: 0

## 2021-01-29 NOTE — TOC Progression Note (Signed)
Transition of Care [facility]) - Progression Note    Patient Details  Name: [name] [name] MRN: [id number] Date of Birth: 01/24/1930  [facility] CM/SW [place] Phone Number: 01/29/2021, 1:20 PM  Clinical Narrative:     CSW received insurance authorization approval for patient. Plan Auth ID# [id number] [place] ID# [id number]. Start date is 11/17-11/21. Next review date is 11/21. Patient has SNF bed at [facility] when medically ready. CSW will continue to follow and assist with patients dc planning needs.       Expected Discharge Plan and Services                                                 Social Determinants of Health (SDOH) Interventions    Readmission Risk Interventions No flowsheet data found.

## 2021-01-29 NOTE — Plan of Care (Signed)
  Problem: Coping: Goal: Level of anxiety will decrease Outcome: Progressing   Problem: Pain Managment: Goal: General experience of comfort will improve Outcome: Progressing   Problem: Safety: Goal: Ability to remain free from injury will improve Outcome: Progressing   Problem: Skin Integrity: Goal: Risk for impaired skin integrity will decrease Outcome: Progressing   

## 2021-01-29 NOTE — Transfer of Care (Signed)
Immediate Anesthesia Transfer of Care Note  Patient: [name] [name]  Procedure(s) Performed: ESOPHAGOGASTRODUODENOSCOPY (EGD) WITH PROPOFOL FOREIGN BODY REMOVAL  Patient Location: PACU  Anesthesia Type:General  Level of Consciousness: awake  Airway & Oxygen Therapy: Patient Spontanous Breathing  Post-op Assessment: Report given to RN and Post -op Vital signs reviewed and stable  Post vital signs: Reviewed and stable  Last Vitals:  Vitals Value Taken Time  BP 147/59 01/29/21 1535  Temp    Pulse 73 01/29/21 1536  Resp 24 01/29/21 1536  SpO2 96 % 01/29/21 1536  Vitals shown include unvalidated device data.  Last Pain:  Vitals:   01/29/21 1341  TempSrc: Temporal  PainSc:          Complications: No notable events documented.

## 2021-01-29 NOTE — Anesthesia Procedure Notes (Signed)
Procedure Name: Intubation Date/Time: 01/29/2021 2:26 PM Performed by: Clearnce Sorrel, CRNA Pre-anesthesia Checklist: Patient identified, Emergency Drugs available, Suction available and Patient being monitored Patient Re-evaluated:Patient Re-evaluated prior to induction Oxygen Delivery Method: Circle System Utilized Preoxygenation: Pre-oxygenation with 100% oxygen Induction Type: IV induction Ventilation: Mask ventilation without difficulty Laryngoscope Size: Mac and 3 Grade View: Grade I Tube type: Oral Tube size: 7.0 mm Number of attempts: 1 Airway Equipment and Method: Stylet and Oral airway Placement Confirmation: ETT inserted through vocal cords under direct vision, positive ETCO2 and breath sounds checked- equal and bilateral Secured at: 20 cm Tube secured with: Tape Dental Injury: Teeth and Oropharynx as per pre-operative assessment

## 2021-01-29 NOTE — Progress Notes (Signed)
Pt doesn't wish to wear CPAP for the night.

## 2021-01-29 NOTE — Plan of Care (Signed)
  Problem: Activity: Goal: Risk for activity intolerance will decrease Outcome: Progressing   Problem: Nutrition: Goal: Adequate nutrition will be maintained Outcome: Progressing   Problem: Coping: Goal: Level of anxiety will decrease Outcome: Progressing   Problem: Elimination: Goal: Will not experience complications related to bowel motility Outcome: Progressing   Problem: Pain Managment: Goal: General experience of comfort will improve Outcome: Progressing   

## 2021-01-29 NOTE — Progress Notes (Signed)
PROGRESS NOTE    [name] [name]  TOI:[id number] DOB: 1930/02/09 DOA: 01/20/2021 PCP: Ginger Organ., MD    Chief Complaint  Patient presents with   Fall    Brief Narrative:  87/F with medical history of dementia, CKD 4, diabetes mellitus, hypertension, chronic diastolic CHF, left lower extremity DVT on anticoagulation -Presented to the ED after mechanical fall, and history of left hip/knee pain -Work-up in the ED was notable for left fracture, creatinine up to 3.2 from baseline around 2.1 -Underwent surgical repair 11/12   Assessment & Plan:   Principal Problem:   Closed left hip fracture, initial encounter ([place]) Active Problems:   Food impaction of esophagus   DM2 (diabetes mellitus, type 2) ([place])   Essential hypertension   CKD (chronic kidney disease) stage 4, GFR 15-29 ml/min (HCC)   Alzheimer's dementia ([place])  Closed left hip fracture, initial encounter [facility]) -Orthopedics consulted.   -Patient status post IM nail 01/21/2021 per Dr. [name].  -Patient assessed by PT who are recommending SNF placement for short-term rehab.   -Continue Eliquis.   -Will need SNF on discharge.    Nausea and emesis likely secondary to food impaction in esophagus. -Patient with ongoing nausea and emesis, poor oral intake, unable to keep anything down x3 days..   -CT abdomen and pelvis done last night with dilation of distal esophageal lumen with impacted food bolus in the setting of prior fundoplication/hiatal hernia repair.  Bilateral trace pleural effusions.   -Abdominal films with normal bowel gas pattern. -Discontinue oral medications.   -Discontinue scheduled Reglan.   -DC gastric emptying study.  -IV Compazine as needed. -Keep NPO. -GI consulted for further evaluation and management.  AKI on CKD4 -Baseline creatinine is 2.1, creatinine 3.3 on admission -Renal function improved with IV fluids and holding ARB and Lasix.  -Creatinine currently at 1.57.   -Continue IV fluids as  patient orthostatic and symptomatic with ongoing nausea and emesis. -Continue to hold diuretics. -Diuretics to be resumed on discharge.   Orthostatic hypotension -Patient noted with orthostasis after being worked with physical therapy. -Patient with nausea and emesis ongoing and poor oral intake.. -Patient symptomatic with complaints of dizziness and lightheadedness with orthostasis. -Patient given IV fluid boluses of normal saline.   -Continue IV fluids, TED hose.   -Repeat orthostatics in the morning.    Type 2 diabetes mellitus -CBG 140 this morning.  -Hemoglobin A1c 7.9 (01/20/2021). -Continue to hold oral hypoglycemic agents. -SSI.  -Resume home regimen on discharge.   Hypertension -blood pressure was trending up and as such hydralazine resumed, patient on beta-blockers, continue to hold clonidine.  -BP improving however patient orthostatic and symptomatic with ongoing nausea and emesis for the past 3-4 days. -Continue to hold ARB and Lasix. -Discontinue hydralazine as patient currently NPO.   -Increase IV Lopressor to 5 mg IV every 8 hours.   -IV fluids.   -Supportive care.    Chronic diastolic CHF Mild pulmonary hypertension -IV fluids resumed.   -Continue to hold diuretics.   -Supportive care.    History of DVT  Change Eliquis to Lovenox to begin after patient has been seen by GI if no further procedures are needed or postprocedure.    Anxiety, depression -Discontinue Valium and placed on IV Ativan as needed as patient currently NPO.    History of dementia -Delirium improved on delirium precautions.   -Currently at baseline.      DVT prophylaxis: Eliquis Code Status: Full Family Communication: Updated patient.  No  family at bedside. Disposition:   Status is: Inpatient  Remains inpatient appropriate because: Severity of illness.       Consultants:  Orthopedics: Dr.Xu 01/21/2021 Gastroenterology pending  Procedures:  CT left knee 01/20/2021 CT  head CT C-spine 01/20/2021 Abdominal films 01/26/2021 Plain films of the left femur 01/21/2021 Plain films of the left knee 01/20/2021 Plain films of the left hip and pelvis 01/20/2021 Open treatment of intertrochanteric fracture with intramedullary implant per Dr. [name] 01/21/2021 CT abdomen and pelvis 01/28/2021  Antimicrobials:  IV Ancef 01/21/2021>>>> 01/22/2021   Subjective: Patient laying in bed.  Asleep but arousable.  Had some nausea and emesis overnight.  Still with some nausea this morning.  No emesis yet this morning.  No shortness of breath.  Does state had some heaviness in the epigastric midsternal area which has been ongoing however she failed to mention it earlier on.   Objective: Vitals:   01/29/21 0010 01/29/21 0300 01/29/21 0415 01/29/21 0742  BP: (!) 166/82  (!) 160/82 (!) 169/70  Pulse: 98 64 89 83  Resp: 18  18 18   Temp: (!) 97.5 F (36.4 C)  98 F (36.7 C) (!) 97.5 F (36.4 C)  TempSrc:   Oral Oral  SpO2: 98%   96%  Weight:      Height:        Intake/Output Summary (Last 24 hours) at 01/29/2021 0921 Last data filed at 01/28/2021 1500 Gross per 24 hour  Intake 1084.55 ml  Output 300 ml  Net 784.55 ml    Filed Weights   01/23/21 0500 01/27/21 0500 01/28/21 0500  Weight: 77.5 kg 75.7 kg 77.2 kg    Examination:  General exam: Significantly dry mucous membranes. Respiratory system: CTA B anterior lung fields.  No wheezes, no crackles, no rhonchi.  Normal respiratory effort.  Cardiovascular system: Tachycardia.  No JVD.  No murmurs rubs or gallops.  No lower extremity edema.   Gastrointestinal system: Abdomen is soft, nontender, and some diffuse tenderness to palpation.  Positive bowel sounds.  No rebound.  No guarding.  Central nervous system: Alert and oriented.  Moving extremities spontaneously.  No focal neurological deficits.   Extremities: Symmetric 5 x 5 power. Skin: No rashes, lesions or ulcers Psychiatry: Judgement and insight appear normal.  Mood & affect appropriate.     Data Reviewed: I have personally reviewed following labs and imaging studies  CBC: Recent Labs  Lab 01/24/21 0323 01/26/21 0156 01/27/21 0237 01/28/21 0817 01/29/21 0326  WBC 8.6 8.8 9.4 10.5 10.8*  NEUTROABS  --   --   --  7.3 7.2  HGB 9.0* 9.7* 10.7* 10.1* 10.2*  HCT 27.3* 29.9* 33.4* 32.4* 31.8*  MCV 93.8 94.9 94.9 96.7 96.1  PLT 155 232 281 294 300     Basic Metabolic Panel: Recent Labs  Lab 01/25/21 0154 01/26/21 0156 01/27/21 0237 01/28/21 0817 01/29/21 0326  NA 133* 137 137 138 140  K 3.8 4.3 4.7 4.1 4.1  CL 105 108 105 107 111  CO2 21* 22 22 21* 20*  GLUCOSE 161* 145* 173* 163* 146*  BUN 23 21 23  27* 25*  CREATININE 1.56* 1.72* 1.79* 1.67* 1.57*  CALCIUM 9.6 10.1 10.6* 10.3 9.8  MG  --   --  1.9 1.9  --      GFR: Estimated Creatinine Clearance: 23.9 mL/min (A) (by C-G formula based on SCr of 1.57 mg/dL (H)).  Liver Function Tests: No results for input(s): AST, ALT, ALKPHOS, BILITOT, PROT, ALBUMIN in the  last 168 hours.   CBG: Recent Labs  Lab 01/28/21 2003 01/28/21 2349 01/29/21 0006 01/29/21 0344 01/29/21 0738  GLUCAP 149* 147* 143* 143* 140*      Recent Results (from the past 240 hour(s))  Resp Panel by RT-PCR (Flu A&B, Covid) Nasopharyngeal Swab     Status: None   Collection Time: 01/20/21  6:24 PM   Specimen: Nasopharyngeal Swab; Nasopharyngeal(NP) swabs in vial transport medium  Result Value Ref Range Status   SARS Coronavirus 2 by RT PCR NEGATIVE NEGATIVE Final    Comment: (NOTE) SARS-CoV-2 target nucleic acids are NOT DETECTED.  The SARS-CoV-2 RNA is generally detectable in upper respiratory specimens during the acute phase of infection. The lowest concentration of SARS-CoV-2 viral copies this assay can detect is 138 copies/mL. A negative result does not preclude SARS-Cov-2 infection and should not be used as the sole basis for treatment or other patient management decisions. A negative result  may occur with  improper specimen collection/handling, submission of specimen other than nasopharyngeal swab, presence of viral mutation(s) within the areas targeted by this assay, and inadequate number of viral copies(<138 copies/mL). A negative result must be combined with clinical observations, patient history, and epidemiological information. The expected result is Negative.  Fact Sheet for Patients:  EntrepreneurPulse.com.au  Fact Sheet for Healthcare Providers:  IncredibleEmployment.be  This test is no t yet approved or cleared by the [place] FDA and  has been authorized for detection and/or diagnosis of SARS-CoV-2 by FDA under an Emergency Use Authorization (EUA). This EUA will remain  in effect (meaning this test can be used) for the duration of the COVID-19 declaration under Section 564(b)(1) of the Act, 21 U.S.C.section 360bbb-3(b)(1), unless the authorization is terminated  or revoked sooner.       Influenza A by PCR NEGATIVE NEGATIVE Final   Influenza B by PCR NEGATIVE NEGATIVE Final    Comment: (NOTE) The Xpert Xpress SARS-CoV-2/FLU/RSV plus assay is intended as an aid in the diagnosis of influenza from Nasopharyngeal swab specimens and should not be used as a sole basis for treatment. Nasal washings and aspirates are unacceptable for Xpert Xpress SARS-CoV-2/FLU/RSV testing.  Fact Sheet for Patients: EntrepreneurPulse.com.au  Fact Sheet for Healthcare Providers: IncredibleEmployment.be  This test is not yet approved or cleared by the [place] FDA and has been authorized for detection and/or diagnosis of SARS-CoV-2 by FDA under an Emergency Use Authorization (EUA). This EUA will remain in effect (meaning this test can be used) for the duration of the COVID-19 declaration under Section 564(b)(1) of the Act, 21 U.S.C. section 360bbb-3(b)(1), unless the authorization is terminated  or revoked.  Performed at [facility], [place] [address] [place], [place] [address]   Surgical pcr screen     Status: None   Collection Time: 01/21/21  8:36 AM   Specimen: Nasal Mucosa; Nasal Swab  Result Value Ref Range Status   MRSA, PCR NEGATIVE NEGATIVE Final   Staphylococcus aureus NEGATIVE NEGATIVE Final    Comment: (NOTE) The Xpert SA Assay (FDA approved for NASAL specimens in patients 24 years of age and older), is one component of a comprehensive surveillance program. It is not intended to diagnose infection nor to guide or monitor treatment. Performed at [facility], [place] [address], [place], [place] [address]   SARS CORONAVIRUS 2 (TAT 6-24 HRS) Nasopharyngeal Nasopharyngeal Swab     Status: None   Collection Time: 01/25/21  6:35 PM   Specimen: Nasopharyngeal Swab  Result Value  Ref Range Status   SARS Coronavirus 2 NEGATIVE NEGATIVE Final    Comment: (NOTE) SARS-CoV-2 target nucleic acids are NOT DETECTED.  The SARS-CoV-2 RNA is generally detectable in upper and lower respiratory specimens during the acute phase of infection. Negative results do not preclude SARS-CoV-2 infection, do not rule out co-infections with other pathogens, and should not be used as the sole basis for treatment or other patient management decisions. Negative results must be combined with clinical observations, patient history, and epidemiological information. The expected result is Negative.  Fact Sheet for Patients: SugarRoll.be  Fact Sheet for Healthcare Providers: https://www.woods-mathews.com/  This test is not yet approved or cleared by the [place] FDA and  has been authorized for detection and/or diagnosis of SARS-CoV-2 by FDA under an Emergency Use Authorization (EUA). This EUA will remain  in effect (meaning this test can be used) for the duration of the COVID-19 declaration under Se ction 564(b)(1) of the Act, 21  U.S.C. section 360bbb-3(b)(1), unless the authorization is terminated or revoked sooner.  Performed at [facility], [place] [address], [place], [place] [address]           Radiology Studies: CT ABDOMEN PELVIS WO CONTRAST  Result Date: 01/28/2021 CLINICAL DATA:  Nausea/vomiting Abdominal pain, acute, nonlocalized EXAM: CT ABDOMEN AND PELVIS WITHOUT CONTRAST TECHNIQUE: Multidetector CT imaging of the abdomen and pelvis was performed following the standard protocol without IV contrast. COMPARISON:  X-ray intraoperative left femur 01/21/2021 FINDINGS: Lower chest: Bilateral trace pleural effusions. Bilateral lower lobe passive atelectasis. Diffuse bronchial wall thickening. The distal esophageal lumen is dilated with impacted food bolus. Hepatobiliary: No focal liver abnormality. Nonspecific hydropic gallbladder. No gallstones, gallbladder wall thickening, or pericholecystic fluid. No biliary dilatation. Pancreas: Diffusely atrophic. No focal lesion. Otherwise normal pancreatic contour. No surrounding inflammatory changes. No main pancreatic ductal dilatation. Spleen: Punctate calcifications likely represent sequelae of prior granulomatous disease. Normal in size without focal abnormality. Adrenals/Urinary Tract: No adrenal nodule bilaterally. Bilateral perinephric stranding. No nephrolithiasis and no hydronephrosis. There is a 1.3 cm hyperdense left renal lesion with a density of 74 Hounsfield units consistent with a hemorrhagic or proteinaceous cyst. No ureterolithiasis or hydroureter. The urinary bladder is unremarkable. Stomach/Bowel: Findings suggestive fundoplication repair with markedly limited evaluation due to streak artifact from the surgical staples. Otherwise stomach is within normal limits. No evidence of bowel wall thickening or dilatation. Vascular/Lymphatic: No abdominal aorta or iliac aneurysm. Severe atherosclerotic plaque of the aorta and its branches. No abdominal, pelvic, or  inguinal lymphadenopathy. Reproductive: Status post hysterectomy. No adnexal masses. Other: No intraperitoneal free fluid. No intraperitoneal free gas. No organized fluid collection. Musculoskeletal: No abdominal wall hernia or abnormality. Diffusely decreased bone density. L3 through L5 laminectomy and posterolateral fusion. No suspicious lytic or blastic osseous lesions. No acute displaced fracture. Multilevel degenerative changes of the spine. Partially visualized intramedullary nail fixation of the proximal left femur in a patient with a known acute intratrochanteric fracture. IMPRESSION: 1. Dilatation of distal esophageal lumen with impacted food bolus in the setting of prior fundoplication/hiatal hernia repair. 2. Bilateral trace pleural effusions. 3.  Aortic Atherosclerosis (ICD10-I70.0). These results will be called to the ordering clinician or representative by the Radiologist Assistant, and communication documented in the PACS or [employer]. Electronically Signed   By: Iven Finn M.D.   On: 01/28/2021 22:15        Scheduled Meds:  insulin aspart  0-9 Units Subcutaneous Q4H   metoprolol tartrate  2.5 mg Intravenous Q8H  pantoprazole (PROTONIX) IV  40 mg Intravenous Q24H   Continuous Infusions:  sodium chloride 100 mL/hr at 01/29/21 0658   methocarbamol (ROBAXIN) IV       LOS: 9 days    Time spent: 35 minutes    Irine Seal, MD [facility]   To contact the attending provider between 7A-7P or the covering provider during after hours 7P-7A, please log into the web site [email or web address] and access using universal [place] password for that web site. If you do not have the password, please call the hospital operator.  01/29/2021, 9:21 AM

## 2021-01-29 NOTE — Progress Notes (Signed)
ANTICOAGULATION CONSULT NOTE - Initial Consult  Pharmacy Consult for Lovenox Indication:  hx of DVT  Allergies  Allergen Reactions   Aspirin Nausea And Vomiting   Codeine Nausea And Vomiting   Ibuprofen Nausea And Vomiting   Meperidine Hcl    Tape     ADHESIVE TAPE CAUSES BLISTERS   Augmentin [Amoxicillin-Pot Clavulanate]     GI upset    Patient Measurements: Height: 5' 3" (160 cm) Weight: 77.2 kg (170 lb 3.1 oz) IBW/kg (Calculated) : 52.4 Heparin Dosing Weight:   Vital Signs: Temp: 97.5 F (36.4 C) (11/20 0742) Temp Source: Oral (11/20 0742) BP: 169/70 (11/20 0742) Pulse Rate: 83 (11/20 0742)  Labs: Recent Labs    01/27/21 0237 01/28/21 0817 01/29/21 0326  HGB 10.7* 10.1* 10.2*  HCT 33.4* 32.4* 31.8*  PLT 281 294 300  CREATININE 1.79* 1.67* 1.57*    Estimated Creatinine Clearance: 23.9 mL/min (A) (by C-G formula based on SCr of 1.57 mg/dL (H)).   Medical History: Past Medical History:  Diagnosis Date   Allergic rhinitis, cause unspecified    Asthma    Esophageal reflux    Sleep apnea    on C-pap- Dr young follows   Syncope 2019   Normal LVF, negative monitor   Type II or unspecified type diabetes mellitus without mention of complication, not stated as uncontrolled    Unspecified essential hypertension    mild LVH, normal LVF, grade 2 DD by echo 2019    Medications:  Medications Prior to Admission  Medication Sig Dispense Refill Last Dose   acetaminophen (TYLENOL) 500 MG tablet Take 1,000 mg by mouth every 6 (six) hours as needed for moderate pain or headache.   Past Month   albuterol (PROAIR HFA) 108 (90 Base) MCG/ACT inhaler 2 puffs every 4-6 hours as directed- rescue (Patient taking differently: Inhale 2 puffs into the lungs every 6 (six) hours as needed for wheezing or shortness of breath.) 1 Inhaler prn Past Week   albuterol (PROVENTIL) (2.5 MG/3ML) 0.083% nebulizer solution Take 3 mLs (2.5 mg total) by nebulization every 6 (six) hours as needed  for wheezing or shortness of breath. DX J45.998 60 vial 12 unk   BD PEN NEEDLE NANO U/F 32G X 4 MM MISC USE TO INJECT INSULIN ONCE D      Blood Glucose Monitoring Suppl (ONE TOUCH ULTRA SYSTEM KIT) W/DEVICE KIT 1 kit by Does not apply route once.      chlorpheniramine-HYDROcodone (TUSSIONEX PENNKINETIC ER) 10-8 MG/5ML SUER Take 5 mLs by mouth every 12 (twelve) hours as needed for cough. 140 mL 0 Past Week   cloNIDine (CATAPRES) 0.1 MG tablet Take 0.1 mg by mouth 2 (two) times daily.   01/19/2021   ELIQUIS 2.5 MG TABS tablet Take 2.5 mg by mouth 2 (two) times daily.   01/19/2021 at 1930   furosemide (LASIX) 40 MG tablet Take 40 mg by mouth daily as needed for fluid or edema.   Past Month   Lancets MISC by Does not apply route daily.      loratadine (CLARITIN) 10 MG tablet Take 10 mg by mouth daily as needed for allergies.   Past Week   metoprolol (LOPRESSOR) 50 MG tablet Take 25 mg by mouth 2 (two) times daily.   01/19/2021 at 1930   omeprazole (PRILOSEC) 20 MG capsule Take 20 mg by mouth every evening.   01/19/2021   ONE TOUCH ULTRA TEST test strip Checks blood sugar 3 times daily  8  simvastatin (ZOCOR) 40 MG tablet Take 40 mg by mouth every evening.   01/19/2021   TOUJEO SOLOSTAR 300 UNIT/ML SOPN Inject 10 Units into the skin at bedtime.   01/19/2021   traMADol (ULTRAM) 50 MG tablet Take 1 tablet (50 mg total) by mouth every 6 (six) hours as needed. (Patient taking differently: Take 50 mg by mouth every 6 (six) hours as needed for moderate pain.) 10 tablet 0 unk   [DISCONTINUED] diazepam (VALIUM) 5 MG tablet Take 5 mg by mouth every 12 (twelve) hours as needed for anxiety.   unk   [DISCONTINUED] hydrALAZINE (APRESOLINE) 50 MG tablet Take 50 mg by mouth 2 (two) times daily.   01/19/2021   [DISCONTINUED] telmisartan (MICARDIS) 80 MG tablet Take 80 mg by mouth daily.   01/19/2021   Scheduled:   insulin aspart  0-9 Units Subcutaneous Q4H   metoprolol tartrate  2.5 mg Intravenous Q8H    pantoprazole (PROTONIX) IV  40 mg Intravenous Q24H    Assessment: Pt has been on Apixaban for her hx of DVT. Apixaban was held for her IM nail after a fall and then subsequently resumed. CT on 11/19 showed dilatation of esophageal with food impaction leading to intractable N/V. All PO meds have been put on hold. Plan to bridge with Lovenox if clear with GI  Goal of Therapy:  Anti-Xa level 0.6-1 units/ml 4hrs after LMWH dose given Monitor platelets by anticoagulation protocol: Yes   Plan:  F/u with GI consult   Onnie Boer, PharmD, BCIDP, AAHIVP, CPP Infectious Disease Pharmacist 01/29/2021 9:10 AM

## 2021-01-29 NOTE — Anesthesia Preprocedure Evaluation (Addendum)
Anesthesia Evaluation  Patient identified by MRN, date of birth, ID band  Reviewed: Allergy & Precautions, NPO status , Patient's Chart, lab work & pertinent test results  Airway Mallampati: II  TM Distance: >3 FB Neck ROM: Full    Dental  (+) Lower Dentures, Upper Dentures   Pulmonary asthma , sleep apnea and Continuous Positive Airway Pressure Ventilation ,    Pulmonary exam normal breath sounds clear to auscultation       Cardiovascular hypertension, Pt. on medications and Pt. on home beta blockers Normal cardiovascular exam Rhythm:Regular Rate:Normal  ECG: SR, rate 73   Neuro/Psych PSYCHIATRIC DISORDERS Dementia negative neurological ROS     GI/Hepatic Neg liver ROS, GERD  Medicated and Controlled,  Endo/Other  diabetes  Renal/GU Renal InsufficiencyRenal disease     Musculoskeletal negative musculoskeletal ROS (+)   Abdominal   Peds  Hematology  (+) anemia , HLD   Anesthesia Other Findings Food bolus impaction on CT Last dose of Eliquis last night  Reproductive/Obstetrics                            Anesthesia Physical Anesthesia Plan  ASA: 3  Anesthesia Plan: MAC   Post-op Pain Management:    Induction: Intravenous  PONV Risk Score and Plan: 2 and Propofol infusion and Treatment may vary due to age or medical condition  Airway Management Planned: Nasal Cannula  Additional Equipment:   Intra-op Plan:   Post-operative Plan:   Informed Consent: I have reviewed the patients History and Physical, chart, labs and discussed the procedure including the risks, benefits and alternatives for the proposed anesthesia with the patient or authorized representative who has indicated his/her understanding and acceptance.       Plan Discussed with: CRNA  Anesthesia Plan Comments:        Anesthesia Quick Evaluation

## 2021-01-29 NOTE — Anesthesia Postprocedure Evaluation (Signed)
Anesthesia Post Note  Patient: [name] [name]  Procedure(s) Performed: ESOPHAGOGASTRODUODENOSCOPY (EGD) WITH PROPOFOL FOREIGN BODY REMOVAL     Patient location during evaluation: PACU Anesthesia Type: General Level of consciousness: awake Pain management: pain level controlled Vital Signs Assessment: post-procedure vital signs reviewed and stable Respiratory status: spontaneous breathing, nonlabored ventilation, respiratory function stable and patient connected to nasal cannula oxygen Cardiovascular status: blood pressure returned to baseline and stable Postop Assessment: no apparent nausea or vomiting Anesthetic complications: no   No notable events documented.  Last Vitals:  Vitals:   01/29/21 1605 01/29/21 1631  BP: 119/80 (!) 147/73  Pulse: 92 90  Resp: 18 20  Temp: 36.6 C 37.1 C  SpO2: 96% 97%    Last Pain:  Vitals:   01/29/21 1631  TempSrc: Oral  PainSc:                  Karyl Kinnier Collin Rengel

## 2021-01-30 DIAGNOSIS — T18128D Food in esophagus causing other injury, subsequent encounter: Secondary | ICD-10-CM

## 2021-01-30 LAB — CBC WITH DIFFERENTIAL/PLATELET
Abs Immature Granulocytes: 0 10*3/uL (ref 0.00–0.07)
Basophils Absolute: 0.1 10*3/uL (ref 0.0–0.1)
Basophils Relative: 1 %
Eosinophils Absolute: 0.2 10*3/uL (ref 0.0–0.5)
Eosinophils Relative: 2 %
HCT: 29.9 % — ABNORMAL LOW (ref 36.0–46.0)
Hemoglobin: 9.3 g/dL — ABNORMAL LOW (ref 12.0–15.0)
Lymphocytes Relative: 20 %
Lymphs Abs: 2.2 10*3/uL (ref 0.7–4.0)
MCH: 30.2 pg (ref 26.0–34.0)
MCHC: 31.1 g/dL (ref 30.0–36.0)
MCV: 97.1 fL (ref 80.0–100.0)
Monocytes Absolute: 1.2 10*3/uL — ABNORMAL HIGH (ref 0.1–1.0)
Monocytes Relative: 11 %
Neutro Abs: 7.2 10*3/uL (ref 1.7–7.7)
Neutrophils Relative %: 66 %
Platelets: 272 10*3/uL (ref 150–400)
RBC: 3.08 MIL/uL — ABNORMAL LOW (ref 3.87–5.11)
RDW: 14.2 % (ref 11.5–15.5)
WBC: 10.9 10*3/uL — ABNORMAL HIGH (ref 4.0–10.5)
nRBC: 0 % (ref 0.0–0.2)

## 2021-01-30 LAB — BASIC METABOLIC PANEL
Anion gap: 4 — ABNORMAL LOW (ref 5–15)
BUN: 27 mg/dL — ABNORMAL HIGH (ref 8–23)
CO2: 22 mmol/L (ref 22–32)
Calcium: 9.5 mg/dL (ref 8.9–10.3)
Chloride: 113 mmol/L — ABNORMAL HIGH (ref 98–111)
Creatinine, Ser: 1.43 mg/dL — ABNORMAL HIGH (ref 0.44–1.00)
GFR, Estimated: 35 mL/min — ABNORMAL LOW (ref >60)
Glucose, Bld: 154 mg/dL — ABNORMAL HIGH (ref 70–99)
Potassium: 3.7 mmol/L (ref 3.5–5.1)
Sodium: 139 mmol/L (ref 135–145)

## 2021-01-30 LAB — MAGNESIUM: Magnesium: 1.7 mg/dL (ref 1.7–2.4)

## 2021-01-30 LAB — GLUCOSE, CAPILLARY
Glucose-Capillary: 116 mg/dL — ABNORMAL HIGH (ref 70–99)
Glucose-Capillary: 130 mg/dL — ABNORMAL HIGH (ref 70–99)
Glucose-Capillary: 145 mg/dL — ABNORMAL HIGH (ref 70–99)
Glucose-Capillary: 162 mg/dL — ABNORMAL HIGH (ref 70–99)
Glucose-Capillary: 165 mg/dL — ABNORMAL HIGH (ref 70–99)
Glucose-Capillary: 201 mg/dL — ABNORMAL HIGH (ref 70–99)

## 2021-01-30 NOTE — Progress Notes (Signed)
[facility] Gastroenterology Progress Note  BETHANNE MULE 87 y.o. 1929-04-02  CC: Food impaction   Subjective: Patient seen and examined at bedside.  Doing better.  No complication from EGD yesterday.  Denies abdominal pain, blood in the stool or black stool.  ROS : Febrile, negative for nausea and vomiting   Objective: Vital signs in last 24 hours: Vitals:   01/30/21 0410 01/30/21 0758  BP: (!) 167/69 (!) 177/76  Pulse: 83 80  Resp: 16   Temp: 98.1 F (36.7 C) (!) 97.3 F (36.3 C)  SpO2: 95% 98%    Physical Exam:  General : Elderly patient, not in acute distress Abdomen : Soft, nontender, nondistended, bowel sounds present.  No peritoneal signs Psych : Mood and affect normal Neuro : Alert   Lab Results: Recent Labs    01/28/21 0817 01/29/21 0326 01/30/21 0311  NA 138 140 139  K 4.1 4.1 3.7  CL 107 111 113*  CO2 21* 20* 22  GLUCOSE 163* 146* 154*  BUN 27* 25* 27*  CREATININE 1.67* 1.57* 1.43*  CALCIUM 10.3 9.8 9.5  MG 1.9  --  1.7   No results for input(s): AST, ALT, ALKPHOS, BILITOT, PROT, ALBUMIN in the last 72 hours. Recent Labs    01/29/21 0326 01/30/21 0311  WBC 10.8* 10.9*  NEUTROABS 7.2 7.2  HGB 10.2* 9.3*  HCT 31.8* 29.9*  MCV 96.1 97.1  PLT 300 272   No results for input(s): LABPROT, INR in the last 72 hours.    Assessment/Plan: -Food impaction.  S/p EGD with food bolus removal yesterday. -Esophagitis -Anticoagulation use with Eliquis.  Recommendation --------------------- -Okay to resume anticoagulation from GI standpoint -Advance diet to full liquid.  Okay to start soft diet if tolerating full liquid.  Recommend patient chew food well and moist food before swallowing. -Recommend Protonix 40 mg twice daily for 4 weeks followed by Protonix 40 mg once a day for another 4 weeks. -Follow-up with Dr. Therisa Doyne as an outpatient in next 2 to 3 months to discuss EGD with Botox injection -GI will sign off.  Call us back if needed   Otis Brace MD, [place] 01/30/2021, 9:01 AM  Contact #  [phone]

## 2021-01-30 NOTE — Progress Notes (Signed)
Physical Therapy Treatment Patient Details Name: [name] [name] MRN: [id number] DOB: 1929-10-02 Today's Date: 01/30/2021   History of Present Illness [name] [name] is a 87 y.o. female who presented to the ED after a fall resulting in a L intertrochanteric hip fx; s/p IM Nail, WBAT; with medical history significant of Alzheimer's dz, DM2, HTN, CKD 4, chronic anticoagulation on eliquis (?prior DVT?).    PT Comments    Patient not able to progress with mobility today secondary to positive orthostatics. Requires Mod A to stand from EOB and able to side step along side bed with Min A and use of RW for support however the longer pt is standing, the worse her dizziness becomes. See vitals below. Supine BP 137/79, HR 135 bpm Sitting BP 91/49 HR 136 bpm Standing BP 68/48 HR 156 bpm, symptomatic and dizzy.  Adjusted ted hose as they were at her ankles. Would consider trying ace wraps next session to see if it helps stabilize BP with activity. Pt very symptomatic with standing, resolves after she sits for a few minutes. Tolerated there ex. Also of note, HR up to 156 bpm with standing. Will continue to follow, however progression is limited due to BP.    Recommendations for follow up therapy are one component of a multi-disciplinary discharge planning process, led by the attending physician.  Recommendations may be updated based on patient status, additional functional criteria and insurance authorization.  Follow Up Recommendations  Skilled nursing-short term rehab (<3 hours/day)     Assistance Recommended at Discharge Frequent or constant Supervision/Assistance  Equipment Recommendations  Rolling walker (2 wheels);BSC/3in1    Recommendations for Other Services       Precautions / Restrictions Precautions Precautions: Fall;Other (comment) Precaution Comments: orthostatic (symptomatic) Restrictions Weight Bearing Restrictions: Yes LLE Weight Bearing: Weight bearing as tolerated      Mobility  Bed Mobility Overal bed mobility: Needs Assistance Bed Mobility: Sit to Supine       Sit to supine: Mod assist;HOB elevated   General bed mobility comments: Assist to bring LEs into bed to return to supine. Half way sitting on EOB upon arrival, assisted wtih scooting bottom to EOB and to level up hips with mod A.    Transfers Overall transfer level: Needs assistance Equipment used: Rolling walker (2 wheels) Transfers: Sit to/from Stand Sit to Stand: Mod assist;From elevated surface           General transfer comment: Assist to power to stand from EOB with cues for hand placement and techniqe. Stood from EOB x2, dizziness worsening with standing duration. Deferred chair transfer due to symptomatic orthostatic hypotension. HR up to 156 bpm.    Ambulation/Gait Ambulation/Gait assistance: Min assist Gait Distance (Feet): 6 Feet Assistive device: Rolling walker (2 wheels)       Pre-gait activities: Able to side step along side bed with Min A for RW management, narrow BoS, + dizziness limiting walking away from bed and elevated HR.     Stairs             Wheelchair Mobility    Modified Rankin (Stroke Patients Only)       Balance Overall balance assessment: Needs assistance Sitting-balance support: No upper extremity supported;Feet supported Sitting balance-Leahy Scale: Fair Sitting balance - Comments: able to maintain static sitting balance without assist   Standing balance support: During functional activity;Reliant on assistive device for balance Standing balance-Leahy Scale: Poor Standing balance comment: reliant on external support and close Minguard due to dizziness.  Cognition Arousal/Alertness: Awake/alert Behavior During Therapy: WFL for tasks assessed/performed Overall Cognitive Status: No family/caregiver present to determine baseline cognitive functioning                                  General Comments: appropriate for basic mobility tasks, asking to do whatever she has to do to get out of here        Exercises Total Joint Exercises Hip ABduction/ADduction: AROM;Both;10 reps;Seated General Exercises - Lower Extremity Heel Raises: Strengthening;Both;10 reps;Seated    General Comments General comments (skin integrity, edema, etc.): Positive orthostatics. Supine BP 137/79, HR 135 bpm, Sitting BP 91/49 HR 136 bpm,. standing BP 68/48 HR 156 bpm, symptomatic and dizzy.      Pertinent Vitals/Pain Pain Assessment: Faces Faces Pain Scale: Hurts little more Pain Location: Lft hip with mobility Pain Descriptors / Indicators: Grimacing;Guarding;Discomfort Pain Intervention(s): Monitored during session;Limited activity within patient's tolerance;Repositioned    Home Living                          Prior Function            PT Goals (current goals can now be found in the care plan section) Progress towards PT goals: Not progressing toward goals - comment (BP/HR)    Frequency    Min 3X/week      PT Plan Current plan remains appropriate    Co-evaluation              AM-PAC PT "6 Clicks" Mobility   Outcome Measure  Help needed turning from your back to your side while in a flat bed without using bedrails?: A Lot Help needed moving from lying on your back to sitting on the side of a flat bed without using bedrails?: A Lot Help needed moving to and from a bed to a chair (including a wheelchair)?: A Lot Help needed standing up from a chair using your arms (e.g., wheelchair or bedside chair)?: A Lot Help needed to walk in hospital room?: A Little Help needed climbing 3-5 steps with a railing? : Total 6 Click Score: 12    End of Session Equipment Utilized During Treatment: Gait belt Activity Tolerance: Treatment limited secondary to medical complications (Comment) (+ orthostatics) Patient left: in bed;with call bell/phone within reach;with  bed alarm set Nurse Communication: Mobility status;Other (comment) (orthostatics/HR) PT Visit Diagnosis: Unsteadiness on feet (R26.81);Other abnormalities of gait and mobility (R26.89);Pain Pain - Right/Left: Left Pain - part of body: Hip     Time: 7741-2878 PT Time Calculation (min) (ACUTE ONLY): 20 min  Charges:  $Therapeutic Activity: 8-22 mins                     Marisa Severin, PT, DPT Acute Rehabilitation Services Pager [phone] Office [phone]      [name] [name] 01/30/2021, 12:09 PM

## 2021-01-30 NOTE — Progress Notes (Signed)
Patient declined CPAP use at this time. Aware to call for Respiratory if patient would like to use CPAP.

## 2021-01-30 NOTE — Progress Notes (Signed)
   01/30/21 1412  Mobility  Activity Contraindicated/medical hold (+ orthostatic)

## 2021-01-30 NOTE — Progress Notes (Signed)
PROGRESS NOTE    [name] [name]  HUD:[id number] DOB: [birth year] DOA: 01/20/2021 PCP: Ginger Organ., MD    Brief Narrative:  87 years old female with past medical history of dementia, CKD stage IV, diabetes mellitus, hypertension, chronic diastolic heart failure, left lower extremity DVT on anticoagulation presented to hospital after sustaining a mechanical fall.  In the ED, patient was noted to have left hip fracture with elevated creatinine at 3.2 from baseline of 2.1.  Patient was then admitted to the hospital for further evaluation and treatment.  Assessment & Plan:   Principal Problem:   Closed left hip fracture, initial encounter ([place]) Active Problems:   DM2 (diabetes mellitus, type 2) ([place])   Essential hypertension   CKD (chronic kidney disease) stage 4, GFR 15-29 ml/min ([facility])   Alzheimer's dementia ([place])   Food impaction of esophagus  Closed left hip fracture, initial encounter Orthopedics was consulted and patient underwent IM nailing on 01/21/2021 by Dr. [name].  Patient was seen by physical therapy who recommended skilled nursing facility placement for short-term rehab.  Continue Eliquis.  Food impaction, possibility of esophageal motility disorder Patient has been having nausea vomiting poor oral intake.  CT scan of the abdomen and pelvis showed distal esophageal lumen impacted bolus.  GI was consulted and underwent removal of food bolus.  Currently patient has been advanced from clears to full liquids.  Plan is to continue soft diet as tolerated.  Patient might need outpatient follow-up with GI for possible evaluation of achalasia with treatment in the outpatient setting.  GI has recommended Protonix 40 twice a day for 4 weeks followed by once a day.  Patient will have to follow-up with Dr. Therisa Doyne as outpatient in 2 to 3 months to discuss EGD with Botox.  Acute kidney injury on CKD4 Baseline creatinine of 2.1.  Creatinine was 3.3 on admission.  Continue to hold ARB and  Lasix.  Patient continues to have orthostatic hypotension.  We will continue to hold diuretics.  We will continue with IV fluids.  Orthostatic hypotension Patient still orthostatic with symptoms this morning.  Orthostatic precautions were explained.  Continue IV fluids.  Hold antihypertensives.  Apply elastic compression stockings.  Check orthostatic blood pressure in AM and every shift.     Type 2 diabetes mellitus Recent hemoglobin A1c was 7.9 on 01/20/2021.  Continue to hold oral hypoglycemic agents.  Continue sliding scale insulin.  Essential hypertension On metoprolol at this time.  Lasix and ARB on hold including hydralazine due to orthostatic hypotension.   Chronic diastolic CHF/Mild pulmonary hypertension Diuretics on hold.  Continue IV fluids.   History of DVT  On Eliquis.    Anxiety, depression  Was on Valium as outpatient.  Currently on Ativan.   History of dementia Currently at baseline.  DVT prophylaxis: Eliquis  Code Status: Full code.  Family Communication:  I spoke with the patient's daughter Ms. [name] on the phone and updated her about the clinical condition of the patient.  Disposition: Nursing facility when medically stable.  And is a still not medically stable.  Status is: Inpatient  Remains inpatient appropriate because: Severity of illness, orthostatic hypotension,  advanced to full liquid..    Consultants:  Orthopedics: Dr.Xu 01/21/2021 Gastroenterology pending  Procedures:  CT left knee 01/20/2021 CT head CT C-spine 01/20/2021 Abdominal films 01/26/2021 Plain films of the left femur 01/21/2021 Plain films of the left knee 01/20/2021 Plain films of the left hip and pelvis 01/20/2021 Open treatment of intertrochanteric fracture  with intramedullary implant per Dr. [name] 01/21/2021 CT abdomen and pelvis 01/28/2021  Antimicrobials:  IV Ancef 01/21/2021>>>> 01/22/2021  Subjective: Today, patient was seen and examined at bedside.patient is on clear  liquids.  Patient was noted to be very orthostatic with symptomatic dizziness on ambulation.  Has not had a bowel movement.    Objective: Vitals:   01/30/21 0013 01/30/21 0410 01/30/21 0500 01/30/21 0758  BP: (!) 151/78 (!) 167/69  (!) 177/76  Pulse: (!) 128 83  80  Resp: 16 16    Temp: 98.5 F (36.9 C) 98.1 F (36.7 C)  (!) 97.3 F (36.3 C)  TempSrc:    Oral  SpO2: 98% 95%  98%  Weight:   77.5 kg   Height:        Intake/Output Summary (Last 24 hours) at 01/30/2021 1624 Last data filed at 01/30/2021 1500 Gross per 24 hour  Intake 3687.08 ml  Output 400 ml  Net 3287.08 ml    Filed Weights   01/28/21 0500 01/29/21 1341 01/30/21 0500  Weight: 77.2 kg 77.2 kg 77.5 kg    Physical examination: General:  Average built, not in obvious distress HENT:   No scleral pallor or icterus noted. Oral mucosa is dry. Chest:  Clear breath sounds.  Diminished breath sounds bilaterally. No crackles or wheezes.  CVS: S1 &S2 heard. No murmur.  Regular rate and rhythm.  Tachycardia. Abdomen: Soft, nontender, nondistended.  Bowel sounds are heard.   Extremities: No cyanosis, clubbing or edema.  Peripheral pulses are palpable. Psych: Alert, awake and oriented, normal mood CNS:  No cranial nerve deficits.  Power equal in all extremities.   Skin: Warm and dry.  No rashes noted.  Data Reviewed: I have personally reviewed the following labs and imaging studies.    CBC: Recent Labs  Lab 01/26/21 0156 01/27/21 0237 01/28/21 0817 01/29/21 0326 01/30/21 0311  WBC 8.8 9.4 10.5 10.8* 10.9*  NEUTROABS  --   --  7.3 7.2 7.2  HGB 9.7* 10.7* 10.1* 10.2* 9.3*  HCT 29.9* 33.4* 32.4* 31.8* 29.9*  MCV 94.9 94.9 96.7 96.1 97.1  PLT 232 281 294 300 854    Basic Metabolic Panel: Recent Labs  Lab 01/26/21 0156 01/27/21 0237 01/28/21 0817 01/29/21 0326 01/30/21 0311  NA 137 137 138 140 139  K 4.3 4.7 4.1 4.1 3.7  CL 108 105 107 111 113*  CO2 22 22 21* 20* 22  GLUCOSE 145* 173* 163* 146* 154*   BUN 21 23 27* 25* 27*  CREATININE 1.72* 1.79* 1.67* 1.57* 1.43*  CALCIUM 10.1 10.6* 10.3 9.8 9.5  MG  --  1.9 1.9  --  1.7     GFR: Estimated Creatinine Clearance: 26.3 mL/min (A) (by C-G formula based on SCr of 1.43 mg/dL (H)).  Liver Function Tests: No results for input(s): AST, ALT, ALKPHOS, BILITOT, PROT, ALBUMIN in the last 168 hours.   CBG: Recent Labs  Lab 01/29/21 2005 01/30/21 0011 01/30/21 0405 01/30/21 0801 01/30/21 1159  GLUCAP 281* 201* 145* 130* 165*     Recent Results (from the past 240 hour(s))  Resp Panel by RT-PCR (Flu A&B, Covid) Nasopharyngeal Swab     Status: None   Collection Time: 01/20/21  6:24 PM   Specimen: Nasopharyngeal Swab; Nasopharyngeal(NP) swabs in vial transport medium  Result Value Ref Range Status   SARS Coronavirus 2 by RT PCR NEGATIVE NEGATIVE Final    Comment: (NOTE) SARS-CoV-2 target nucleic acids are NOT DETECTED.  The SARS-CoV-2 RNA is  generally detectable in upper respiratory specimens during the acute phase of infection. The lowest concentration of SARS-CoV-2 viral copies this assay can detect is 138 copies/mL. A negative result does not preclude SARS-Cov-2 infection and should not be used as the sole basis for treatment or other patient management decisions. A negative result may occur with  improper specimen collection/handling, submission of specimen other than nasopharyngeal swab, presence of viral mutation(s) within the areas targeted by this assay, and inadequate number of viral copies(<138 copies/mL). A negative result must be combined with clinical observations, patient history, and epidemiological information. The expected result is Negative.  Fact Sheet for Patients:  EntrepreneurPulse.com.au  Fact Sheet for Healthcare Providers:  IncredibleEmployment.be  This test is no t yet approved or cleared by the [place] FDA and  has been authorized for detection and/or  diagnosis of SARS-CoV-2 by FDA under an Emergency Use Authorization (EUA). This EUA will remain  in effect (meaning this test can be used) for the duration of the COVID-19 declaration under Section 564(b)(1) of the Act, 21 U.S.C.section 360bbb-3(b)(1), unless the authorization is terminated  or revoked sooner.       Influenza A by PCR NEGATIVE NEGATIVE Final   Influenza B by PCR NEGATIVE NEGATIVE Final    Comment: (NOTE) The Xpert Xpress SARS-CoV-2/FLU/RSV plus assay is intended as an aid in the diagnosis of influenza from Nasopharyngeal swab specimens and should not be used as a sole basis for treatment. Nasal washings and aspirates are unacceptable for Xpert Xpress SARS-CoV-2/FLU/RSV testing.  Fact Sheet for Patients: EntrepreneurPulse.com.au  Fact Sheet for Healthcare Providers: IncredibleEmployment.be  This test is not yet approved or cleared by the [place] FDA and has been authorized for detection and/or diagnosis of SARS-CoV-2 by FDA under an Emergency Use Authorization (EUA). This EUA will remain in effect (meaning this test can be used) for the duration of the COVID-19 declaration under Section 564(b)(1) of the Act, 21 U.S.C. section 360bbb-3(b)(1), unless the authorization is terminated or revoked.  Performed at [facility], [place] [address] [place], [place] [address]   Surgical pcr screen     Status: None   Collection Time: 01/21/21  8:36 AM   Specimen: Nasal Mucosa; Nasal Swab  Result Value Ref Range Status   MRSA, PCR NEGATIVE NEGATIVE Final   Staphylococcus aureus NEGATIVE NEGATIVE Final    Comment: (NOTE) The Xpert SA Assay (FDA approved for NASAL specimens in patients 40 years of age and older), is one component of a comprehensive surveillance program. It is not intended to diagnose infection nor to guide or monitor treatment. Performed at [facility], [place] [address], [place], [place] [address]    SARS CORONAVIRUS 2 (TAT 6-24 HRS) Nasopharyngeal Nasopharyngeal Swab     Status: None   Collection Time: 01/25/21  6:35 PM   Specimen: Nasopharyngeal Swab  Result Value Ref Range Status   SARS Coronavirus 2 NEGATIVE NEGATIVE Final    Comment: (NOTE) SARS-CoV-2 target nucleic acids are NOT DETECTED.  The SARS-CoV-2 RNA is generally detectable in upper and lower respiratory specimens during the acute phase of infection. Negative results do not preclude SARS-CoV-2 infection, do not rule out co-infections with other pathogens, and should not be used as the sole basis for treatment or other patient management decisions. Negative results must be combined with clinical observations, patient history, and epidemiological information. The expected result is Negative.  Fact Sheet for Patients: SugarRoll.be  Fact Sheet for Healthcare Providers: https://www.woods-mathews.com/  This test is  not yet approved or cleared by the [place] and  has been authorized for detection and/or diagnosis of SARS-CoV-2 by FDA under an Emergency Use Authorization (EUA). This EUA will remain  in effect (meaning this test can be used) for the duration of the COVID-19 declaration under Se ction 564(b)(1) of the Act, 21 U.S.C. section 360bbb-3(b)(1), unless the authorization is terminated or revoked sooner.  Performed at [facility], [place] [address] [place], [place] [address]       Radiology Studies: CT ABDOMEN PELVIS WO CONTRAST  Result Date: 01/28/2021 CLINICAL DATA:  Nausea/vomiting Abdominal pain, acute, nonlocalized EXAM: CT ABDOMEN AND PELVIS WITHOUT CONTRAST TECHNIQUE: Multidetector CT imaging of the abdomen and pelvis was performed following the standard protocol without IV contrast. COMPARISON:  X-ray intraoperative left femur 01/21/2021 FINDINGS: Lower chest: Bilateral trace pleural effusions. Bilateral lower lobe passive atelectasis. Diffuse  bronchial wall thickening. The distal esophageal lumen is dilated with impacted food bolus. Hepatobiliary: No focal liver abnormality. Nonspecific hydropic gallbladder. No gallstones, gallbladder wall thickening, or pericholecystic fluid. No biliary dilatation. Pancreas: Diffusely atrophic. No focal lesion. Otherwise normal pancreatic contour. No surrounding inflammatory changes. No main pancreatic ductal dilatation. Spleen: Punctate calcifications likely represent sequelae of prior granulomatous disease. Normal in size without focal abnormality. Adrenals/Urinary Tract: No adrenal nodule bilaterally. Bilateral perinephric stranding. No nephrolithiasis and no hydronephrosis. There is a 1.3 cm hyperdense left renal lesion with a density of 74 Hounsfield units consistent with a hemorrhagic or proteinaceous cyst. No ureterolithiasis or hydroureter. The urinary bladder is unremarkable. Stomach/Bowel: Findings suggestive fundoplication repair with markedly limited evaluation due to streak artifact from the surgical staples. Otherwise stomach is within normal limits. No evidence of bowel wall thickening or dilatation. Vascular/Lymphatic: No abdominal aorta or iliac aneurysm. Severe atherosclerotic plaque of the aorta and its branches. No abdominal, pelvic, or inguinal lymphadenopathy. Reproductive: Status post hysterectomy. No adnexal masses. Other: No intraperitoneal free fluid. No intraperitoneal free gas. No organized fluid collection. Musculoskeletal: No abdominal wall hernia or abnormality. Diffusely decreased bone density. L3 through L5 laminectomy and posterolateral fusion. No suspicious lytic or blastic osseous lesions. No acute displaced fracture. Multilevel degenerative changes of the spine. Partially visualized intramedullary nail fixation of the proximal left femur in a patient with a known acute intratrochanteric fracture. IMPRESSION: 1. Dilatation of distal esophageal lumen with impacted food bolus in the  setting of prior fundoplication/hiatal hernia repair. 2. Bilateral trace pleural effusions. 3.  Aortic Atherosclerosis (ICD10-I70.0). These results will be called to the ordering clinician or representative by the Radiologist Assistant, and communication documented in the PACS or [employer]. Electronically Signed   By: Iven Finn M.D.   On: 01/28/2021 22:15     Scheduled Meds:  apixaban  2.5 mg Oral BID   insulin aspart  0-9 Units Subcutaneous Q4H   metoprolol tartrate  5 mg Intravenous Q8H   pantoprazole (PROTONIX) IV  40 mg Intravenous Q24H   Continuous Infusions:  sodium chloride 125 mL/hr at 01/29/21 1032   sodium chloride 20 mL/hr at 01/29/21 2053   methocarbamol (ROBAXIN) IV       LOS: 10 days    Flora Lipps, MD [facility] 01/30/2021, 4:24 PM

## 2021-01-31 ENCOUNTER — Encounter (HOSPITAL_COMMUNITY): Payer: Self-pay | Admitting: Gastroenterology

## 2021-01-31 LAB — GLUCOSE, CAPILLARY
Glucose-Capillary: 103 mg/dL — ABNORMAL HIGH (ref 70–99)
Glucose-Capillary: 116 mg/dL — ABNORMAL HIGH (ref 70–99)
Glucose-Capillary: 120 mg/dL — ABNORMAL HIGH (ref 70–99)
Glucose-Capillary: 122 mg/dL — ABNORMAL HIGH (ref 70–99)
Glucose-Capillary: 141 mg/dL — ABNORMAL HIGH (ref 70–99)
Glucose-Capillary: 143 mg/dL — ABNORMAL HIGH (ref 70–99)
Glucose-Capillary: 148 mg/dL — ABNORMAL HIGH (ref 70–99)
Glucose-Capillary: 163 mg/dL — ABNORMAL HIGH (ref 70–99)

## 2021-01-31 MED ORDER — METOPROLOL SUCCINATE ER 25 MG PO TB24
12.5000 mg | ORAL_TABLET | Freq: Every day | ORAL | Status: DC
  Administered 2021-01-31 – 2021-02-01 (×2): 12.5 mg via ORAL
  Filled 2021-01-31 (×2): qty 1

## 2021-01-31 NOTE — Progress Notes (Signed)
Patient is refusing CPAP for tonight.  No distress noted at this time.

## 2021-01-31 NOTE — TOC Progression Note (Signed)
Transition of Care [facility]) - Progression Note    Patient Details  Name: [name] [name] MRN: [id number] Date of Birth: January 10, 1930  [facility]) CM/SW Contact  Joanne Chars, LCSW Phone Number: 01/31/2021, 12:43 PM  Clinical Narrative:   Lorie Apley received from [facility]: ref# [id number],  7days, 11/22-11/28. CSW spoke with [place] at [facility] and she can accept pt today. Need rapid covid test, MD and RN informed.          Expected Discharge Plan and Services                                                 Social Determinants of Health (SDOH) Interventions    Readmission Risk Interventions No flowsheet data found.

## 2021-01-31 NOTE — Progress Notes (Addendum)
OT Cancellation Note  Patient Details Name: [name] [name] MRN: [id number] DOB: May 16, 1929   Cancelled Treatment:    Reason Eval/Treat Not Completed: Patient declined, no reason specified, worked with PT this morning and has been up in recliner, pt just got back to bed from chair with NT, extremely fatigued, asking to rest at this time. Will follow up with pt later in the day as schedule allows.   Lynnda Child, OTD, OTR/L Acute Rehab [phone]   Kaylyn Lim 01/31/2021, 1:07 PM

## 2021-01-31 NOTE — Progress Notes (Signed)
Occupational Therapy Treatment Patient Details Name: [name] [name] MRN: [id number] DOB: 06/26/1929 Today's Date: 01/31/2021   History of present illness [name] [name] is a 87 y.o. female who presented to the ED after a fall resulting in a L intertrochanteric hip fx; s/p IM Nail, WBAT; with medical history significant of Alzheimer's dz, DM2, HTN, CKD 4, chronic anticoagulation on eliquis (?prior DVT?).   OT comments  Pt in bed upon arrival, declines sitting EOB due to just eating and fearful that she will have nausea/vomiting. Educated pt on UE exercises to complete while in bed to maintain strength, pt verbalizes and demonstrates understanding. Notes mild pain in L side of neck and LUE, RN notified. Pt limited by pain, decreased balance, activity tolerance, and strength at this time, will continue to follow acutely to maximize safety and independence with ADLs and functional mobility. Continue to recommend SNF at d/c.   Recommendations for follow up therapy are one component of a multi-disciplinary discharge planning process, led by the attending physician.  Recommendations may be updated based on patient status, additional functional criteria and insurance authorization.    Follow Up Recommendations  Skilled nursing-short term rehab (<3 hours/day)    Assistance Recommended at Discharge Frequent or constant Supervision/Assistance  Equipment Recommendations  Other (comment) (defer to next venue of care)    Recommendations for Other Services      Precautions / Restrictions Precautions Precautions: Fall Precaution Comments: orthostatic (symptomatic) Restrictions Weight Bearing Restrictions: Yes LLE Weight Bearing: Weight bearing as tolerated Other Position/Activity Restrictions: WBAT LLE       Mobility Bed Mobility Overal bed mobility: Needs Assistance Bed Mobility: Supine to Sit     Supine to sit: Mod assist;HOB elevated     General bed mobility comments: pt declining  sitting EOB due to nausea, performed all activity at bed level.    Transfers Overall transfer level: Needs assistance Equipment used: Rolling walker (2 wheels) Transfers: Sit to/from Stand Sit to Stand: Mod assist;From elevated surface   Step pivot transfers: Min assist       General transfer comment: Assist to power to stand from EOB with cues for hand placement and techniqe. Stood from EOB x2, dizziness worsening with standing duration. ABle to take a few steps to get to chair with assist for RW management and min A for balance. + dizziness.     Balance Overall balance assessment: Needs assistance Sitting-balance support: No upper extremity supported;Feet supported Sitting balance-Leahy Scale: Fair     Standing balance support: During functional activity Standing balance-Leahy Scale: Poor Standing balance comment: reliant on external support and close Minguard due to dizziness.                           ADL either performed or assessed with clinical judgement   ADL Overall ADL's : Needs assistance/impaired Eating/Feeding: Set up;Sitting   Grooming: Set up;Sitting                                      Extremity/Trunk Assessment Upper Extremity Assessment Upper Extremity Assessment: Overall WFL for tasks assessed;Generalized weakness   Lower Extremity Assessment Lower Extremity Assessment: Defer to PT evaluation        Vision   Vision Assessment?: No apparent visual deficits   Perception Perception Perception: Not tested   Praxis Praxis Praxis: Not tested    Cognition Arousal/Alertness: Awake/alert  Behavior During Therapy: WFL for tasks assessed/performed Overall Cognitive Status: Impaired/Different from baseline Area of Impairment: Orientation;Problem solving;Memory;Following commands                 Orientation Level: Disoriented to;Place;Time;Situation   Memory: Decreased short-term memory Following Commands: Follows  one step commands with increased time     Problem Solving: Requires verbal cues General Comments: pt A and O x3, unaware of specific place          Exercises Exercises: General Upper Extremity General Exercises - Upper Extremity Shoulder Flexion: AROM;Both;10 reps (long sitting) Elbow Flexion: AROM;Both;10 reps (long sitting) Elbow Extension: AROM;Both;10 reps (long sitting)   Shoulder Instructions       General Comments BP taken at rest, (142/75), and after activity/RN gave BP medication during session (129/60)    Pertinent Vitals/ Pain       Pain Assessment: Faces Pain Score: 2  Faces Pain Scale: Hurts a little bit Pain Location: neck and LUE Pain Descriptors / Indicators: Discomfort Pain Intervention(s): Limited activity within patient's tolerance;Monitored during session  Home Living                                          Prior Functioning/Environment              Frequency  Min 2X/week        Progress Toward Goals  OT Goals(current goals can now be found in the care plan section)  Progress towards OT goals: Progressing toward goals  Acute Rehab OT Goals Patient Stated Goal: return home OT Goal Formulation: With patient Time For Goal Achievement: 02/05/21 Potential to Achieve Goals: Good ADL Goals Pt Will Perform Lower Body Bathing: with modified independence;with adaptive equipment;sitting/lateral leans;sit to/from stand Pt Will Perform Lower Body Dressing: with modified independence;with adaptive equipment;sitting/lateral leans;sit to/from stand Pt Will Transfer to Toilet: with min assist;ambulating Pt Will Perform Toileting - Clothing Manipulation and hygiene: with modified independence;with adaptive equipment;sitting/lateral leans;sit to/from stand Additional ADL Goal #1: Pt will problem solve 3 fall prevention techniques that she can use at home.  Plan Discharge plan remains appropriate;Frequency remains appropriate     Co-evaluation                 AM-PAC OT "6 Clicks" Daily Activity     Outcome Measure   Help from another person eating meals?: A Little Help from another person taking care of personal grooming?: A Little Help from another person toileting, which includes using toliet, bedpan, or urinal?: A Lot Help from another person bathing (including washing, rinsing, drying)?: A Lot Help from another person to put on and taking off regular upper body clothing?: A Little Help from another person to put on and taking off regular lower body clothing?: A Lot 6 Click Score: 15    End of Session    OT Visit Diagnosis: Unsteadiness on feet (R26.81);Other abnormalities of gait and mobility (R26.89);Muscle weakness (generalized) (M62.81);History of falling (Z91.81)   Activity Tolerance Patient tolerated treatment well (minimal pain noted with LUE activity, advised pt to complete exercises but stop if/when it becomes painful)   Patient Left in bed;with call bell/phone within reach;with bed alarm set   Nurse Communication Mobility status;Other (comment) (RN notified of L neck/LUE pain)        Time: 1025-8527 OT Time Calculation (min): 31 min  Charges: OT General Charges $OT Visit: 1  Visit OT Treatments $Therapeutic Activity: 23-37 mins  Lynnda Child, OTD, OTR/L Acute Rehab [phone] - [place] 01/31/2021, 3:19 PM

## 2021-01-31 NOTE — Plan of Care (Signed)

## 2021-01-31 NOTE — Progress Notes (Signed)
PROGRESS NOTE    [name] [name]  WCB:[id number] DOB: Nov 05, 1929 DOA: 01/20/2021 PCP: Ginger Organ., MD    Brief Narrative:  87 years old female with past medical history of dementia, CKD stage IV, diabetes mellitus, hypertension, chronic diastolic heart failure, left lower extremity DVT on anticoagulation presented to hospital after sustaining a mechanical fall.  In the ED, patient was noted to have left hip fracture with elevated creatinine at 3.2 from baseline of 2.1.  Patient was then admitted to the hospital for further evaluation and treatment.  Assessment & Plan:   Principal Problem:   Closed left hip fracture, initial encounter ([place]) Active Problems:   DM2 (diabetes mellitus, type 2) ([place])   Essential hypertension   CKD (chronic kidney disease) stage 4, GFR 15-29 ml/min ([facility])   Alzheimer's dementia ([name])   Food impaction of esophagus  Closed left hip fracture, initial encounter Orthopedics was consulted and patient underwent IM nailing on 01/21/2021 by Dr. [name].  Patient was seen by physical therapy who recommended skilled nursing facility placement for short-term rehab.  Continue Eliquis.  Food impaction, possibility of esophageal motility disorder  CT scan of the abdomen and pelvis showed distal esophageal lumen impacted bolus.  GI was consulted and underwent removal of food bolus.  Currently patient has been advanced from clears to full liquids.  Will advance to soft diet today.  Patient might need outpatient follow-up with GI for possible evaluation of achalasia with treatment in the outpatient setting.  GI has recommended Protonix 40 twice a day for 4 weeks followed by once a day.  Patient will have to follow-up with Dr. Therisa Doyne as outpatient in 2 to 3 months to discuss EGD with Botox.  Acute kidney injury on CKD4 Baseline creatinine of 2.1.  Creatinine was 3.3 on admission.  Creatinine has improved to 1.4 at this time.  Antihypertensives and diuretic on hold at this  time  Orthostatic hypotension Patient still orthostatic with symptoms this morning.  Continue to hold antihypertensives.  On IV fluids.  Apply elastic compression stockings.  Add abdominal binder.  Check orthostatic blood pressure in AM and every shift.     Type 2 diabetes mellitus Recent hemoglobin A1c was 7.9 on 01/20/2021.  Continue to hold oral hypoglycemic agents.  Continue sliding scale insulin.  Essential hypertension On IV metoprolol at this time.  Lasix and ARB on hold including hydralazine due to orthostatic hypotension.  Will likely consider low-dose metoprolol on discharge.   Chronic diastolic CHF/Mild pulmonary hypertension Diuretics on hold.  Continue IV fluids.  Currently compensated.   History of DVT  On Eliquis.    Anxiety, depression On Valium as outpatient.   History of dementia Currently at baseline.  DVT prophylaxis: Eliquis  Code Status: Full code.  Family Communication:  I spoke with the patient's daughter Ms. [name] on the phone and updated her about the clinical condition of the patient on 11/21  Disposition: Skilled Nursing facility likely tomorrow.  Status is: Inpatient  Remains inpatient appropriate because: Severity of illness, orthostatic hypotension,  advanced to soft diet today    Consultants:  Orthopedics: Dr.Xu 01/21/2021 Gastroenterology   Procedures:  EGD with removal of food bolus on 01/29/2021  Antimicrobials:  IV Ancef 01/21/2021>>>> 01/22/2021  Subjective: Today, patient was seen and examined at bedside.  Patient denies any chest pain, nausea, vomiting.  Has  tolerated full liquids.    Objective: Vitals:   01/30/21 1915 01/30/21 2022 01/31/21 0600 01/31/21 0903  BP:  122/73 Marland Kitchen)  178/79   Pulse:  100 92 95  Resp:  15    Temp:  98.6 F (37 C)  98.4 F (36.9 C)  TempSrc:  Oral  Oral  SpO2: 96% 97%    Weight:      Height:        Intake/Output Summary (Last 24 hours) at 01/31/2021 1400 Last data filed at 01/31/2021  0933 Gross per 24 hour  Intake 3530.97 ml  Output 1000 ml  Net 2530.97 ml    Filed Weights   01/28/21 0500 01/29/21 1341 01/30/21 0500  Weight: 77.2 kg 77.2 kg 77.5 kg   Physical examination: General:  Average built, not in obvious distress HENT:   No scleral pallor or icterus noted. Oral mucosa is dry.  Chin tremors noted. Chest:   Diminished breath sounds bilaterally. No crackles or wheezes.  CVS: S1 &S2 heard. No murmur.  Regular rate and rhythm.   Abdomen: Soft, nontender, nondistended.  Bowel sounds are heard.   Extremities: No cyanosis, clubbing or edema.  Peripheral pulses are palpable. Psych: Alert, awake and oriented, normal mood CNS:  No cranial nerve deficits.  Power equal in all extremities.   Skin: Warm and dry.  No rashes noted.  Data Reviewed: I have personally reviewed the following labs and imaging studies.    CBC: Recent Labs  Lab 01/26/21 0156 01/27/21 0237 01/28/21 0817 01/29/21 0326 01/30/21 0311  WBC 8.8 9.4 10.5 10.8* 10.9*  NEUTROABS  --   --  7.3 7.2 7.2  HGB 9.7* 10.7* 10.1* 10.2* 9.3*  HCT 29.9* 33.4* 32.4* 31.8* 29.9*  MCV 94.9 94.9 96.7 96.1 97.1  PLT 232 281 294 300 347    Basic Metabolic Panel: Recent Labs  Lab 01/26/21 0156 01/27/21 0237 01/28/21 0817 01/29/21 0326 01/30/21 0311  NA 137 137 138 140 139  K 4.3 4.7 4.1 4.1 3.7  CL 108 105 107 111 113*  CO2 22 22 21* 20* 22  GLUCOSE 145* 173* 163* 146* 154*  BUN 21 23 27* 25* 27*  CREATININE 1.72* 1.79* 1.67* 1.57* 1.43*  CALCIUM 10.1 10.6* 10.3 9.8 9.5  MG  --  1.9 1.9  --  1.7     GFR: Estimated Creatinine Clearance: 26.3 mL/min (A) (by C-G formula based on SCr of 1.43 mg/dL (H)).  Liver Function Tests: No results for input(s): AST, ALT, ALKPHOS, BILITOT, PROT, ALBUMIN in the last 168 hours.   CBG: Recent Labs  Lab 01/30/21 2024 01/31/21 0014 01/31/21 0551 01/31/21 0813 01/31/21 1135  GLUCAP 116* 122* 143* 116* 148*     Recent Results (from the past 240  hour(s))  SARS CORONAVIRUS 2 (TAT 6-24 HRS) Nasopharyngeal Nasopharyngeal Swab     Status: None   Collection Time: 01/25/21  6:35 PM   Specimen: Nasopharyngeal Swab  Result Value Ref Range Status   SARS Coronavirus 2 NEGATIVE NEGATIVE Final    Comment: (NOTE) SARS-CoV-2 target nucleic acids are NOT DETECTED.  The SARS-CoV-2 RNA is generally detectable in upper and lower respiratory specimens during the acute phase of infection. Negative results do not preclude SARS-CoV-2 infection, do not rule out co-infections with other pathogens, and should not be used as the sole basis for treatment or other patient management decisions. Negative results must be combined with clinical observations, patient history, and epidemiological information. The expected result is Negative.  Fact Sheet for Patients: SugarRoll.be  Fact Sheet for Healthcare Providers: https://www.woods-mathews.com/  This test is not yet approved or cleared by the [place] FDA  and  has been authorized for detection and/or diagnosis of SARS-CoV-2 by FDA under an Emergency Use Authorization (EUA). This EUA will remain  in effect (meaning this test can be used) for the duration of the COVID-19 declaration under Se ction 564(b)(1) of the Act, 21 U.S.C. section 360bbb-3(b)(1), unless the authorization is terminated or revoked sooner.  Performed at [facility], [place] [address], [place], [place] [address]       Radiology Studies: No results found.   Scheduled Meds:  apixaban  2.5 mg Oral BID   insulin aspart  0-9 Units Subcutaneous Q4H   metoprolol succinate  12.5 mg Oral Daily   pantoprazole (PROTONIX) IV  40 mg Intravenous Q24H   Continuous Infusions:  sodium chloride 125 mL/hr at 01/29/21 1032   methocarbamol (ROBAXIN) IV       LOS: 11 days    Flora Lipps, MD [facility] 01/31/2021, 2:00 PM

## 2021-01-31 NOTE — Progress Notes (Signed)
Physical Therapy Treatment Patient Details Name: [name] [name] MRN: [id number] DOB: 1929/12/12 Today's Date: 01/31/2021   History of Present Illness KERON KOFFMAN is a 87 y.o. female who presented to the ED after a fall resulting in a L intertrochanteric hip fx; s/p IM Nail, WBAT; with medical history significant of Alzheimer's dz, DM2, HTN, CKD 4, chronic anticoagulation on eliquis (?prior DVT?).    PT Comments    Patient progressing very slowly towards PT goals. Limited mainly by positive orthostatic hypotension and dizziness. Pt very confused today not knowing where she was or how she got here, perseverating on this topic for the entire session with no recall of information being told. Wrapped LEs with ace wraps prior to mobility and this seemed to help slightly with drop in BP, however still positive and symptomatic. See below. Supine BP 144/79, HR 75 bpm Sitting BP 104/75 HR 146 bpm Standing BP 93/55, HR 141 bpm BP in chair post transfer 131/79, HR 140 bpm   Tolerated taking steps to the chair with Min A and assist with RW management. Recommend increasing activity and sitting up more to try to improve BP tolerance. Continues to be appropriate for SNF. Will follow.   Recommendations for follow up therapy are one component of a multi-disciplinary discharge planning process, led by the attending physician.  Recommendations may be updated based on patient status, additional functional criteria and insurance authorization.  Follow Up Recommendations  Skilled nursing-short term rehab (<3 hours/day)     Assistance Recommended at Discharge Frequent or constant Supervision/Assistance  Equipment Recommendations  Rolling walker (2 wheels);BSC/3in1    Recommendations for Other Services       Precautions / Restrictions Precautions Precautions: Fall;Other (comment) Precaution Comments: orthostatic (symptomatic) Restrictions Weight Bearing Restrictions: Yes LLE Weight Bearing: Weight  bearing as tolerated     Mobility  Bed Mobility Overal bed mobility: Needs Assistance Bed Mobility: Supine to Sit     Supine to sit: Mod assist;HOB elevated     General bed mobility comments: Assist to bring LEs and trunk to EOB with use of rail. Step by step cues needed. + dizziness.    Transfers Overall transfer level: Needs assistance Equipment used: Rolling walker (2 wheels) Transfers: Sit to/from Stand Sit to Stand: Mod assist;From elevated surface     Step pivot transfers: Min assist     General transfer comment: Assist to power to stand from EOB with cues for hand placement and techniqe. Stood from EOB x2, dizziness worsening with standing duration. ABle to take a few steps to get to chair with assist for RW management and min A for balance. + dizziness.    Ambulation/Gait               General Gait Details: Deferred due to dizziness and orthostasis   Stairs             Wheelchair Mobility    Modified Rankin (Stroke Patients Only)       Balance Overall balance assessment: Needs assistance Sitting-balance support: No upper extremity supported;Feet supported Sitting balance-Leahy Scale: Fair     Standing balance support: During functional activity Standing balance-Leahy Scale: Poor Standing balance comment: reliant on external support and close Minguard due to dizziness.                            Cognition Arousal/Alertness: Awake/alert Behavior During Therapy: WFL for tasks assessed/performed Overall Cognitive Status: Impaired/Different from baseline Area of  Impairment: Orientation;Problem solving;Memory;Following commands                 Orientation Level: Disoriented to;Place;Time;Situation   Memory: Decreased short-term memory Following Commands: Follows one step commands with increased time     Problem Solving: Requires verbal cues General Comments: Pt very confused this AM, "why am I here?" despite being told  numerous times where she was and why she was here, repeating same question throughout session. Perseverating on needing to call family to let them know she is here etc (they already know), cognitive different then prior session. Poor memory and recall.        Exercises      General Comments General comments (skin integrity, edema, etc.): Postitive orthostatics. Supine BP 144/79, HR 75 bpm, Sitting BP 104/75 HR 146 bpm, Standing BP 93/55, HR 141 bpm, BP in chair post transfer 131/79, HR 140 bpm, dizziness throughut change in position.      Pertinent Vitals/Pain Pain Assessment: Faces Faces Pain Scale: Hurts a little bit Pain Location: Lft hip with mobility Pain Descriptors / Indicators: Grimacing;Guarding;Discomfort Pain Intervention(s): Monitored during session;Limited activity within patient's tolerance;Repositioned    Home Living                          Prior Function            PT Goals (current goals can now be found in the care plan section) Progress towards PT goals: Progressing toward goals (slowly due to BP, dizziness)    Frequency    Min 3X/week      PT Plan Current plan remains appropriate    Co-evaluation              AM-PAC PT "6 Clicks" Mobility   Outcome Measure  Help needed turning from your back to your side while in a flat bed without using bedrails?: A Lot Help needed moving from lying on your back to sitting on the side of a flat bed without using bedrails?: A Lot Help needed moving to and from a bed to a chair (including a wheelchair)?: A Lot Help needed standing up from a chair using your arms (e.g., wheelchair or bedside chair)?: A Lot Help needed to walk in hospital room?: A Little Help needed climbing 3-5 steps with a railing? : Total 6 Click Score: 12    End of Session Equipment Utilized During Treatment: Gait belt Activity Tolerance: Treatment limited secondary to medical complications (Comment) (orthstasis) Patient  left: in chair;with call bell/phone within reach;with chair alarm set Nurse Communication: Mobility status;Other (comment) (orthostatic BP, confusion) PT Visit Diagnosis: Unsteadiness on feet (R26.81);Other abnormalities of gait and mobility (R26.89);Pain Pain - Right/Left: Left Pain - part of body: Hip     Time: 9150-5697 PT Time Calculation (min) (ACUTE ONLY): 28 min  Charges:  $Therapeutic Activity: 23-37 mins                     Marisa Severin, PT, DPT Acute Rehabilitation Services Pager [phone] Office [phone]      Marguarite Arbour A Sabra Heck 01/31/2021, 1:21 PM

## 2021-02-01 ENCOUNTER — Other Ambulatory Visit: Payer: Self-pay | Admitting: Adult Health

## 2021-02-01 DIAGNOSIS — I951 Orthostatic hypotension: Secondary | ICD-10-CM

## 2021-02-01 LAB — GLUCOSE, CAPILLARY
Glucose-Capillary: 123 mg/dL — ABNORMAL HIGH (ref 70–99)
Glucose-Capillary: 138 mg/dL — ABNORMAL HIGH (ref 70–99)

## 2021-02-01 LAB — SARS CORONAVIRUS 2 (TAT 6-24 HRS): SARS Coronavirus 2: NEGATIVE

## 2021-02-01 MED ORDER — OXYCODONE-ACETAMINOPHEN 5-325 MG PO TABS: 1.0000 | ORAL_TABLET | Freq: Three times a day (TID) | ORAL | 0 refills | Status: DC | PRN

## 2021-02-01 MED ORDER — FUROSEMIDE 40 MG PO TABS: 20.0000 mg | ORAL_TABLET | Freq: Every day | ORAL | Status: DC | PRN

## 2021-02-01 MED ORDER — DIAZEPAM 5 MG PO TABS
5.0000 mg | ORAL_TABLET | Freq: Two times a day (BID) | ORAL | 0 refills | Status: DC | PRN
Start: 2021-02-01 — End: 2021-02-17

## 2021-02-01 MED ORDER — PANTOPRAZOLE SODIUM 40 MG PO TBEC: DELAYED_RELEASE_TABLET | ORAL | Status: DC

## 2021-02-01 MED ORDER — METOPROLOL SUCCINATE ER 25 MG PO TB24: 25.0000 mg | ORAL_TABLET | Freq: Every day | ORAL | Status: DC

## 2021-02-01 MED ORDER — HYDRALAZINE HCL 50 MG PO TABS: 25.0000 mg | ORAL_TABLET | Freq: Three times a day (TID) | ORAL | Status: DC | PRN

## 2021-02-01 NOTE — TOC Transition Note (Signed)
Transition of Care [facility]) - CM/SW Discharge Note   Patient Details  Name: [name] [name] MRN: [id number] Date of Birth: 1929/05/29  [facility] CM/SW Contact:  Joanne Chars, LCSW Phone Number: 02/01/2021, 9:56 AM   Clinical Narrative:  Pt discharging to [facility] [place].  RN call [phone] for report.     Final next level of care: Skilled Nursing Facility Barriers to Discharge: Barriers Resolved   Patient Goals and CMS Choice        Discharge Placement              Patient chooses bed at:  [facility]) Patient to be transferred to facility by: [place] Name of family member notified: daughter [name] Patient and family notified of of transfer: 02/01/21  Discharge Plan and Services                                     Social Determinants of Health (SDOH) Interventions     Readmission Risk Interventions No flowsheet data found.

## 2021-02-01 NOTE — Discharge Summary (Signed)
Physician Discharge Summary  [name] [name]:[id number] DOB: 1929/04/16 DOA: 01/20/2021  PCP: Ginger Organ., MD  Admit date: 01/20/2021 Discharge date: 02/01/2021  Time spent: 39  minutes  Recommendations for Outpatient Follow-up:  PCP at SNF in 3-5 days with CBC BMP. Follow-up with orthopedics Dr.Xu in 2 weeks for hip surgery follow-up Patient will have to follow-up with Dr. Therisa Doyne as outpatient in 2 to 3 months to discuss EGD with Botox. Antihypertensives and diuretic has been changed because of orthostatic hypotension.  Okay to keep the systolic blood pressure range around 140.  Please take orthostatic precautions on ambulation including use of elastic stockings and abdominal binder. Please keep the patient on chopped, mechanically soft diet.  Discharge Diagnoses:  Principal Problem:   Closed left hip fracture, initial encounter ([place]) Active Problems:   DM2 (diabetes mellitus, type 2) (HCC)   Essential hypertension   CKD (chronic kidney disease) stage 4, GFR 15-29 ml/min (HCC) Mild Alzheimer's dementia (HCC) AKI on CKD 4 Hypertension Chronic diastolic CHF Mild pulmonary hypertension History of DVT Orthostatic hypotenstion Food bolus impaction  Discharge Condition: Stable  Diet recommendation: Diabetic, low-sodium  History of present illness:  87 years old female with past medical history of dementia, CKD stage IV, diabetes mellitus, hypertension, chronic diastolic heart failure, left lower extremity DVT on anticoagulation presented to hospital after sustaining a mechanical fall.  In the ED, patient was noted to have left hip fracture with elevated creatinine at 3.2 from baseline of 2.1.  Patient was then admitted to the hospital for further evaluation and treatment.  Hospital Course:   Closed left hip fracture, initial encounter Orthopedics was consulted and patient underwent IM nailing on 01/21/2021 by Dr. [name].  Patient was seen by physical therapy who  recommended skilled nursing facility placement for short-term rehab.  Continue Eliquis.   Food impaction, possibility of esophageal motility disorder, achalasia  CT scan of the abdomen and pelvis showed distal esophageal lumen impacted bolus.  GI was consulted and underwent removal of food bolus.  Currently patient has been advanced from clears to full liquids.  Will advance to soft diet today.  Patient might need outpatient follow-up with GI for possible evaluation of achalasia with treatment in the outpatient setting.  GI has recommended Protonix 40 twice a day for 4 weeks followed by once a day.  Patient will have to follow-up with Dr. Therisa Doyne as outpatient in 2 to 3 months to discuss EGD with Botox.   Acute kidney injury on CKD4 Baseline creatinine of 2.1.  Creatinine was 3.3 on admission.  Creatinine has improved to 1.4 at this time.  Patient did receive IV fluids during hospitalization.   Orthostatic hypotension Received IV fluids..  Patient has been started on elastic compression stockings, abdominal binder.  Antihypertensive doses including diuretic has been changed.  Please take orthostatic precautions while ambulating.  Patient will however need to be ambulated.   Type 2 diabetes mellitus Recent hemoglobin A1c was 7.9 on 01/20/2021.  Continue home insulin regimen.  Essential hypertension Also had orthostatic hypotension so medications doses have changed on discharge.   Chronic diastolic CHF/Mild pulmonary hypertension Diuretics have been initiated at the lower dose.  Antihypertensives adjusted.   History of DVT  On Eliquis.    Anxiety, depression On Valium as outpatient.  Will resume on discharge.  Disposition.  At this time patient is stable for disposition to skilled nursing facility.  Spoke with the patient's daughter about the plan for disposition and follow-up.  Consultants:  Orthopedics Dr.Xu   Procedures: Left hip intramedullary nail Dr. [name] on 11/12    Discharge  Exam: Vitals:   01/31/21 2124 02/01/21 0812  BP: (!) 162/71 (!) 176/89  Pulse: 90 79  Resp: 15 17  Temp:  98.2 F (36.8 C)  SpO2: 97% 100%   General:  Average built, not in obvious distress HENT:   No scleral pallor or icterus noted. Oral mucosa is dry.  Chin tremors noted. Chest:   Diminished breath sounds bilaterally. No crackles or wheezes.  CVS: S1 &S2 heard. No murmur.  Regular rate and rhythm.   Abdomen: Soft, nontender, nondistended.  Bowel sounds are heard.   Extremities: No cyanosis, clubbing or edema.  Peripheral pulses are palpable. Psych: Alert, awake and communicative. CNS:  No cranial nerve deficits.  Power equal in all extremities.   Skin: Warm and dry.  No rashes noted.    Discharge Instructions  Discharge Instructions     Call MD for:  persistant nausea and vomiting   Complete by: As directed    Call MD for:  severe uncontrolled pain   Complete by: As directed    Call MD for:  temperature >100.4   Complete by: As directed    Diet - low sodium heart healthy   Complete by: As directed    Dysphagia III diet/mechanical soft diet.   Discharge instructions   Complete by: As directed    Please continue to use bilateral elastic stockings and abdominal binder.  Take time to change position especially from lying down to sitting up and ambulation and take orthostatic precautions.  Follow-up with your primary care provider at the skilled nursing facility in 3 to 5 days. Continue mechanical soft diet.  Continue medications as prescribed.   Increase activity slowly   Complete by: As directed    No wound care   Complete by: As directed    Weight bearing as tolerated   Complete by: As directed            Discharge Care Instructions  (From admission, onward)           Start     Ordered   01/21/21 0000  Weight bearing as tolerated        01/21/21 1006            Follow-up Information     Leandrew Koyanagi, MD Follow up in 2 week(s).   Specialty:  Orthopedic Surgery Why: For suture removal, For wound re-check Contact information: [place] [address] [phone]         Ronnette Juniper, MD Follow up in 2 month(s).   Specialty: Gastroenterology Why: Follow-up for dysphagia Contact information: [place] [address] [phone]                 The results of significant diagnostics from this hospitalization (including imaging, microbiology, ancillary and laboratory) are listed below for reference.    Significant Diagnostic Studies: CT ABDOMEN PELVIS WO CONTRAST  Result Date: 01/28/2021 CLINICAL DATA:  Nausea/vomiting Abdominal pain, acute, nonlocalized EXAM: CT ABDOMEN AND PELVIS WITHOUT CONTRAST TECHNIQUE: Multidetector CT imaging of the abdomen and pelvis was performed following the standard protocol without IV contrast. COMPARISON:  X-ray intraoperative left femur 01/21/2021 FINDINGS: Lower chest: Bilateral trace pleural effusions. Bilateral lower lobe passive atelectasis. Diffuse bronchial wall thickening. The distal esophageal lumen is dilated with impacted food bolus. Hepatobiliary: No focal liver abnormality. Nonspecific hydropic gallbladder. No gallstones, gallbladder wall  thickening, or pericholecystic fluid. No biliary dilatation. Pancreas: Diffusely atrophic. No focal lesion. Otherwise normal pancreatic contour. No surrounding inflammatory changes. No main pancreatic ductal dilatation. Spleen: Punctate calcifications likely represent sequelae of prior granulomatous disease. Normal in size without focal abnormality. Adrenals/Urinary Tract: No adrenal nodule bilaterally. Bilateral perinephric stranding. No nephrolithiasis and no hydronephrosis. There is a 1.3 cm hyperdense left renal lesion with a density of 74 Hounsfield units consistent with a hemorrhagic or proteinaceous cyst. No ureterolithiasis or hydroureter. The urinary bladder is unremarkable. Stomach/Bowel: Findings  suggestive fundoplication repair with markedly limited evaluation due to streak artifact from the surgical staples. Otherwise stomach is within normal limits. No evidence of bowel wall thickening or dilatation. Vascular/Lymphatic: No abdominal aorta or iliac aneurysm. Severe atherosclerotic plaque of the aorta and its branches. No abdominal, pelvic, or inguinal lymphadenopathy. Reproductive: Status post hysterectomy. No adnexal masses. Other: No intraperitoneal free fluid. No intraperitoneal free gas. No organized fluid collection. Musculoskeletal: No abdominal wall hernia or abnormality. Diffusely decreased bone density. L3 through L5 laminectomy and posterolateral fusion. No suspicious lytic or blastic osseous lesions. No acute displaced fracture. Multilevel degenerative changes of the spine. Partially visualized intramedullary nail fixation of the proximal left femur in a patient with a known acute intratrochanteric fracture. IMPRESSION: 1. Dilatation of distal esophageal lumen with impacted food bolus in the setting of prior fundoplication/hiatal hernia repair. 2. Bilateral trace pleural effusions. 3.  Aortic Atherosclerosis (ICD10-I70.0). These results will be called to the ordering clinician or representative by the Radiologist Assistant, and communication documented in the PACS or [employer]. Electronically Signed   By: Iven Finn M.D.   On: 01/28/2021 22:15   DG Chest 1 View  Result Date: 01/20/2021 CLINICAL DATA:  Status post fall.  Hip fracture. EXAM: CHEST  1 VIEW COMPARISON:  Chest x-ray 01/07/2021 FINDINGS: The heart and mediastinal contours are unchanged. Aortic calcification. No focal consolidation. No pulmonary edema. No pleural effusion. No pneumothorax. No acute osseous abnormality. IMPRESSION: No active disease. Electronically Signed   By: Iven Finn M.D.   On: 01/20/2021 19:11   DG Chest 2 View  Result Date: 01/07/2021 CLINICAL DATA:  Cough. EXAM: CHEST - 2 VIEW  COMPARISON:  06/24/2020 FINDINGS: The cardiac silhouette, mediastinal and hilar contours are normal. Minimal streaky basilar scarring changes but no infiltrates, edema or effusions. No pulmonary lesions. The bony thorax is intact. IMPRESSION: Minimal basilar scarring changes but no infiltrates, edema or effusions. Electronically Signed   By: Marijo Sanes M.D.   On: 01/07/2021 13:11   CT HEAD WO CONTRAST (5MM)  Result Date: 01/20/2021 CLINICAL DATA:  Fall, headache EXAM: CT HEAD WITHOUT CONTRAST CT CERVICAL SPINE WITHOUT CONTRAST TECHNIQUE: Multidetector CT imaging of the head and cervical spine was performed following the standard protocol without intravenous contrast. Multiplanar CT image reconstructions of the cervical spine were also generated. COMPARISON:  None. FINDINGS: CT HEAD FINDINGS BRAIN: BRAIN Cerebral ventricle sizes are concordant with the degree of cerebral volume loss. Patchy and confluent areas of decreased attenuation are noted throughout the deep and periventricular white matter of the cerebral hemispheres bilaterally, compatible with chronic microvascular ischemic disease. No evidence of large-territorial acute infarction. No parenchymal hemorrhage. No mass lesion. No extra-axial collection. No mass effect or midline shift. No hydrocephalus. Basilar cisterns are patent. Vascular: No hyperdense vessel. Atherosclerotic calcifications are present within the cavernous internal carotid arteries. Skull: No acute fracture or focal lesion. Sinuses/Orbits: Paranasal sinuses and mastoid air cells are clear. Bilateral lens replacement. Otherwise the orbits are  unremarkable. Other: None. CT CERVICAL SPINE FINDINGS Alignment: Normal. Skull base and vertebrae: Multilevel degenerative changes of the spine. No definite severe osseous neural foraminal or central canal stenosis. No acute fracture. No aggressive appearing focal osseous lesion or focal pathologic process. Soft tissues and spinal canal: No  prevertebral fluid or swelling. No visible canal hematoma. Upper chest: Unremarkable. Other: None. IMPRESSION: 1. No acute intracranial abnormality. 2. No acute displaced fracture or traumatic listhesis of the cervical spine. Electronically Signed   By: Iven Finn M.D.   On: 01/20/2021 21:10   CT Cervical Spine Wo Contrast  Result Date: 01/20/2021 CLINICAL DATA:  Fall, headache EXAM: CT HEAD WITHOUT CONTRAST CT CERVICAL SPINE WITHOUT CONTRAST TECHNIQUE: Multidetector CT imaging of the head and cervical spine was performed following the standard protocol without intravenous contrast. Multiplanar CT image reconstructions of the cervical spine were also generated. COMPARISON:  None. FINDINGS: CT HEAD FINDINGS BRAIN: BRAIN Cerebral ventricle sizes are concordant with the degree of cerebral volume loss. Patchy and confluent areas of decreased attenuation are noted throughout the deep and periventricular white matter of the cerebral hemispheres bilaterally, compatible with chronic microvascular ischemic disease. No evidence of large-territorial acute infarction. No parenchymal hemorrhage. No mass lesion. No extra-axial collection. No mass effect or midline shift. No hydrocephalus. Basilar cisterns are patent. Vascular: No hyperdense vessel. Atherosclerotic calcifications are present within the cavernous internal carotid arteries. Skull: No acute fracture or focal lesion. Sinuses/Orbits: Paranasal sinuses and mastoid air cells are clear. Bilateral lens replacement. Otherwise the orbits are unremarkable. Other: None. CT CERVICAL SPINE FINDINGS Alignment: Normal. Skull base and vertebrae: Multilevel degenerative changes of the spine. No definite severe osseous neural foraminal or central canal stenosis. No acute fracture. No aggressive appearing focal osseous lesion or focal pathologic process. Soft tissues and spinal canal: No prevertebral fluid or swelling. No visible canal hematoma. Upper chest: Unremarkable.  Other: None. IMPRESSION: 1. No acute intracranial abnormality. 2. No acute displaced fracture or traumatic listhesis of the cervical spine. Electronically Signed   By: Iven Finn M.D.   On: 01/20/2021 21:10   CT Knee Left Wo Contrast  Result Date: 01/20/2021 CLINICAL DATA:  Knee trauma, tenderness or effusion EXAM: CT OF THE  KNEE WITHOUT CONTRAST TECHNIQUE: Multidetector CT imaging of the left knee was performed according to the standard protocol. Multiplanar CT image reconstructions were also generated. COMPARISON:  X-ray left knee 01/20/2021 FINDINGS: Bones/Joint/Cartilage Moderate to severe tricompartmental degenerative changes of the knee. Question associated chondrocalcinosis. Slight medial subluxation of the femur in relation to the patella and tibia likely due to degenerative changes. No acute displaced fracture or dislocation. Trace associated joint effusion. Ligaments Suboptimally assessed by CT. Muscles and Tendons Grossly unremarkable. Soft tissues Unremarkable.  Vascular calcifications. IMPRESSION: No acute displaced fracture or dislocation in a patient with moderate to severe tricompartmental degenerative changes. Slight medial subluxation of the femur in relation to the patella and tibia likely due to degenerative changes. Electronically Signed   By: Iven Finn M.D.   On: 01/20/2021 21:25   DG Knee Complete 4 Views Left  Result Date: 01/20/2021 CLINICAL DATA:  Fall, left knee pain EXAM: LEFT KNEE - COMPLETE 4+ VIEW COMPARISON:  None. FINDINGS: Moderate tricompartmental degenerative changes. Chondrocalcinosis in the medial and lateral compartments. No fracture or dislocation is seen. Visualized soft tissues are within normal limits. No suprapatellar knee joint effusion. IMPRESSION: Moderate degenerative changes, as above. Electronically Signed   By: Julian Hy M.D.   On: 01/20/2021 19:22   DG  Abd 2 Views  Result Date: 01/26/2021 CLINICAL DATA:  Emesis EXAM: ABDOMEN - 2  VIEW COMPARISON:  07/25/2009 FINDINGS: Multiple clips in the region of the stomach. Normal bowel gas pattern. No obstruction or free air. ORIF left hip. Pedicle screw and interbody fusion L3-4. Pedicle screws in L5 not connected to L4. IMPRESSION: Normal bowel gas pattern. Electronically Signed   By: Franchot Gallo M.D.   On: 01/26/2021 13:27   DG C-Arm 1-60 Min-No Report  Result Date: 01/21/2021 Fluoroscopy was utilized by the requesting physician.  No radiographic interpretation.   DG Hip Unilat With Pelvis 2-3 Views Left  Result Date: 01/20/2021 CLINICAL DATA:  Fall, left hip pain EXAM: DG HIP (WITH OR WITHOUT PELVIS) 2-3V LEFT COMPARISON:  None. FINDINGS: Intertrochanteric left hip fracture, mildly displaced. Right hip is intact. Visualized bony pelvis appears intact. Lumbar spine fixation hardware, incompletely visualized. IMPRESSION: Intertrochanteric left hip fracture, mildly displaced. Electronically Signed   By: Julian Hy M.D.   On: 01/20/2021 19:22   DG FEMUR MIN 2 VIEWS LEFT  Result Date: 01/21/2021 CLINICAL DATA:  Surgery, elective Z41.9 (ICD-10-CM) EXAM: LEFT FEMUR 2 VIEWS COMPARISON:  January 20, 2021. FINDINGS: Fluoro time: 1 minute 28 seconds. Reported radiation dose: 6.82 mGy. Five C-arm fluoroscopic images were obtained intraoperatively and submitted for post operative interpretation. These images demonstrate intramedullary rod and screw fixation a intertrochanteric left femur fracture. Improved alignment, near anatomic. Please see the performing provider's procedural report for further detail. IMPRESSION: Intraoperative fluoroscopy, as detailed above. Electronically Signed   By: Margaretha Sheffield M.D.   On: 01/21/2021 10:28    Microbiology: Recent Results (from the past 240 hour(s))  SARS CORONAVIRUS 2 (TAT 6-24 HRS) Nasopharyngeal Nasopharyngeal Swab     Status: None   Collection Time: 01/25/21  6:35 PM   Specimen: Nasopharyngeal Swab  Result Value Ref Range Status    SARS Coronavirus 2 NEGATIVE NEGATIVE Final    Comment: (NOTE) SARS-CoV-2 target nucleic acids are NOT DETECTED.  The SARS-CoV-2 RNA is generally detectable in upper and lower respiratory specimens during the acute phase of infection. Negative results do not preclude SARS-CoV-2 infection, do not rule out co-infections with other pathogens, and should not be used as the sole basis for treatment or other patient management decisions. Negative results must be combined with clinical observations, patient history, and epidemiological information. The expected result is Negative.  Fact Sheet for Patients: SugarRoll.be  Fact Sheet for Healthcare Providers: https://www.woods-mathews.com/  This test is not yet approved or cleared by the [place] FDA and  has been authorized for detection and/or diagnosis of SARS-CoV-2 by FDA under an Emergency Use Authorization (EUA). This EUA will remain  in effect (meaning this test can be used) for the duration of the COVID-19 declaration under Se ction 564(b)(1) of the Act, 21 U.S.C. section 360bbb-3(b)(1), unless the authorization is terminated or revoked sooner.  Performed at [facility], [place] [address] [place], [place] [address]   SARS CORONAVIRUS 2 (TAT 6-24 HRS) Nasopharyngeal Nasopharyngeal Swab     Status: None   Collection Time: 01/31/21  4:57 PM   Specimen: Nasopharyngeal Swab  Result Value Ref Range Status   SARS Coronavirus 2 NEGATIVE NEGATIVE Final    Comment: (NOTE) SARS-CoV-2 target nucleic acids are NOT DETECTED.  The SARS-CoV-2 RNA is generally detectable in upper and lower respiratory specimens during the acute phase of infection. Negative results do not preclude SARS-CoV-2 infection, do not rule out co-infections with other pathogens, and should not  be used as the sole basis for treatment or other patient management decisions. Negative results must be combined with clinical  observations, patient history, and epidemiological information. The expected result is Negative.  Fact Sheet for Patients: SugarRoll.be  Fact Sheet for Healthcare Providers: https://www.woods-mathews.com/  This test is not yet approved or cleared by the [place] FDA and  has been authorized for detection and/or diagnosis of SARS-CoV-2 by FDA under an Emergency Use Authorization (EUA). This EUA will remain  in effect (meaning this test can be used) for the duration of the COVID-19 declaration under Se ction 564(b)(1) of the Act, 21 U.S.C. section 360bbb-3(b)(1), unless the authorization is terminated or revoked sooner.  Performed at [facility], [place] [address], [place], [place] [address]      Labs: Basic Metabolic Panel: Recent Labs  Lab 01/26/21 0156 01/27/21 0237 01/28/21 0817 01/29/21 0326 01/30/21 0311  NA 137 137 138 140 139  K 4.3 4.7 4.1 4.1 3.7  CL 108 105 107 111 113*  CO2 22 22 21* 20* 22  GLUCOSE 145* 173* 163* 146* 154*  BUN 21 23 27* 25* 27*  CREATININE 1.72* 1.79* 1.67* 1.57* 1.43*  CALCIUM 10.1 10.6* 10.3 9.8 9.5  MG  --  1.9 1.9  --  1.7    Liver Function Tests: No results for input(s): AST, ALT, ALKPHOS, BILITOT, PROT, ALBUMIN in the last 168 hours.  No results for input(s): LIPASE, AMYLASE in the last 168 hours. No results for input(s): AMMONIA in the last 168 hours. CBC: Recent Labs  Lab 01/26/21 0156 01/27/21 0237 01/28/21 0817 01/29/21 0326 01/30/21 0311  WBC 8.8 9.4 10.5 10.8* 10.9*  NEUTROABS  --   --  7.3 7.2 7.2  HGB 9.7* 10.7* 10.1* 10.2* 9.3*  HCT 29.9* 33.4* 32.4* 31.8* 29.9*  MCV 94.9 94.9 96.7 96.1 97.1  PLT 232 281 294 300 272    Cardiac Enzymes: No results for input(s): CKTOTAL, CKMB, CKMBINDEX, TROPONINI in the last 168 hours.  BNP: BNP (last 3 results) No results for input(s): BNP in the last 8760 hours.  ProBNP (last 3 results) No results for input(s): PROBNP  in the last 8760 hours.  CBG: Recent Labs  Lab 01/31/21 2030 01/31/21 2126 01/31/21 2341 02/01/21 0433 02/01/21 0814  GLUCAP 141* 120* 103* 123* 138*      Signed:  Flora Lipps MD.  [facility] 02/01/2021, 9:20 AM

## 2021-02-01 NOTE — Progress Notes (Signed)
Mobility Specialist Progress Note   02/01/21 1059  Mobility  Activity Off unit   Currently being transported for d/c to SNF.  Holland Falling Mobility Specialist Phone Number [phone]

## 2021-02-03 ENCOUNTER — Non-Acute Institutional Stay (SKILLED_NURSING_FACILITY): Payer: Medicare Other | Admitting: Internal Medicine

## 2021-02-03 ENCOUNTER — Encounter: Payer: Self-pay | Admitting: Internal Medicine

## 2021-02-03 DIAGNOSIS — T18128D Food in esophagus causing other injury, subsequent encounter: Secondary | ICD-10-CM

## 2021-02-03 DIAGNOSIS — S72142S Displaced intertrochanteric fracture of left femur, sequela: Secondary | ICD-10-CM

## 2021-02-03 DIAGNOSIS — Z794 Long term (current) use of insulin: Secondary | ICD-10-CM

## 2021-02-03 DIAGNOSIS — I10 Essential (primary) hypertension: Secondary | ICD-10-CM | POA: Diagnosis not present

## 2021-02-03 DIAGNOSIS — E1122 Type 2 diabetes mellitus with diabetic chronic kidney disease: Secondary | ICD-10-CM

## 2021-02-03 DIAGNOSIS — N184 Chronic kidney disease, stage 4 (severe): Secondary | ICD-10-CM

## 2021-02-03 NOTE — Assessment & Plan Note (Addendum)
Today's blood pressure is an outlier.  Prior blood pressures here at the SNF have ranged from 144/86 up to 148/95.  Antihypertensive medications were adjusted during the hospitalization because of postural hypotension.  Metoprolol will be changed to 25 mg twice daily as she has persistent systolic blood pressure greater than 140.  Isometrics were discussed with the patient; it is unlikely that she will be able to comply with this intervention due to her dementia.

## 2021-02-03 NOTE — Assessment & Plan Note (Signed)
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

## 2021-02-03 NOTE — Assessment & Plan Note (Signed)
Creatinine peaked @ 3.27 / GFR 13 nadir ; CKD Stage 5 Current creatinine 1.43 / GFR 35 ; CKD Stage 3B Medication List reviewed. No nephrotoxic agents identified.

## 2021-02-03 NOTE — Progress Notes (Signed)
NURSING HOME LOCATION:  [facility] ROOM NUMBER:  302  CODE STATUS: Full code  PCP: Rebeca Allegra., MD  This is a comprehensive admission note to this SNFperformed on this date less than 30 days from date of admission. Included are preadmission medical/surgical history; reconciled medication list; family history; social history and comprehensive review of systems.  Corrections and additions to the records were documented. Comprehensive physical exam was also performed. Additionally a clinical summary was entered for each active diagnosis pertinent to this admission in the Problem List to enhance continuity of care.  HPI: She was hospitalized 11/11 - 02/01/2021 with a closed left hip fracture.  She had fallen at home apparently due to orthostatic hypotension after standing.  She does have dementia but she denies any cardiac or neurologic prodrome prior to the fall.   In the ED she was noted to have AKI superimposed on CKD with a creatinine of 3.27 & GFR 13, CKD Stage 5.  Baseline creatinine was felt to be 2.1.   11/12  Dr [name] performed a left intratrochanteric intramedullary implant. Postop course was complicated by food impaction requiring EGD by Dr. Therisa Doyne.  The entire esophagus contained food and it took approximately an hour to remove the impaction.  No mobility was noted in the esophagus.  LA grade D esophagitis was noted.  Dr. Therisa Doyne suggested that she may have achalasia.  Botox versus myotomy was to be considered in 2-3 months following discharge.  She was placed on a chopped, mechanical soft diet. With IV fluids there was improvement in renal function and creatinine was 1.43 and GFR 35 prior to discharge.  This would be CKD stage IIIb. Orthostatic hypotension was documented.  Elastic stockings and abdominal binder were initiated.  Antihypertensive medications were adjusted and the systolic blood pressure goal was to be less than or equal to 140. Glucoses  ranged from 145 up to 195.  A1c was 7.9%, indicating adequate diabetic control based on her advanced age and multiple comorbidities. She remains on Eliquis because of history of DVT. She was discharged to the SNF for PT/OT. Monitor closely with ambulation due to history of postural hypotension.  Past medical and surgical history: Includes history of asthma,extrinsic rhinitis, GERD, sleep apnea, diabetes with CKD, and essential hypertension. Surgeries and procedures include fixation kyphoplasty of lumbar spine, hiatal hernia repair, lumbar spine surgery, and vaginal hysterectomy.  Social history: Nondrinker, non-smoker.  Family history: Noncontributory due to advanced age.   Review of systems: Clinical neurocognitive deficits made validity of responses questionable .She stated that she was doing "pretty good".  She also went on to state that she was "tired of bed".  When asked why she been in the hospital she stated "I just fell".  She did deny any cardiac or neurologic prodrome.  She validated that she had just arisen from her recliner when she fell to the floor sustaining the fracture. Most review of system questions were answered as "I am not sure, I cannot remember ".  Initially she did not remember that she had had the EGD.  Subsequently she stated they "pulled stuff out of me".  She cannot tell me why she had an elongated operative scar in the epigastrium stating "they took something out".  By history she has had a hiatal hernia repair.  Physical exam:  Pertinent or positive findings: She appears her stated age.  Facies are blank.  She has bilateral ptosis.  Eyebrows are decreased.She has a fine tremor  of the mandible.  Complete dentures are present.  The heart rhythm is slightly irregular and slight tachycardia is noted.  Breath sounds are decreased.  As noted there is a vertical well-healed operative scar in the epigastrium.  There is marked fusiform change of the knees.  Pedal pulses are  decreased.  She has 1/2+ edema at the sock line.  Strength to opposition is fair in all extremities.  General appearance: no acute distress, increased work of breathing is present.   Lymphatic: No lymphadenopathy about the head, neck, axilla. Eyes: No conjunctival inflammation or lid edema is present. There is no scleral icterus. Ears:  External ear exam shows no significant lesions or deformities.   Nose:  External nasal examination shows no deformity or inflammation. Nasal mucosa are pink and moist without lesions, exudates Neck:  No thyromegaly, masses, tenderness noted.    Heart:  No murmur, click, rub.  Lungs: without wheezes, rhonchi, rales, rubs. Abdomen: Bowel sounds are normal.  Abdomen is soft and nontender with no organomegaly, hernias, masses. GU: Deferred  Extremities:  No cyanosis, clubbing. Neurologic exam: Balance, Rhomberg, finger to nose testing could not be completed due to clinical state Skin: Warm & dry w/o tenting. No significant lesions or rash.  See clinical summary under each active problem in the Problem List with associated updated therapeutic plan

## 2021-02-03 NOTE — Assessment & Plan Note (Addendum)
She denies dysphagia but dementia invalidates her responses.Speech Therapy alerted to dramatic esophageal findings @ EGD

## 2021-02-03 NOTE — Assessment & Plan Note (Addendum)
PT/OT at SNF.  Monitor for postural hypotension. Postop follow-up in 2 weeks with Dr. [name].

## 2021-02-03 NOTE — Patient Instructions (Signed)
See assessment and plan under each diagnosis in the problem list and acutely for this visit 

## 2021-02-08 ENCOUNTER — Encounter: Payer: Self-pay | Admitting: Adult Health

## 2021-02-08 ENCOUNTER — Non-Acute Institutional Stay (SKILLED_NURSING_FACILITY): Payer: Medicare Other | Admitting: Adult Health

## 2021-02-08 DIAGNOSIS — S72142S Displaced intertrochanteric fracture of left femur, sequela: Secondary | ICD-10-CM

## 2021-02-08 DIAGNOSIS — T18128D Food in esophagus causing other injury, subsequent encounter: Secondary | ICD-10-CM

## 2021-02-08 DIAGNOSIS — N184 Chronic kidney disease, stage 4 (severe): Secondary | ICD-10-CM

## 2021-02-08 DIAGNOSIS — Z794 Long term (current) use of insulin: Secondary | ICD-10-CM

## 2021-02-08 DIAGNOSIS — I10 Essential (primary) hypertension: Secondary | ICD-10-CM

## 2021-02-08 DIAGNOSIS — E1122 Type 2 diabetes mellitus with diabetic chronic kidney disease: Secondary | ICD-10-CM

## 2021-02-08 NOTE — Progress Notes (Signed)
Location:  [place] Room Number: 510-C Place of Service:  SNF (31) Provider:  Durenda Age, DNP, FNP-BC  Patient Care Team: Ginger Organ., MD as PCP - General (Internal Medicine)  Extended Emergency Contact Information Primary Emergency Contact: [place] Phone: [phone] Mobile Phone: [phone] Relation: Daughter  Code Status: Full Code   Goals of care: Advanced Directive information Advanced Directives 02/08/2021  Does Patient Have a Medical Advance Directive? Yes  Type of Advance Directive [place]  Does patient want to make changes to medical advance directive? No - Patient declined  Copy of [place] in Chart? Yes - validated most recent copy scanned in chart (See row information)  Would patient like information on creating a medical advance directive? -     Chief Complaint  Patient presents with   Acute Visit    Short term rehabilitation     HPI:  Pt is a 87 y.o. female seen today for an acute visit. She is a short-term care rehabilitation resident of [facility]. She has a PMH of dementia, chronic kidney disease is stage IV, diabetes mellitus, hypertension, chronic diastolic heart failure and left lower extremity DVT on anticoagulation.  She was admitted to [facility] on 02/02/2019 post hospital 01/20/2021 to 02/01/2021.  She had a fall at home sustaining a left hip fracture with elevated creatinine at 3.2 from baseline of 2.1. Orthopedics was consulted and underwent IM nailing/12/22 by Dr. [name].  [facility] stay was complicated by nausea and  vomiting and was not able to put for 3 days.  CT scan of the abdomen and pelvis showed distal esophageal lumen impacted bolus.  GI was consulted and underwent removal of food bolus.  She was found to have a creatinine 3.3 on admission with baseline creatinine of 2.1.  She was given IV fluids during her hospitalization which  improved creatinine to 1.4.  She was seen in the room today. CBGs ranging from 98-224.  She takes Basaglar 100 units/mL inject 10 units at bedtime for diabetes mellitus.  SBPs ranging from 100 154.  She takes metoprolol succinate ER 25 mg twice a day and hydralazine 25 mg 3 times a day PRN.  She is currently having PT, OT and ST.  Past Medical History:  Diagnosis Date   Allergic rhinitis, cause unspecified    Asthma    Esophageal reflux    Sleep apnea    on C-pap- Dr young follows   Syncope 2019   Normal LVF, negative monitor   Type II or unspecified type diabetes mellitus without mention of complication, not stated as uncontrolled    Unspecified essential hypertension    mild LVH, normal LVF, grade 2 DD by echo 2019   Past Surgical History:  Procedure Laterality Date   APPENDECTOMY     ESOPHAGOGASTRODUODENOSCOPY (EGD) WITH PROPOFOL N/A 01/29/2021   Procedure: ESOPHAGOGASTRODUODENOSCOPY (EGD) WITH PROPOFOL;  Surgeon: Ronnette Juniper, MD;  Location: [place];  Service: Gastroenterology;  Laterality: N/A;   FIXATION KYPHOPLASTY LUMBAR SPINE     FOREIGN BODY REMOVAL  01/29/2021   Procedure: FOREIGN BODY REMOVAL;  Surgeon: Ronnette Juniper, MD;  Location: [place];  Service: Gastroenterology;;   HIATAL HERNIA REPAIR     INTRAMEDULLARY (IM) NAIL INTERTROCHANTERIC Left 01/21/2021   Procedure: INTRAMEDULLARY (IM) NAIL INTERTROCHANTRIC;  Surgeon: Leandrew Koyanagi, MD;  Location: [place];  Service: Orthopedics;  Laterality: Left;   LUMBAR SPINE SURGERY     TUBAL LIGATION  VAGINAL HYSTERECTOMY      Allergies  Allergen Reactions   Aspirin Nausea And Vomiting   Codeine Nausea And Vomiting   Ibuprofen Nausea And Vomiting   Meperidine Hcl    Tape     ADHESIVE TAPE CAUSES BLISTERS   Augmentin [Amoxicillin-Pot Clavulanate]     GI upset    Outpatient Encounter Medications as of 02/08/2021  Medication Sig   acetaminophen (TYLENOL) 500 MG tablet Take 1,000 mg by mouth every 6 (six) hours as  needed for moderate pain or headache.   albuterol (PROAIR HFA) 108 (90 Base) MCG/ACT inhaler 2 puffs every 4-6 hours as directed- rescue (Patient taking differently: Inhale 2 puffs into the lungs every 6 (six) hours as needed for wheezing or shortness of breath.)   bisacodyl (DULCOLAX) 10 MG suppository If not relieved by MOM, give 10 mg Bisacodyl suppositiory rectally X 1 dose in 24 hours as needed   diazepam (VALIUM) 5 MG tablet Take 1 tablet (5 mg total) by mouth every 12 (twelve) hours as needed for anxiety.   ELIQUIS 2.5 MG TABS tablet Take 2.5 mg by mouth 2 (two) times daily.   furosemide (LASIX) 20 MG tablet Take 20 mg by mouth daily. as needed for edema   hydrALAZINE (APRESOLINE) 25 MG tablet Take 25 mg by mouth 3 (three) times daily. as needed for SBP greater thn or equal to 140   Insulin Glargine (BASAGLAR KWIKPEN) 100 UNIT/ML Give 10 units sub q at bedtime for DM   loratadine (CLARITIN) 10 MG tablet Take 10 mg by mouth daily as needed for allergies.   metoprolol succinate (TOPROL-XL) 25 MG 24 hr tablet Take 1 tablet (25 mg total) by mouth daily. (Patient taking differently: Take 25 mg by mouth in the morning and at bedtime.)   oxyCODONE-acetaminophen (PERCOCET) 5-325 MG tablet Take 1 tablet by mouth every 8 (eight) hours as needed for severe pain.   pantoprazole (PROTONIX) 40 MG tablet Take 1 tablet (40 mg total) by mouth 2 (two) times daily before a meal for 28 days, THEN 1 tablet (40 mg total) daily for 28 days.   polyethylene glycol (MIRALAX / GLYCOLAX) 17 g packet Take 17 g by mouth daily as needed for mild constipation.   senna-docusate (SENOKOT-S) 8.6-50 MG tablet Take 1 tablet by mouth 2 (two) times daily.   simvastatin (ZOCOR) 40 MG tablet Take 40 mg by mouth every evening.   Sodium Phosphates (RA SALINE ENEMA RE) Place rectally. If not relieved by Biscodyl suppository, give disposable Saline Enema rectally X 1 dose/24 hrs as needed   albuterol (PROVENTIL) (2.5 MG/3ML) 0.083%  nebulizer solution Take 3 mLs (2.5 mg total) by nebulization every 6 (six) hours as needed for wheezing or shortness of breath. DX J45.998   BD PEN NEEDLE NANO U/F 32G X 4 MM MISC USE TO INJECT INSULIN ONCE D   Blood Glucose Monitoring Suppl (ONE TOUCH ULTRA SYSTEM KIT) W/DEVICE KIT 1 kit by Does not apply route once.   Lancets MISC by Does not apply route daily.   ONE TOUCH ULTRA TEST test strip Checks blood sugar 3 times daily   TOUJEO SOLOSTAR 300 UNIT/ML SOPN Inject 10 Units into the skin at bedtime.   [DISCONTINUED] furosemide (LASIX) 40 MG tablet Take 0.5 tablets (20 mg total) by mouth daily as needed for fluid or edema.   [DISCONTINUED] hydrALAZINE (APRESOLINE) 50 MG tablet Take 0.5 tablets (25 mg total) by mouth 3 (three) times daily as needed (SBP>160 and or DBP>100).  No facility-administered encounter medications on file as of 02/08/2021.    Review of Systems  GENERAL: No change in appetite, no fatigue, no weight changes, no fever or chills   MOUTH and THROAT: Denies oral discomfort, gingival pain or bleeding RESPIRATORY: no cough, SOB, DOE, wheezing, hemoptysis CARDIAC: No chest pain, edema or palpitations GI: No abdominal pain, diarrhea, constipation, heart burn, nausea or vomiting GU: Denies dysuria, frequency, hematuria or discharge NEUROLOGICAL: Denies dizziness, syncope, numbness, or headache PSYCHIATRIC: Denies feelings of depression or anxiety. No report of hallucinations, insomnia, paranoia, or agitation   Immunization History  Administered Date(s) Administered   Influenza Split 01/11/2011, 12/06/2011, 01/11/2012, 01/08/2013, 01/13/2014, 12/10/2017   Influenza Whole 11/22/2008, 12/13/2009   Influenza-Unspecified 01/08/2017, 01/04/2020   Janssen (J&J) SARS-COV-2 Vaccination 06/20/2019   Pneumococcal Polysaccharide-23 03/12/2008   Zoster, Live 03/13/2011   Pertinent  Health Maintenance Due  Topic Date Due   FOOT EXAM  Never done   OPHTHALMOLOGY EXAM  Never done    HEMOGLOBIN A1C  07/20/2021   INFLUENZA VACCINE  Completed   DEXA SCAN  Completed   Fall Risk 01/29/2021 01/30/2021 01/30/2021 01/31/2021 01/31/2021  Falls in the past year? - - - - -  Was there an injury with Fall? - - - - -  Fall Risk Category Calculator - - - - -  Fall Risk Category - - - - -  Patient Fall Risk Level _0      Vitals:   02/08/21 1328  BP: (!) 146/70  Pulse: 76  Resp: 18  Temp: 97.7 F (36.5 C)  Weight: 169 lb 6.4 oz (76.8 kg)  Height: 5' 3" (1.6 m)   Body mass index is 30.01 kg/m.  Physical Exam  GENERAL APPEARANCE: Well nourished. In no acute distress.  Obese. SKIN: Left hip surgical incision with staples, dry and no erythema MOUTH and THROAT: Lips are without lesions. Oral mucosa is moist and without lesions.  RESPIRATORY: Breathing is even & unlabored, BS CTAB CARDIAC: RRR, no murmur,no extra heart sounds, no edema GI: Abdomen soft, normal BS, no masses, no tenderness NEUROLOGICAL: There is no tremor. Speech is clear.  Alert to self and time, disoriented to place. PSYCHIATRIC:  Affect and behavior are appropriate  Labs reviewed: Recent Labs    06/24/20 1338 01/07/21 1217 01/27/21 0237 01/28/21 0817 01/29/21 0326 01/30/21 0311  NA 144   < > 137 138 140 139  K 4.3   < > 4.7 4.1 4.1 3.7  CL 110   < > 105 107 111 113*  CO2 26   < > 22 21* 20* 22  GLUCOSE 123*   < > 173* 163* 146* 154*  BUN 66*   < > 23 27* 25* 27*  CREATININE 2.74*   < > 1.79* 1.67* 1.57* 1.43*  CALCIUM 10.7*   < > 10.6* 10.3 9.8 9.5  MG 2.0  --  1.9 1.9  --  1.7  PHOS 3.6  --   --   --   --   --    < > = values in this interval not displayed.   Recent Labs    06/24/20 1338 01/20/21 1823  AST 27 29  ALT 28 20  ALKPHOS 68 81  BILITOT 0.6 1.4*  PROT 8.0 6.8  ALBUMIN 4.5 3.8   Recent Labs    01/28/21 0817 01/29/21 0326 01/30/21 0311  WBC 10.5 10.8* 10.9*  NEUTROABS 7.3 7.2  7.2  HGB 10.1*  10.2* 9.3*  HCT 32.4* 31.8* 29.9*  MCV 96.7 96.1 97.1  PLT 294 300 272   No results found for: TSH Lab Results  Component Value Date   HGBA1C 7.9 (H) 01/20/2021   No results found for: CHOL, HDL, LDLCALC, LDLDIRECT, TRIG, CHOLHDL  Significant Diagnostic Results in last 30 days:  CT ABDOMEN PELVIS WO CONTRAST  Result Date: 01/28/2021 CLINICAL DATA:  Nausea/vomiting Abdominal pain, acute, nonlocalized EXAM: CT ABDOMEN AND PELVIS WITHOUT CONTRAST TECHNIQUE: Multidetector CT imaging of the abdomen and pelvis was performed following the standard protocol without IV contrast. COMPARISON:  X-ray intraoperative left femur 01/21/2021 FINDINGS: Lower chest: Bilateral trace pleural effusions. Bilateral lower lobe passive atelectasis. Diffuse bronchial wall thickening. The distal esophageal lumen is dilated with impacted food bolus. Hepatobiliary: No focal liver abnormality. Nonspecific hydropic gallbladder. No gallstones, gallbladder wall thickening, or pericholecystic fluid. No biliary dilatation. Pancreas: Diffusely atrophic. No focal lesion. Otherwise normal pancreatic contour. No surrounding inflammatory changes. No main pancreatic ductal dilatation. Spleen: Punctate calcifications likely represent sequelae of prior granulomatous disease. Normal in size without focal abnormality. Adrenals/Urinary Tract: No adrenal nodule bilaterally. Bilateral perinephric stranding. No nephrolithiasis and no hydronephrosis. There is a 1.3 cm hyperdense left renal lesion with a density of 74 Hounsfield units consistent with a hemorrhagic or proteinaceous cyst. No ureterolithiasis or hydroureter. The urinary bladder is unremarkable. Stomach/Bowel: Findings suggestive fundoplication repair with markedly limited evaluation due to streak artifact from the surgical staples. Otherwise stomach is within normal limits. No evidence of bowel wall thickening or dilatation. Vascular/Lymphatic: No abdominal aorta or iliac aneurysm.  Severe atherosclerotic plaque of the aorta and its branches. No abdominal, pelvic, or inguinal lymphadenopathy. Reproductive: Status post hysterectomy. No adnexal masses. Other: No intraperitoneal free fluid. No intraperitoneal free gas. No organized fluid collection. Musculoskeletal: No abdominal wall hernia or abnormality. Diffusely decreased bone density. L3 through L5 laminectomy and posterolateral fusion. No suspicious lytic or blastic osseous lesions. No acute displaced fracture. Multilevel degenerative changes of the spine. Partially visualized intramedullary nail fixation of the proximal left femur in a patient with a known acute intratrochanteric fracture. IMPRESSION: 1. Dilatation of distal esophageal lumen with impacted food bolus in the setting of prior fundoplication/hiatal hernia repair. 2. Bilateral trace pleural effusions. 3.  Aortic Atherosclerosis (ICD10-I70.0). These results will be called to the ordering clinician or representative by the Radiologist Assistant, and communication documented in the PACS or [employer]. Electronically Signed   By: Iven Finn M.D.   On: 01/28/2021 22:15   DG Chest 1 View  Result Date: 01/20/2021 CLINICAL DATA:  Status post fall.  Hip fracture. EXAM: CHEST  1 VIEW COMPARISON:  Chest x-ray 01/07/2021 FINDINGS: The heart and mediastinal contours are unchanged. Aortic calcification. No focal consolidation. No pulmonary edema. No pleural effusion. No pneumothorax. No acute osseous abnormality. IMPRESSION: No active disease. Electronically Signed   By: Iven Finn M.D.   On: 01/20/2021 19:11   CT HEAD WO CONTRAST (5MM)  Result Date: 01/20/2021 CLINICAL DATA:  Fall, headache EXAM: CT HEAD WITHOUT CONTRAST CT CERVICAL SPINE WITHOUT CONTRAST TECHNIQUE: Multidetector CT imaging of the head and cervical spine was performed following the standard protocol without intravenous contrast. Multiplanar CT image reconstructions of the cervical spine were also  generated. COMPARISON:  None. FINDINGS: CT HEAD FINDINGS BRAIN: BRAIN Cerebral ventricle sizes are concordant with the degree of cerebral volume loss. Patchy and confluent areas of decreased attenuation are noted throughout the deep and periventricular white matter of the  cerebral hemispheres bilaterally, compatible with chronic microvascular ischemic disease. No evidence of large-territorial acute infarction. No parenchymal hemorrhage. No mass lesion. No extra-axial collection. No mass effect or midline shift. No hydrocephalus. Basilar cisterns are patent. Vascular: No hyperdense vessel. Atherosclerotic calcifications are present within the cavernous internal carotid arteries. Skull: No acute fracture or focal lesion. Sinuses/Orbits: Paranasal sinuses and mastoid air cells are clear. Bilateral lens replacement. Otherwise the orbits are unremarkable. Other: None. CT CERVICAL SPINE FINDINGS Alignment: Normal. Skull base and vertebrae: Multilevel degenerative changes of the spine. No definite severe osseous neural foraminal or central canal stenosis. No acute fracture. No aggressive appearing focal osseous lesion or focal pathologic process. Soft tissues and spinal canal: No prevertebral fluid or swelling. No visible canal hematoma. Upper chest: Unremarkable. Other: None. IMPRESSION: 1. No acute intracranial abnormality. 2. No acute displaced fracture or traumatic listhesis of the cervical spine. Electronically Signed   By: Iven Finn M.D.   On: 01/20/2021 21:10   CT Cervical Spine Wo Contrast  Result Date: 01/20/2021 CLINICAL DATA:  Fall, headache EXAM: CT HEAD WITHOUT CONTRAST CT CERVICAL SPINE WITHOUT CONTRAST TECHNIQUE: Multidetector CT imaging of the head and cervical spine was performed following the standard protocol without intravenous contrast. Multiplanar CT image reconstructions of the cervical spine were also generated. COMPARISON:  None. FINDINGS: CT HEAD FINDINGS BRAIN: BRAIN Cerebral ventricle  sizes are concordant with the degree of cerebral volume loss. Patchy and confluent areas of decreased attenuation are noted throughout the deep and periventricular white matter of the cerebral hemispheres bilaterally, compatible with chronic microvascular ischemic disease. No evidence of large-territorial acute infarction. No parenchymal hemorrhage. No mass lesion. No extra-axial collection. No mass effect or midline shift. No hydrocephalus. Basilar cisterns are patent. Vascular: No hyperdense vessel. Atherosclerotic calcifications are present within the cavernous internal carotid arteries. Skull: No acute fracture or focal lesion. Sinuses/Orbits: Paranasal sinuses and mastoid air cells are clear. Bilateral lens replacement. Otherwise the orbits are unremarkable. Other: None. CT CERVICAL SPINE FINDINGS Alignment: Normal. Skull base and vertebrae: Multilevel degenerative changes of the spine. No definite severe osseous neural foraminal or central canal stenosis. No acute fracture. No aggressive appearing focal osseous lesion or focal pathologic process. Soft tissues and spinal canal: No prevertebral fluid or swelling. No visible canal hematoma. Upper chest: Unremarkable. Other: None. IMPRESSION: 1. No acute intracranial abnormality. 2. No acute displaced fracture or traumatic listhesis of the cervical spine. Electronically Signed   By: Iven Finn M.D.   On: 01/20/2021 21:10   CT Knee Left Wo Contrast  Result Date: 01/20/2021 CLINICAL DATA:  Knee trauma, tenderness or effusion EXAM: CT OF THE  KNEE WITHOUT CONTRAST TECHNIQUE: Multidetector CT imaging of the left knee was performed according to the standard protocol. Multiplanar CT image reconstructions were also generated. COMPARISON:  X-ray left knee 01/20/2021 FINDINGS: Bones/Joint/Cartilage Moderate to severe tricompartmental degenerative changes of the knee. Question associated chondrocalcinosis. Slight medial subluxation of the femur in relation to the  patella and tibia likely due to degenerative changes. No acute displaced fracture or dislocation. Trace associated joint effusion. Ligaments Suboptimally assessed by CT. Muscles and Tendons Grossly unremarkable. Soft tissues Unremarkable.  Vascular calcifications. IMPRESSION: No acute displaced fracture or dislocation in a patient with moderate to severe tricompartmental degenerative changes. Slight medial subluxation of the femur in relation to the patella and tibia likely due to degenerative changes. Electronically Signed   By: Iven Finn M.D.   On: 01/20/2021 21:25   DG Knee Complete 4 Views Left  Result Date: 01/20/2021 CLINICAL DATA:  Fall, left knee pain EXAM: LEFT KNEE - COMPLETE 4+ VIEW COMPARISON:  None. FINDINGS: Moderate tricompartmental degenerative changes. Chondrocalcinosis in the medial and lateral compartments. No fracture or dislocation is seen. Visualized soft tissues are within normal limits. No suprapatellar knee joint effusion. IMPRESSION: Moderate degenerative changes, as above. Electronically Signed   By: Julian Hy M.D.   On: 01/20/2021 19:22   DG Abd 2 Views  Result Date: 01/26/2021 CLINICAL DATA:  Emesis EXAM: ABDOMEN - 2 VIEW COMPARISON:  07/25/2009 FINDINGS: Multiple clips in the region of the stomach. Normal bowel gas pattern. No obstruction or free air. ORIF left hip. Pedicle screw and interbody fusion L3-4. Pedicle screws in L5 not connected to L4. IMPRESSION: Normal bowel gas pattern. Electronically Signed   By: Franchot Gallo M.D.   On: 01/26/2021 13:27   DG C-Arm 1-60 Min-No Report  Result Date: 01/21/2021 Fluoroscopy was utilized by the requesting physician.  No radiographic interpretation.   DG Hip Unilat With Pelvis 2-3 Views Left  Result Date: 01/20/2021 CLINICAL DATA:  Fall, left hip pain EXAM: DG HIP (WITH OR WITHOUT PELVIS) 2-3V LEFT COMPARISON:  None. FINDINGS: Intertrochanteric left hip fracture, mildly displaced. Right hip is intact.  Visualized bony pelvis appears intact. Lumbar spine fixation hardware, incompletely visualized. IMPRESSION: Intertrochanteric left hip fracture, mildly displaced. Electronically Signed   By: Julian Hy M.D.   On: 01/20/2021 19:22   DG FEMUR MIN 2 VIEWS LEFT  Result Date: 01/21/2021 CLINICAL DATA:  Surgery, elective Z41.9 (ICD-10-CM) EXAM: LEFT FEMUR 2 VIEWS COMPARISON:  January 20, 2021. FINDINGS: Fluoro time: 1 minute 28 seconds. Reported radiation dose: 6.82 mGy. Five C-arm fluoroscopic images were obtained intraoperatively and submitted for post operative interpretation. These images demonstrate intramedullary rod and screw fixation a intertrochanteric left femur fracture. Improved alignment, near anatomic. Please see the performing provider's procedural report for further detail. IMPRESSION: Intraoperative fluoroscopy, as detailed above. Electronically Signed   By: Margaretha Sheffield M.D.   On: 01/21/2021 10:28    Assessment/Plan  1. Type 2 diabetes mellitus with stage 4 chronic kidney disease, with long-term current use of insulin (HCC) Lab Results  Component Value Date   HGBA1C 7.9 (H) 01/20/2021   -    CBGs is stable -    Continue Basaglar 100 units / 10 units at bedtime -    Monitor CBGs  2. Essential hypertension -  BPs stable, continue metoprolol succinate ER 25 mg twice a day and hydralazine 25 mg 3 times a day PRN  3. Closed intertrochanteric fracture of hip, left, sequela -   S/P IM nailing on 01/21/2021 -    Follow-up with orthopedics -    Continue Percocet 5-325 mg 1 tab every 8 hours PRN for pain -   WBAT -    Continue PT and OT, for therapeutic and strengthening exercises  4. Food impaction of esophagus, subsequent encounter -   No nausea or vomiting, able to tolerate food -    Continue ST for safe swallowing  -    Continue Protonix 40 mg twice a day    Family/ staff Communication:   Discussed plan of care with resident and charge nurse.  Labs/tests  ordered:   None  Goals of care:   Short-term care   Durenda Age, DNP, MSN, FNP-BC [facility] [phone] (Monday-Friday 8:00 a.m. - 5:00 p.m.) [phone] (after hours)

## 2021-02-14 ENCOUNTER — Encounter: Payer: Self-pay | Admitting: Orthopaedic Surgery

## 2021-02-14 ENCOUNTER — Other Ambulatory Visit: Payer: Self-pay

## 2021-02-14 ENCOUNTER — Ambulatory Visit (INDEPENDENT_AMBULATORY_CARE_PROVIDER_SITE_OTHER): Payer: Medicare Other | Admitting: Orthopaedic Surgery

## 2021-02-14 ENCOUNTER — Ambulatory Visit (INDEPENDENT_AMBULATORY_CARE_PROVIDER_SITE_OTHER): Payer: Medicare Other

## 2021-02-14 DIAGNOSIS — M25552 Pain in left hip: Secondary | ICD-10-CM

## 2021-02-14 NOTE — Progress Notes (Signed)
Post-Op Visit Note   Patient: [name] [name]           Date of Birth: 19-Nov-1929           MRN: [id number] Visit Date: 02/14/2021 PCP: Ginger Organ., MD   Assessment & Plan:  Chief Complaint:  Chief Complaint  Patient presents with   Left Hip - Follow-up    IM nail 01/21/2021   Visit Diagnoses:  1. Pain in left hip     Plan: Patient is a pleasant 87 year old female who is here today with her son.  She is 2 weeks status post left hip IM nail from an intertrochanteric femur fracture, date of surgery 01/21/2021.  She has been doing well.  She is currently at [facility] and getting therapy.  Of note, she was ambulating with a cane and walker prior to her hip fracture.  She has a wheelchair right now.  Examination left hip reveals a well-healed surgical incision without complication.  Calf soft nontender.  She is neurovascular tact distally.  Today, staples were removed and Steri-Strips applied.  She will continue weightbearing as tolerated.  Continue physical therapy.  Follow-up with [place] in 4 weeks time for repeat evaluation and x-rays of the left femur.  Call with concerns or questions in the meantime.  Follow-Up Instructions: Return in about 4 weeks (around 03/14/2021).   Orders:  Orders Placed This Encounter  Procedures   XR HIP UNILAT W OR W/O PELVIS 2-3 VIEWS LEFT   No orders of the defined types were placed in this encounter.   Imaging: No results found.  PMFS History: Patient Active Problem List   Diagnosis Date Noted   Food impaction of esophagus 01/29/2021   CKD (chronic kidney disease) stage 4, GFR 15-29 ml/min (HCC) 01/20/2021   Closed intertrochanteric fracture of hip, left, sequela 01/20/2021   Alzheimer's dementia ([place]) 01/20/2021   Renal insufficiency 06/18/2019   Sleep apnea    Essential hypertension    Hyperlipidemia 08/28/2017   History of syncope 08/28/2017   Acute frontal sinusitis 09/20/2013   Obstructive sleep apnea 02/08/2011   RENAL  INSUFFICIENCY 09/29/2007   DM2 (diabetes mellitus, type 2) ([place]) 03/31/2007   OBESITY 03/31/2007   Venous insufficiency (chronic) (peripheral) 03/31/2007   Asthmatic bronchitis , chronic (HCC) 03/31/2007   Seasonal and perennial allergic rhinitis 03/31/2007   Asthma, mild persistent 03/31/2007   G E R D 03/31/2007   Past Medical History:  Diagnosis Date   Allergic rhinitis, cause unspecified    Asthma    Esophageal reflux    Sleep apnea    on C-pap- Dr young follows   Syncope 2019   Normal LVF, negative monitor   Type II or unspecified type diabetes mellitus without mention of complication, not stated as uncontrolled    Unspecified essential hypertension    mild LVH, normal LVF, grade 2 DD by echo 2019    Family History  Problem Relation Age of Onset   Lung cancer Brother    Diabetes Mother     Past Surgical History:  Procedure Laterality Date   APPENDECTOMY     ESOPHAGOGASTRODUODENOSCOPY (EGD) WITH PROPOFOL N/A 01/29/2021   Procedure: ESOPHAGOGASTRODUODENOSCOPY (EGD) WITH PROPOFOL;  Surgeon: Ronnette Juniper, MD;  Location: [place];  Service: Gastroenterology;  Laterality: N/A;   FIXATION KYPHOPLASTY LUMBAR SPINE     FOREIGN BODY REMOVAL  01/29/2021   Procedure: FOREIGN BODY REMOVAL;  Surgeon: Ronnette Juniper, MD;  Location: [place];  Service: Gastroenterology;;   HIATAL  HERNIA REPAIR     INTRAMEDULLARY (IM) NAIL INTERTROCHANTERIC Left 01/21/2021   Procedure: INTRAMEDULLARY (IM) NAIL INTERTROCHANTRIC;  Surgeon: Leandrew Koyanagi, MD;  Location: [place];  Service: Orthopedics;  Laterality: Left;   LUMBAR SPINE SURGERY     TUBAL LIGATION     VAGINAL HYSTERECTOMY     Social History   Occupational History    Comment: [employer]  Tobacco Use   Smoking status: Never   Smokeless tobacco: Never  Vaping Use   Vaping Use: Never used  Substance and Sexual Activity   Alcohol use: Never   Drug use: Never   Sexual activity: Not Currently

## 2021-02-15 ENCOUNTER — Encounter: Payer: Self-pay | Admitting: Adult Health

## 2021-02-15 ENCOUNTER — Non-Acute Institutional Stay (SKILLED_NURSING_FACILITY): Payer: Medicare Other | Admitting: Adult Health

## 2021-02-15 DIAGNOSIS — R630 Anorexia: Secondary | ICD-10-CM

## 2021-02-15 DIAGNOSIS — I10 Essential (primary) hypertension: Secondary | ICD-10-CM

## 2021-02-15 NOTE — Progress Notes (Signed)
Location:  [place] Room Number: 237-S Place of Service:  SNF (31) Provider:  Durenda Age, DNP, FNP-BC  Patient Care Team: Ginger Organ., MD as PCP - General (Internal Medicine)  Extended Emergency Contact Information Primary Emergency Contact: [place] Phone: [phone] Mobile Phone: [phone] Relation: Daughter  Code Status:  FULL CODE  Goals of care: Advanced Directive information Advanced Directives 02/15/2021  Does Patient Have a Medical Advance Directive? No  Type of Advance Directive -  Does patient want to make changes to medical advance directive? No - Patient declined  Copy of [place] in Chart? -  Would patient like information on creating a medical advance directive? -     Chief Complaint  Patient presents with   Acute Visit    Poor appetite and elevated blood pressures.     HPI:  Pt is a 87 y.o. female seen today for an acute visit. She is a short-term care resident of [facility]. She was reported by staff to have poor appetite. BP today was 174/91. SBPs ranging from 134 to 174. She takes metoprolol succinate ER 25 mg twice a day and hydralazine 25 mg 1 3 times a day PRN. She is currently having PT and OT.  She had a fall at home for which she had a left hip fracture and had IM nailing on 12/22.  She was hospitalized 01/20/2021 to 02/01/2021.   Past Medical History:  Diagnosis Date   Allergic rhinitis, cause unspecified    Asthma    Esophageal reflux    Sleep apnea    on C-pap- Dr young follows   Syncope 2019   Normal LVF, negative monitor   Type II or unspecified type diabetes mellitus without mention of complication, not stated as uncontrolled    Unspecified essential hypertension    mild LVH, normal LVF, grade 2 DD by echo 2019   Past Surgical History:  Procedure Laterality Date   APPENDECTOMY     ESOPHAGOGASTRODUODENOSCOPY (EGD) WITH PROPOFOL N/A  01/29/2021   Procedure: ESOPHAGOGASTRODUODENOSCOPY (EGD) WITH PROPOFOL;  Surgeon: Ronnette Juniper, MD;  Location: [facility];  Service: Gastroenterology;  Laterality: N/A;   FIXATION KYPHOPLASTY LUMBAR SPINE     FOREIGN BODY REMOVAL  01/29/2021   Procedure: FOREIGN BODY REMOVAL;  Surgeon: Ronnette Juniper, MD;  Location: [place];  Service: Gastroenterology;;   HIATAL HERNIA REPAIR     INTRAMEDULLARY (IM) NAIL INTERTROCHANTERIC Left 01/21/2021   Procedure: INTRAMEDULLARY (IM) NAIL INTERTROCHANTRIC;  Surgeon: Leandrew Koyanagi, MD;  Location: [facility];  Service: Orthopedics;  Laterality: Left;   LUMBAR SPINE SURGERY     TUBAL LIGATION     VAGINAL HYSTERECTOMY      Allergies  Allergen Reactions   Aspirin Nausea And Vomiting   Codeine Nausea And Vomiting   Ibuprofen Nausea And Vomiting   Meperidine Hcl    Tape     ADHESIVE TAPE CAUSES BLISTERS   Augmentin [Amoxicillin-Pot Clavulanate]     GI upset    Outpatient Encounter Medications as of 02/15/2021  Medication Sig   albuterol (PROAIR HFA) 108 (90 Base) MCG/ACT inhaler 2 puffs every 4-6 hours as directed- rescue   bisacodyl (DULCOLAX) 10 MG suppository If not relieved by MOM, give 10 mg Bisacodyl suppositiory rectally X 1 dose in 24 hours as needed   diazepam (VALIUM) 5 MG tablet Take 1 tablet (5 mg total) by mouth every 12 (twelve) hours as needed for anxiety.   ELIQUIS 2.5  MG TABS tablet Take 2.5 mg by mouth 2 (two) times daily.   furosemide (LASIX) 20 MG tablet Take 20 mg by mouth daily. as needed for edema   hydrALAZINE (APRESOLINE) 25 MG tablet Take 25 mg by mouth 3 (three) times daily. as needed for SBP greater thn or equal to 140   Insulin Glargine (BASAGLAR KWIKPEN) 100 UNIT/ML Give 10 units sub q at bedtime for DM   loratadine (CLARITIN) 10 MG tablet Take 10 mg by mouth daily as needed for allergies.   Magnesium Hydroxide (MILK OF MAGNESIA PO) Take 30 mLs by mouth as needed.   metoprolol succinate (TOPROL-XL) 25 MG 24 hr tablet Take 25  mg by mouth in the morning and at bedtime.   Nutritional Supplements (NUTRITIONAL SUPPLEMENT PO) Take by mouth daily. Magic Cup.   oxyCODONE-acetaminophen (PERCOCET) 5-325 MG tablet Take 1 tablet by mouth every 8 (eight) hours as needed for severe pain.   pantoprazole (PROTONIX) 40 MG tablet Take 1 tablet (40 mg total) by mouth 2 (two) times daily before a meal for 28 days, THEN 1 tablet (40 mg total) daily for 28 days.   polyethylene glycol (MIRALAX / GLYCOLAX) 17 g packet Take 17 g by mouth daily as needed for mild constipation.   senna-docusate (SENOKOT-S) 8.6-50 MG tablet Take 1 tablet by mouth 2 (two) times daily.   simvastatin (ZOCOR) 40 MG tablet Take 40 mg by mouth every evening.   Sodium Phosphates (RA SALINE ENEMA RE) Place rectally. If not relieved by Biscodyl suppository, give disposable Saline Enema rectally X 1 dose/24 hrs as needed   acetaminophen (TYLENOL) 500 MG tablet Take 1,000 mg by mouth every 6 (six) hours as needed for moderate pain or headache.   [DISCONTINUED] albuterol (PROVENTIL) (2.5 MG/3ML) 0.083% nebulizer solution Take 3 mLs (2.5 mg total) by nebulization every 6 (six) hours as needed for wheezing or shortness of breath. DX J45.998   [DISCONTINUED] BD PEN NEEDLE NANO U/F 32G X 4 MM MISC USE TO INJECT INSULIN ONCE D   [DISCONTINUED] Blood Glucose Monitoring Suppl (ONE TOUCH ULTRA SYSTEM KIT) W/DEVICE KIT 1 kit by Does not apply route once.   [DISCONTINUED] Lancets MISC by Does not apply route daily.   [DISCONTINUED] metoprolol succinate (TOPROL-XL) 25 MG 24 hr tablet Take 1 tablet (25 mg total) by mouth daily. (Patient taking differently: Take 25 mg by mouth in the morning and at bedtime.)   [DISCONTINUED] ONE TOUCH ULTRA TEST test strip Checks blood sugar 3 times daily   [DISCONTINUED] TOUJEO SOLOSTAR 300 UNIT/ML SOPN Inject 10 Units into the skin at bedtime.   No facility-administered encounter medications on file as of 02/15/2021.    Review of Systems  GENERAL:  No change in appetite, no fatigue, no weight changes, no fever or chills  MOUTH and THROAT: Denies oral discomfort, gingival pain or bleeding RESPIRATORY: no cough, SOB, DOE, wheezing, hemoptysis CARDIAC: No chest pain, edema or palpitations GI: No abdominal pain, diarrhea, constipation, heart burn, nausea or vomiting GU: Denies dysuria, frequency, hematuria or discharge NEUROLOGICAL: Denies dizziness, syncope, numbness, or headache PSYCHIATRIC: Denies feelings of depression or anxiety. No report of hallucinations, insomnia, paranoia, or agitation   Immunization History  Administered Date(s) Administered   Influenza Split 03/29/2009, 12/13/2009, 01/11/2011, 12/06/2011, 01/11/2012, 01/08/2013, 03/12/2013, 01/13/2014, 12/10/2017   Influenza Whole 11/22/2008, 12/13/2009   Influenza, High Dose Seasonal PF 11/30/2015   Influenza, Quadrivalent, Recombinant, Inj, Pf 12/16/2017, 12/31/2018, 01/05/2020, 12/09/2020   Influenza,inj,Quad PF,6+ Mos 01/13/2014, 12/15/2014   Influenza-Unspecified 01/08/2017,  01/04/2020   Janssen (J&J) SARS-COV-2 Vaccination 06/20/2019   Pneumococcal Conjugate-13 02/04/2013   Pneumococcal Polysaccharide-23 03/12/2008   Pneumococcal-Unspecified 03/29/2009, 03/30/2010   Tdap 11/02/2003, 03/29/2009, 06/29/2009   Zoster, Live 03/29/2009, 03/30/2010, 03/13/2011, 05/30/2011   Pertinent  Health Maintenance Due  Topic Date Due   FOOT EXAM  Never done   OPHTHALMOLOGY EXAM  Never done   HEMOGLOBIN A1C  07/20/2021   INFLUENZA VACCINE  Completed   DEXA SCAN  Completed   Fall Risk 01/29/2021 01/30/2021 01/30/2021 01/31/2021 01/31/2021  Falls in the past year? - - - - -  Was there an injury with Fall? - - - - -  Fall Risk Category Calculator - - - - -  Fall Risk Category - - - - -  Patient Fall Risk Level _0      Vitals:   02/15/21 1523  BP: (!) 174/91  Pulse: 82  Resp: 18  Temp: (!) 97.2 F  (36.2 C)  Weight: 154 lb 3.2 oz (69.9 kg)  Height: _1  (1.6 m)   Body mass index is 27.32 kg/m.  Physical Exam  GENERAL APPEARANCE: Well nourished. In no acute distress. Normal body habitus SKIN:  Skin is warm and dry.  MOUTH and THROAT: Lips are without lesions. Oral mucosa is moist and without lesions.  RESPIRATORY: Breathing is even & unlabored, BS CTAB CARDIAC: RRR, no murmur,no extra heart sounds, no edema GI: Abdomen soft, normal BS, no masses, no tenderness NEUROLOGICAL: There is no tremor. Speech is clear. Alert to self, disoriented to time and place. PSYCHIATRIC:  Affect and behavior are appropriate  Labs reviewed: Recent Labs    06/24/20 1338 01/07/21 1217 01/27/21 0237 01/28/21 0817 01/29/21 0326 01/30/21 0311  NA 144   < > 137 138 140 139  K 4.3   < > 4.7 4.1 4.1 3.7  CL 110   < > 105 107 111 113*  CO2 26   < > 22 21* 20* 22  GLUCOSE 123*   < > 173* 163* 146* 154*  BUN 66*   < > 23 27* 25* 27*  CREATININE 2.74*   < > 1.79* 1.67* 1.57* 1.43*  CALCIUM 10.7*   < > 10.6* 10.3 9.8 9.5  MG 2.0  --  1.9 1.9  --  1.7  PHOS 3.6  --   --   --   --   --    < > = values in this interval not displayed.   Recent Labs    06/24/20 1338 01/20/21 1823  AST 27 29  ALT 28 20  ALKPHOS 68 81  BILITOT 0.6 1.4*  PROT 8.0 6.8  ALBUMIN 4.5 3.8   Recent Labs    01/28/21 0817 01/29/21 0326 01/30/21 0311  WBC 10.5 10.8* 10.9*  NEUTROABS 7.3 7.2 7.2  HGB 10.1* 10.2* 9.3*  HCT 32.4* 31.8* 29.9*  MCV 96.7 96.1 97.1  PLT 294 300 272   No results found for: TSH Lab Results  Component Value Date   HGBA1C 7.9 (H) 01/20/2021   No results found for: CHOL, HDL, LDLCALC, LDLDIRECT, TRIG, CHOLHDL  Significant Diagnostic Results in last 30 days:  CT ABDOMEN PELVIS WO CONTRAST  Result Date: 01/28/2021 CLINICAL DATA:  Nausea/vomiting Abdominal pain, acute, nonlocalized EXAM: CT ABDOMEN AND PELVIS WITHOUT CONTRAST TECHNIQUE: Multidetector CT imaging of the abdomen and  pelvis was performed following the standard protocol without IV contrast. COMPARISON:  X-ray  intraoperative left femur 01/21/2021 FINDINGS: Lower chest: Bilateral trace pleural effusions. Bilateral lower lobe passive atelectasis. Diffuse bronchial wall thickening. The distal esophageal lumen is dilated with impacted food bolus. Hepatobiliary: No focal liver abnormality. Nonspecific hydropic gallbladder. No gallstones, gallbladder wall thickening, or pericholecystic fluid. No biliary dilatation. Pancreas: Diffusely atrophic. No focal lesion. Otherwise normal pancreatic contour. No surrounding inflammatory changes. No main pancreatic ductal dilatation. Spleen: Punctate calcifications likely represent sequelae of prior granulomatous disease. Normal in size without focal abnormality. Adrenals/Urinary Tract: No adrenal nodule bilaterally. Bilateral perinephric stranding. No nephrolithiasis and no hydronephrosis. There is a 1.3 cm hyperdense left renal lesion with a density of 74 Hounsfield units consistent with a hemorrhagic or proteinaceous cyst. No ureterolithiasis or hydroureter. The urinary bladder is unremarkable. Stomach/Bowel: Findings suggestive fundoplication repair with markedly limited evaluation due to streak artifact from the surgical staples. Otherwise stomach is within normal limits. No evidence of bowel wall thickening or dilatation. Vascular/Lymphatic: No abdominal aorta or iliac aneurysm. Severe atherosclerotic plaque of the aorta and its branches. No abdominal, pelvic, or inguinal lymphadenopathy. Reproductive: Status post hysterectomy. No adnexal masses. Other: No intraperitoneal free fluid. No intraperitoneal free gas. No organized fluid collection. Musculoskeletal: No abdominal wall hernia or abnormality. Diffusely decreased bone density. L3 through L5 laminectomy and posterolateral fusion. No suspicious lytic or blastic osseous lesions. No acute displaced fracture. Multilevel degenerative changes  of the spine. Partially visualized intramedullary nail fixation of the proximal left femur in a patient with a known acute intratrochanteric fracture. IMPRESSION: 1. Dilatation of distal esophageal lumen with impacted food bolus in the setting of prior fundoplication/hiatal hernia repair. 2. Bilateral trace pleural effusions. 3.  Aortic Atherosclerosis (ICD10-I70.0). These results will be called to the ordering clinician or representative by the Radiologist Assistant, and communication documented in the PACS or [employer]. Electronically Signed   By: Iven Finn M.D.   On: 01/28/2021 22:15   DG Chest 1 View  Result Date: 01/20/2021 CLINICAL DATA:  Status post fall.  Hip fracture. EXAM: CHEST  1 VIEW COMPARISON:  Chest x-ray 01/07/2021 FINDINGS: The heart and mediastinal contours are unchanged. Aortic calcification. No focal consolidation. No pulmonary edema. No pleural effusion. No pneumothorax. No acute osseous abnormality. IMPRESSION: No active disease. Electronically Signed   By: Iven Finn M.D.   On: 01/20/2021 19:11   CT HEAD WO CONTRAST (5MM)  Result Date: 01/20/2021 CLINICAL DATA:  Fall, headache EXAM: CT HEAD WITHOUT CONTRAST CT CERVICAL SPINE WITHOUT CONTRAST TECHNIQUE: Multidetector CT imaging of the head and cervical spine was performed following the standard protocol without intravenous contrast. Multiplanar CT image reconstructions of the cervical spine were also generated. COMPARISON:  None. FINDINGS: CT HEAD FINDINGS BRAIN: BRAIN Cerebral ventricle sizes are concordant with the degree of cerebral volume loss. Patchy and confluent areas of decreased attenuation are noted throughout the deep and periventricular white matter of the cerebral hemispheres bilaterally, compatible with chronic microvascular ischemic disease. No evidence of large-territorial acute infarction. No parenchymal hemorrhage. No mass lesion. No extra-axial collection. No mass effect or midline shift. No  hydrocephalus. Basilar cisterns are patent. Vascular: No hyperdense vessel. Atherosclerotic calcifications are present within the cavernous internal carotid arteries. Skull: No acute fracture or focal lesion. Sinuses/Orbits: Paranasal sinuses and mastoid air cells are clear. Bilateral lens replacement. Otherwise the orbits are unremarkable. Other: None. CT CERVICAL SPINE FINDINGS Alignment: Normal. Skull base and vertebrae: Multilevel degenerative changes of the spine. No definite severe osseous neural foraminal or central canal stenosis. No acute fracture. No aggressive  appearing focal osseous lesion or focal pathologic process. Soft tissues and spinal canal: No prevertebral fluid or swelling. No visible canal hematoma. Upper chest: Unremarkable. Other: None. IMPRESSION: 1. No acute intracranial abnormality. 2. No acute displaced fracture or traumatic listhesis of the cervical spine. Electronically Signed   By: Iven Finn M.D.   On: 01/20/2021 21:10   CT Cervical Spine Wo Contrast  Result Date: 01/20/2021 CLINICAL DATA:  Fall, headache EXAM: CT HEAD WITHOUT CONTRAST CT CERVICAL SPINE WITHOUT CONTRAST TECHNIQUE: Multidetector CT imaging of the head and cervical spine was performed following the standard protocol without intravenous contrast. Multiplanar CT image reconstructions of the cervical spine were also generated. COMPARISON:  None. FINDINGS: CT HEAD FINDINGS BRAIN: BRAIN Cerebral ventricle sizes are concordant with the degree of cerebral volume loss. Patchy and confluent areas of decreased attenuation are noted throughout the deep and periventricular white matter of the cerebral hemispheres bilaterally, compatible with chronic microvascular ischemic disease. No evidence of large-territorial acute infarction. No parenchymal hemorrhage. No mass lesion. No extra-axial collection. No mass effect or midline shift. No hydrocephalus. Basilar cisterns are patent. Vascular: No hyperdense vessel.  Atherosclerotic calcifications are present within the cavernous internal carotid arteries. Skull: No acute fracture or focal lesion. Sinuses/Orbits: Paranasal sinuses and mastoid air cells are clear. Bilateral lens replacement. Otherwise the orbits are unremarkable. Other: None. CT CERVICAL SPINE FINDINGS Alignment: Normal. Skull base and vertebrae: Multilevel degenerative changes of the spine. No definite severe osseous neural foraminal or central canal stenosis. No acute fracture. No aggressive appearing focal osseous lesion or focal pathologic process. Soft tissues and spinal canal: No prevertebral fluid or swelling. No visible canal hematoma. Upper chest: Unremarkable. Other: None. IMPRESSION: 1. No acute intracranial abnormality. 2. No acute displaced fracture or traumatic listhesis of the cervical spine. Electronically Signed   By: Iven Finn M.D.   On: 01/20/2021 21:10   CT Knee Left Wo Contrast  Result Date: 01/20/2021 CLINICAL DATA:  Knee trauma, tenderness or effusion EXAM: CT OF THE  KNEE WITHOUT CONTRAST TECHNIQUE: Multidetector CT imaging of the left knee was performed according to the standard protocol. Multiplanar CT image reconstructions were also generated. COMPARISON:  X-ray left knee 01/20/2021 FINDINGS: Bones/Joint/Cartilage Moderate to severe tricompartmental degenerative changes of the knee. Question associated chondrocalcinosis. Slight medial subluxation of the femur in relation to the patella and tibia likely due to degenerative changes. No acute displaced fracture or dislocation. Trace associated joint effusion. Ligaments Suboptimally assessed by CT. Muscles and Tendons Grossly unremarkable. Soft tissues Unremarkable.  Vascular calcifications. IMPRESSION: No acute displaced fracture or dislocation in a patient with moderate to severe tricompartmental degenerative changes. Slight medial subluxation of the femur in relation to the patella and tibia likely due to degenerative changes.  Electronically Signed   By: Iven Finn M.D.   On: 01/20/2021 21:25   DG Knee Complete 4 Views Left  Result Date: 01/20/2021 CLINICAL DATA:  Fall, left knee pain EXAM: LEFT KNEE - COMPLETE 4+ VIEW COMPARISON:  None. FINDINGS: Moderate tricompartmental degenerative changes. Chondrocalcinosis in the medial and lateral compartments. No fracture or dislocation is seen. Visualized soft tissues are within normal limits. No suprapatellar knee joint effusion. IMPRESSION: Moderate degenerative changes, as above. Electronically Signed   By: Julian Hy M.D.   On: 01/20/2021 19:22   DG Abd 2 Views  Result Date: 01/26/2021 CLINICAL DATA:  Emesis EXAM: ABDOMEN - 2 VIEW COMPARISON:  07/25/2009 FINDINGS: Multiple clips in the region of the stomach. Normal bowel gas pattern. No obstruction  or free air. ORIF left hip. Pedicle screw and interbody fusion L3-4. Pedicle screws in L5 not connected to L4. IMPRESSION: Normal bowel gas pattern. Electronically Signed   By: Franchot Gallo M.D.   On: 01/26/2021 13:27   DG C-Arm 1-60 Min-No Report  Result Date: 01/21/2021 Fluoroscopy was utilized by the requesting physician.  No radiographic interpretation.   DG Hip Unilat With Pelvis 2-3 Views Left  Result Date: 01/20/2021 CLINICAL DATA:  Fall, left hip pain EXAM: DG HIP (WITH OR WITHOUT PELVIS) 2-3V LEFT COMPARISON:  None. FINDINGS: Intertrochanteric left hip fracture, mildly displaced. Right hip is intact. Visualized bony pelvis appears intact. Lumbar spine fixation hardware, incompletely visualized. IMPRESSION: Intertrochanteric left hip fracture, mildly displaced. Electronically Signed   By: Julian Hy M.D.   On: 01/20/2021 19:22   DG FEMUR MIN 2 VIEWS LEFT  Result Date: 01/21/2021 CLINICAL DATA:  Surgery, elective Z41.9 (ICD-10-CM) EXAM: LEFT FEMUR 2 VIEWS COMPARISON:  January 20, 2021. FINDINGS: Fluoro time: 1 minute 28 seconds. Reported radiation dose: 6.82 mGy. Five C-arm fluoroscopic images  were obtained intraoperatively and submitted for post operative interpretation. These images demonstrate intramedullary rod and screw fixation a intertrochanteric left femur fracture. Improved alignment, near anatomic. Please see the performing provider's procedural report for further detail. IMPRESSION: Intraoperative fluoroscopy, as detailed above. Electronically Signed   By: Margaretha Sheffield M.D.   On: 01/21/2021 10:28   XR HIP UNILAT W OR W/O PELVIS 2-3 VIEWS LEFT  Result Date: 02/14/2021 X-rays demonstrate stable alignment of the fracture without hardware complication   Assessment/Plan  1. Uncontrolled hypertension -  will change hydralazine 25 mg 1 tab TID PRN to TID routinely -  continue metoprolol succinate ER 25 mg 1 tab twice a day -    Monitor BPs  2. Poor appetite -  will start on Remeron 7.5 mg at bedtime     Family/ staff Communication:   Discussed plan of care with resident and charge nurse.  Labs/tests ordered:   BMP with GFR, CBC on 02/16/2021  Goals of care:   Short-term care   Durenda Age, DNP, MSN, FNP-BC [facility] [phone] (Monday-Friday 8:00 a.m. - 5:00 p.m.) [phone] (after hours)

## 2021-02-17 ENCOUNTER — Other Ambulatory Visit: Payer: Self-pay | Admitting: Adult Health

## 2021-02-17 ENCOUNTER — Encounter: Payer: Self-pay | Admitting: Adult Health

## 2021-02-17 ENCOUNTER — Non-Acute Institutional Stay (SKILLED_NURSING_FACILITY): Payer: Medicare Other | Admitting: Adult Health

## 2021-02-17 DIAGNOSIS — I10 Essential (primary) hypertension: Secondary | ICD-10-CM

## 2021-02-17 DIAGNOSIS — S72142S Displaced intertrochanteric fracture of left femur, sequela: Secondary | ICD-10-CM | POA: Diagnosis not present

## 2021-02-17 DIAGNOSIS — W44F3XD Food entering into or through a natural orifice, subsequent encounter: Secondary | ICD-10-CM

## 2021-02-17 DIAGNOSIS — T18128D Food in esophagus causing other injury, subsequent encounter: Secondary | ICD-10-CM | POA: Diagnosis not present

## 2021-02-17 DIAGNOSIS — N184 Chronic kidney disease, stage 4 (severe): Secondary | ICD-10-CM

## 2021-02-17 DIAGNOSIS — E1122 Type 2 diabetes mellitus with diabetic chronic kidney disease: Secondary | ICD-10-CM

## 2021-02-17 DIAGNOSIS — R630 Anorexia: Secondary | ICD-10-CM | POA: Diagnosis not present

## 2021-02-17 DIAGNOSIS — Z86718 Personal history of other venous thrombosis and embolism: Secondary | ICD-10-CM

## 2021-02-17 DIAGNOSIS — Z794 Long term (current) use of insulin: Secondary | ICD-10-CM

## 2021-02-17 MED ORDER — BASAGLAR KWIKPEN 100 UNIT/ML ~~LOC~~ SOPN: 10.0000 [IU] | PEN_INJECTOR | Freq: Every day | SUBCUTANEOUS | 0 refills | Status: DC

## 2021-02-17 MED ORDER — HYDRALAZINE HCL 25 MG PO TABS: 25.0000 mg | ORAL_TABLET | Freq: Three times a day (TID) | ORAL | 0 refills | Status: DC

## 2021-02-17 MED ORDER — FUROSEMIDE 20 MG PO TABS: 20.0000 mg | ORAL_TABLET | Freq: Every day | ORAL | 0 refills | Status: DC

## 2021-02-17 MED ORDER — MIRTAZAPINE 7.5 MG PO TABS
7.5000 mg | ORAL_TABLET | Freq: Every day | ORAL | 0 refills | Status: DC
Start: 2021-02-17 — End: 2022-02-12

## 2021-02-17 MED ORDER — OXYCODONE-ACETAMINOPHEN 5-325 MG PO TABS: 1.0000 | ORAL_TABLET | Freq: Three times a day (TID) | ORAL | 0 refills | Status: DC | PRN

## 2021-02-17 MED ORDER — ELIQUIS 2.5 MG PO TABS: 2.5000 mg | ORAL_TABLET | Freq: Two times a day (BID) | ORAL | 0 refills | Status: AC

## 2021-02-17 MED ORDER — PROTONIX 40 MG PO TBEC: DELAYED_RELEASE_TABLET | ORAL | 0 refills | Status: DC

## 2021-02-17 MED ORDER — ALBUTEROL SULFATE HFA 108 (90 BASE) MCG/ACT IN AERS: INHALATION_SPRAY | RESPIRATORY_TRACT | 99 refills | Status: AC

## 2021-02-17 MED ORDER — METOPROLOL SUCCINATE ER 25 MG PO TB24: 25.0000 mg | ORAL_TABLET | Freq: Two times a day (BID) | ORAL | 0 refills | Status: DC

## 2021-02-17 MED ORDER — SIMVASTATIN 40 MG PO TABS: 40.0000 mg | ORAL_TABLET | Freq: Every evening | ORAL | 0 refills | Status: AC

## 2021-02-17 NOTE — Progress Notes (Signed)
Location:  [facility] Room Number: 762 UQJFH of Service:  SNF (31) Provider:  Durenda Age, DNP, FNP-BC  Patient Care Team: Ginger Organ., MD as PCP - General (Internal Medicine)  Extended Emergency Contact Information Primary Emergency Contact: [place] Phone: [phone] Mobile Phone: [phone] Relation: Daughter  Code Status:  FULL CODE  Goals of care: Advanced Directive information Advanced Directives 02/18/2021  Does Patient Have a Medical Advance Directive? -  Type of Advance Directive [place]  Does patient want to make changes to medical advance directive? No - Patient declined  Copy of [place] in Chart? Yes - validated most recent copy scanned in chart (See row information)  Would patient like information on creating a medical advance directive? No - Patient declined     Chief Complaint  Patient presents with   Discharge Note    Discharge    HPI:  Pt is a 87 y.o. female who is for discharge home on 02/18/21 with Home health PT and OT.  She was admitted to [facility] on 02/01/2021 post hospital admission 01/20/2021 to 02/01/2021.  She had a fall at home sustaining a left hip fracture with elevated creatinine at 3.2 from baseline of 2.1.  Orthopedics was consulted and underwent IM nailing on 12/22 by Dr. [name].  The [facility] stay was complicated by nausea and vomiting and was not able to tolerate food for 3 days.  CT scan of the abdomen and pelvis showed distal esophageal lumen with impacted food bolus in setting of prior fundoplication/hiatal hernia repair.  GI was consulted and underwent removal of food bolus.  She was found to have a creatinine 3.3 on admission with baseline creatinine of 2.1.  She was given IV fluids during her hospitalization which improved creatinine to 1.4.  Patient was admitted to this facility for short-term rehabilitation after the  patient's recent hospitalization.  Patient has completed SNF rehabilitation and therapy has cleared the patient for discharge.   Past Medical History:  Diagnosis Date   Allergic rhinitis, cause unspecified    Asthma    Esophageal reflux    Sleep apnea    on C-pap- Dr young follows   Syncope 2019   Normal LVF, negative monitor   Type II or unspecified type diabetes mellitus without mention of complication, not stated as uncontrolled    Unspecified essential hypertension    mild LVH, normal LVF, grade 2 DD by echo 2019   Past Surgical History:  Procedure Laterality Date   APPENDECTOMY     ESOPHAGOGASTRODUODENOSCOPY (EGD) WITH PROPOFOL N/A 01/29/2021   Procedure: ESOPHAGOGASTRODUODENOSCOPY (EGD) WITH PROPOFOL;  Surgeon: Ronnette Juniper, MD;  Location: [place];  Service: Gastroenterology;  Laterality: N/A;   FIXATION KYPHOPLASTY LUMBAR SPINE     FOREIGN BODY REMOVAL  01/29/2021   Procedure: FOREIGN BODY REMOVAL;  Surgeon: Ronnette Juniper, MD;  Location: [place];  Service: Gastroenterology;;   HIATAL HERNIA REPAIR     INTRAMEDULLARY (IM) NAIL INTERTROCHANTERIC Left 01/21/2021   Procedure: INTRAMEDULLARY (IM) NAIL INTERTROCHANTRIC;  Surgeon: Leandrew Koyanagi, MD;  Location: [place];  Service: Orthopedics;  Laterality: Left;   LUMBAR SPINE SURGERY     TUBAL LIGATION     VAGINAL HYSTERECTOMY      Allergies  Allergen Reactions   Aspirin Nausea And Vomiting   Codeine Nausea And Vomiting   Ibuprofen Nausea And Vomiting   Meperidine Hcl    Tape     ADHESIVE TAPE CAUSES  BLISTERS   Augmentin [Amoxicillin-Pot Clavulanate]     GI upset    No facility-administered encounter medications on file as of 02/17/2021.   Outpatient Encounter Medications as of 02/17/2021  Medication Sig   loratadine (CLARITIN) 10 MG tablet Take 10 mg by mouth daily as needed for allergies.   Nutritional Supplements (NUTRITIONAL SUPPLEMENT PO) Take by mouth daily. Magic Cup.   polyethylene glycol (MIRALAX /  GLYCOLAX) 17 g packet Take 17 g by mouth daily as needed for mild constipation.   senna-docusate (SENOKOT-S) 8.6-50 MG tablet Take 1 tablet by mouth 2 (two) times daily.   [DISCONTINUED] albuterol (PROAIR HFA) 108 (90 Base) MCG/ACT inhaler 2 puffs every 4-6 hours as directed- rescue   [DISCONTINUED] bisacodyl (DULCOLAX) 10 MG suppository If not relieved by MOM, give 10 mg Bisacodyl suppositiory rectally X 1 dose in 24 hours as needed   [DISCONTINUED] ELIQUIS 2.5 MG TABS tablet Take 2.5 mg by mouth 2 (two) times daily.   [DISCONTINUED] furosemide (LASIX) 20 MG tablet Take 20 mg by mouth daily. as needed for edema   [DISCONTINUED] hydrALAZINE (APRESOLINE) 25 MG tablet Take 25 mg by mouth 3 (three) times daily. as needed for SBP greater thn or equal to 140   [DISCONTINUED] Insulin Glargine (BASAGLAR KWIKPEN) 100 UNIT/ML Give 10 units sub q at bedtime for DM   [DISCONTINUED] Magnesium Hydroxide (MILK OF MAGNESIA PO) Take 30 mLs by mouth as needed.   [DISCONTINUED] metoprolol succinate (TOPROL-XL) 25 MG 24 hr tablet Take 25 mg by mouth in the morning and at bedtime.   [DISCONTINUED] mirtazapine (REMERON) 7.5 MG tablet Take 7.5 mg by mouth at bedtime.   [DISCONTINUED] oxyCODONE-acetaminophen (PERCOCET) 5-325 MG tablet Take 1 tablet by mouth every 8 (eight) hours as needed for severe pain.   [DISCONTINUED] pantoprazole (PROTONIX) 40 MG tablet Take 1 tablet (40 mg total) by mouth 2 (two) times daily before a meal for 28 days, THEN 1 tablet (40 mg total) daily for 28 days.   [DISCONTINUED] simvastatin (ZOCOR) 40 MG tablet Take 40 mg by mouth every evening.   [DISCONTINUED] Sodium Phosphates (RA SALINE ENEMA RE) Place rectally. If not relieved by Biscodyl suppository, give disposable Saline Enema rectally X 1 dose/24 hrs as needed   albuterol (PROAIR HFA) 108 (90 Base) MCG/ACT inhaler 2 puffs every 4-6 hours as directed- rescue   ELIQUIS 2.5 MG TABS tablet Take 1 tablet (2.5 mg total) by mouth 2 (two) times  daily.   furosemide (LASIX) 20 MG tablet Take 1 tablet (20 mg total) by mouth daily. as needed for edema   hydrALAZINE (APRESOLINE) 25 MG tablet Take 1 tablet (25 mg total) by mouth 3 (three) times daily. as needed for SBP greater thn or equal to 140   Insulin Glargine (BASAGLAR KWIKPEN) 100 UNIT/ML Inject 10 Units into the skin daily. Give 10 units sub q at bedtime for DM (Patient taking differently: Inject 10 Units into the skin at bedtime.)   metoprolol succinate (TOPROL-XL) 25 MG 24 hr tablet Take 1 tablet (25 mg total) by mouth in the morning and at bedtime.   mirtazapine (REMERON) 7.5 MG tablet Take 1 tablet (7.5 mg total) by mouth at bedtime.   oxyCODONE-acetaminophen (PERCOCET) 5-325 MG tablet Take 1 tablet by mouth every 8 (eight) hours as needed for severe pain.   PROTONIX 40 MG tablet Take 1 tablet (40 mg total) by mouth 2 (two) times daily before a meal for 28 days, THEN 1 tablet (40 mg total) 2 (two) times  daily for 28 days. (Patient taking differently: Take 1 tablet (40 mg total) by mouth 2 (two) times daily before meals for GERD)   simvastatin (ZOCOR) 40 MG tablet Take 1 tablet (40 mg total) by mouth every evening.   [DISCONTINUED] acetaminophen (TYLENOL) 500 MG tablet Take 1,000 mg by mouth every 6 (six) hours as needed for moderate pain or headache.   [DISCONTINUED] diazepam (VALIUM) 5 MG tablet Take 1 tablet (5 mg total) by mouth every 12 (twelve) hours as needed for anxiety.    Review of Systems  GENERAL: No fever or chills MOUTH and THROAT: Denies oral discomfort, gingival pain or bleeding RESPIRATORY: no cough, SOB, DOE, wheezing, hemoptysis CARDIAC: No chest pain, edema or palpitations GI: No abdominal pain, diarrhea, constipation, heart burn, nausea or vomiting GU: Denies dysuria, frequency, hematuria or discharge NEUROLOGICAL: Denies dizziness, syncope, numbness, or headache PSYCHIATRIC: Denies feelings of depression or anxiety. No report of hallucinations, insomnia,  paranoia, or agitation   Immunization History  Administered Date(s) Administered   Influenza Split 03/29/2009, 12/13/2009, 01/11/2011, 12/06/2011, 01/11/2012, 01/08/2013, 03/12/2013, 01/13/2014, 12/10/2017   Influenza Whole 11/22/2008, 12/13/2009   Influenza, High Dose Seasonal PF 11/30/2015   Influenza, Quadrivalent, Recombinant, Inj, Pf 12/16/2017, 12/31/2018, 01/05/2020, 12/09/2020   Influenza,inj,Quad PF,6+ Mos 01/13/2014, 12/15/2014   Influenza-Unspecified 01/08/2017, 01/04/2020, 12/10/2020   Janssen (J&J) SARS-COV-2 Vaccination 06/20/2019, 12/11/2019   Pneumococcal Conjugate-13 02/04/2013   Pneumococcal Polysaccharide-23 03/12/2008   Pneumococcal-Unspecified 03/29/2009, 03/30/2010   Tdap 11/02/2003, 03/29/2009, 06/29/2009   Zoster, Live 03/29/2009, 03/30/2010, 03/13/2011, 05/30/2011   Pertinent  Health Maintenance Due  Topic Date Due   FOOT EXAM  Never done   OPHTHALMOLOGY EXAM  Never done   HEMOGLOBIN A1C  07/20/2021   INFLUENZA VACCINE  Completed   DEXA SCAN  Completed   Fall Risk 01/30/2021 01/31/2021 01/31/2021 02/18/2021 02/18/2021  Falls in the past year? - - - - -  Was there an injury with Fall? - - - - -  Fall Risk Category Calculator - - - - -  Fall Risk Category - - - - -  Patient Fall Risk Level High fall risk High fall risk High fall risk High fall risk High fall risk     Vitals:   02/17/21 0304  BP: 130/70  Pulse: 70  Temp: (!) 97.1 F (36.2 C)  Weight: 154 lb 3.2 oz (69.9 kg)  Height: 5\' 3"  (1.6 m)   Body mass index is 27.32 kg/m.  Physical Exam  GENERAL APPEARANCE: Well nourished. In no acute distress. Normal body habitus SKIN:  Skin is warm and dry.  MOUTH and THROAT: Lips are without lesions. Oral mucosa is moist and without lesions.  RESPIRATORY: Breathing is even & unlabored, BS CTAB CARDIAC: RRR, no murmur,no extra heart sounds, no edema GI: Abdomen soft, normal BS, no masses, no tenderness, NEUROLOGICAL: There is no tremor. Speech is  clear. Alert and oriented X 3. PSYCHIATRIC:  Affect and behavior are appropriate  Labs reviewed: Recent Labs    06/24/20 1338 01/07/21 1217 01/27/21 0237 01/28/21 0817 01/29/21 0326 01/30/21 0311   NA 144   < > 137 138 140 139   K 4.3   < > 4.7 4.1 4.1 3.7   CL 110   < > 105 107 111 113*   CO2 26   < > 22 21* 20* 22   GLUCOSE 123*   < > 173* 163* 146* 154*   BUN 66*   < > 23 27* 25* 27*   CREATININE 2.74*   < >  1.79* 1.67* 1.57* 1.43*   CALCIUM 10.7*   < > 10.6* 10.3 9.8 9.5   MG 2.0  --  1.9 1.9  --  1.7   PHOS 3.6  --   --   --   --   --     < > = values in this interval not displayed.   Recent Labs    06/24/20 1338 01/20/21 1823   AST 27 29   ALT 28 20   ALKPHOS 68 81   BILITOT 0.6 1.4*   PROT 8.0 6.8   ALBUMIN 4.5 3.8    Recent Labs    01/29/21 0326 01/30/21 0311   WBC 10.8* 10.9*   NEUTROABS 7.2 7.2   HGB 10.2* 9.3*   HCT 31.8* 29.9*   MCV 96.1 97.1   PLT 300 272    No results found for: TSH Lab Results  Component Value Date   HGBA1C 7.9 (H) 01/20/2021   No results found for: CHOL, HDL, LDLCALC, LDLDIRECT, TRIG, CHOLHDL  Significant Diagnostic Results in last 30 days:  CT ABDOMEN PELVIS WO CONTRAST  Result Date: 01/28/2021 CLINICAL DATA:  Nausea/vomiting Abdominal pain, acute, nonlocalized EXAM: CT ABDOMEN AND PELVIS WITHOUT CONTRAST TECHNIQUE: Multidetector CT imaging of the abdomen and pelvis was performed following the standard protocol without IV contrast. COMPARISON:  X-ray intraoperative left femur 01/21/2021 FINDINGS: Lower chest: Bilateral trace pleural effusions. Bilateral lower lobe passive atelectasis. Diffuse bronchial wall thickening. The distal esophageal lumen is dilated with impacted food bolus. Hepatobiliary: No focal liver abnormality. Nonspecific hydropic gallbladder. No gallstones, gallbladder wall thickening, or pericholecystic fluid. No biliary dilatation. Pancreas: Diffusely atrophic. No focal lesion. Otherwise normal pancreatic  contour. No surrounding inflammatory changes. No main pancreatic ductal dilatation. Spleen: Punctate calcifications likely represent sequelae of prior granulomatous disease. Normal in size without focal abnormality. Adrenals/Urinary Tract: No adrenal nodule bilaterally. Bilateral perinephric stranding. No nephrolithiasis and no hydronephrosis. There is a 1.3 cm hyperdense left renal lesion with a density of 74 Hounsfield units consistent with a hemorrhagic or proteinaceous cyst. No ureterolithiasis or hydroureter. The urinary bladder is unremarkable. Stomach/Bowel: Findings suggestive fundoplication repair with markedly limited evaluation due to streak artifact from the surgical staples. Otherwise stomach is within normal limits. No evidence of bowel wall thickening or dilatation. Vascular/Lymphatic: No abdominal aorta or iliac aneurysm. Severe atherosclerotic plaque of the aorta and its branches. No abdominal, pelvic, or inguinal lymphadenopathy. Reproductive: Status post hysterectomy. No adnexal masses. Other: No intraperitoneal free fluid. No intraperitoneal free gas. No organized fluid collection. Musculoskeletal: No abdominal wall hernia or abnormality. Diffusely decreased bone density. L3 through L5 laminectomy and posterolateral fusion. No suspicious lytic or blastic osseous lesions. No acute displaced fracture. Multilevel degenerative changes of the spine. Partially visualized intramedullary nail fixation of the proximal left femur in a patient with a known acute intratrochanteric fracture. IMPRESSION: 1. Dilatation of distal esophageal lumen with impacted food bolus in the setting of prior fundoplication/hiatal hernia repair. 2. Bilateral trace pleural effusions. 3.  Aortic Atherosclerosis (ICD10-I70.0). These results will be called to the ordering clinician or representative by the Radiologist Assistant, and communication documented in the PACS or [employer]. Electronically Signed   By: Iven Finn M.D.   On: 01/28/2021 22:15   DG Chest 1 View  Result Date: 01/20/2021 CLINICAL DATA:  Status post fall.  Hip fracture. EXAM: CHEST  1 VIEW COMPARISON:  Chest x-ray 01/07/2021 FINDINGS: The heart and mediastinal contours are unchanged. Aortic calcification. No focal consolidation. No pulmonary edema.  No pleural effusion. No pneumothorax. No acute osseous abnormality. IMPRESSION: No active disease. Electronically Signed   By: Iven Finn M.D.   On: 01/20/2021 19:11   CT Head Wo Contrast  Result Date: 02/18/2021 CLINICAL DATA:  Altered mental status, nontraumatic (Ped 0-17y) EXAM: CT HEAD WITHOUT CONTRAST TECHNIQUE: Contiguous axial images were obtained from the base of the skull through the vertex without intravenous contrast. COMPARISON:  01/20/2021 FINDINGS: Brain: There is no acute intracranial hemorrhage, mass effect, or edema. Gray-white differentiation is preserved. There is no extra-axial fluid collection. Ventricles and sulci are stable in size and configuration. Patchy low-density in the supratentorial white matter is nonspecific but may reflect stable mild chronic microvascular ischemic changes. Vascular: There is atherosclerotic calcification at the skull base. Skull: Calvarium is unremarkable. Sinuses/Orbits: No acute finding. Other: None. IMPRESSION: No acute intracranial abnormality. Stable chronic findings detailed above. Electronically Signed   By: Macy Mis M.D.   On: 02/18/2021 14:13   CT HEAD WO CONTRAST (5MM)  Result Date: 01/20/2021 CLINICAL DATA:  Fall, headache EXAM: CT HEAD WITHOUT CONTRAST CT CERVICAL SPINE WITHOUT CONTRAST TECHNIQUE: Multidetector CT imaging of the head and cervical spine was performed following the standard protocol without intravenous contrast. Multiplanar CT image reconstructions of the cervical spine were also generated. COMPARISON:  None. FINDINGS: CT HEAD FINDINGS BRAIN: BRAIN Cerebral ventricle sizes are concordant with the degree of  cerebral volume loss. Patchy and confluent areas of decreased attenuation are noted throughout the deep and periventricular white matter of the cerebral hemispheres bilaterally, compatible with chronic microvascular ischemic disease. No evidence of large-territorial acute infarction. No parenchymal hemorrhage. No mass lesion. No extra-axial collection. No mass effect or midline shift. No hydrocephalus. Basilar cisterns are patent. Vascular: No hyperdense vessel. Atherosclerotic calcifications are present within the cavernous internal carotid arteries. Skull: No acute fracture or focal lesion. Sinuses/Orbits: Paranasal sinuses and mastoid air cells are clear. Bilateral lens replacement. Otherwise the orbits are unremarkable. Other: None. CT CERVICAL SPINE FINDINGS Alignment: Normal. Skull base and vertebrae: Multilevel degenerative changes of the spine. No definite severe osseous neural foraminal or central canal stenosis. No acute fracture. No aggressive appearing focal osseous lesion or focal pathologic process. Soft tissues and spinal canal: No prevertebral fluid or swelling. No visible canal hematoma. Upper chest: Unremarkable. Other: None. IMPRESSION: 1. No acute intracranial abnormality. 2. No acute displaced fracture or traumatic listhesis of the cervical spine. Electronically Signed   By: Iven Finn M.D.   On: 01/20/2021 21:10   CT Cervical Spine Wo Contrast  Result Date: 01/20/2021 CLINICAL DATA:  Fall, headache EXAM: CT HEAD WITHOUT CONTRAST CT CERVICAL SPINE WITHOUT CONTRAST TECHNIQUE: Multidetector CT imaging of the head and cervical spine was performed following the standard protocol without intravenous contrast. Multiplanar CT image reconstructions of the cervical spine were also generated. COMPARISON:  None. FINDINGS: CT HEAD FINDINGS BRAIN: BRAIN Cerebral ventricle sizes are concordant with the degree of cerebral volume loss. Patchy and confluent areas of decreased attenuation are noted  throughout the deep and periventricular white matter of the cerebral hemispheres bilaterally, compatible with chronic microvascular ischemic disease. No evidence of large-territorial acute infarction. No parenchymal hemorrhage. No mass lesion. No extra-axial collection. No mass effect or midline shift. No hydrocephalus. Basilar cisterns are patent. Vascular: No hyperdense vessel. Atherosclerotic calcifications are present within the cavernous internal carotid arteries. Skull: No acute fracture or focal lesion. Sinuses/Orbits: Paranasal sinuses and mastoid air cells are clear. Bilateral lens replacement. Otherwise the orbits are unremarkable. Other: None. CT CERVICAL  SPINE FINDINGS Alignment: Normal. Skull base and vertebrae: Multilevel degenerative changes of the spine. No definite severe osseous neural foraminal or central canal stenosis. No acute fracture. No aggressive appearing focal osseous lesion or focal pathologic process. Soft tissues and spinal canal: No prevertebral fluid or swelling. No visible canal hematoma. Upper chest: Unremarkable. Other: None. IMPRESSION: 1. No acute intracranial abnormality. 2. No acute displaced fracture or traumatic listhesis of the cervical spine. Electronically Signed   By: Iven Finn M.D.   On: 01/20/2021 21:10   CT Knee Left Wo Contrast  Result Date: 01/20/2021 CLINICAL DATA:  Knee trauma, tenderness or effusion EXAM: CT OF THE  KNEE WITHOUT CONTRAST TECHNIQUE: Multidetector CT imaging of the left knee was performed according to the standard protocol. Multiplanar CT image reconstructions were also generated. COMPARISON:  X-ray left knee 01/20/2021 FINDINGS: Bones/Joint/Cartilage Moderate to severe tricompartmental degenerative changes of the knee. Question associated chondrocalcinosis. Slight medial subluxation of the femur in relation to the patella and tibia likely due to degenerative changes. No acute displaced fracture or dislocation. Trace associated joint  effusion. Ligaments Suboptimally assessed by CT. Muscles and Tendons Grossly unremarkable. Soft tissues Unremarkable.  Vascular calcifications. IMPRESSION: No acute displaced fracture or dislocation in a patient with moderate to severe tricompartmental degenerative changes. Slight medial subluxation of the femur in relation to the patella and tibia likely due to degenerative changes. Electronically Signed   By: Iven Finn M.D.   On: 01/20/2021 21:25   DG Chest Portable 1 View  Result Date: 02/18/2021 CLINICAL DATA:  Weakness EXAM: PORTABLE CHEST 1 VIEW COMPARISON:  Chest x-ray dated January 20, 2021 FINDINGS: Cardiac and mediastinal contours within normal limits. Low lung volumes with hypoventilatory changes. No focal consolidation. No large pleural effusion or pneumothorax. IMPRESSION: Low lung volumes with hypoventilatory changes. No focal consolidation. Electronically Signed   By: Yetta Glassman M.D.   On: 02/18/2021 13:26   DG Knee Complete 4 Views Left  Result Date: 01/20/2021 CLINICAL DATA:  Fall, left knee pain EXAM: LEFT KNEE - COMPLETE 4+ VIEW COMPARISON:  None. FINDINGS: Moderate tricompartmental degenerative changes. Chondrocalcinosis in the medial and lateral compartments. No fracture or dislocation is seen. Visualized soft tissues are within normal limits. No suprapatellar knee joint effusion. IMPRESSION: Moderate degenerative changes, as above. Electronically Signed   By: Julian Hy M.D.   On: 01/20/2021 19:22   DG Abd 2 Views  Result Date: 01/26/2021 CLINICAL DATA:  Emesis EXAM: ABDOMEN - 2 VIEW COMPARISON:  07/25/2009 FINDINGS: Multiple clips in the region of the stomach. Normal bowel gas pattern. No obstruction or free air. ORIF left hip. Pedicle screw and interbody fusion L3-4. Pedicle screws in L5 not connected to L4. IMPRESSION: Normal bowel gas pattern. Electronically Signed   By: Franchot Gallo M.D.   On: 01/26/2021 13:27   DG C-Arm 1-60 Min-No Report  Result  Date: 01/21/2021 Fluoroscopy was utilized by the requesting physician.  No radiographic interpretation.   DG Hip Unilat With Pelvis 2-3 Views Left  Result Date: 01/20/2021 CLINICAL DATA:  Fall, left hip pain EXAM: DG HIP (WITH OR WITHOUT PELVIS) 2-3V LEFT COMPARISON:  None. FINDINGS: Intertrochanteric left hip fracture, mildly displaced. Right hip is intact. Visualized bony pelvis appears intact. Lumbar spine fixation hardware, incompletely visualized. IMPRESSION: Intertrochanteric left hip fracture, mildly displaced. Electronically Signed   By: Julian Hy M.D.   On: 01/20/2021 19:22   DG FEMUR MIN 2 VIEWS LEFT  Result Date: 01/21/2021 CLINICAL DATA:  Surgery, elective Z41.9 (ICD-10-CM) EXAM:  LEFT FEMUR 2 VIEWS COMPARISON:  January 20, 2021. FINDINGS: Fluoro time: 1 minute 28 seconds. Reported radiation dose: 6.82 mGy. Five C-arm fluoroscopic images were obtained intraoperatively and submitted for post operative interpretation. These images demonstrate intramedullary rod and screw fixation a intertrochanteric left femur fracture. Improved alignment, near anatomic. Please see the performing provider's procedural report for further detail. IMPRESSION: Intraoperative fluoroscopy, as detailed above. Electronically Signed   By: Margaretha Sheffield M.D.   On: 01/21/2021 10:28   XR HIP UNILAT W OR W/O PELVIS 2-3 VIEWS LEFT  Result Date: 02/14/2021 X-rays demonstrate stable alignment of the fracture without hardware complication   Assessment/Plan  1. Closed intertrochanteric fracture of hip, left, sequela -  S/P IM nailing on 01/21/2021 -   WBAT -   For home health PT and OT - oxyCODONE-acetaminophen (PERCOCET) 5-325 MG tablet; Take 1 tablet by mouth every 8 (eight) hours as needed for severe pain.  Dispense: 15 tablet; Refill: 0  2. Essential hypertension - hydrALAZINE (APRESOLINE) 25 MG tablet; Take 1 tablet (25 mg total) by mouth 3 (three) times daily. as needed for SBP greater thn or  equal to 140  Dispense: 90 tablet; Refill: 0 - metoprolol succinate (TOPROL-XL) 25 MG 24 hr tablet; Take 1 tablet (25 mg total) by mouth in the morning and at bedtime.  Dispense: 60 tablet; Refill: 0  3. Food impaction of esophagus, subsequent encounter -  currently on mechanical soft diet -  follow up with GI in 2-3 months - PROTONIX 40 MG tablet; Take 1 tablet (40 mg total) by mouth 2 (two) times daily before a meal for 28 days, THEN 1 tablet (40 mg total) 2 (two) times daily for 28 days. (Patient taking differently: Take 1 tablet (40 mg total) by mouth 2 (two) times daily before meals for GERD)  Dispense: 60 tablet; Refill: 0  4. Poor appetite - mirtazapine (REMERON) 7.5 MG tablet; Take 1 tablet (7.5 mg total) by mouth at bedtime.  Dispense: 30 tablet; Refill: 0  5. Type 2 diabetes mellitus with stage 4 chronic kidney disease, with long-term current use of insulin (HCC) - Insulin Glargine (BASAGLAR KWIKPEN) 100 UNIT/ML; Inject 10 Units into the skin daily. Give 10 units sub q at bedtime for DM (Patient taking differently: Inject 10 Units into the skin at bedtime.)  Dispense: 100 mL; Refill: 0  6. History of DVT (deep vein thrombosis) - ELIQUIS 2.5 MG TABS tablet; Take 1 tablet (2.5 mg total) by mouth 2 (two) times daily.  Dispense: 60 tablet; Refill: 0      I have filled out patient's discharge paperwork and e-prescribed medications.  Patient will have home health PT and OT.  DME provided:  None  Total discharge time: Less than 30 minutes  Discharge time involved coordination of the discharge process with social worker, nursing staff and therapy department. Medical justification for home health services verified.   Durenda Age, DNP, MSN, FNP-BC [facility] [phone] (Monday-Friday 8:00 a.m. - 5:00 p.m.) [phone] (after hours)

## 2021-02-18 ENCOUNTER — Encounter (HOSPITAL_COMMUNITY): Payer: Self-pay | Admitting: Emergency Medicine

## 2021-02-18 ENCOUNTER — Emergency Department (HOSPITAL_COMMUNITY): Payer: Medicare Other

## 2021-02-18 ENCOUNTER — Observation Stay (HOSPITAL_COMMUNITY)
Admission: EM | Admit: 2021-02-18 | Discharge: 2021-02-20 | Disposition: A | Discharge status: Home / Self Care | Payer: Medicare Other | Attending: Emergency Medicine | Admitting: Emergency Medicine

## 2021-02-18 ENCOUNTER — Other Ambulatory Visit: Payer: Self-pay

## 2021-02-18 DIAGNOSIS — G9341 Metabolic encephalopathy: Secondary | ICD-10-CM | POA: Diagnosis not present

## 2021-02-18 DIAGNOSIS — N184 Chronic kidney disease, stage 4 (severe): Secondary | ICD-10-CM | POA: Diagnosis not present

## 2021-02-18 DIAGNOSIS — F0280 Dementia in other diseases classified elsewhere without behavioral disturbance: Secondary | ICD-10-CM | POA: Insufficient documentation

## 2021-02-18 DIAGNOSIS — Z794 Long term (current) use of insulin: Secondary | ICD-10-CM | POA: Insufficient documentation

## 2021-02-18 DIAGNOSIS — R2689 Other abnormalities of gait and mobility: Secondary | ICD-10-CM | POA: Diagnosis not present

## 2021-02-18 DIAGNOSIS — G309 Alzheimer's disease, unspecified: Secondary | ICD-10-CM | POA: Diagnosis not present

## 2021-02-18 DIAGNOSIS — E1169 Type 2 diabetes mellitus with other specified complication: Secondary | ICD-10-CM

## 2021-02-18 DIAGNOSIS — N3000 Acute cystitis without hematuria: Secondary | ICD-10-CM | POA: Diagnosis not present

## 2021-02-18 DIAGNOSIS — I129 Hypertensive chronic kidney disease with stage 1 through stage 4 chronic kidney disease, or unspecified chronic kidney disease: Secondary | ICD-10-CM | POA: Diagnosis not present

## 2021-02-18 DIAGNOSIS — N179 Acute kidney failure, unspecified: Principal | ICD-10-CM | POA: Insufficient documentation

## 2021-02-18 DIAGNOSIS — R531 Weakness: Secondary | ICD-10-CM | POA: Diagnosis present

## 2021-02-18 DIAGNOSIS — E860 Dehydration: Secondary | ICD-10-CM | POA: Insufficient documentation

## 2021-02-18 DIAGNOSIS — I10 Essential (primary) hypertension: Secondary | ICD-10-CM | POA: Diagnosis present

## 2021-02-18 DIAGNOSIS — Z20822 Contact with and (suspected) exposure to covid-19: Secondary | ICD-10-CM | POA: Insufficient documentation

## 2021-02-18 DIAGNOSIS — Z7901 Long term (current) use of anticoagulants: Secondary | ICD-10-CM | POA: Insufficient documentation

## 2021-02-18 DIAGNOSIS — Z79899 Other long term (current) drug therapy: Secondary | ICD-10-CM | POA: Diagnosis not present

## 2021-02-18 DIAGNOSIS — F0390 Unspecified dementia without behavioral disturbance: Secondary | ICD-10-CM | POA: Diagnosis present

## 2021-02-18 DIAGNOSIS — R4182 Altered mental status, unspecified: Secondary | ICD-10-CM

## 2021-02-18 DIAGNOSIS — N390 Urinary tract infection, site not specified: Secondary | ICD-10-CM

## 2021-02-18 DIAGNOSIS — E1122 Type 2 diabetes mellitus with diabetic chronic kidney disease: Secondary | ICD-10-CM | POA: Diagnosis not present

## 2021-02-18 DIAGNOSIS — E119 Type 2 diabetes mellitus without complications: Secondary | ICD-10-CM

## 2021-02-18 HISTORY — DX: Metabolic encephalopathy: G93.41

## 2021-02-18 HISTORY — DX: Urinary tract infection, site not specified: N39.0

## 2021-02-18 LAB — URINALYSIS, ROUTINE W REFLEX MICROSCOPIC
Bilirubin Urine: NEGATIVE
Glucose, UA: NEGATIVE mg/dL
Hgb urine dipstick: NEGATIVE
Ketones, ur: 5 mg/dL — AB
Nitrite: POSITIVE — AB
Protein, ur: 30 mg/dL — AB
Specific Gravity, Urine: 1.017 (ref 1.005–1.030)
pH: 5 (ref 5.0–8.0)

## 2021-02-18 LAB — COMPREHENSIVE METABOLIC PANEL
ALT: 11 U/L (ref 0–44)
AST: 16 U/L (ref 15–41)
Albumin: 3.9 g/dL (ref 3.5–5.0)
Alkaline Phosphatase: 209 U/L — ABNORMAL HIGH (ref 38–126)
Anion gap: 10 (ref 5–15)
BUN: 33 mg/dL — ABNORMAL HIGH (ref 8–23)
CO2: 22 mmol/L (ref 22–32)
Calcium: 10.4 mg/dL — ABNORMAL HIGH (ref 8.9–10.3)
Chloride: 109 mmol/L (ref 98–111)
Creatinine, Ser: 1.93 mg/dL — ABNORMAL HIGH (ref 0.44–1.00)
GFR, Estimated: 24 mL/min — ABNORMAL LOW (ref >60)
Glucose, Bld: 183 mg/dL — ABNORMAL HIGH (ref 70–99)
Potassium: 3.7 mmol/L (ref 3.5–5.1)
Sodium: 141 mmol/L (ref 135–145)
Total Bilirubin: 0.8 mg/dL (ref 0.3–1.2)
Total Protein: 7 g/dL (ref 6.5–8.1)

## 2021-02-18 LAB — CBC WITH DIFFERENTIAL/PLATELET
Abs Immature Granulocytes: 0.09 10*3/uL — ABNORMAL HIGH (ref 0.00–0.07)
Basophils Absolute: 0 10*3/uL (ref 0.0–0.1)
Basophils Relative: 0 %
Eosinophils Absolute: 0 10*3/uL (ref 0.0–0.5)
Eosinophils Relative: 0 %
HCT: 38.4 % (ref 36.0–46.0)
Hemoglobin: 12 g/dL (ref 12.0–15.0)
Immature Granulocytes: 1 %
Lymphocytes Relative: 26 %
Lymphs Abs: 2.1 10*3/uL (ref 0.7–4.0)
MCH: 30.8 pg (ref 26.0–34.0)
MCHC: 31.3 g/dL (ref 30.0–36.0)
MCV: 98.5 fL (ref 80.0–100.0)
Monocytes Absolute: 0.7 10*3/uL (ref 0.1–1.0)
Monocytes Relative: 9 %
Neutro Abs: 5.1 10*3/uL (ref 1.7–7.7)
Neutrophils Relative %: 64 %
Platelets: 172 10*3/uL (ref 150–400)
RBC: 3.9 MIL/uL (ref 3.87–5.11)
RDW: 14.8 % (ref 11.5–15.5)
WBC: 8 10*3/uL (ref 4.0–10.5)
nRBC: 0 % (ref 0.0–0.2)

## 2021-02-18 LAB — CK: Total CK: 35 U/L — ABNORMAL LOW (ref 38–234)

## 2021-02-18 LAB — RESP PANEL BY RT-PCR (FLU A&B, COVID) ARPGX2
Influenza A by PCR: NEGATIVE
Influenza B by PCR: NEGATIVE
SARS Coronavirus 2 by RT PCR: NEGATIVE

## 2021-02-18 MED ORDER — PROMETHAZINE HCL 25 MG RE SUPP
25.0000 mg | Freq: Once | RECTAL | Status: DC
  Filled 2021-02-18: qty 1

## 2021-02-18 MED ORDER — SENNOSIDES-DOCUSATE SODIUM 8.6-50 MG PO TABS
1.0000 | ORAL_TABLET | Freq: Two times a day (BID) | ORAL | Status: DC
  Administered 2021-02-19 – 2021-02-20 (×2): 1 via ORAL
  Filled 2021-02-18 (×2): qty 1

## 2021-02-18 MED ORDER — METOPROLOL SUCCINATE ER 50 MG PO TB24
25.0000 mg | ORAL_TABLET | Freq: Two times a day (BID) | ORAL | Status: DC
  Administered 2021-02-18 – 2021-02-20 (×3): 25 mg via ORAL
  Filled 2021-02-18 (×3): qty 1

## 2021-02-18 MED ORDER — POLYETHYLENE GLYCOL 3350 17 G PO PACK: 17.0000 g | PACK | Freq: Every day | ORAL | Status: DC | PRN

## 2021-02-18 MED ORDER — ONDANSETRON HCL 4 MG/2ML IJ SOLN: 4.0000 mg | Freq: Four times a day (QID) | INTRAMUSCULAR | Status: DC | PRN

## 2021-02-18 MED ORDER — APIXABAN 2.5 MG PO TABS
2.5000 mg | ORAL_TABLET | Freq: Two times a day (BID) | ORAL | Status: DC
  Administered 2021-02-18 – 2021-02-20 (×3): 2.5 mg via ORAL
  Filled 2021-02-18 (×4): qty 1

## 2021-02-18 MED ORDER — ACETAMINOPHEN 325 MG PO TABS: 650.0000 mg | ORAL_TABLET | Freq: Four times a day (QID) | ORAL | Status: DC | PRN

## 2021-02-18 MED ORDER — SODIUM CHLORIDE 0.9 % IV BOLUS
500.0000 mL | Freq: Once | INTRAVENOUS | Status: AC
  Administered 2021-02-18: 500 mL via INTRAVENOUS

## 2021-02-18 MED ORDER — ACETAMINOPHEN 650 MG RE SUPP: 650.0000 mg | Freq: Four times a day (QID) | RECTAL | Status: DC | PRN

## 2021-02-18 MED ORDER — LACTATED RINGERS IV SOLN: INTRAVENOUS | Status: DC

## 2021-02-18 MED ORDER — SIMVASTATIN 20 MG PO TABS
40.0000 mg | ORAL_TABLET | Freq: Every evening | ORAL | Status: DC
  Administered 2021-02-18 – 2021-02-20 (×3): 40 mg via ORAL
  Filled 2021-02-18 (×3): qty 2

## 2021-02-18 MED ORDER — MIRTAZAPINE 15 MG PO TABS
7.5000 mg | ORAL_TABLET | Freq: Every day | ORAL | Status: DC
  Administered 2021-02-18 – 2021-02-19 (×2): 7.5 mg via ORAL
  Filled 2021-02-18 (×2): qty 1

## 2021-02-18 MED ORDER — PANTOPRAZOLE SODIUM 40 MG PO TBEC
40.0000 mg | DELAYED_RELEASE_TABLET | Freq: Two times a day (BID) | ORAL | Status: DC
  Administered 2021-02-18 – 2021-02-20 (×3): 40 mg via ORAL
  Filled 2021-02-18 (×3): qty 1

## 2021-02-18 MED ORDER — SODIUM CHLORIDE 0.9 % IV SOLN
1.0000 g | Freq: Once | INTRAVENOUS | Status: AC
  Administered 2021-02-18: 1 g via INTRAVENOUS
  Filled 2021-02-18: qty 10

## 2021-02-18 MED ORDER — ONDANSETRON HCL 4 MG PO TABS: 4.0000 mg | ORAL_TABLET | Freq: Four times a day (QID) | ORAL | Status: DC | PRN

## 2021-02-18 MED ORDER — HYDRALAZINE HCL 25 MG PO TABS
25.0000 mg | ORAL_TABLET | Freq: Three times a day (TID) | ORAL | Status: DC
  Administered 2021-02-18 – 2021-02-20 (×5): 25 mg via ORAL
  Filled 2021-02-18 (×5): qty 1

## 2021-02-18 MED ORDER — SODIUM CHLORIDE 0.9 % IV SOLN: Freq: Once | INTRAVENOUS | Status: AC

## 2021-02-18 MED ORDER — OXYCODONE-ACETAMINOPHEN 5-325 MG PO TABS: 1.0000 | ORAL_TABLET | Freq: Three times a day (TID) | ORAL | Status: DC | PRN

## 2021-02-18 MED ORDER — SODIUM CHLORIDE 0.9 % IV SOLN
1.0000 g | INTRAVENOUS | Status: DC
  Administered 2021-02-19: 1 g via INTRAVENOUS
  Filled 2021-02-18: qty 10

## 2021-02-18 MED ORDER — INSULIN ASPART 100 UNIT/ML IJ SOLN
0.0000 [IU] | INTRAMUSCULAR | Status: DC
  Administered 2021-02-19: 1 [IU] via SUBCUTANEOUS
  Administered 2021-02-20 (×2): 2 [IU] via SUBCUTANEOUS
  Filled 2021-02-18: qty 0.09

## 2021-02-18 NOTE — ED Triage Notes (Signed)
Per EMS, patient from [place], supposed to be d/c from rehab home today. Family arrived to transport patient home and informed patient has been sitting in wheelchair since 0300. PT alert to self and confused from baseline.   CBG 237 BP 103/57

## 2021-02-18 NOTE — ED Notes (Signed)
Pure Wick placed on patient for urine sample.

## 2021-02-18 NOTE — H&P (Signed)
History and Physical    [name] [name]:[id number] DOB: 10-22-1929 DOA: 02/18/2021  PCP: [name] Organ., [name]  Patient coming from: Home/SNF  I have personally briefly reviewed patient's old medical records in [name] [name]  Chief Complaint: AMS  HPI: [name] [name] is a 87 y.o. female with medical history significant of DM2, HTN.  Pt in SNF over past month following hip fx.  Was due to be discharged home today.  Pt noted to have AMS with confusion when family came to pick her up today.  She apparently had been sitting in a wheelchair since about 3 AM.  She does have some dementia but is usually much more alert and conversational.  She typically is able to ambulate for approximately 10 to 20 feet with walker assistance.  She is unable to walk this morning.  Pt not able to contribute much to history at this point.   ED Course: UA shows UTI.   Review of Systems: As per HPI, otherwise all review of systems negative.  Past Medical History:  Diagnosis Date   Allergic rhinitis, cause unspecified    Asthma    Esophageal reflux    Sleep apnea    on C-pap- Dr young follows   Syncope 2019   Normal LVF, negative monitor   Type II or unspecified type diabetes mellitus without mention of complication, not stated as uncontrolled    Unspecified essential hypertension    mild LVH, normal LVF, grade 2 DD by echo 2019    Past Surgical History:  Procedure Laterality Date   APPENDECTOMY     ESOPHAGOGASTRODUODENOSCOPY (EGD) WITH PROPOFOL N/A 01/29/2021   Procedure: ESOPHAGOGASTRODUODENOSCOPY (EGD) WITH PROPOFOL;  Surgeon: [name] [name];  Location: [place];  Service: Gastroenterology;  Laterality: N/A;   FIXATION KYPHOPLASTY LUMBAR SPINE     FOREIGN BODY REMOVAL  01/29/2021   Procedure: FOREIGN BODY REMOVAL;  Surgeon: [name] [name];  Location: [place];  Service: Gastroenterology;;   HIATAL HERNIA REPAIR     INTRAMEDULLARY (IM) NAIL INTERTROCHANTERIC Left  01/21/2021   Procedure: INTRAMEDULLARY (IM) NAIL INTERTROCHANTRIC;  Surgeon: [name] [name];  Location: [place];  Service: Orthopedics;  Laterality: Left;   LUMBAR SPINE SURGERY     TUBAL LIGATION     VAGINAL HYSTERECTOMY       reports that she has never smoked. She has never used smokeless tobacco. She reports that she does not drink alcohol and does not use drugs.  Allergies  Allergen Reactions   Aspirin Nausea And Vomiting   Codeine Nausea And Vomiting   Ibuprofen Nausea And Vomiting   Meperidine Hcl    Tape     ADHESIVE TAPE CAUSES BLISTERS   Augmentin [Amoxicillin-Pot Clavulanate]     GI upset    Family History  Problem Relation Age of Onset   Lung cancer Brother    Diabetes Mother      Prior to Admission medications   Medication Sig Start Date End Date Taking? Authorizing Provider  albuterol (PROAIR HFA) 108 (90 Base) MCG/ACT inhaler 2 puffs every 4-6 hours as directed- rescue 02/17/21   Medina-Vargas, [name] [name]  bisacodyl (DULCOLAX) 10 MG suppository If not relieved by MOM, give 10 mg Bisacodyl suppositiory rectally X 1 dose in 24 hours as needed    Provider, Historical, [name]  ELIQUIS 2.5 MG TABS tablet Take 1 tablet (2.5 mg total) by mouth 2 (two) times daily. 02/17/21   Medina-Vargas, [name] [name]  furosemide (LASIX) 20  MG tablet Take 1 tablet (20 mg total) by mouth daily. as needed for edema 02/17/21   Medina-Vargas, [name] [name]  hydrALAZINE (APRESOLINE) 25 MG tablet Take 1 tablet (25 mg total) by mouth 3 (three) times daily. as needed for SBP greater thn or equal to 140 02/17/21   Medina-Vargas, [name] [name]  Insulin Glargine (BASAGLAR KWIKPEN) 100 UNIT/ML Inject 10 Units into the skin daily. Give 10 units sub q at bedtime for DM 02/17/21   Medina-Vargas, [name] [name]  loratadine (CLARITIN) 10 MG tablet Take 10 mg by mouth daily as needed for allergies.    Provider, Historical, [name]  Magnesium Hydroxide (MILK OF MAGNESIA PO) Take 30 mLs by mouth as needed.    Provider,  Historical, [name]  metoprolol succinate (TOPROL-XL) 25 MG 24 hr tablet Take 1 tablet (25 mg total) by mouth in the morning and at bedtime. 02/17/21   Medina-Vargas, [name] [name]  mirtazapine (REMERON) 7.5 MG tablet Take 1 tablet (7.5 mg total) by mouth at bedtime. 02/17/21   Medina-Vargas, [name] [name]  Nutritional Supplements (NUTRITIONAL SUPPLEMENT PO) Take by mouth daily. Magic Cup.    Provider, Historical, [name]  oxyCODONE-acetaminophen (PERCOCET) 5-325 MG tablet Take 1 tablet by mouth every 8 (eight) hours as needed for severe pain. 02/17/21   Medina-Vargas, [name] [name]  polyethylene glycol (MIRALAX / GLYCOLAX) 17 g packet Take 17 g by mouth daily as needed for mild constipation. 01/25/21   [name] [name]  PROTONIX 40 MG tablet Take 1 tablet (40 mg total) by mouth 2 (two) times daily before a meal for 28 days, THEN 1 tablet (40 mg total) 2 (two) times daily for 28 days. 02/17/21 04/14/21  Medina-Vargas, [name] [name]  senna-docusate (SENOKOT-S) 8.6-50 MG tablet Take 1 tablet by mouth 2 (two) times daily. 01/25/21   [name] [name]  simvastatin (ZOCOR) 40 MG tablet Take 1 tablet (40 mg total) by mouth every evening. 02/17/21   Medina-Vargas, [name] [name]  Sodium Phosphates (RA SALINE ENEMA RE) Place rectally. If not relieved by Biscodyl suppository, give disposable Saline Enema rectally X 1 dose/24 hrs as needed    Provider, Historical, [name]    Physical Exam: Vitals:   02/18/21 1900 02/18/21 2013 02/18/21 2030 02/18/21 2132  BP: 131/64 (!) 181/83 (!) 168/67 (!) 183/79  Pulse: 69 76  88  Resp: 12 17 16 19   Temp:      TempSrc:      SpO2: 96% 98%  99%  Weight:      Height:        Constitutional: NAD, calm, comfortable Eyes: PERRL, lids and conjunctivae normal ENMT: Mucous membranes are moist. Posterior pharynx clear of any exudate or lesions.Normal dentition.  Neck: normal, supple, no masses, no thyromegaly Respiratory: clear to auscultation bilaterally, no wheezing, no crackles. Normal  respiratory effort. No accessory muscle use.  Cardiovascular: Regular rate and rhythm, no murmurs / rubs / gallops. No extremity edema. 2+ pedal pulses. No carotid bruits.  Abdomen: no tenderness, no masses palpated. No hepatosplenomegaly. Bowel sounds positive.  Musculoskeletal: no clubbing / cyanosis. No joint deformity upper and lower extremities. Good ROM, no contractures. Normal muscle tone.  Skin: no rashes, lesions, ulcers. No induration Neurologic: Awake, follows commands with all 4, grossly non-focal Psychiatric: Awake, confused, slow to answer questions   Labs on Admission: I have personally reviewed following labs and imaging studies  CBC: Recent Labs  Lab 02/18/21 1250  WBC 8.0  NEUTROABS 5.1  HGB 12.0  HCT 38.4  MCV 98.5  PLT 481   Basic Metabolic Panel: Recent Labs  Lab 02/18/21 1250  NA 141  K 3.7  CL 109  CO2 22  GLUCOSE 183*  BUN 33*  CREATININE 1.93*  CALCIUM 10.4*   GFR: Estimated Creatinine Clearance: 18.5 mL/min (A) (by C-G formula based on SCr of 1.93 mg/dL (H)). Liver Function Tests: Recent Labs  Lab 02/18/21 1250  AST 16  ALT 11  ALKPHOS 209*  BILITOT 0.8  PROT 7.0  ALBUMIN 3.9   No results for input(s): LIPASE, AMYLASE in the last 168 hours. No results for input(s): AMMONIA in the last 168 hours. Coagulation Profile: No results for input(s): INR, PROTIME in the last 168 hours. Cardiac Enzymes: Recent Labs  Lab 02/18/21 1250  CKTOTAL 35*   BNP (last 3 results) No results for input(s): PROBNP in the last 8760 hours. HbA1C: No results for input(s): HGBA1C in the last 72 hours. CBG: No results for input(s): GLUCAP in the last 168 hours. Lipid Profile: No results for input(s): CHOL, HDL, LDLCALC, TRIG, CHOLHDL, LDLDIRECT in the last 72 hours. Thyroid Function Tests: No results for input(s): TSH, T4TOTAL, FREET4, T3FREE, THYROIDAB in the last 72 hours. Anemia Panel: No results for input(s): VITAMINB12, FOLATE, FERRITIN, TIBC,  IRON, RETICCTPCT in the last 72 hours. Urine analysis:    Component Value Date/Time   COLORURINE AMBER (A) 02/18/2021 2005   APPEARANCEUR HAZY (A) 02/18/2021 2005   LABSPEC 1.017 02/18/2021 2005   PHURINE 5.0 02/18/2021 2005   GLUCOSEU NEGATIVE 02/18/2021 2005   HGBUR NEGATIVE 02/18/2021 2005   [place] NEGATIVE 02/18/2021 2005   KETONESUR 5 (A) 02/18/2021 2005   PROTEINUR 30 (A) 02/18/2021 2005   UROBILINOGEN 1.0 07/25/2009 1045   NITRITE POSITIVE (A) 02/18/2021 2005   LEUKOCYTESUR TRACE (A) 02/18/2021 2005    Radiological Exams on Admission: CT Head Wo Contrast  Result Date: 02/18/2021 CLINICAL DATA:  Altered mental status, nontraumatic (Ped 0-17y) EXAM: CT HEAD WITHOUT CONTRAST TECHNIQUE: Contiguous axial images were obtained from the base of the skull through the vertex without intravenous contrast. COMPARISON:  01/20/2021 FINDINGS: Brain: There is no acute intracranial hemorrhage, mass effect, or edema. Gray-white differentiation is preserved. There is no extra-axial fluid collection. Ventricles and sulci are stable in size and configuration. Patchy low-density in the supratentorial white matter is nonspecific but may reflect stable mild chronic microvascular ischemic changes. Vascular: There is atherosclerotic calcification at the skull base. Skull: Calvarium is unremarkable. Sinuses/Orbits: No acute finding. Other: None. IMPRESSION: No acute intracranial abnormality. Stable chronic findings detailed above. Electronically Signed   By: Macy Mis M.D.   On: 02/18/2021 14:13   DG Chest Portable 1 View  Result Date: 02/18/2021 CLINICAL DATA:  Weakness EXAM: PORTABLE CHEST 1 VIEW COMPARISON:  Chest x-ray dated January 20, 2021 FINDINGS: Cardiac and mediastinal contours within normal limits. Low lung volumes with hypoventilatory changes. No focal consolidation. No large pleural effusion or pneumothorax. IMPRESSION: Low lung volumes with hypoventilatory changes. No focal  consolidation. Electronically Signed   By: Yetta Glassman M.D.   On: 02/18/2021 13:26    EKG: Independently reviewed.  Assessment/Plan Principal Problem:   UTI (urinary tract infection) Active Problems:   DM2 (diabetes mellitus, type 2) (HCC)   Essential hypertension   CKD (chronic kidney disease) stage 4, GFR 15-29 ml/min (HCC)   Alzheimer's dementia ([place])   Acute metabolic encephalopathy    Delirium on top of chronic dementia - Secondary to UTI most likely Rocephin UCx pending  CT head neg Cont chronic dementia meds HTN - Cont home BP meds DM2 - SSI Q4H Hold lantus CKD 3-4 - Creat slightly up from baseline IVF Hold diuretics Strict intake and output Repeat BMP in AM  DVT prophylaxis: Cont eliquis Code Status: Full Family Communication: No family in room Disposition Plan: Likely SNF after admit Consults called: None Admission status: Place in 57    Shary Lamos, [facility]  How to contact the [facility] Attending or Consulting provider [place] or covering provider during after hours [place], for this patient?  Check the care team in [facility] and look for a) attending/consulting TRH provider listed and b) the [facility] team listed Log into [email or web address]  Amion Physician Scheduling and messaging for groups and whole hospitals  On call and physician scheduling software for group practices, residents, hospitalists and other medical providers for call, clinic, rotation and shift schedules. OnCall Enterprise is a hospital-wide system for scheduling doctors and paging doctors on call. EasyPlot is for scientific plotting and data analysis.  [email or web address]  and use [place]'s universal password to access. If you do not have the password, please contact the hospital operator.  Locate the [facility] provider you are looking for under [facility] and page to a number that you can be directly reached. If you still have difficulty reaching the provider, please page the [facility]  (Director on Call) for the Hospitalists listed on amion for assistance.  02/18/2021, 9:34 PM

## 2021-02-18 NOTE — ED Provider Notes (Addendum)
[place] DEPT Provider Note   CSN: [id number] Arrival date & time: 02/18/21  1210     History No chief complaint on file.   [name] [name] is a 87 y.o. female.  87 year old female with prior medical history as detailed below presents for evaluation.  She is accompanied by her daughter.  Patient was due to be discharged from rehab at [facility] today.  Patient was last seen by family on Thursday.  Patient was noted to be somewhat more confused than baseline on Thursday.  Patient's family found the patient to be significantly more confused than baseline today at time of pickup.  She apparently had been sitting in a wheelchair since about 3 AM.  She does have some dementia but is usually much more alert and conversational.  She typically is able to ambulate for approximately 10 to 20 feet with walker assistance.  She is unable to walk this morning.  Patient was to be discharged to home into the care of her daughter and son.  Her daughter is very concerned that she will be unable to care for her in her current state.  The history is provided by the patient.  Illness Location:  Weakness, unable to ambulate Severity:  Moderate Onset quality:  Gradual Duration:  2 days Timing:  Constant Progression:  Worsening Chronicity:  New     Past Medical History:  Diagnosis Date   Allergic rhinitis, cause unspecified    Asthma    Esophageal reflux    Sleep apnea    on C-pap- Dr young follows   Syncope 2019   Normal LVF, negative monitor   Type II or unspecified type diabetes mellitus without mention of complication, not stated as uncontrolled    Unspecified essential hypertension    mild LVH, normal LVF, grade 2 DD by echo 2019    Patient Active Problem List   Diagnosis Date Noted   Food impaction of esophagus 01/29/2021   CKD (chronic kidney disease) stage 4, GFR 15-29 ml/min (HCC) 01/20/2021   Closed intertrochanteric fracture of hip, left, sequela  01/20/2021   Alzheimer's dementia ([place]) 01/20/2021   Renal insufficiency 06/18/2019   Sleep apnea    Essential hypertension    Hyperlipidemia 08/28/2017   History of syncope 08/28/2017   Acute frontal sinusitis 09/20/2013   Obstructive sleep apnea 02/08/2011   RENAL INSUFFICIENCY 09/29/2007   DM2 (diabetes mellitus, type 2) ([place]) 03/31/2007   OBESITY 03/31/2007   Venous insufficiency (chronic) (peripheral) 03/31/2007   Asthmatic bronchitis , chronic ([place]) 03/31/2007   Seasonal and perennial allergic rhinitis 03/31/2007   Asthma, mild persistent 03/31/2007   G E R D 03/31/2007    Past Surgical History:  Procedure Laterality Date   APPENDECTOMY     ESOPHAGOGASTRODUODENOSCOPY (EGD) WITH PROPOFOL N/A 01/29/2021   Procedure: ESOPHAGOGASTRODUODENOSCOPY (EGD) WITH PROPOFOL;  Surgeon: Ronnette Juniper, MD;  Location: [place];  Service: Gastroenterology;  Laterality: N/A;   FIXATION KYPHOPLASTY LUMBAR SPINE     FOREIGN BODY REMOVAL  01/29/2021   Procedure: FOREIGN BODY REMOVAL;  Surgeon: Ronnette Juniper, MD;  Location: [place];  Service: Gastroenterology;;   HIATAL HERNIA REPAIR     INTRAMEDULLARY (IM) NAIL INTERTROCHANTERIC Left 01/21/2021   Procedure: INTRAMEDULLARY (IM) NAIL INTERTROCHANTRIC;  Surgeon: Leandrew Koyanagi, MD;  Location: [place];  Service: Orthopedics;  Laterality: Left;   LUMBAR SPINE SURGERY     TUBAL LIGATION     VAGINAL HYSTERECTOMY       OB History   No  obstetric history on file.     Family History  Problem Relation Age of Onset   Lung cancer Brother    Diabetes Mother     Social History   Tobacco Use   Smoking status: Never   Smokeless tobacco: Never  Vaping Use   Vaping Use: Never used  Substance Use Topics   Alcohol use: Never   Drug use: Never    Home Medications Prior to Admission medications   Medication Sig Start Date End Date Taking? Authorizing Provider  albuterol (PROAIR HFA) 108 (90 Base) MCG/ACT inhaler 2 puffs every 4-6 hours as  directed- rescue 02/17/21   Medina-Vargas, Monina C, NP  bisacodyl (DULCOLAX) 10 MG suppository If not relieved by MOM, give 10 mg Bisacodyl suppositiory rectally X 1 dose in 24 hours as needed    [provider]  ELIQUIS 2.5 MG TABS tablet Take 1 tablet (2.5 mg total) by mouth 2 (two) times daily. 02/17/21   Medina-Vargas, Monina C, NP  furosemide (LASIX) 20 MG tablet Take 1 tablet (20 mg total) by mouth daily. as needed for edema 02/17/21   Medina-Vargas, Monina C, NP  hydrALAZINE (APRESOLINE) 25 MG tablet Take 1 tablet (25 mg total) by mouth 3 (three) times daily. as needed for SBP greater thn or equal to 140 02/17/21   Medina-Vargas, Monina C, NP  Insulin Glargine (BASAGLAR KWIKPEN) 100 UNIT/ML Inject 10 Units into the skin daily. Give 10 units sub q at bedtime for DM 02/17/21   Medina-Vargas, Monina C, NP  loratadine (CLARITIN) 10 MG tablet Take 10 mg by mouth daily as needed for allergies.    [provider]  Magnesium Hydroxide (MILK OF MAGNESIA PO) Take 30 mLs by mouth as needed.    [provider]  metoprolol succinate (TOPROL-XL) 25 MG 24 hr tablet Take 1 tablet (25 mg total) by mouth in the morning and at bedtime. 02/17/21   Medina-Vargas, Monina C, NP  mirtazapine (REMERON) 7.5 MG tablet Take 1 tablet (7.5 mg total) by mouth at bedtime. 02/17/21   Medina-Vargas, Monina C, NP  Nutritional Supplements (NUTRITIONAL SUPPLEMENT PO) Take by mouth daily. Magic Cup.    [provider]  oxyCODONE-acetaminophen (PERCOCET) 5-325 MG tablet Take 1 tablet by mouth every 8 (eight) hours as needed for severe pain. 02/17/21   Medina-Vargas, Monina C, NP  polyethylene glycol (MIRALAX / GLYCOLAX) 17 g packet Take 17 g by mouth daily as needed for mild constipation. 01/25/21   Domenic Polite, MD  PROTONIX 40 MG tablet Take 1 tablet (40 mg total) by mouth 2 (two) times daily before a meal for 28 days, THEN 1 tablet (40 mg total) 2 (two) times daily for 28 days. 02/17/21 04/14/21   Medina-Vargas, Monina C, NP  senna-docusate (SENOKOT-S) 8.6-50 MG tablet Take 1 tablet by mouth 2 (two) times daily. 01/25/21   Domenic Polite, MD  simvastatin (ZOCOR) 40 MG tablet Take 1 tablet (40 mg total) by mouth every evening. 02/17/21   Medina-Vargas, Monina C, NP  Sodium Phosphates (RA SALINE ENEMA RE) Place rectally. If not relieved by Biscodyl suppository, give disposable Saline Enema rectally X 1 dose/24 hrs as needed    [provider]    Allergies    Aspirin, Codeine, Ibuprofen, Meperidine hcl, Tape, and Augmentin [amoxicillin-pot clavulanate]  Review of Systems   Review of Systems  Unable to perform ROS: Dementia   Physical Exam Updated Vital Signs BP 130/63   Pulse 83   Temp 97.6 F (36.4 C) (Oral)  Resp 16   SpO2 95%   Physical Exam Vitals and nursing note reviewed.  Constitutional:      General: She is not in acute distress.    Appearance: Normal appearance. She is well-developed.  HENT:     Head: Normocephalic and atraumatic.  Eyes:     Conjunctiva/sclera: Conjunctivae normal.     Pupils: Pupils are equal, round, and reactive to light.  Cardiovascular:     Rate and Rhythm: Normal rate and regular rhythm.     Heart sounds: Normal heart sounds.  Pulmonary:     Effort: Pulmonary effort is normal. No respiratory distress.     Breath sounds: Normal breath sounds.  Abdominal:     General: There is no distension.     Palpations: Abdomen is soft.     Tenderness: There is no abdominal tenderness.  Musculoskeletal:        General: No deformity. Normal range of motion.     Cervical back: Normal range of motion and neck supple.  Skin:    General: Skin is warm and dry.  Neurological:     General: No focal deficit present.     Mental Status: She is alert.     Cranial Nerves: No cranial nerve deficit.     Sensory: No sensory deficit.     Motor: No weakness.    ED Results / Procedures / Treatments   Labs (all labs ordered are listed, but only  abnormal results are displayed) Labs Reviewed  CBC WITH DIFFERENTIAL/PLATELET - Abnormal; Notable for the following components:      Result Value   Abs Immature Granulocytes 0.09 (*)    All other components within normal limits  COMPREHENSIVE METABOLIC PANEL - Abnormal; Notable for the following components:   Glucose, Bld 183 (*)    BUN 33 (*)    Creatinine, Ser 1.93 (*)    Calcium 10.4 (*)    Alkaline Phosphatase 209 (*)    GFR, Estimated 24 (*)    All other components within normal limits  RESP PANEL BY RT-PCR (FLU A&B, COVID) ARPGX2  URINALYSIS, ROUTINE W REFLEX MICROSCOPIC    EKG None  Radiology DG Chest Portable 1 View  Result Date: 02/18/2021 CLINICAL DATA:  Weakness EXAM: PORTABLE CHEST 1 VIEW COMPARISON:  Chest x-ray dated January 20, 2021 FINDINGS: Cardiac and mediastinal contours within normal limits. Low lung volumes with hypoventilatory changes. No focal consolidation. No large pleural effusion or pneumothorax. IMPRESSION: Low lung volumes with hypoventilatory changes. No focal consolidation. Electronically Signed   By: Yetta Glassman M.D.   On: 02/18/2021 13:26    Procedures Procedures   Medications Ordered in ED Medications  sodium chloride 0.9 % bolus 500 mL (has no administration in time range)    ED Course  I have reviewed the triage vital signs and the nursing notes.  Pertinent labs & imaging results that were available during my care of the patient were reviewed by me and considered in my medical decision making (see chart for details).    MDM Rules/Calculators/A&P                           MDM  MSE complete  TAYLORMARIE REGISTER was evaluated in Emergency Department on 02/18/2021 for the symptoms described in the history of present illness. She was evaluated in the context of the global COVID-19 pandemic, which necessitated consideration that the patient might be at risk for infection with the SARS-CoV-2 virus  that causes COVID-19. Institutional  protocols and algorithms that pertain to the evaluation of patients at risk for COVID-19 are in a state of rapid change based on information released by regulatory bodies including the CDC and federal and state organizations. These policies and algorithms were followed during the patient's care in the ED.   Patient arrived for evaluation for reported alteration in mentation and generalized weakness.  Patient's work-up is demonstrative of mild dehydration and likely UTI.  Patient would benefit from admission for IV fluids and IV antibiotics.  Hospitalist service is aware case and will evaluate for same.   Final Clinical Impression(s) / ED Diagnoses Final diagnoses:  Altered mental status, unspecified altered mental status type  Urinary tract infection without hematuria, site unspecified  Dehydration    Rx / DC Orders ED Discharge Orders     None        Valarie Merino, MD 02/18/21 2101    Valarie Merino, MD 02/18/21 2125

## 2021-02-19 ENCOUNTER — Encounter (HOSPITAL_COMMUNITY): Payer: Self-pay | Admitting: Internal Medicine

## 2021-02-19 DIAGNOSIS — N3000 Acute cystitis without hematuria: Secondary | ICD-10-CM

## 2021-02-19 DIAGNOSIS — N184 Chronic kidney disease, stage 4 (severe): Secondary | ICD-10-CM

## 2021-02-19 DIAGNOSIS — G309 Alzheimer's disease, unspecified: Secondary | ICD-10-CM

## 2021-02-19 DIAGNOSIS — G9341 Metabolic encephalopathy: Secondary | ICD-10-CM | POA: Diagnosis not present

## 2021-02-19 DIAGNOSIS — F0280 Dementia in other diseases classified elsewhere without behavioral disturbance: Secondary | ICD-10-CM

## 2021-02-19 LAB — GLUCOSE, CAPILLARY
Glucose-Capillary: 101 mg/dL — ABNORMAL HIGH (ref 70–99)
Glucose-Capillary: 103 mg/dL — ABNORMAL HIGH (ref 70–99)
Glucose-Capillary: 104 mg/dL — ABNORMAL HIGH (ref 70–99)
Glucose-Capillary: 105 mg/dL — ABNORMAL HIGH (ref 70–99)
Glucose-Capillary: 115 mg/dL — ABNORMAL HIGH (ref 70–99)
Glucose-Capillary: 131 mg/dL — ABNORMAL HIGH (ref 70–99)

## 2021-02-19 LAB — BASIC METABOLIC PANEL
Anion gap: 8 (ref 5–15)
BUN: 30 mg/dL — ABNORMAL HIGH (ref 8–23)
CO2: 25 mmol/L (ref 22–32)
Calcium: 9.6 mg/dL (ref 8.9–10.3)
Chloride: 111 mmol/L (ref 98–111)
Creatinine, Ser: 1.6 mg/dL — ABNORMAL HIGH (ref 0.44–1.00)
GFR, Estimated: 31 mL/min — ABNORMAL LOW (ref >60)
Glucose, Bld: 110 mg/dL — ABNORMAL HIGH (ref 70–99)
Potassium: 3.3 mmol/L — ABNORMAL LOW (ref 3.5–5.1)
Sodium: 144 mmol/L (ref 135–145)

## 2021-02-19 LAB — CBC
HCT: 34.3 % — ABNORMAL LOW (ref 36.0–46.0)
Hemoglobin: 10.5 g/dL — ABNORMAL LOW (ref 12.0–15.0)
MCH: 30.7 pg (ref 26.0–34.0)
MCHC: 30.6 g/dL (ref 30.0–36.0)
MCV: 100.3 fL — ABNORMAL HIGH (ref 80.0–100.0)
Platelets: 151 10*3/uL (ref 150–400)
RBC: 3.42 MIL/uL — ABNORMAL LOW (ref 3.87–5.11)
RDW: 14.8 % (ref 11.5–15.5)
WBC: 5 10*3/uL (ref 4.0–10.5)
nRBC: 0 % (ref 0.0–0.2)

## 2021-02-19 MED ORDER — HYDRALAZINE HCL 20 MG/ML IJ SOLN
10.0000 mg | INTRAMUSCULAR | Status: DC | PRN
  Administered 2021-02-19: 10 mg via INTRAVENOUS
  Filled 2021-02-19: qty 1

## 2021-02-19 MED ORDER — HYDRALAZINE HCL 20 MG/ML IJ SOLN
10.0000 mg | INTRAMUSCULAR | Status: DC | PRN
Start: 2021-02-19 — End: 2021-02-19

## 2021-02-19 MED ORDER — POTASSIUM CHLORIDE CRYS ER 20 MEQ PO TBCR
40.0000 meq | EXTENDED_RELEASE_TABLET | Freq: Once | ORAL | Status: AC
  Administered 2021-02-19: 40 meq via ORAL
  Filled 2021-02-19: qty 2

## 2021-02-19 NOTE — Progress Notes (Signed)
Patient is now more alert than earlier this shift, able to maintain conversation with staff. Remains confused about place, time, and situation. Took a few sips of water, no complaints at this time.

## 2021-02-19 NOTE — Progress Notes (Signed)
PROGRESS NOTE    [name] [name]  DQQ:[id number] DOB: September 14, 1929 DOA: 02/18/2021 PCP: Ginger Organ., MD    Brief Narrative:  87 year old female who was recently in the hospital for a hip fracture and was discharged to skilled nursing facility.  Per reports, plans were to discharge her home, but on the day of discharge she was noted to have altered mental status and confusion.  She was brought to the hospital for evaluation where she was noted to be somewhat dehydrated and had possible urinary tract infection.  She was started on fluids and antibiotics and admitted for further management.   Assessment & Plan:   Principal Problem:   UTI (urinary tract infection) Active Problems:   DM2 (diabetes mellitus, type 2) (HCC)   Essential hypertension   CKD (chronic kidney disease) stage 4, GFR 15-29 ml/min (HCC)   Alzheimer's dementia ([place])   Acute metabolic encephalopathy   Acute metabolic encephalopathy superimposed on baseline dementia -Likely secondary to urinary tract infection -Currently on ceftriaxone -Urine culture pending -CT head negative -Overall mental status appears to be improving  Urinary tract infection -Continue ceftriaxone -Follow-up urine culture  Acute kidney injury on chronic kidney disease stage IIIb -Baseline creatinine around 1.4 -Admission creatinine noted to be 1.9 -She was started on IV fluids -Renal function appears to be improving -We will continue to hold Lasix -Continue IV fluids  Obstructive sleep apnea -Continue on CPAP  History of DVT -Currently on Eliquis  Chronic diastolic CHF -Appears compensated -Hold diuretics for now  Type 2 diabetes -Currently on sliding scale insulin -Blood sugars have been stable -We will consider starting on basal insulin based on p.o. intake and blood sugar trend  Hypertension -Continue on metoprolol  Recent food bolus impaction and concern for possible underlying esophageal motility  disorder -Plans are to follow-up with [facility].  She needs to continue on Protonix twice a day  DVT prophylaxis: apixaban (ELIQUIS) tablet 2.5 mg Start: 02/18/21 2230 apixaban (ELIQUIS) tablet 2.5 mg  Code Status: Full code Family Communication: Updated patient's daughter over the phone Disposition Plan: Status is: Observation  The patient remains OBS appropriate and will d/c before 2 midnights.        Consultants:    Procedures:    Antimicrobials:  Ceftriaxone 12/10 >   Subjective: She is awake, alert, knows she is in the hospital.  She knows she was at a facility prior to coming to the hospital.  She denies any complaints at this time  Objective: Vitals:   02/18/21 2258 02/19/21 0419 02/19/21 0948 02/19/21 1406  BP: (!) 183/82 (!) 143/61 (!) 191/81 (!) 158/63  Pulse: 65 74 70 (!) 107  Resp: 18 20 16 14   Temp: 97.9 F (36.6 C) 97.8 F (36.6 C) 97.8 F (36.6 C) 98 F (36.7 C)  TempSrc: Oral Oral Oral   SpO2: 97% 98% 97% 96%  Weight:      Height:        Intake/Output Summary (Last 24 hours) at 02/19/2021 2012 Last data filed at 02/19/2021 1837 Gross per 24 hour  Intake 2084.16 ml  Output --  Net 2084.16 ml   Filed Weights   02/18/21 1600  Weight: 69.9 kg    Examination:  General exam: Appears calm and comfortable  Respiratory system: Clear to auscultation. Respiratory effort normal. Cardiovascular system: S1 & S2 heard, RRR. No JVD, murmurs, rubs, gallops or clicks. No pedal edema. Gastrointestinal system: Abdomen is nondistended, soft and nontender. No organomegaly or masses felt.  Normal bowel sounds heard. Central nervous system: Alert and oriented. No focal neurological deficits. Extremities: Symmetric 5 x 5 power. Skin: No rashes, lesions or ulcers Psychiatry: Judgement and insight appear normal. Mood & affect appropriate.     Data Reviewed: I have personally reviewed following labs and imaging studies  CBC: Recent Labs  Lab  02/18/21 1250 02/19/21 0619  WBC 8.0 5.0  NEUTROABS 5.1  --   HGB 12.0 10.5*  HCT 38.4 34.3*  MCV 98.5 100.3*  PLT 172 784   Basic Metabolic Panel: Recent Labs  Lab 02/18/21 1250 02/19/21 0619  NA 141 144  K 3.7 3.3*  CL 109 111  CO2 22 25  GLUCOSE 183* 110*  BUN 33* 30*  CREATININE 1.93* 1.60*  CALCIUM 10.4* 9.6   GFR: Estimated Creatinine Clearance: 22.4 mL/min (A) (by C-G formula based on SCr of 1.6 mg/dL (H)). Liver Function Tests: Recent Labs  Lab 02/18/21 1250  AST 16  ALT 11  ALKPHOS 209*  BILITOT 0.8  PROT 7.0  ALBUMIN 3.9   No results for input(s): LIPASE, AMYLASE in the last 168 hours. No results for input(s): AMMONIA in the last 168 hours. Coagulation Profile: No results for input(s): INR, PROTIME in the last 168 hours. Cardiac Enzymes: Recent Labs  Lab 02/18/21 1250  CKTOTAL 35*   BNP (last 3 results) No results for input(s): PROBNP in the last 8760 hours. HbA1C: No results for input(s): HGBA1C in the last 72 hours. CBG: Recent Labs  Lab 02/19/21 0012 02/19/21 0422 02/19/21 0715 02/19/21 1238 02/19/21 1702  GLUCAP 104* 103* 105* 115* 101*   Lipid Profile: No results for input(s): CHOL, HDL, LDLCALC, TRIG, CHOLHDL, LDLDIRECT in the last 72 hours. Thyroid Function Tests: No results for input(s): TSH, T4TOTAL, FREET4, T3FREE, THYROIDAB in the last 72 hours. Anemia Panel: No results for input(s): VITAMINB12, FOLATE, FERRITIN, TIBC, IRON, RETICCTPCT in the last 72 hours. Sepsis Labs: No results for input(s): PROCALCITON, LATICACIDVEN in the last 168 hours.  Recent Results (from the past 240 hour(s))  Resp Panel by RT-PCR (Flu A&B, Covid) Nasopharyngeal Swab     Status: None   Collection Time: 02/18/21  1:20 PM   Specimen: Nasopharyngeal Swab; Nasopharyngeal(NP) swabs in vial transport medium  Result Value Ref Range Status   SARS Coronavirus 2 by RT PCR NEGATIVE NEGATIVE Final    Comment: (NOTE) SARS-CoV-2 target nucleic acids are NOT  DETECTED.  The SARS-CoV-2 RNA is generally detectable in upper respiratory specimens during the acute phase of infection. The lowest concentration of SARS-CoV-2 viral copies this assay can detect is 138 copies/mL. A negative result does not preclude SARS-Cov-2 infection and should not be used as the sole basis for treatment or other patient management decisions. A negative result may occur with  improper specimen collection/handling, submission of specimen other than nasopharyngeal swab, presence of viral mutation(s) within the areas targeted by this assay, and inadequate number of viral copies(<138 copies/mL). A negative result must be combined with clinical observations, patient history, and epidemiological information. The expected result is Negative.  Fact Sheet for Patients:  EntrepreneurPulse.com.au  Fact Sheet for Healthcare Providers:  IncredibleEmployment.be  This test is no t yet approved or cleared by the [place] FDA and  has been authorized for detection and/or diagnosis of SARS-CoV-2 by FDA under an Emergency Use Authorization (EUA). This EUA will remain  in effect (meaning this test can be used) for the duration of the COVID-19 declaration under Section 564(b)(1) of the Act, 21 U.S.C.section  360bbb-3(b)(1), unless the authorization is terminated  or revoked sooner.       Influenza A by PCR NEGATIVE NEGATIVE Final   Influenza B by PCR NEGATIVE NEGATIVE Final    Comment: (NOTE) The Xpert Xpress SARS-CoV-2/FLU/RSV plus assay is intended as an aid in the diagnosis of influenza from Nasopharyngeal swab specimens and should not be used as a sole basis for treatment. Nasal washings and aspirates are unacceptable for Xpert Xpress SARS-CoV-2/FLU/RSV testing.  Fact Sheet for Patients: EntrepreneurPulse.com.au  Fact Sheet for Healthcare Providers: IncredibleEmployment.be  This test is not yet  approved or cleared by the [place] FDA and has been authorized for detection and/or diagnosis of SARS-CoV-2 by FDA under an Emergency Use Authorization (EUA). This EUA will remain in effect (meaning this test can be used) for the duration of the COVID-19 declaration under Section 564(b)(1) of the Act, 21 U.S.C. section 360bbb-3(b)(1), unless the authorization is terminated or revoked.  Performed at [facility], [place] [address], [place], [place] [address]          Radiology Studies: CT Head Wo Contrast  Result Date: 02/18/2021 CLINICAL DATA:  Altered mental status, nontraumatic (Ped 0-17y) EXAM: CT HEAD WITHOUT CONTRAST TECHNIQUE: Contiguous axial images were obtained from the base of the skull through the vertex without intravenous contrast. COMPARISON:  01/20/2021 FINDINGS: Brain: There is no acute intracranial hemorrhage, mass effect, or edema. Gray-white differentiation is preserved. There is no extra-axial fluid collection. Ventricles and sulci are stable in size and configuration. Patchy low-density in the supratentorial white matter is nonspecific but may reflect stable mild chronic microvascular ischemic changes. Vascular: There is atherosclerotic calcification at the skull base. Skull: Calvarium is unremarkable. Sinuses/Orbits: No acute finding. Other: None. IMPRESSION: No acute intracranial abnormality. Stable chronic findings detailed above. Electronically Signed   By: Macy Mis M.D.   On: 02/18/2021 14:13   DG Chest Portable 1 View  Result Date: 02/18/2021 CLINICAL DATA:  Weakness EXAM: PORTABLE CHEST 1 VIEW COMPARISON:  Chest x-ray dated January 20, 2021 FINDINGS: Cardiac and mediastinal contours within normal limits. Low lung volumes with hypoventilatory changes. No focal consolidation. No large pleural effusion or pneumothorax. IMPRESSION: Low lung volumes with hypoventilatory changes. No focal consolidation. Electronically Signed   By: Yetta Glassman M.D.   On: 02/18/2021 13:26        Scheduled Meds:  apixaban  2.5 mg Oral BID   hydrALAZINE  25 mg Oral TID   insulin aspart  0-9 Units Subcutaneous Q4H   metoprolol succinate  25 mg Oral BID   mirtazapine  7.5 mg Oral QHS   pantoprazole  40 mg Oral BID   senna-docusate  1 tablet Oral BID   simvastatin  40 mg Oral QPM   Continuous Infusions:  cefTRIAXone (ROCEPHIN)  IV     lactated ringers 75 mL/hr at 02/19/21 1502     LOS: 0 days    Time spent: 64mins    Kathie Dike, MD [facility]   If 7PM-7AM, please contact night-coverage [email or web address]  02/19/2021, 8:12 PM

## 2021-02-20 DIAGNOSIS — N184 Chronic kidney disease, stage 4 (severe): Secondary | ICD-10-CM | POA: Diagnosis not present

## 2021-02-20 DIAGNOSIS — R4182 Altered mental status, unspecified: Secondary | ICD-10-CM | POA: Diagnosis not present

## 2021-02-20 DIAGNOSIS — N3000 Acute cystitis without hematuria: Secondary | ICD-10-CM | POA: Diagnosis not present

## 2021-02-20 DIAGNOSIS — G309 Alzheimer's disease, unspecified: Secondary | ICD-10-CM | POA: Diagnosis not present

## 2021-02-20 LAB — BASIC METABOLIC PANEL
Anion gap: 9 (ref 5–15)
BUN: 28 mg/dL — ABNORMAL HIGH (ref 8–23)
CO2: 23 mmol/L (ref 22–32)
Calcium: 9.6 mg/dL (ref 8.9–10.3)
Chloride: 108 mmol/L (ref 98–111)
Creatinine, Ser: 1.43 mg/dL — ABNORMAL HIGH (ref 0.44–1.00)
GFR, Estimated: 35 mL/min — ABNORMAL LOW (ref >60)
Glucose, Bld: 134 mg/dL — ABNORMAL HIGH (ref 70–99)
Potassium: 3.5 mmol/L (ref 3.5–5.1)
Sodium: 140 mmol/L (ref 135–145)

## 2021-02-20 LAB — MAGNESIUM: Magnesium: 1.3 mg/dL — ABNORMAL LOW (ref 1.7–2.4)

## 2021-02-20 LAB — GLUCOSE, CAPILLARY
Glucose-Capillary: 126 mg/dL — ABNORMAL HIGH (ref 70–99)
Glucose-Capillary: 142 mg/dL — ABNORMAL HIGH (ref 70–99)
Glucose-Capillary: 161 mg/dL — ABNORMAL HIGH (ref 70–99)
Glucose-Capillary: 161 mg/dL — ABNORMAL HIGH (ref 70–99)
Glucose-Capillary: 98 mg/dL (ref 70–99)

## 2021-02-20 MED ORDER — CEFADROXIL 500 MG PO CAPS
500.0000 mg | ORAL_CAPSULE | Freq: Two times a day (BID) | ORAL | Status: DC
  Filled 2021-02-20: qty 1

## 2021-02-20 MED ORDER — MAGNESIUM SULFATE 4 GM/100ML IV SOLN
4.0000 g | Freq: Once | INTRAVENOUS | Status: AC
  Administered 2021-02-20: 4 g via INTRAVENOUS
  Filled 2021-02-20: qty 100

## 2021-02-20 MED ORDER — CEFADROXIL 500 MG PO CAPS: 500.0000 mg | ORAL_CAPSULE | Freq: Two times a day (BID) | ORAL | 0 refills | Status: DC

## 2021-02-20 MED ORDER — MENTHOL 3 MG MT LOZG
1.0000 | LOZENGE | OROMUCOSAL | Status: DC | PRN
  Filled 2021-02-20: qty 9

## 2021-02-20 MED ORDER — POTASSIUM CHLORIDE CRYS ER 20 MEQ PO TBCR
40.0000 meq | EXTENDED_RELEASE_TABLET | Freq: Once | ORAL | Status: AC
  Administered 2021-02-20: 40 meq via ORAL
  Filled 2021-02-20: qty 2

## 2021-02-20 NOTE — Discharge Summary (Signed)
Physician Discharge Summary  [name] [name] QBH:[id number] DOB: Dec 05, 1929 DOA: 02/18/2021  PCP: Ginger Organ., MD  Admit date: 02/18/2021 Discharge date: 02/20/2021  Admitted From: Skilled nursing facility Disposition: Home  Recommendations for Outpatient Follow-up:  Follow up with PCP in 1-2 weeks Please obtain BMP/CBC in one week Follow-up with GI has been scheduled  Home Health: Home health PT, OT Equipment/Devices:  Discharge Condition: Stable CODE STATUS: Full code Diet recommendation: Heart healthy, carb modified  Brief/Interim Summary: 87 year old female who was recently in the hospital for a hip fracture and was discharged to skilled nursing facility.  Per reports, plans were to discharge her home, but on the day of discharge she was noted to have altered mental status and confusion.  She was brought to the hospital for evaluation where she was noted to be somewhat dehydrated and had possible urinary tract infection.  She was started on fluids and antibiotics and admitted for further management.  Discharge Diagnoses:  Principal Problem:   UTI (urinary tract infection) Active Problems:   DM2 (diabetes mellitus, type 2) (HCC)   Essential hypertension   CKD (chronic kidney disease) stage 4, GFR 15-29 ml/min (HCC)   Alzheimer's dementia ([place])   Acute metabolic encephalopathy  Acute metabolic encephalopathy superimposed on baseline dementia -Likely secondary to urinary tract infection -Initially treated with ceftriaxone, subsequently transition to cefadroxil to complete course -Urine culture pending at the time of discharge -CT head negative -Overall mental status appears to be improving and likely back to baseline   Urinary tract infection -Treated with ceftriaxone, transition to p.o. antibiotics -Follow-up urine culture which is still pending at the time of discharge   Acute kidney injury on chronic kidney disease stage IIIb -Baseline creatinine around  1.4 -Admission creatinine noted to be 1.9 -She was started on IV fluids with improvement of creatinine back to 1.4 -Resume Lasix as needed after discharge   Obstructive sleep apnea -Continue on CPAP   History of DVT -Currently on Eliquis   Chronic diastolic CHF -Appears compensated -Can resume as needed Lasix on discharge   Type 2 diabetes -Currently on sliding scale insulin -Blood sugars have been stable -Resume home regimen on discharge   Hypertension -Continue on metoprolol   Recent food bolus impaction and concern for possible underlying esophageal motility disorder -Plans are to follow-up with [facility].  She needs to continue on Protonix twice a day  Discharge Instructions  Discharge Instructions     Diet - low sodium heart healthy   Complete by: As directed    Increase activity slowly   Complete by: As directed       Allergies as of 02/20/2021       Reactions   Aspirin Nausea And Vomiting   Codeine Nausea And Vomiting   Ibuprofen Nausea And Vomiting   Meperidine Hcl    Tape    ADHESIVE TAPE CAUSES BLISTERS   Augmentin [amoxicillin-pot Clavulanate]    GI upset        Medication List     STOP taking these medications    oxyCODONE-acetaminophen 5-325 MG tablet Commonly known as: Percocet       TAKE these medications    albuterol 108 (90 Base) MCG/ACT inhaler Commonly known as: ProAir HFA 2 puffs every 4-6 hours as directed- rescue   Basaglar KwikPen 100 UNIT/ML Inject 10 Units into the skin daily. Give 10 units sub q at bedtime for DM What changed:  when to take this additional instructions   cefadroxil 500  MG capsule Commonly known as: DURICEF Take 1 capsule (500 mg total) by mouth 2 (two) times daily.   Eliquis 2.5 MG Tabs tablet Generic drug: apixaban Take 1 tablet (2.5 mg total) by mouth 2 (two) times daily.   furosemide 20 MG tablet Commonly known as: LASIX Take 1 tablet (20 mg total) by mouth daily. as needed for edema    hydrALAZINE 25 MG tablet Commonly known as: APRESOLINE Take 1 tablet (25 mg total) by mouth 3 (three) times daily. as needed for SBP greater thn or equal to 140   loratadine 10 MG tablet Commonly known as: CLARITIN Take 10 mg by mouth daily as needed for allergies.   metoprolol succinate 25 MG 24 hr tablet Commonly known as: TOPROL-XL Take 1 tablet (25 mg total) by mouth in the morning and at bedtime.   mirtazapine 7.5 MG tablet Commonly known as: REMERON Take 1 tablet (7.5 mg total) by mouth at bedtime.   NUTRITIONAL SUPPLEMENT PO Take by mouth daily. Magic Cup.   polyethylene glycol 17 g packet Commonly known as: MIRALAX / GLYCOLAX Take 17 g by mouth daily as needed for mild constipation.   Protonix 40 MG tablet Generic drug: pantoprazole Take 1 tablet (40 mg total) by mouth 2 (two) times daily before a meal for 28 days, THEN 1 tablet (40 mg total) 2 (two) times daily for 28 days. Start taking on: February 17, 2021 What changed: See the new instructions.   senna-docusate 8.6-50 MG tablet Commonly known as: Senokot-S Take 1 tablet by mouth 2 (two) times daily.   simvastatin 40 MG tablet Commonly known as: ZOCOR Take 1 tablet (40 mg total) by mouth every evening.        Follow-up Information     [facility] Follow up.   Specialty: Home Health Services Contact information: [address] [place] [address] [phone]         Ginger Organ., MD. Schedule an appointment as soon as possible for a visit in 1 week(s).   Specialty: Internal Medicine Contact information: [place] [address] [phone]         Charlott Rakes, PA-C Follow up.   Specialty: Gastroenterology Why: 1:00pm Contact information: [address] [place] [address] [phone]                Allergies  Allergen Reactions   Aspirin Nausea And Vomiting   Codeine Nausea And Vomiting   Ibuprofen Nausea And  Vomiting   Meperidine Hcl    Tape     ADHESIVE TAPE CAUSES BLISTERS   Augmentin [Amoxicillin-Pot Clavulanate]     GI upset    Consultations:    Procedures/Studies: CT ABDOMEN PELVIS WO CONTRAST  Result Date: 01/28/2021 CLINICAL DATA:  Nausea/vomiting Abdominal pain, acute, nonlocalized EXAM: CT ABDOMEN AND PELVIS WITHOUT CONTRAST TECHNIQUE: Multidetector CT imaging of the abdomen and pelvis was performed following the standard protocol without IV contrast. COMPARISON:  X-ray intraoperative left femur 01/21/2021 FINDINGS: Lower chest: Bilateral trace pleural effusions. Bilateral lower lobe passive atelectasis. Diffuse bronchial wall thickening. The distal esophageal lumen is dilated with impacted food bolus. Hepatobiliary: No focal liver abnormality. Nonspecific hydropic gallbladder. No gallstones, gallbladder wall thickening, or pericholecystic fluid. No biliary dilatation. Pancreas: Diffusely atrophic. No focal lesion. Otherwise normal pancreatic contour. No surrounding inflammatory changes. No main pancreatic ductal dilatation. Spleen: Punctate calcifications likely represent sequelae of prior granulomatous disease. Normal in size without focal abnormality. Adrenals/Urinary  Tract: No adrenal nodule bilaterally. Bilateral perinephric stranding. No nephrolithiasis and no hydronephrosis. There is a 1.3 cm hyperdense left renal lesion with a density of 74 Hounsfield units consistent with a hemorrhagic or proteinaceous cyst. No ureterolithiasis or hydroureter. The urinary bladder is unremarkable. Stomach/Bowel: Findings suggestive fundoplication repair with markedly limited evaluation due to streak artifact from the surgical staples. Otherwise stomach is within normal limits. No evidence of bowel wall thickening or dilatation. Vascular/Lymphatic: No abdominal aorta or iliac aneurysm. Severe atherosclerotic plaque of the aorta and its branches. No abdominal, pelvic, or inguinal lymphadenopathy.  Reproductive: Status post hysterectomy. No adnexal masses. Other: No intraperitoneal free fluid. No intraperitoneal free gas. No organized fluid collection. Musculoskeletal: No abdominal wall hernia or abnormality. Diffusely decreased bone density. L3 through L5 laminectomy and posterolateral fusion. No suspicious lytic or blastic osseous lesions. No acute displaced fracture. Multilevel degenerative changes of the spine. Partially visualized intramedullary nail fixation of the proximal left femur in a patient with a known acute intratrochanteric fracture. IMPRESSION: 1. Dilatation of distal esophageal lumen with impacted food bolus in the setting of prior fundoplication/hiatal hernia repair. 2. Bilateral trace pleural effusions. 3.  Aortic Atherosclerosis (ICD10-I70.0). These results will be called to the ordering clinician or representative by the Radiologist Assistant, and communication documented in the PACS or [employer]. Electronically Signed   By: Iven Finn M.D.   On: 01/28/2021 22:15   CT Head Wo Contrast  Result Date: 02/18/2021 CLINICAL DATA:  Altered mental status, nontraumatic (Ped 0-17y) EXAM: CT HEAD WITHOUT CONTRAST TECHNIQUE: Contiguous axial images were obtained from the base of the skull through the vertex without intravenous contrast. COMPARISON:  01/20/2021 FINDINGS: Brain: There is no acute intracranial hemorrhage, mass effect, or edema. Gray-white differentiation is preserved. There is no extra-axial fluid collection. Ventricles and sulci are stable in size and configuration. Patchy low-density in the supratentorial white matter is nonspecific but may reflect stable mild chronic microvascular ischemic changes. Vascular: There is atherosclerotic calcification at the skull base. Skull: Calvarium is unremarkable. Sinuses/Orbits: No acute finding. Other: None. IMPRESSION: No acute intracranial abnormality. Stable chronic findings detailed above. Electronically Signed   By: Macy Mis M.D.   On: 02/18/2021 14:13   DG Chest Portable 1 View  Result Date: 02/18/2021 CLINICAL DATA:  Weakness EXAM: PORTABLE CHEST 1 VIEW COMPARISON:  Chest x-ray dated January 20, 2021 FINDINGS: Cardiac and mediastinal contours within normal limits. Low lung volumes with hypoventilatory changes. No focal consolidation. No large pleural effusion or pneumothorax. IMPRESSION: Low lung volumes with hypoventilatory changes. No focal consolidation. Electronically Signed   By: Yetta Glassman M.D.   On: 02/18/2021 13:26   DG Abd 2 Views  Result Date: 01/26/2021 CLINICAL DATA:  Emesis EXAM: ABDOMEN - 2 VIEW COMPARISON:  07/25/2009 FINDINGS: Multiple clips in the region of the stomach. Normal bowel gas pattern. No obstruction or free air. ORIF left hip. Pedicle screw and interbody fusion L3-4. Pedicle screws in L5 not connected to L4. IMPRESSION: Normal bowel gas pattern. Electronically Signed   By: Franchot Gallo M.D.   On: 01/26/2021 13:27   XR HIP UNILAT W OR W/O PELVIS 2-3 VIEWS LEFT  Result Date: 02/14/2021 X-rays demonstrate stable alignment of the fracture without hardware complication     Subjective: Patient is seen sitting up in chair.  She is confused, but is aware she is in the hospital.  Discharge Exam: Vitals:   02/19/21 1406 02/19/21 2031 02/20/21 0429 02/20/21 1319  BP: (!) 158/63 (!) 142/76 Marland Kitchen)  164/75 (!) 169/71  Pulse: (!) 107 81 72 76  Resp: 14 14 14 16   Temp: 98 F (36.7 C) 98.4 F (36.9 C) 97.7 F (36.5 C) 98.2 F (36.8 C)  TempSrc:  Oral Oral   SpO2: 96% 97% 95% 96%  Weight:      Height:        General: Pt is alert, awake, not in acute distress Cardiovascular: RRR, S1/S2 +, no rubs, no gallops Respiratory: CTA bilaterally, no wheezing, no rhonchi Abdominal: Soft, NT, ND, bowel sounds + Extremities: no edema, no cyanosis    The results of significant diagnostics from this hospitalization (including imaging, microbiology, ancillary and laboratory) are  listed below for reference.     Microbiology: Recent Results (from the past 240 hour(s))  Resp Panel by RT-PCR (Flu A&B, Covid) Nasopharyngeal Swab     Status: None   Collection Time: 02/18/21  1:20 PM   Specimen: Nasopharyngeal Swab; Nasopharyngeal(NP) swabs in vial transport medium  Result Value Ref Range Status   SARS Coronavirus 2 by RT PCR NEGATIVE NEGATIVE Final    Comment: (NOTE) SARS-CoV-2 target nucleic acids are NOT DETECTED.  The SARS-CoV-2 RNA is generally detectable in upper respiratory specimens during the acute phase of infection. The lowest concentration of SARS-CoV-2 viral copies this assay can detect is 138 copies/mL. A negative result does not preclude SARS-Cov-2 infection and should not be used as the sole basis for treatment or other patient management decisions. A negative result may occur with  improper specimen collection/handling, submission of specimen other than nasopharyngeal swab, presence of viral mutation(s) within the areas targeted by this assay, and inadequate number of viral copies(<138 copies/mL). A negative result must be combined with clinical observations, patient history, and epidemiological information. The expected result is Negative.  Fact Sheet for Patients:  EntrepreneurPulse.com.au  Fact Sheet for Healthcare Providers:  IncredibleEmployment.be  This test is no t yet approved or cleared by the [place] FDA and  has been authorized for detection and/or diagnosis of SARS-CoV-2 by FDA under an Emergency Use Authorization (EUA). This EUA will remain  in effect (meaning this test can be used) for the duration of the COVID-19 declaration under Section 564(b)(1) of the Act, 21 U.S.C.section 360bbb-3(b)(1), unless the authorization is terminated  or revoked sooner.       Influenza A by PCR NEGATIVE NEGATIVE Final   Influenza B by PCR NEGATIVE NEGATIVE Final    Comment: (NOTE) The Xpert Xpress  SARS-CoV-2/FLU/RSV plus assay is intended as an aid in the diagnosis of influenza from Nasopharyngeal swab specimens and should not be used as a sole basis for treatment. Nasal washings and aspirates are unacceptable for Xpert Xpress SARS-CoV-2/FLU/RSV testing.  Fact Sheet for Patients: EntrepreneurPulse.com.au  Fact Sheet for Healthcare Providers: IncredibleEmployment.be  This test is not yet approved or cleared by the [place] FDA and has been authorized for detection and/or diagnosis of SARS-CoV-2 by FDA under an Emergency Use Authorization (EUA). This EUA will remain in effect (meaning this test can be used) for the duration of the COVID-19 declaration under Section 564(b)(1) of the Act, 21 U.S.C. section 360bbb-3(b)(1), unless the authorization is terminated or revoked.  Performed at [facility], [place] [address], [place], [place] [address]   Urine Culture     Status: Abnormal (Preliminary result)   Collection Time: 02/18/21  8:00 PM   Specimen: Urine, Clean Catch  Result Value Ref Range Status   Specimen Description   Final    URINE,  CLEAN CATCH Performed at [facility], [place] [address], [place], [place] [address]    Special Requests   Final    NONE Performed at [facility], [place] [address], [place], [place] [address]    Culture (A)  Final    >=100,000 COLONIES/mL GRAM NEGATIVE RODS IDENTIFICATION AND SUSCEPTIBILITIES TO FOLLOW Performed at [facility], [place] [address], [place], [place] [address]    Report Status PENDING  Incomplete     Labs: BNP (last 3 results) No results for input(s): BNP in the last 8760 hours. Basic Metabolic Panel: Recent Labs  Lab 02/18/21 1250 02/19/21 0619 02/20/21 0528  NA 141 144 140  K 3.7 3.3* 3.5  CL 109 111 108  CO2 22 25 23   GLUCOSE 183* 110* 134*  BUN 33* 30* 28*  CREATININE 1.93* 1.60* 1.43*  CALCIUM 10.4* 9.6 9.6   MG  --   --  1.3*   Liver Function Tests: Recent Labs  Lab 02/18/21 1250  AST 16  ALT 11  ALKPHOS 209*  BILITOT 0.8  PROT 7.0  ALBUMIN 3.9   No results for input(s): LIPASE, AMYLASE in the last 168 hours. No results for input(s): AMMONIA in the last 168 hours. CBC: Recent Labs  Lab 02/18/21 1250 02/19/21 0619  WBC 8.0 5.0  NEUTROABS 5.1  --   HGB 12.0 10.5*  HCT 38.4 34.3*  MCV 98.5 100.3*  PLT 172 151   Cardiac Enzymes: Recent Labs  Lab 02/18/21 1250  CKTOTAL 35*   BNP: Invalid input(s): POCBNP CBG: Recent Labs  Lab 02/20/21 0005 02/20/21 0430 02/20/21 0724 02/20/21 1135 02/20/21 1613  GLUCAP 142* 126* 161* 161* 98   D-Dimer No results for input(s): DDIMER in the last 72 hours. Hgb A1c No results for input(s): HGBA1C in the last 72 hours. Lipid Profile No results for input(s): CHOL, HDL, LDLCALC, TRIG, CHOLHDL, LDLDIRECT in the last 72 hours. Thyroid function studies No results for input(s): TSH, T4TOTAL, T3FREE, THYROIDAB in the last 72 hours.  Invalid input(s): FREET3 Anemia work up No results for input(s): VITAMINB12, FOLATE, FERRITIN, TIBC, IRON, RETICCTPCT in the last 72 hours. Urinalysis    Component Value Date/Time   COLORURINE AMBER (A) 02/18/2021 2005   APPEARANCEUR HAZY (A) 02/18/2021 2005   LABSPEC 1.017 02/18/2021 2005   PHURINE 5.0 02/18/2021 2005   GLUCOSEU NEGATIVE 02/18/2021 2005   HGBUR NEGATIVE 02/18/2021 2005   BILIRUBINUR NEGATIVE 02/18/2021 2005   KETONESUR 5 (A) 02/18/2021 2005   PROTEINUR 30 (A) 02/18/2021 2005   UROBILINOGEN 1.0 07/25/2009 1045   NITRITE POSITIVE (A) 02/18/2021 2005   LEUKOCYTESUR TRACE (A) 02/18/2021 2005   Sepsis Labs Invalid input(s): PROCALCITONIN,  WBC,  LACTICIDVEN Microbiology Recent Results (from the past 240 hour(s))  Resp Panel by RT-PCR (Flu A&B, Covid) Nasopharyngeal Swab     Status: None   Collection Time: 02/18/21  1:20 PM   Specimen: Nasopharyngeal Swab; Nasopharyngeal(NP) swabs  in vial transport medium  Result Value Ref Range Status   SARS Coronavirus 2 by RT PCR NEGATIVE NEGATIVE Final    Comment: (NOTE) SARS-CoV-2 target nucleic acids are NOT DETECTED.  The SARS-CoV-2 RNA is generally detectable in upper respiratory specimens during the acute phase of infection. The lowest concentration of SARS-CoV-2 viral copies this assay can detect is 138 copies/mL. A negative result does not preclude SARS-Cov-2 infection and should not be used as the sole basis for treatment or other patient management decisions. A negative result may occur  with  improper specimen collection/handling, submission of specimen other than nasopharyngeal swab, presence of viral mutation(s) within the areas targeted by this assay, and inadequate number of viral copies(<138 copies/mL). A negative result must be combined with clinical observations, patient history, and epidemiological information. The expected result is Negative.  Fact Sheet for Patients:  EntrepreneurPulse.com.au  Fact Sheet for Healthcare Providers:  IncredibleEmployment.be  This test is no t yet approved or cleared by the [place] FDA and  has been authorized for detection and/or diagnosis of SARS-CoV-2 by FDA under an Emergency Use Authorization (EUA). This EUA will remain  in effect (meaning this test can be used) for the duration of the COVID-19 declaration under Section 564(b)(1) of the Act, 21 U.S.C.section 360bbb-3(b)(1), unless the authorization is terminated  or revoked sooner.       Influenza A by PCR NEGATIVE NEGATIVE Final   Influenza B by PCR NEGATIVE NEGATIVE Final    Comment: (NOTE) The Xpert Xpress SARS-CoV-2/FLU/RSV plus assay is intended as an aid in the diagnosis of influenza from Nasopharyngeal swab specimens and should not be used as a sole basis for treatment. Nasal washings and aspirates are unacceptable for Xpert Xpress  SARS-CoV-2/FLU/RSV testing.  Fact Sheet for Patients: EntrepreneurPulse.com.au  Fact Sheet for Healthcare Providers: IncredibleEmployment.be  This test is not yet approved or cleared by the [place] FDA and has been authorized for detection and/or diagnosis of SARS-CoV-2 by FDA under an Emergency Use Authorization (EUA). This EUA will remain in effect (meaning this test can be used) for the duration of the COVID-19 declaration under Section 564(b)(1) of the Act, 21 U.S.C. section 360bbb-3(b)(1), unless the authorization is terminated or revoked.  Performed at [facility], [place] [address], [place], [place] [address]   Urine Culture     Status: Abnormal (Preliminary result)   Collection Time: 02/18/21  8:00 PM   Specimen: Urine, Clean Catch  Result Value Ref Range Status   Specimen Description   Final    URINE, CLEAN CATCH Performed at [facility], [place] [address], [place], [place] [address]    Special Requests   Final    NONE Performed at [facility], [place] [address] [place], [place] [address]    Culture (A)  Final    >=100,000 COLONIES/mL GRAM NEGATIVE RODS IDENTIFICATION AND SUSCEPTIBILITIES TO FOLLOW Performed at [facility], [place] [address] [place], [place] [address]    Report Status PENDING  Incomplete     Time coordinating discharge: 38mins  SIGNED:   Kathie Dike, MD  [facility] 02/20/2021, 9:46 PM   If 7PM-7AM, please contact night-coverage [email or web address]

## 2021-02-20 NOTE — Evaluation (Signed)
Occupational Therapy Evaluation Patient Details Name: [name] [name] MRN: [id number] DOB: 05-03-1929 Today's Date: 02/20/2021   History of Present Illness ELLIEMAE BRAMAN is a 87 y.o. female admitted 02/18/21 with AMS and dx with UTI. Pt in SNF over past month following hip fx.  Was due to be discharged home on day of AMS. PMH includes very mild dementia, DM2, HTN.   Clinical Impression   Pt is from SNF - was planning on dc home when AMS happened. Pt today is oriented to self only and following one step commands but pleasant and cooperative throughout session. Min A for bed mobility, max A for LB ADL *bathing and dressing) able to ambulate into bathroom and demonstrate toilet transfer with min A and mod cues for safe hand placement and manipulation of RW. Pt able to perform grooming with min guard A standing at sink (leans against surface for balance) Pt will require HHOT post-acute to maximize safety and independence in ADL and functional transfers.       Recommendations for follow up therapy are one component of a multi-disciplinary discharge planning process, led by the attending physician.  Recommendations may be updated based on patient status, additional functional criteria and insurance authorization.   Follow Up Recommendations  Home health OT    Assistance Recommended at Discharge Frequent or constant Supervision/Assistance  Functional Status Assessment  Patient has had a recent decline in their functional status and demonstrates the ability to make significant improvements in function in a reasonable and predictable amount of time.  Equipment Recommendations  Tub/shower bench    Recommendations for Other Services PT consult     Precautions / Restrictions Precautions Precautions: Fall Restrictions Weight Bearing Restrictions: No LLE Weight Bearing: Weight bearing as tolerated Other Position/Activity Restrictions: WBAT LLE      Mobility Bed Mobility Overal bed  mobility: Needs Assistance Bed Mobility: Supine to Sit     Supine to sit: Min assist     General bed mobility comments: needs assist to pivot hips to EOB    Transfers Overall transfer level: Needs assistance Equipment used: Rolling walker (2 wheels) Transfers: Sit to/from Stand Sit to Stand: Min assist           General transfer comment: increased time, Verbal and manual cues for hand placement, pt denied dizziness with position changes      Balance Overall balance assessment: Needs assistance Sitting-balance support: No upper extremity supported;Feet supported Sitting balance-Leahy Scale: Fair Sitting balance - Comments: able to maintain static sitting balance without assist   Standing balance support: Bilateral upper extremity supported Standing balance-Leahy Scale: Poor Standing balance comment: reliant on external support and close [place] due to dizziness.                           ADL either performed or assessed with clinical judgement   ADL Overall ADL's : Needs assistance/impaired Eating/Feeding: Set up;Sitting   Grooming: Min guard;Wash/dry hands;Standing Grooming Details (indicate cue type and reason): sink level Upper Body Bathing: Minimal assistance;Sitting   Lower Body Bathing: Maximal assistance;Sitting/lateral leans   Upper Body Dressing : Minimal assistance;Sitting   Lower Body Dressing: Maximal assistance;Sit to/from stand   Toilet Transfer: Moderate assistance;Ambulation;Rolling walker (2 wheels) (BSC over toilet) Toilet Transfer Details (indicate cue type and reason): vc for safe hand placement Toileting- Clothing Manipulation and Hygiene: Maximal assistance   Tub/ Shower Transfer: Moderate assistance;Ambulation;Shower seat   Functional mobility during ADLs: Min guard;Minimal assistance;Rolling walker (  2 wheels);Cueing for safety General ADL Comments: decreased access to LB for ADL (especially on the left)     Vision Baseline  Vision/History: 1 Wears glasses Ability to See in Adequate Light: 0 Adequate Patient Visual Report: No change from baseline Vision Assessment?: No apparent visual deficits     Perception     Praxis      Pertinent Vitals/Pain Pain Assessment: Faces Faces Pain Scale: No hurt Pain Intervention(s): Monitored during session;Repositioned     Hand Dominance Right   Extremity/Trunk Assessment Upper Extremity Assessment Upper Extremity Assessment: Generalized weakness   Lower Extremity Assessment Lower Extremity Assessment: Defer to PT evaluation LLE Deficits / Details: knee ext -3/5, hip flexion -3/5, AAROM WFL LLE Sensation: WNL LLE Coordination: WNL   Cervical / Trunk Assessment Cervical / Trunk Assessment: Kyphotic   Communication Communication Communication: No difficulties   Cognition Arousal/Alertness: Awake/alert Behavior During Therapy: WFL for tasks assessed/performed Overall Cognitive Status: Impaired/Different from baseline Area of Impairment: Orientation;Problem solving;Memory;Following commands                 Orientation Level: Disoriented to;Place;Time;Situation (only oriented to self)   Memory: Decreased short-term memory Following Commands: Follows one step commands with increased time     Problem Solving: Requires verbal cues;Difficulty sequencing;Requires tactile cues General Comments: pleasantly confused, oriented to self only, can follow 1 step commands     General Comments  BP WFL this session    Exercises General Exercises - Lower Extremity Ankle Circles/Pumps: AROM;Both;5 reps;Supine Long Arc Quad: AAROM;Left;10 reps;Seated Heel Slides: AAROM;Left;10 reps;Supine   Shoulder Instructions      Home Living Family/patient expects to be discharged to:: Private residence Living Arrangements: Spouse/significant other Available Help at Discharge: Family;Available 24 hours/day (son Randall Hiss and daughter [name]) Type of Home: House Home Access:  Ramped entrance     Home Layout: One level     Bathroom Shower/Tub: Teacher, early years/pre: Standard Bathroom Accessibility: Yes How Accessible: Accessible via walker Home Equipment: Rolling Walker (2 wheels);Grab bars - toilet;Grab bars - tub/shower   Additional Comments: son and daughter can provide 67* assist      Prior Functioning/Environment Prior Level of Function : Independent/Modified Independent             Mobility Comments: Pt reports using a RW for all ambulation prior to hip fx ADLs Comments: Independent with ADL prior to initial fall        OT Problem List: Decreased strength;Decreased range of motion;Decreased activity tolerance;Impaired balance (sitting and/or standing);Decreased safety awareness;Decreased knowledge of use of DME or AE;Pain      OT Treatment/Interventions: Self-care/ADL training;Therapeutic exercise;Energy conservation;DME and/or AE instruction;Therapeutic activities;Patient/family education;Balance training    OT Goals(Current goals can be found in the care plan section) Acute Rehab OT Goals Patient Stated Goal: return home OT Goal Formulation: With patient Time For Goal Achievement: 03/06/21 Potential to Achieve Goals: Good ADL Goals Pt Will Perform Grooming: with supervision;standing Pt Will Perform Upper Body Dressing: with set-up;sitting Pt Will Perform Lower Body Dressing: with min assist;sit to/from stand Pt Will Transfer to Toilet: with supervision;ambulating Pt Will Perform Toileting - Clothing Manipulation and hygiene: with min guard assist;with caregiver independent in assisting;sit to/from stand Additional ADL Goal #1: Pt will perform bed mobility at mod I level prior to engaging in ADL without the use of bed rails  OT Frequency: Min 2X/week   Barriers to D/C:            Co-evaluation PT/OT/SLP Co-Evaluation/Treatment: Yes Reason  for Co-Treatment: Necessary to address cognition/behavior during functional  activity;For patient/therapist safety;To address functional/ADL transfers PT goals addressed during session: Mobility/safety with mobility;Balance;Proper use of DME OT goals addressed during session: ADL's and self-care;Strengthening/ROM      AM-PAC OT "6 Clicks" Daily Activity     Outcome Measure Help from another person eating meals?: A Little Help from another person taking care of personal grooming?: A Little Help from another person toileting, which includes using toliet, bedpan, or urinal?: A Lot Help from another person bathing (including washing, rinsing, drying)?: A Lot Help from another person to put on and taking off regular upper body clothing?: A Little Help from another person to put on and taking off regular lower body clothing?: A Lot 6 Click Score: 15   End of Session Equipment Utilized During Treatment: Gait belt;Rolling walker (2 wheels) Nurse Communication: Mobility status  Activity Tolerance: Patient tolerated treatment well Patient left: in chair;with call bell/phone within reach;with chair alarm set;Other (comment) (talking with daughter on phone)  OT Visit Diagnosis: Unsteadiness on feet (R26.81);Other abnormalities of gait and mobility (R26.89);Muscle weakness (generalized) (M62.81);History of falling (Z91.81)                Time: 0935-1006 OT Time Calculation (min): 31 min Charges:  OT General Charges $OT Visit: 1 Visit OT Evaluation $OT Eval Moderate Complexity: [place] OTR/L Acute Rehabilitation Services Pager: [phone] Office: [place] 02/20/2021, 11:50 AM

## 2021-02-20 NOTE — TOC Initial Note (Signed)
Transition of Care [facility]) - Initial/Assessment Note    Patient Details  Name: [name] [name] MRN: [id number] Date of Birth: [birth year]  [facility] CM/SW Contact:    Lynnell Catalan, RN Phone Number: 02/20/2021, 2:58 PM  Clinical Narrative:                 Pt was at [facility] prior to admission. [facility] was getting ready to dc pt home and they had set pt up to have home health services with [facility]. Per PT eval in the hospital HHPT was recommended. [facility] will be notified once pt is discharged that they can start services. Will need MD orders for HHPT/OT.   Expected Discharge Plan: [place] Barriers to Discharge: Continued Medical Work up   Expected Discharge Plan and Services Expected Discharge Plan: [place]   Discharge Planning Services: CM Consult   Living arrangements for the past 2 months: Skilled Nursing Facility                    HH Arranged: PT, OT [facility] Agency: [place] Date [place]: 02/20/21 Time [place]: [id number]    Prior Living Arrangements/Services Living arrangements for the past 2 months: [place] Lives with:: Spouse          Need for Family Participation in Patient Care: Yes (Comment) Care giver support system in place?: Yes (comment) Current home services: Home OT, Home PT    Activities of Daily Living Home Assistive Devices/Equipment: Walker (specify type) ADL Screening (condition at time of admission) Patient's cognitive ability adequate to safely complete daily activities?: No Is the patient deaf or have difficulty hearing?: No Does the patient have difficulty seeing, even when wearing glasses/contacts?: No Does the patient have difficulty concentrating, remembering, or making decisions?: No Patient able to express need for assistance with ADLs?: Yes Does the patient have difficulty dressing or bathing?: Yes Independently performs  ADLs?: No Communication: Independent Dressing (OT): Needs assistance Is this a change from baseline?: Change from baseline, expected to last <3days Grooming: Needs assistance Feeding: Independent Bathing: Needs assistance Is this a change from baseline?: Change from baseline, expected to last <3 days In/Out Bed: Needs assistance Walks in Home: Needs assistance Is this a change from baseline?: Pre-admission baseline Does the patient have difficulty walking or climbing stairs?: Yes Weakness of Legs: Both Weakness of Arms/Hands: Both          Emotional Assessment         Alcohol / Substance Use: Not Applicable Psych Involvement: No (comment)  Admission diagnosis:  Dehydration [E86.0] UTI (urinary tract infection) [N39.0] Urinary tract infection without hematuria, site unspecified [N39.0] Altered mental status, unspecified altered mental status type [R41.82] Patient Active Problem List   Diagnosis Date Noted   UTI (urinary tract infection) 37/85/8850   Acute metabolic encephalopathy 27/74/1287   Food impaction of esophagus 01/29/2021   CKD (chronic kidney disease) stage 4, GFR 15-29 ml/min ([place]) 01/20/2021   Closed intertrochanteric fracture of hip, left, sequela 01/20/2021   Alzheimer's dementia ([place]) 01/20/2021   Renal insufficiency 06/18/2019   Sleep apnea    Essential hypertension    Hyperlipidemia 08/28/2017   History of syncope 08/28/2017   Acute frontal sinusitis 09/20/2013   Obstructive sleep apnea 02/08/2011   RENAL INSUFFICIENCY 09/29/2007   DM2 (diabetes mellitus, type 2) ([place]) 03/31/2007   OBESITY 03/31/2007   Venous insufficiency (chronic) (peripheral) 03/31/2007   Asthmatic  bronchitis , chronic ([place]) 03/31/2007   Seasonal and perennial allergic rhinitis 03/31/2007   Asthma, mild persistent 03/31/2007   G E R D 03/31/2007   PCP:  Ginger Organ., MD Pharmacy:   [place], [place] [address] [place]  [address] Phone: [phone] Fax: [phone]  [id number] - [place], [address] [place] [address] Phone: [phone] Fax: [place], [place] [address] [place] [address] Phone: [phone] Fax: [phone]     Social Determinants of Health (SDOH) Interventions    Readmission Risk Interventions No flowsheet data found.

## 2021-02-20 NOTE — Evaluation (Signed)
Physical Therapy Evaluation Patient Details Name: [name] [name] MRN: [id number] DOB: 10-26-1929 Today's Date: 02/20/2021  History of Present Illness  87 year old female who was recently in the hospital for a hip fracture and was discharged to skilled nursing facility.  Per reports, plans were to discharge her home, but on the day of discharge she was noted to have altered mental status and confusion.  She was brought to the hospital for evaluation where she was noted to be somewhat dehydrated and had possible urinary tract infection.  Clinical Impression  Pt admitted with above diagnosis. Pt ambulated 15' x 2 with RW, no loss of balance. She is pleasantly confused, oriented to self only, but able to follow commands. Her family is able to provide 24* assistance and has needed DME. HHPT recommended.  Pt currently with functional limitations due to the deficits listed below (see PT Problem List). Pt will benefit from skilled PT to increase their independence and safety with mobility to allow discharge to the venue listed below.          Recommendations for follow up therapy are one component of a multi-disciplinary discharge planning process, led by the attending physician.  Recommendations may be updated based on patient status, additional functional criteria and insurance authorization.  Follow Up Recommendations Home health PT    Assistance Recommended at Discharge Intermittent Supervision/Assistance  Functional Status Assessment Patient has had a recent decline in their functional status and demonstrates the ability to make significant improvements in function in a reasonable and predictable amount of time.  Equipment Recommendations  Rolling walker (2 wheels);BSC/3in1    Recommendations for Other Services       Precautions / Restrictions Precautions Precautions: Fall Restrictions Weight Bearing Restrictions: No LLE Weight Bearing: Weight bearing as tolerated Other Position/Activity  Restrictions: WBAT LLE      Mobility  Bed Mobility Overal bed mobility: Needs Assistance Bed Mobility: Supine to Sit     Supine to sit: Min assist     General bed mobility comments: needs assist to pivot hips to EOB    Transfers Overall transfer level: Needs assistance Equipment used: Rolling walker (2 wheels) Transfers: Sit to/from Stand Sit to Stand: Min assist           General transfer comment: increased time, Verbal and manual cues for hand placement, pt denied dizziness with position changes    Ambulation/Gait Ambulation/Gait assistance: Min guard Gait Distance (Feet): 15 Feet (15' x 2) Assistive device: Rolling walker (2 wheels) Gait Pattern/deviations: Step-to pattern       General Gait Details: VCs for positioning in RW, min/guard safety, no loss of balance  Stairs            Wheelchair Mobility    Modified Rankin (Stroke Patients Only)       Balance     Sitting balance-Leahy Scale: Fair     Standing balance support: Bilateral upper extremity supported Standing balance-Leahy Scale: Poor                               Pertinent Vitals/Pain Faces Pain Scale: No hurt    Home Living Family/patient expects to be discharged to:: Private residence Living Arrangements: Spouse/significant other Available Help at Discharge: Family;Available 24 hours/day Type of Home: House Home Access: Ramped entrance       Home Layout: One level Home Equipment: Rolling Walker (2 wheels);Grab bars - toilet;Grab bars - tub/shower Additional Comments: son and daughter  can provide 24* assist    Prior Function Prior Level of Function : Independent/Modified Independent             Mobility Comments: Pt reports using a RW for all ambulation prior to hip fx ADLs Comments: Independent with ADL;s     Hand Dominance   Dominant Hand: Right    Extremity/Trunk Assessment   Upper Extremity Assessment Upper Extremity Assessment: Defer to OT  evaluation    Lower Extremity Assessment Lower Extremity Assessment: LLE deficits/detail LLE Deficits / Details: knee ext -3/5, hip flexion -3/5, AAROM WFL LLE Sensation: WNL LLE Coordination: WNL    Cervical / Trunk Assessment Cervical / Trunk Assessment: Kyphotic  Communication   Communication: No difficulties  Cognition Arousal/Alertness: Awake/alert Behavior During Therapy: WFL for tasks assessed/performed Overall Cognitive Status: Impaired/Different from baseline Area of Impairment: Orientation;Problem solving;Memory;Following commands                 Orientation Level: Disoriented to;Place;Time;Situation   Memory: Decreased short-term memory Following Commands: Follows one step commands with increased time     Problem Solving: Requires verbal cues;Difficulty sequencing;Requires tactile cues General Comments: pleasantly confused, oriented to self only, can follow 1 step commands        General Comments      Exercises General Exercises - Lower Extremity Ankle Circles/Pumps: AROM;Both;5 reps;Supine Long Arc Quad: AAROM;Left;10 reps;Seated Heel Slides: AAROM;Left;10 reps;Supine   Assessment/Plan    PT Assessment Patient needs continued PT services  PT Problem List Decreased strength;Decreased range of motion;Decreased activity tolerance;Decreased balance;Decreased mobility;Decreased coordination;Decreased cognition;Decreased knowledge of use of DME;Decreased safety awareness;Decreased knowledge of precautions;Pain       PT Treatment Interventions DME instruction;Gait training;Functional mobility training;Therapeutic activities;Therapeutic exercise;Balance training;Neuromuscular re-education;Cognitive remediation;Patient/family education    PT Goals (Current goals can be found in the Care Plan section)  Acute Rehab PT Goals PT Goal Formulation: Patient unable to participate in goal setting Time For Goal Achievement: 03/06/21 Potential to Achieve Goals:  Good    Frequency Min 3X/week   Barriers to discharge        Co-evaluation PT/OT/SLP Co-Evaluation/Treatment: Yes Reason for Co-Treatment: Necessary to address cognition/behavior during functional activity;To address functional/ADL transfers PT goals addressed during session: Mobility/safety with mobility;Balance;Proper use of DME         AM-PAC PT "6 Clicks" Mobility  Outcome Measure Help needed turning from your back to your side while in a flat bed without using bedrails?: A Little Help needed moving from lying on your back to sitting on the side of a flat bed without using bedrails?: A Little Help needed moving to and from a bed to a chair (including a wheelchair)?: A Little Help needed standing up from a chair using your arms (e.g., wheelchair or bedside chair)?: A Little Help needed to walk in hospital room?: A Little Help needed climbing 3-5 steps with a railing? : A Lot 6 Click Score: 17    End of Session Equipment Utilized During Treatment: Gait belt Activity Tolerance: Patient tolerated treatment well Patient left: in chair;with call bell/phone within reach;with chair alarm set Nurse Communication: Mobility status PT Visit Diagnosis: Unsteadiness on feet (R26.81);Other abnormalities of gait and mobility (R26.89)    Time: 0935-1006 PT Time Calculation (min) (ACUTE ONLY): 31 min   Charges:   PT Evaluation $PT Eval Moderate Complexity: 1 Mod         Philomena Doheny PT 02/20/2021  Acute Rehabilitation Services Pager [phone] Office [phone]

## 2021-02-20 NOTE — Progress Notes (Signed)
Patient stated that she could not wear CPAP because it makes her feel like she is smothering.

## 2021-02-20 NOTE — Progress Notes (Signed)
Pt was pleasantly confused at the start of the 1900 shift, A&O to self only and concerned about getting back home. She was able to take her medications without issue. Now at 0230 she has started trying to get out of bed, still adamant about going home, and is accusing staff of holding her here against her will and treating her badly. She was assisted back to bed with bed alarm activated. Continue to monitor. Hortencia Conradi RN

## 2021-02-21 LAB — URINE CULTURE: Culture: >=100000 — AB

## 2021-03-16 ENCOUNTER — Ambulatory Visit (INDEPENDENT_AMBULATORY_CARE_PROVIDER_SITE_OTHER): Payer: Medicare Other | Admitting: Orthopaedic Surgery

## 2021-03-16 ENCOUNTER — Ambulatory Visit (INDEPENDENT_AMBULATORY_CARE_PROVIDER_SITE_OTHER): Payer: Medicare Other

## 2021-03-16 ENCOUNTER — Other Ambulatory Visit: Payer: Self-pay

## 2021-03-16 ENCOUNTER — Encounter: Payer: Self-pay | Admitting: Orthopaedic Surgery

## 2021-03-16 DIAGNOSIS — S72142S Displaced intertrochanteric fracture of left femur, sequela: Secondary | ICD-10-CM

## 2021-03-16 DIAGNOSIS — M25552 Pain in left hip: Secondary | ICD-10-CM

## 2021-03-16 NOTE — Progress Notes (Signed)
Post-Op Visit Note   Patient: [name] [name]           Date of Birth: 1929/12/29           MRN: [id number] Visit Date: 03/16/2021 PCP: Ginger Organ., MD   Assessment & Plan:  Chief Complaint:  Chief Complaint  Patient presents with   Left Hip - Follow-up    S/p left hip IM nailing 01/21/2021   Visit Diagnoses:  1. Closed intertrochanteric fracture of hip, left, sequela   2. Pain in left hip     Plan: [name] [name] is a week status post IM nail left intertrochanteric hip fracture.  Overall doing well.  She has been released back to her home.  She is currently doing home health PT.  Has some soreness in her hip but no real complaints otherwise.  She has good strong support system.  Left hip exam is fairly benign.  Fully healed surgical scars.  No pain with gentle range of motion.  The x-rays demonstrate continued healing of the fracture without any hardware complications.  She will continue to do physical therapy at home.  We will recheck her in 6 weeks with two-view x-rays left femur.   Follow-Up Instructions: Return in about 6 weeks (around 04/27/2021).   Orders:  Orders Placed This Encounter  Procedures   XR FEMUR MIN 2 VIEWS LEFT   No orders of the defined types were placed in this encounter.   Imaging: XR FEMUR MIN 2 VIEWS LEFT  Result Date: 03/16/2021 There is evidence of fracture consolidation and continued healing.  There has been compression across the fracture.   PMFS History: Patient Active Problem List   Diagnosis Date Noted   UTI (urinary tract infection) 44/31/5400   Acute metabolic encephalopathy 86/76/1950   Food impaction of esophagus 01/29/2021   CKD (chronic kidney disease) stage 4, GFR 15-29 ml/min (HCC) 01/20/2021   Closed intertrochanteric fracture of hip, left, sequela 01/20/2021   Alzheimer's dementia ([place]) 01/20/2021   Renal insufficiency 06/18/2019   Sleep apnea    Essential hypertension    Hyperlipidemia 08/28/2017   History of syncope  08/28/2017   Acute frontal sinusitis 09/20/2013   Obstructive sleep apnea 02/08/2011   RENAL INSUFFICIENCY 09/29/2007   DM2 (diabetes mellitus, type 2) ([place]) 03/31/2007   OBESITY 03/31/2007   Venous insufficiency (chronic) (peripheral) 03/31/2007   Asthmatic bronchitis , chronic (HCC) 03/31/2007   Seasonal and perennial allergic rhinitis 03/31/2007   Asthma, mild persistent 03/31/2007   G E R D 03/31/2007   Past Medical History:  Diagnosis Date   Allergic rhinitis, cause unspecified    Asthma    Esophageal reflux    Sleep apnea    on C-pap- Dr young follows   Syncope 2019   Normal LVF, negative monitor   Type II or unspecified type diabetes mellitus without mention of complication, not stated as uncontrolled    Unspecified essential hypertension    mild LVH, normal LVF, grade 2 DD by echo 2019    Family History  Problem Relation Age of Onset   Lung cancer Brother    Diabetes Mother     Past Surgical History:  Procedure Laterality Date   APPENDECTOMY     ESOPHAGOGASTRODUODENOSCOPY (EGD) WITH PROPOFOL N/A 01/29/2021   Procedure: ESOPHAGOGASTRODUODENOSCOPY (EGD) WITH PROPOFOL;  Surgeon: Ronnette Juniper, MD;  Location: [facility];  Service: Gastroenterology;  Laterality: N/A;   FIXATION KYPHOPLASTY LUMBAR SPINE     FOREIGN BODY REMOVAL  01/29/2021  Procedure: FOREIGN BODY REMOVAL;  Surgeon: Ronnette Juniper, MD;  Location: [facility];  Service: Gastroenterology;;   HIATAL HERNIA REPAIR     INTRAMEDULLARY (IM) NAIL INTERTROCHANTERIC Left 01/21/2021   Procedure: INTRAMEDULLARY (IM) NAIL INTERTROCHANTRIC;  Surgeon: Leandrew Koyanagi, MD;  Location: [facility];  Service: Orthopedics;  Laterality: Left;   LUMBAR SPINE SURGERY     TUBAL LIGATION     VAGINAL HYSTERECTOMY     Social History   Occupational History    Comment: [employer]  Tobacco Use   Smoking status: Never   Smokeless tobacco: Never  Vaping Use   Vaping Use: Never used  Substance and Sexual Activity   Alcohol use: Never    Drug use: Never   Sexual activity: Not Currently

## 2021-05-02 ENCOUNTER — Ambulatory Visit (INDEPENDENT_AMBULATORY_CARE_PROVIDER_SITE_OTHER): Payer: Medicare Other

## 2021-05-02 ENCOUNTER — Other Ambulatory Visit: Payer: Self-pay

## 2021-05-02 ENCOUNTER — Telehealth: Payer: Self-pay

## 2021-05-02 ENCOUNTER — Ambulatory Visit (INDEPENDENT_AMBULATORY_CARE_PROVIDER_SITE_OTHER): Payer: Medicare Other | Admitting: Orthopaedic Surgery

## 2021-05-02 ENCOUNTER — Encounter: Payer: Self-pay | Admitting: Orthopaedic Surgery

## 2021-05-02 DIAGNOSIS — S72142S Displaced intertrochanteric fracture of left femur, sequela: Secondary | ICD-10-CM

## 2021-05-02 DIAGNOSIS — M25552 Pain in left hip: Secondary | ICD-10-CM | POA: Diagnosis not present

## 2021-05-02 DIAGNOSIS — M1712 Unilateral primary osteoarthritis, left knee: Secondary | ICD-10-CM | POA: Diagnosis not present

## 2021-05-02 NOTE — Telephone Encounter (Signed)
Submit for Left knee gel injection

## 2021-05-02 NOTE — Progress Notes (Addendum)
Office Visit Note   Patient: [name] [name]           Date of Birth: Mar 18, 1929           MRN: [id number] Visit Date: 05/02/2021              Requested by: Ginger Organ., MD [address] [place],  [place] [address] PCP: Ginger Organ., MD   Assessment & Plan: Visit Diagnoses:  1. Closed intertrochanteric fracture of hip, left, sequela   2. Unilateral primary osteoarthritis, left knee     Plan: Impression is approximately 3 months status post left hip IM nail in addition to left knee advanced degenerative joint disease.  In regards to the hip, she is doing well.  She will continue to advance with activity as tolerated.  Follow-up with [place] as needed.  In regards to the knee, we have discussed operative versus nonoperative treatment options.  Due to her age and multiple comorbidities she is not interested in surgery.  She would like to repeat cortisone injection for now and get approval for viscosupplementation injection to have 3 months from now.  She will follow-up with [place] once approved.  Call with concerns or questions.  Follow-Up Instructions: Return for once approved for visco inj.   Orders:  Orders Placed This Encounter  Procedures   Large Joint Inj: L knee   XR FEMUR MIN 2 VIEWS LEFT   Meds ordered this encounter  Medications   bupivacaine (MARCAINE) 0.25 % (with pres) injection 2 mL   lidocaine (XYLOCAINE) 1 % (with pres) injection 2 mL   methylPREDNISolone acetate (DEPO-MEDROL) injection 40 mg      Procedures: No procedures performed   Clinical Data: No additional findings.   Subjective: Chief Complaint  Patient presents with   Left Hip - Pain    HPI patient is a pleasant 88 year old female who comes in today approximately 12 weeks status post left hip IM nail from an intertrochanteric femur fracture, date of surgery 01/21/2021.  She has been doing great in regards to the left hip.  She is regained a fair amount of range of motion and strength as  she has finished home health physical therapy.  Her issue now is left knee pain.  She has a history of underlying degenerative joint disease.  She has had cortisone and viscosupplementation injections in the past with good but temporary relief.  She feels as though her knee pain is limiting her ability to participate in activities and to even cook a meal as she has increased pain with standing for a long time.  She has been taking Tylenol but is unable to take NSAIDs as she is on Eliquis.  Review of Systems as detailed in HPI.  All others reviewed are negative.   Objective: Vital Signs: There were no vitals taken for this visit.  Physical Exam well-developed well-nourished female no acute distress.  Alert and oriented x3.  Ortho Exam left hip exam shows a painless logroll.  She has good hip flexion.  Left knee exam shows range of motion from 0 to 100 degrees.  Medial and lateral joint line tenderness.  Moderate patellofemoral crepitus.  She is neurovascular tact distally.  Specialty Comments:  No specialty comments available.  Imaging: No results found.   PMFS History: Patient Active Problem List   Diagnosis Date Noted   UTI (urinary tract infection) 23/55/7322   Acute metabolic encephalopathy 02/54/2706   Food impaction of esophagus 01/29/2021  CKD (chronic kidney disease) stage 4, GFR 15-29 ml/min (HCC) 01/20/2021   Closed intertrochanteric fracture of hip, left, sequela 01/20/2021   Alzheimer's dementia ([place]) 01/20/2021   Renal insufficiency 06/18/2019   Sleep apnea    Essential hypertension    Hyperlipidemia 08/28/2017   History of syncope 08/28/2017   Acute frontal sinusitis 09/20/2013   Obstructive sleep apnea 02/08/2011   RENAL INSUFFICIENCY 09/29/2007   DM2 (diabetes mellitus, type 2) ([facility]) 03/31/2007   OBESITY 03/31/2007   Venous insufficiency (chronic) (peripheral) 03/31/2007   Asthmatic bronchitis , chronic (HCC) 03/31/2007   Seasonal and perennial allergic  rhinitis 03/31/2007   Asthma, mild persistent 03/31/2007   G E R D 03/31/2007   Past Medical History:  Diagnosis Date   Allergic rhinitis, cause unspecified    Asthma    Esophageal reflux    Sleep apnea    on C-pap- Dr young follows   Syncope 2019   Normal LVF, negative monitor   Type II or unspecified type diabetes mellitus without mention of complication, not stated as uncontrolled    Unspecified essential hypertension    mild LVH, normal LVF, grade 2 DD by echo 2019    Family History  Problem Relation Age of Onset   Lung cancer Brother    Diabetes Mother     Past Surgical History:  Procedure Laterality Date   APPENDECTOMY     ESOPHAGOGASTRODUODENOSCOPY (EGD) WITH PROPOFOL N/A 01/29/2021   Procedure: ESOPHAGOGASTRODUODENOSCOPY (EGD) WITH PROPOFOL;  Surgeon: Ronnette Juniper, MD;  Location[facility];  Service: Gastroenterology;  Laterality: N/A;   FIXATION KYPHOPLASTY LUMBAR SPINE     FOREIGN BODY REMOVAL  01/29/2021   Procedure: FOREIGN BODY REMOVAL;  Surgeon: Ronnette Juniper, MD;  Location: [facility];  Service: Gastroenterology;;   HIATAL HERNIA REPAIR     INTRAMEDULLARY (IM) NAIL INTERTROCHANTERIC Left 01/21/2021   Procedure: INTRAMEDULLARY (IM) NAIL INTERTROCHANTRIC;  Surgeon: Leandrew Koyanagi, MD;  Location: [facility];  Service: Orthopedics;  Laterality: Left;   LUMBAR SPINE SURGERY     TUBAL LIGATION     VAGINAL HYSTERECTOMY     Social History   Occupational History    Comment: [employer]  Tobacco Use   Smoking status: Never   Smokeless tobacco: Never  Vaping Use   Vaping Use: Never used  Substance and Sexual Activity   Alcohol use: Never   Drug use: Never   Sexual activity: Not Currently

## 2021-05-03 MED ORDER — METHYLPREDNISOLONE ACETATE 40 MG/ML IJ SUSP: 40.0000 mg | INTRAMUSCULAR | Status: DC | PRN

## 2021-05-03 MED ORDER — BUPIVACAINE HCL 0.25 % IJ SOLN: 2.0000 mL | INTRAMUSCULAR | Status: DC | PRN

## 2021-05-03 MED ORDER — LIDOCAINE HCL 1 % IJ SOLN: 2.0000 mL | INTRAMUSCULAR | Status: DC | PRN

## 2021-05-03 NOTE — Telephone Encounter (Signed)
Noted  

## 2021-05-03 NOTE — Addendum Note (Signed)
Addended by: Azucena Cecil on: 05/03/2021 05:06 PM   Modules accepted: Level of Service

## 2021-06-13 ENCOUNTER — Telehealth: Payer: Self-pay

## 2021-06-13 NOTE — Telephone Encounter (Signed)
BV pending for Durolane, left knee. ? ?

## 2021-06-23 ENCOUNTER — Telehealth: Payer: Self-pay

## 2021-06-23 NOTE — Telephone Encounter (Signed)
Patient will CB when ready to have gel injection. ? ?Approved for Durolane, left knee. ?[place] ?Patient will be responsible for 20% OOP. ?Co-pay of $45.00 ?No PA required ?

## 2021-08-24 ENCOUNTER — Telehealth: Payer: Self-pay

## 2021-08-24 NOTE — Telephone Encounter (Signed)
Attempted to contact patient's daughter Lisa to schedule a Palliative Care consult appointment. No answer left a message to return call. 

## 2021-08-24 NOTE — Telephone Encounter (Signed)
Spoke with patient's daughter [name] and scheduled a Mychart Palliative Consult for 08/28/21 @ 2 PM.   Consent obtained; updated Netsmart, Team List and Epic.

## 2021-08-28 ENCOUNTER — Other Ambulatory Visit: Payer: Medicare Other | Admitting: Hospice

## 2021-08-28 DIAGNOSIS — J4530 Mild persistent asthma, uncomplicated: Secondary | ICD-10-CM

## 2021-08-28 DIAGNOSIS — Z515 Encounter for palliative care: Secondary | ICD-10-CM

## 2021-08-28 DIAGNOSIS — F0390 Unspecified dementia without behavioral disturbance: Secondary | ICD-10-CM

## 2021-08-28 DIAGNOSIS — I82401 Acute embolism and thrombosis of unspecified deep veins of right lower extremity: Secondary | ICD-10-CM

## 2021-08-28 DIAGNOSIS — I10 Essential (primary) hypertension: Secondary | ICD-10-CM

## 2021-08-28 NOTE — Progress Notes (Signed)
[facility] Palliative Care Consult Note Telephone: [phone]  Fax: [phone]  PATIENT NAME: [name] [name] [address] [place] [address] [phone] (home)  DOB: Aug 31, 1929 MRN: [id number]  PRIMARY CARE PROVIDER:    Ginger Organ., MD,  [address] [place] [address] [phone]  REFERRING PROVIDER:   Ginger Organ., MD [address] [place],   [address] [phone]  RESPONSIBLE PARTY:    Lisa/Eric [phone] [place]     Name Relation Home Work [place] Daughter [phone]  [phone]       TELEHEALTH VISIT STATEMENT Due to the COVID-19 crisis, this visit was done via telemedicine from my office and it was initiated and consent by this patient and or family.  I connected with patient OR PROXY by a telephone/video  and verified that I am speaking with the correct person. I discussed the limitations of evaluation and management by telemedicine. The patient expressed understanding and agreed to proceed. Palliative Care was asked to follow this patient to address advance care planning, complex medical decision making and goals of care clarification. [name] [name] and [name] [name] are with patient during visit. This is the initial visit.     ASSESSMENT AND / RECOMMENDATIONS:   Advance Care Planning: Our advance care planning conversation included a discussion about:    The value and importance of advance care planning  Difference between Hospice and Palliative care Exploration of goals of care in the event of a sudden injury or illness  Identification and preparation of a healthcare agent  Review and updating or creation of an  advance directive document . Decision not to resuscitate or to de-escalate disease focused treatments due to poor prognosis.  CODE STATUS: Patient is a Do Not Resuscitate.   Goals of Care: Goals include to maximize quality of life and symptom  management. Patient/family is open to hospice service in the future.   I spent  16 minutes providing this initial consultation. More than 50% of the time in this consultation was spent on counseling patient and coordinating communication. --------------------------------------------------------------------------------------------------------------------------------------  Symptom Management/Plan: Dementia: worsening memory loss/confusion in line with Dementia disease trajectory. Incontinent of bowel and bladder FAST 6C, fairly independent bathing, self grooming, walks with a rolling walker. Fall and safety precautions.  Asthma: No recent exacerbation in over a year.  Use Albuterol as needed. Slow deep breathing. CKD stage IV: Avoid nephrotoxic substances/medications. No plan for dialysis in the future.  HTN: Managed with Metoprolol, Clonidine, Telmisartan.  DVT: Managed with Eliquis. Routine CBC CMP  Follow up: Palliative care will continue to follow for complex medical decision making, advance care planning, and clarification of goals. Return 6 weeks or prn. Encouraged to call provider sooner with any concerns.   Family /Caregiver/Community Supports: Patient lives at home [name] [name] and [name] [name] take turns staying with her. Strong family support system identified.   HOSPICE ELIGIBILITY/DIAGNOSIS: TBD  Chief Complaint: Initial Palliative care visit  HISTORY OF PRESENT ILLNESS:  RAINI [name] is a 88 y.o. year old female  with multiple morbidities requiring close monitoring and with high risk of complications and  mortality: Alzheimer dementia, A-fib, DVT, asthma, stage IV CKD.  History obtained from review of EMR, discussion with primary team, caregiver, family and/or [name] [name].  Review and summarization of Epic records shows history from other than patient. Rest of 10 point ROS asked and negative.  Independent interpretation of tests and reviewed as needed, available labs, patient  records,  imaging, studies and related documents from the EMR.   PAST MEDICAL HISTORY:  Active Ambulatory Problems    Diagnosis Date Noted   DM2 (diabetes mellitus, type 2) ([place]) 03/31/2007   OBESITY 03/31/2007   Venous insufficiency (chronic) (peripheral) 03/31/2007   Asthmatic bronchitis , chronic ([place]) 03/31/2007   Seasonal and perennial allergic rhinitis 03/31/2007   Asthma, mild persistent 03/31/2007   G E R D 03/31/2007   RENAL INSUFFICIENCY 09/29/2007   Obstructive sleep apnea 02/08/2011   Acute frontal sinusitis 09/20/2013   Hyperlipidemia 08/28/2017   History of syncope 08/28/2017   Sleep apnea    Essential hypertension    Renal insufficiency 06/18/2019   CKD (chronic kidney disease) stage 4, GFR 15-29 ml/min (HCC) 01/20/2021   Closed intertrochanteric fracture of hip, left, sequela 01/20/2021   Alzheimer's dementia ([place]) 01/20/2021   Food impaction of esophagus 01/29/2021   UTI (urinary tract infection) 33/82/5053   Acute metabolic encephalopathy 97/67/3419   Resolved Ambulatory Problems    Diagnosis Date Noted   No Resolved Ambulatory Problems   Past Medical History:  Diagnosis Date   Allergic rhinitis, cause unspecified    Asthma    Syncope 2019   Type II or unspecified type diabetes mellitus without mention of complication, not stated as uncontrolled    Unspecified essential hypertension     SOCIAL HX:  Social History   Tobacco Use   Smoking status: Never   Smokeless tobacco: Never  Substance Use Topics   Alcohol use: Never     FAMILY HX:  Family History  Problem Relation Age of Onset   Lung cancer Brother    Diabetes Mother       ALLERGIES:  Allergies  Allergen Reactions   Aspirin Nausea And Vomiting   Codeine Nausea And Vomiting   Ibuprofen Nausea And Vomiting   Meperidine Hcl    Tape     ADHESIVE TAPE CAUSES BLISTERS   Augmentin [Amoxicillin-Pot Clavulanate]     GI upset      PERTINENT MEDICATIONS:  Outpatient Encounter Medications as of  08/28/2021  Medication Sig   albuterol (PROAIR HFA) 108 (90 Base) MCG/ACT inhaler 2 puffs every 4-6 hours as directed- rescue   cefadroxil (DURICEF) 500 MG capsule Take 1 capsule (500 mg total) by mouth 2 (two) times daily.   ELIQUIS 2.5 MG TABS tablet Take 1 tablet (2.5 mg total) by mouth 2 (two) times daily.   furosemide (LASIX) 20 MG tablet Take 1 tablet (20 mg total) by mouth daily. as needed for edema   hydrALAZINE (APRESOLINE) 25 MG tablet Take 1 tablet (25 mg total) by mouth 3 (three) times daily. as needed for SBP greater thn or equal to 140   Insulin Glargine (BASAGLAR KWIKPEN) 100 UNIT/ML Inject 10 Units into the skin daily. Give 10 units sub q at bedtime for DM (Patient taking differently: Inject 10 Units into the skin at bedtime.)   loratadine (CLARITIN) 10 MG tablet Take 10 mg by mouth daily as needed for allergies.   metoprolol succinate (TOPROL-XL) 25 MG 24 hr tablet Take 1 tablet (25 mg total) by mouth in the morning and at bedtime.   mirtazapine (REMERON) 7.5 MG tablet Take 1 tablet (7.5 mg total) by mouth at bedtime.   Nutritional Supplements (NUTRITIONAL SUPPLEMENT PO) Take by mouth daily. Magic Cup.   polyethylene glycol (MIRALAX / GLYCOLAX) 17 g packet Take 17 g by mouth daily as needed for mild constipation.   PROTONIX 40 MG tablet Take 1  tablet (40 mg total) by mouth 2 (two) times daily before a meal for 28 days, THEN 1 tablet (40 mg total) 2 (two) times daily for 28 days. (Patient taking differently: Take 1 tablet (40 mg total) by mouth 2 (two) times daily before meals for GERD)   senna-docusate (SENOKOT-S) 8.6-50 MG tablet Take 1 tablet by mouth 2 (two) times daily.   simvastatin (ZOCOR) 40 MG tablet Take 1 tablet (40 mg total) by mouth every evening.   No facility-administered encounter medications on file as of 08/28/2021.      Thank you for the opportunity to participate in the care of Ms. Boroff.  The palliative care team will continue to follow. Please call our  office at [phone] if we can be of additional assistance.   Note: Portions of this note were generated with L[employer]ist. Dictation errors may occur despite best attempts at proofreading.  Teodoro Spray, NP

## 2021-10-02 ENCOUNTER — Telehealth: Payer: Medicare Other | Admitting: Hospice

## 2021-10-09 ENCOUNTER — Telehealth: Payer: Medicare Other | Admitting: Hospice

## 2021-10-09 DIAGNOSIS — N184 Chronic kidney disease, stage 4 (severe): Secondary | ICD-10-CM

## 2021-10-09 DIAGNOSIS — F03918 Unspecified dementia, unspecified severity, with other behavioral disturbance: Secondary | ICD-10-CM

## 2021-10-09 DIAGNOSIS — Z515 Encounter for palliative care: Secondary | ICD-10-CM

## 2021-10-09 DIAGNOSIS — R451 Restlessness and agitation: Secondary | ICD-10-CM

## 2021-10-09 DIAGNOSIS — I10 Essential (primary) hypertension: Secondary | ICD-10-CM

## 2021-10-09 NOTE — Progress Notes (Signed)
[facility] Palliative Care Consult Note Telephone: [phone]  Fax: [phone]  PATIENT NAME: [name] [name] [address] [place] [address] [phone] (home)  DOB: [birth year] MRN: [id number]  PRIMARY CARE PROVIDER:    Ginger Organ., MD,  [address] [place] [address] [phone]  REFERRING PROVIDER:   Ginger Organ., MD [address] [place],  [place] [address] [phone]  RESPONSIBLE PARTY:    Lisa/Eric [phone] [place]     Name Relation Home Work [place] Daughter [phone]  [phone]       TELEHEALTH VISIT STATEMENT Due to the COVID-19 crisis, this visit was done via telemedicine from my office and it was initiated and consent by this patient and or family.  I connected with patient OR PROXY by a telephone/video  and verified that I am speaking with the correct person. I discussed the limitations of evaluation and management by telemedicine. The patient expressed understanding and agreed to proceed. Palliative Care was asked to follow this patient to address advance care planning, complex medical decision making and goals of care clarification.     ASSESSMENT AND / RECOMMENDATIONS:   CODE STATUS: Patient is a Do Not Resuscitate.   Goals of Care: Goals include to maximize quality of life and symptom management. Patient/family is open to hospice service in the future.   Symptom Management/Plan: Agitation: related to Dementia. [name] reports patient did not tolerate Namenda and Aricept well - diarrhea, aches. Deescalation techniques discussed. Recommendation: Seroquel 25 mg daily at night time. May titrate up on frequency and strength as needed.  Dementia: worsening memory loss/confusion in line with Dementia disease trajectory. Incontinent of bowel and bladder FAST 6C, fairly independent bathing, self grooming, walks with a rolling walker. Fall and safety  precautions.  Asthma: Use Albuterol as needed. Slow deep breathing. No exacerbation CKD stage IV: Avoid nephrotoxic substances/medications. No plan for dialysis in the future.  HTN: Managed with Metoprolol, Clonidine, Telmisartan.  DVT: Managed with Eliquis. Routine CBC CMP  Follow up: Palliative care will continue to follow for complex medical decision making, advance care planning, and clarification of goals. Return 6 weeks or prn. Encouraged to call provider sooner with any concerns.   Family /Caregiver/Community Supports: Patient lives at home [name] and Randall Hiss take turns staying with her. Strong family support system identified.   HOSPICE ELIGIBILITY/DIAGNOSIS: TBD  Chief Complaint: F/u visit  HISTORY OF PRESENT ILLNESS:  [name] [name] is a 88 y.o. year old female  with multiple morbidities requiring close monitoring and with high risk of complications and  mortality: Alzheimer dementia, Agitation, A-fib, DVT, asthma, stage IV CKD.  History obtained from review of EMR, discussion with primary team, caregiver, family and/or Ms. Gritton.  Review and summarization of Epic records shows history from other than patient. Rest of 10 point ROS asked and negative.  Independent interpretation of tests and reviewed as needed, available labs, patient records, imaging, studies and related documents from the EMR.   PAST MEDICAL HISTORY:  Active Ambulatory Problems    Diagnosis Date Noted   DM2 (diabetes mellitus, type 2) ([facility]) 03/31/2007   OBESITY 03/31/2007   Venous insufficiency (chronic) (peripheral) 03/31/2007   Asthmatic bronchitis , chronic (HCC) 03/31/2007   Seasonal and perennial allergic rhinitis 03/31/2007   Asthma, mild persistent 03/31/2007   G E R D 03/31/2007   RENAL INSUFFICIENCY 09/29/2007   Obstructive sleep apnea 02/08/2011   Acute frontal sinusitis 09/20/2013  Hyperlipidemia 08/28/2017   History of syncope 08/28/2017   Sleep apnea    Essential hypertension    Renal  insufficiency 06/18/2019   CKD (chronic kidney disease) stage 4, GFR 15-29 ml/min (HCC) 01/20/2021   Closed intertrochanteric fracture of hip, left, sequela 01/20/2021   Alzheimer's dementia ([facility]) 01/20/2021   Food impaction of esophagus 01/29/2021   UTI (urinary tract infection) 13/10/6576   Acute metabolic encephalopathy 46/96/2952   Resolved Ambulatory Problems    Diagnosis Date Noted   No Resolved Ambulatory Problems   Past Medical History:  Diagnosis Date   Allergic rhinitis, cause unspecified    Asthma    Syncope 2019   Type II or unspecified type diabetes mellitus without mention of complication, not stated as uncontrolled    Unspecified essential hypertension     SOCIAL HX:  Social History   Tobacco Use   Smoking status: Never   Smokeless tobacco: Never  Substance Use Topics   Alcohol use: Never     FAMILY HX:  Family History  Problem Relation Age of Onset   Lung cancer Brother    Diabetes Mother       ALLERGIES:  Allergies  Allergen Reactions   Aspirin Nausea And Vomiting   Codeine Nausea And Vomiting   Ibuprofen Nausea And Vomiting   Meperidine Hcl    Tape     ADHESIVE TAPE CAUSES BLISTERS   Augmentin [Amoxicillin-Pot Clavulanate]     GI upset      PERTINENT MEDICATIONS:  Outpatient Encounter Medications as of 10/09/2021  Medication Sig   albuterol (PROAIR HFA) 108 (90 Base) MCG/ACT inhaler 2 puffs every 4-6 hours as directed- rescue   cefadroxil (DURICEF) 500 MG capsule Take 1 capsule (500 mg total) by mouth 2 (two) times daily.   ELIQUIS 2.5 MG TABS tablet Take 1 tablet (2.5 mg total) by mouth 2 (two) times daily.   furosemide (LASIX) 20 MG tablet Take 1 tablet (20 mg total) by mouth daily. as needed for edema   hydrALAZINE (APRESOLINE) 25 MG tablet Take 1 tablet (25 mg total) by mouth 3 (three) times daily. as needed for SBP greater thn or equal to 140   Insulin Glargine (BASAGLAR KWIKPEN) 100 UNIT/ML Inject 10 Units into the skin daily. Give 10  units sub q at bedtime for DM (Patient taking differently: Inject 10 Units into the skin at bedtime.)   loratadine (CLARITIN) 10 MG tablet Take 10 mg by mouth daily as needed for allergies.   metoprolol succinate (TOPROL-XL) 25 MG 24 hr tablet Take 1 tablet (25 mg total) by mouth in the morning and at bedtime.   mirtazapine (REMERON) 7.5 MG tablet Take 1 tablet (7.5 mg total) by mouth at bedtime.   Nutritional Supplements (NUTRITIONAL SUPPLEMENT PO) Take by mouth daily. Magic Cup.   polyethylene glycol (MIRALAX / GLYCOLAX) 17 g packet Take 17 g by mouth daily as needed for mild constipation.   PROTONIX 40 MG tablet Take 1 tablet (40 mg total) by mouth 2 (two) times daily before a meal for 28 days, THEN 1 tablet (40 mg total) 2 (two) times daily for 28 days. (Patient taking differently: Take 1 tablet (40 mg total) by mouth 2 (two) times daily before meals for GERD)   senna-docusate (SENOKOT-S) 8.6-50 MG tablet Take 1 tablet by mouth 2 (two) times daily.   simvastatin (ZOCOR) 40 MG tablet Take 1 tablet (40 mg total) by mouth every evening.   No facility-administered encounter medications on file as of 10/09/2021.  I spent  40 minutes providing this consultation; this includes time spent with patient/family, chart review and documentation. More than 50% of the time in this consultation was spent on counseling and coordinating communication   Thank you for the opportunity to participate in the care of Ms. Boggio.  The palliative care team will continue to follow. Please call our office at [phone] if we can be of additional assistance.   Note: Portions of this note were generated with [employer]. Dictation errors may occur despite best attempts at proofreading.  Teodoro Spray, NP

## 2022-02-12 ENCOUNTER — Other Ambulatory Visit: Payer: Self-pay

## 2022-02-12 ENCOUNTER — Encounter (HOSPITAL_COMMUNITY): Payer: Self-pay

## 2022-02-12 ENCOUNTER — Emergency Department (HOSPITAL_COMMUNITY): Payer: Medicare Other

## 2022-02-12 ENCOUNTER — Observation Stay (HOSPITAL_COMMUNITY): Payer: Medicare Other

## 2022-02-12 ENCOUNTER — Inpatient Hospital Stay (HOSPITAL_COMMUNITY)
Admission: EM | Admit: 2022-02-12 | Discharge: 2022-02-19 | DRG: 291 | Disposition: A | Discharge status: Home Health | Payer: Medicare Other | Attending: Internal Medicine | Admitting: Internal Medicine

## 2022-02-12 DIAGNOSIS — I2510 Atherosclerotic heart disease of native coronary artery without angina pectoris: Secondary | ICD-10-CM | POA: Diagnosis present

## 2022-02-12 DIAGNOSIS — J9601 Acute respiratory failure with hypoxia: Secondary | ICD-10-CM | POA: Diagnosis present

## 2022-02-12 DIAGNOSIS — J4530 Mild persistent asthma, uncomplicated: Secondary | ICD-10-CM | POA: Diagnosis present

## 2022-02-12 DIAGNOSIS — I509 Heart failure, unspecified: Secondary | ICD-10-CM

## 2022-02-12 DIAGNOSIS — D631 Anemia in chronic kidney disease: Secondary | ICD-10-CM | POA: Diagnosis present

## 2022-02-12 DIAGNOSIS — J4531 Mild persistent asthma with (acute) exacerbation: Secondary | ICD-10-CM | POA: Diagnosis present

## 2022-02-12 DIAGNOSIS — Z7901 Long term (current) use of anticoagulants: Secondary | ICD-10-CM

## 2022-02-12 DIAGNOSIS — Z833 Family history of diabetes mellitus: Secondary | ICD-10-CM

## 2022-02-12 DIAGNOSIS — Z86718 Personal history of other venous thrombosis and embolism: Secondary | ICD-10-CM

## 2022-02-12 DIAGNOSIS — Z1152 Encounter for screening for COVID-19: Secondary | ICD-10-CM

## 2022-02-12 DIAGNOSIS — J440 Chronic obstructive pulmonary disease with acute lower respiratory infection: Secondary | ICD-10-CM | POA: Diagnosis present

## 2022-02-12 DIAGNOSIS — J449 Chronic obstructive pulmonary disease, unspecified: Secondary | ICD-10-CM | POA: Diagnosis present

## 2022-02-12 DIAGNOSIS — Z885 Allergy status to narcotic agent status: Secondary | ICD-10-CM

## 2022-02-12 DIAGNOSIS — N184 Chronic kidney disease, stage 4 (severe): Secondary | ICD-10-CM | POA: Diagnosis present

## 2022-02-12 DIAGNOSIS — Z515 Encounter for palliative care: Secondary | ICD-10-CM

## 2022-02-12 DIAGNOSIS — G4733 Obstructive sleep apnea (adult) (pediatric): Secondary | ICD-10-CM | POA: Diagnosis present

## 2022-02-12 DIAGNOSIS — F0283 Dementia in other diseases classified elsewhere, unspecified severity, with mood disturbance: Secondary | ICD-10-CM | POA: Diagnosis present

## 2022-02-12 DIAGNOSIS — I5033 Acute on chronic diastolic (congestive) heart failure: Secondary | ICD-10-CM | POA: Diagnosis present

## 2022-02-12 DIAGNOSIS — J441 Chronic obstructive pulmonary disease with (acute) exacerbation: Secondary | ICD-10-CM | POA: Diagnosis present

## 2022-02-12 DIAGNOSIS — E785 Hyperlipidemia, unspecified: Secondary | ICD-10-CM | POA: Diagnosis present

## 2022-02-12 DIAGNOSIS — I4510 Unspecified right bundle-branch block: Secondary | ICD-10-CM | POA: Diagnosis present

## 2022-02-12 DIAGNOSIS — Z79899 Other long term (current) drug therapy: Secondary | ICD-10-CM

## 2022-02-12 DIAGNOSIS — Z66 Do not resuscitate: Secondary | ICD-10-CM | POA: Diagnosis present

## 2022-02-12 DIAGNOSIS — E1122 Type 2 diabetes mellitus with diabetic chronic kidney disease: Secondary | ICD-10-CM | POA: Diagnosis present

## 2022-02-12 DIAGNOSIS — J209 Acute bronchitis, unspecified: Secondary | ICD-10-CM | POA: Diagnosis present

## 2022-02-12 DIAGNOSIS — G309 Alzheimer's disease, unspecified: Secondary | ICD-10-CM | POA: Diagnosis present

## 2022-02-12 DIAGNOSIS — N179 Acute kidney failure, unspecified: Secondary | ICD-10-CM | POA: Diagnosis present

## 2022-02-12 DIAGNOSIS — Z6833 Body mass index (BMI) 33.0-33.9, adult: Secondary | ICD-10-CM

## 2022-02-12 DIAGNOSIS — Z634 Disappearance and death of family member: Secondary | ICD-10-CM

## 2022-02-12 DIAGNOSIS — F05 Delirium due to known physiological condition: Secondary | ICD-10-CM | POA: Diagnosis present

## 2022-02-12 DIAGNOSIS — I480 Paroxysmal atrial fibrillation: Secondary | ICD-10-CM | POA: Diagnosis present

## 2022-02-12 DIAGNOSIS — Z888 Allergy status to other drugs, medicaments and biological substances status: Secondary | ICD-10-CM

## 2022-02-12 DIAGNOSIS — F0390 Unspecified dementia without behavioral disturbance: Secondary | ICD-10-CM | POA: Diagnosis present

## 2022-02-12 DIAGNOSIS — I161 Hypertensive emergency: Secondary | ICD-10-CM | POA: Diagnosis present

## 2022-02-12 DIAGNOSIS — Z87440 Personal history of urinary (tract) infections: Secondary | ICD-10-CM

## 2022-02-12 DIAGNOSIS — I5032 Chronic diastolic (congestive) heart failure: Secondary | ICD-10-CM | POA: Diagnosis present

## 2022-02-12 DIAGNOSIS — E1169 Type 2 diabetes mellitus with other specified complication: Secondary | ICD-10-CM | POA: Diagnosis present

## 2022-02-12 DIAGNOSIS — I130 Hypertensive heart and chronic kidney disease with heart failure and stage 1 through stage 4 chronic kidney disease, or unspecified chronic kidney disease: Principal | ICD-10-CM | POA: Diagnosis present

## 2022-02-12 DIAGNOSIS — G4730 Sleep apnea, unspecified: Secondary | ICD-10-CM | POA: Diagnosis present

## 2022-02-12 DIAGNOSIS — F32A Depression, unspecified: Secondary | ICD-10-CM | POA: Diagnosis present

## 2022-02-12 DIAGNOSIS — I10 Essential (primary) hypertension: Secondary | ICD-10-CM | POA: Diagnosis present

## 2022-02-12 DIAGNOSIS — E669 Obesity, unspecified: Secondary | ICD-10-CM | POA: Diagnosis present

## 2022-02-12 DIAGNOSIS — Z801 Family history of malignant neoplasm of trachea, bronchus and lung: Secondary | ICD-10-CM

## 2022-02-12 DIAGNOSIS — E8770 Fluid overload, unspecified: Secondary | ICD-10-CM

## 2022-02-12 DIAGNOSIS — Z794 Long term (current) use of insulin: Secondary | ICD-10-CM

## 2022-02-12 DIAGNOSIS — E1165 Type 2 diabetes mellitus with hyperglycemia: Secondary | ICD-10-CM | POA: Diagnosis present

## 2022-02-12 LAB — BASIC METABOLIC PANEL
Anion gap: 5 (ref 5–15)
BUN: 28 mg/dL — ABNORMAL HIGH (ref 8–23)
CO2: 19 mmol/L — ABNORMAL LOW (ref 22–32)
Calcium: 9.8 mg/dL (ref 8.9–10.3)
Chloride: 114 mmol/L — ABNORMAL HIGH (ref 98–111)
Creatinine, Ser: 2.13 mg/dL — ABNORMAL HIGH (ref 0.44–1.00)
GFR, Estimated: 22 mL/min — ABNORMAL LOW (ref >60)
Glucose, Bld: 174 mg/dL — ABNORMAL HIGH (ref 70–99)
Potassium: 4.2 mmol/L (ref 3.5–5.1)
Sodium: 138 mmol/L (ref 135–145)

## 2022-02-12 LAB — RETICULOCYTES
Immature Retic Fract: 13.7 % (ref 2.3–15.9)
RBC.: 3.21 MIL/uL — ABNORMAL LOW (ref 3.87–5.11)
Retic Count, Absolute: 57.8 10*3/uL (ref 19.0–186.0)
Retic Ct Pct: 1.8 % (ref 0.4–3.1)

## 2022-02-12 LAB — CBC WITH DIFFERENTIAL/PLATELET
Abs Immature Granulocytes: 0.35 10*3/uL — ABNORMAL HIGH (ref 0.00–0.07)
Basophils Absolute: 0 10*3/uL (ref 0.0–0.1)
Basophils Relative: 0 %
Eosinophils Absolute: 0 10*3/uL (ref 0.0–0.5)
Eosinophils Relative: 0 %
HCT: 32.3 % — ABNORMAL LOW (ref 36.0–46.0)
Hemoglobin: 10.2 g/dL — ABNORMAL LOW (ref 12.0–15.0)
Immature Granulocytes: 10 %
Lymphocytes Relative: 19 %
Lymphs Abs: 0.7 10*3/uL (ref 0.7–4.0)
MCH: 30.8 pg (ref 26.0–34.0)
MCHC: 31.6 g/dL (ref 30.0–36.0)
MCV: 97.6 fL (ref 80.0–100.0)
Monocytes Absolute: 0.2 10*3/uL (ref 0.1–1.0)
Monocytes Relative: 6 %
Neutro Abs: 2.3 10*3/uL (ref 1.7–7.7)
Neutrophils Relative %: 65 %
Platelets: 128 10*3/uL — ABNORMAL LOW (ref 150–400)
RBC: 3.31 MIL/uL — ABNORMAL LOW (ref 3.87–5.11)
RDW: 12.7 % (ref 11.5–15.5)
WBC: 3.6 10*3/uL — ABNORMAL LOW (ref 4.0–10.5)
nRBC: 0 % (ref 0.0–0.2)

## 2022-02-12 LAB — URINALYSIS, ROUTINE W REFLEX MICROSCOPIC
Bilirubin Urine: NEGATIVE
Glucose, UA: NEGATIVE mg/dL
Hgb urine dipstick: NEGATIVE
Ketones, ur: NEGATIVE mg/dL
Nitrite: NEGATIVE
Protein, ur: 100 mg/dL — AB
Specific Gravity, Urine: 1.011 (ref 1.005–1.030)
pH: 7 (ref 5.0–8.0)

## 2022-02-12 LAB — PROCALCITONIN: Procalcitonin: <0.1 ng/mL

## 2022-02-12 LAB — I-STAT VENOUS BLOOD GAS, ED
Acid-base deficit: 5 mmol/L — ABNORMAL HIGH (ref 0.0–2.0)
Bicarbonate: 18.9 mmol/L — ABNORMAL LOW (ref 20.0–28.0)
Calcium, Ion: 1.41 mmol/L — ABNORMAL HIGH (ref 1.15–1.40)
HCT: 30 % — ABNORMAL LOW (ref 36.0–46.0)
Hemoglobin: 10.2 g/dL — ABNORMAL LOW (ref 12.0–15.0)
O2 Saturation: 99 %
Potassium: 4.3 mmol/L (ref 3.5–5.1)
Sodium: 141 mmol/L (ref 135–145)
TCO2: 20 mmol/L — ABNORMAL LOW (ref 22–32)
pCO2, Ven: 31.8 mmHg — ABNORMAL LOW (ref 44–60)
pH, Ven: 7.382 (ref 7.25–7.43)
pO2, Ven: 130 mmHg — ABNORMAL HIGH (ref 32–45)

## 2022-02-12 LAB — TROPONIN I (HIGH SENSITIVITY)
Troponin I (High Sensitivity): 20 ng/L — ABNORMAL HIGH (ref <18)
Troponin I (High Sensitivity): 19 ng/L — ABNORMAL HIGH (ref <18)

## 2022-02-12 LAB — IRON AND TIBC
Iron: 38 ug/dL (ref 28–170)
Saturation Ratios: 13 % (ref 10.4–31.8)
TIBC: 298 ug/dL (ref 250–450)
UIBC: 260 ug/dL

## 2022-02-12 LAB — GLUCOSE, CAPILLARY: Glucose-Capillary: 191 mg/dL — ABNORMAL HIGH (ref 70–99)

## 2022-02-12 LAB — RESP PANEL BY RT-PCR (FLU A&B, COVID) ARPGX2
Influenza A by PCR: NEGATIVE
Influenza B by PCR: NEGATIVE
SARS Coronavirus 2 by RT PCR: NEGATIVE

## 2022-02-12 LAB — BRAIN NATRIURETIC PEPTIDE: B Natriuretic Peptide: 1045.2 pg/mL — ABNORMAL HIGH (ref 0.0–100.0)

## 2022-02-12 LAB — CBG MONITORING, ED: Glucose-Capillary: 223 mg/dL — ABNORMAL HIGH (ref 70–99)

## 2022-02-12 MED ORDER — BASAGLAR KWIKPEN 100 UNIT/ML ~~LOC~~ SOPN: 10.0000 [IU] | PEN_INJECTOR | Freq: Every day | SUBCUTANEOUS | Status: DC

## 2022-02-12 MED ORDER — HYDRALAZINE HCL 25 MG PO TABS
25.0000 mg | ORAL_TABLET | Freq: Three times a day (TID) | ORAL | Status: DC
  Administered 2022-02-12: 25 mg via ORAL
  Filled 2022-02-12: qty 1

## 2022-02-12 MED ORDER — PREDNISONE 20 MG PO TABS
20.0000 mg | ORAL_TABLET | Freq: Every day | ORAL | Status: DC
  Administered 2022-02-12 – 2022-02-13 (×2): 20 mg via ORAL
  Filled 2022-02-12 (×2): qty 1

## 2022-02-12 MED ORDER — OLANZAPINE 10 MG IM SOLR
5.0000 mg | Freq: Once | INTRAMUSCULAR | Status: DC | PRN
  Filled 2022-02-12: qty 10

## 2022-02-12 MED ORDER — PANTOPRAZOLE SODIUM 40 MG PO TBEC: 40.0000 mg | DELAYED_RELEASE_TABLET | Freq: Every day | ORAL | Status: DC

## 2022-02-12 MED ORDER — INSULIN GLARGINE-YFGN 100 UNIT/ML ~~LOC~~ SOLN
10.0000 [IU] | Freq: Every day | SUBCUTANEOUS | Status: DC
  Administered 2022-02-13 – 2022-02-14 (×2): 10 [IU] via SUBCUTANEOUS
  Filled 2022-02-12 (×3): qty 0.1

## 2022-02-12 MED ORDER — SODIUM CHLORIDE 0.9 % IV SOLN: 250.0000 mL | INTRAVENOUS | Status: DC | PRN

## 2022-02-12 MED ORDER — DOXYCYCLINE HYCLATE 100 MG PO TABS
100.0000 mg | ORAL_TABLET | Freq: Two times a day (BID) | ORAL | Status: DC
  Administered 2022-02-12 – 2022-02-13 (×3): 100 mg via ORAL
  Filled 2022-02-12 (×3): qty 1

## 2022-02-12 MED ORDER — ISOSORBIDE MONONITRATE ER 30 MG PO TB24
30.0000 mg | ORAL_TABLET | Freq: Every day | ORAL | Status: DC
  Administered 2022-02-12 – 2022-02-14 (×3): 30 mg via ORAL
  Filled 2022-02-12 (×3): qty 1

## 2022-02-12 MED ORDER — FUROSEMIDE 40 MG PO TABS
40.0000 mg | ORAL_TABLET | Freq: Every day | ORAL | Status: DC
  Administered 2022-02-13: 40 mg via ORAL
  Filled 2022-02-12: qty 1

## 2022-02-12 MED ORDER — SENNOSIDES-DOCUSATE SODIUM 8.6-50 MG PO TABS
1.0000 | ORAL_TABLET | Freq: Two times a day (BID) | ORAL | Status: DC
  Administered 2022-02-12 – 2022-02-19 (×13): 1 via ORAL
  Filled 2022-02-12 (×15): qty 1

## 2022-02-12 MED ORDER — BUDESONIDE 0.25 MG/2ML IN SUSP
0.2500 mg | Freq: Two times a day (BID) | RESPIRATORY_TRACT | Status: DC
  Administered 2022-02-12 – 2022-02-13 (×2): 0.25 mg via RESPIRATORY_TRACT
  Filled 2022-02-12 (×2): qty 2

## 2022-02-12 MED ORDER — HYDRALAZINE HCL 20 MG/ML IJ SOLN
10.0000 mg | Freq: Once | INTRAMUSCULAR | Status: AC
  Administered 2022-02-12: 10 mg via INTRAVENOUS
  Filled 2022-02-12: qty 1

## 2022-02-12 MED ORDER — IPRATROPIUM-ALBUTEROL 0.5-2.5 (3) MG/3ML IN SOLN
3.0000 mL | Freq: Once | RESPIRATORY_TRACT | Status: AC
  Administered 2022-02-12: 3 mL via RESPIRATORY_TRACT
  Filled 2022-02-12: qty 3

## 2022-02-12 MED ORDER — BUDESONIDE 0.25 MG/2ML IN SUSP: 0.2500 mg | Freq: Two times a day (BID) | RESPIRATORY_TRACT | Status: DC

## 2022-02-12 MED ORDER — ACETAMINOPHEN 500 MG PO TABS
500.0000 mg | ORAL_TABLET | Freq: Two times a day (BID) | ORAL | Status: DC
  Administered 2022-02-12 – 2022-02-19 (×15): 500 mg via ORAL
  Filled 2022-02-12 (×15): qty 1

## 2022-02-12 MED ORDER — SODIUM CHLORIDE 0.9% FLUSH: 3.0000 mL | INTRAVENOUS | Status: DC | PRN

## 2022-02-12 MED ORDER — FUROSEMIDE 10 MG/ML IJ SOLN
40.0000 mg | Freq: Once | INTRAMUSCULAR | Status: AC
  Administered 2022-02-12: 40 mg via INTRAVENOUS
  Filled 2022-02-12: qty 4

## 2022-02-12 MED ORDER — ACETAMINOPHEN 325 MG PO TABS: 650.0000 mg | ORAL_TABLET | ORAL | Status: DC | PRN

## 2022-02-12 MED ORDER — ALBUTEROL SULFATE (2.5 MG/3ML) 0.083% IN NEBU: 2.5000 mg | INHALATION_SOLUTION | Freq: Four times a day (QID) | RESPIRATORY_TRACT | Status: DC | PRN

## 2022-02-12 MED ORDER — ALBUTEROL SULFATE HFA 108 (90 BASE) MCG/ACT IN AERS: 2.0000 | INHALATION_SPRAY | Freq: Four times a day (QID) | RESPIRATORY_TRACT | Status: DC | PRN

## 2022-02-12 MED ORDER — CARVEDILOL 3.125 MG PO TABS
3.1250 mg | ORAL_TABLET | Freq: Two times a day (BID) | ORAL | Status: DC
  Administered 2022-02-12 – 2022-02-19 (×14): 3.125 mg via ORAL
  Filled 2022-02-12 (×14): qty 1

## 2022-02-12 MED ORDER — INSULIN ASPART 100 UNIT/ML IJ SOLN
0.0000 [IU] | Freq: Three times a day (TID) | INTRAMUSCULAR | Status: DC
  Administered 2022-02-12 – 2022-02-13 (×2): 5 [IU] via SUBCUTANEOUS
  Administered 2022-02-13: 2 [IU] via SUBCUTANEOUS
  Administered 2022-02-13 – 2022-02-14 (×2): 3 [IU] via SUBCUTANEOUS
  Administered 2022-02-14: 8 [IU] via SUBCUTANEOUS
  Administered 2022-02-14 – 2022-02-15 (×3): 5 [IU] via SUBCUTANEOUS
  Administered 2022-02-15: 3 [IU] via SUBCUTANEOUS
  Administered 2022-02-16: 8 [IU] via SUBCUTANEOUS
  Administered 2022-02-16: 3 [IU] via SUBCUTANEOUS
  Administered 2022-02-16: 11 [IU] via SUBCUTANEOUS
  Administered 2022-02-17: 5 [IU] via SUBCUTANEOUS
  Administered 2022-02-17 (×2): 3 [IU] via SUBCUTANEOUS
  Administered 2022-02-18 (×2): 5 [IU] via SUBCUTANEOUS
  Administered 2022-02-19: 2 [IU] via SUBCUTANEOUS
  Administered 2022-02-19: 3 [IU] via SUBCUTANEOUS

## 2022-02-12 MED ORDER — MIRTAZAPINE 15 MG PO TABS: 7.5000 mg | ORAL_TABLET | Freq: Every day | ORAL | Status: DC

## 2022-02-12 MED ORDER — HYDRALAZINE HCL 50 MG PO TABS
50.0000 mg | ORAL_TABLET | Freq: Three times a day (TID) | ORAL | Status: DC
  Administered 2022-02-12 – 2022-02-14 (×5): 50 mg via ORAL
  Filled 2022-02-12 (×5): qty 1

## 2022-02-12 MED ORDER — ONDANSETRON HCL 4 MG/2ML IJ SOLN
4.0000 mg | Freq: Four times a day (QID) | INTRAMUSCULAR | Status: DC | PRN
  Administered 2022-02-14: 4 mg via INTRAVENOUS
  Filled 2022-02-12: qty 2

## 2022-02-12 MED ORDER — HYDRALAZINE HCL 25 MG PO TABS
25.0000 mg | ORAL_TABLET | Freq: Once | ORAL | Status: AC
  Administered 2022-02-12: 25 mg via ORAL
  Filled 2022-02-12: qty 1

## 2022-02-12 MED ORDER — QUETIAPINE FUMARATE 25 MG PO TABS
12.5000 mg | ORAL_TABLET | Freq: Every day | ORAL | Status: DC
  Administered 2022-02-12 – 2022-02-18 (×7): 12.5 mg via ORAL
  Filled 2022-02-12 (×7): qty 1

## 2022-02-12 MED ORDER — LORATADINE 10 MG PO TABS: 10.0000 mg | ORAL_TABLET | Freq: Every day | ORAL | Status: DC | PRN

## 2022-02-12 MED ORDER — SIMVASTATIN 20 MG PO TABS
40.0000 mg | ORAL_TABLET | Freq: Every evening | ORAL | Status: DC
  Administered 2022-02-12 – 2022-02-18 (×7): 40 mg via ORAL
  Filled 2022-02-12 (×7): qty 2

## 2022-02-12 MED ORDER — APIXABAN 2.5 MG PO TABS
2.5000 mg | ORAL_TABLET | Freq: Two times a day (BID) | ORAL | Status: DC
  Administered 2022-02-12 – 2022-02-19 (×15): 2.5 mg via ORAL
  Filled 2022-02-12 (×15): qty 1

## 2022-02-12 MED ORDER — SODIUM CHLORIDE 0.9% FLUSH
3.0000 mL | Freq: Two times a day (BID) | INTRAVENOUS | Status: DC
  Administered 2022-02-12 – 2022-02-19 (×14): 3 mL via INTRAVENOUS

## 2022-02-12 MED ORDER — POLYETHYLENE GLYCOL 3350 17 G PO PACK: 17.0000 g | PACK | Freq: Every day | ORAL | Status: DC | PRN

## 2022-02-12 MED ORDER — GUAIFENESIN ER 600 MG PO TB12
1200.0000 mg | ORAL_TABLET | Freq: Two times a day (BID) | ORAL | Status: DC
  Administered 2022-02-12 – 2022-02-13 (×3): 1200 mg via ORAL
  Filled 2022-02-12 (×3): qty 2

## 2022-02-12 MED ORDER — BENZONATATE 100 MG PO CAPS
100.0000 mg | ORAL_CAPSULE | Freq: Three times a day (TID) | ORAL | Status: DC
  Administered 2022-02-12 – 2022-02-19 (×21): 100 mg via ORAL
  Filled 2022-02-12 (×21): qty 1

## 2022-02-12 NOTE — Progress Notes (Signed)
Pt refusing cpap for the night. ?

## 2022-02-12 NOTE — H&P (Addendum)
History and Physical    [name] [name] SEG:[id number] DOB: [birth year] DOA: 02/12/2022  PCP: Ginger Organ., MD (Confirm with patient/family/NH records and if not entered, this has to be entered at [facility] point of entry) Patient coming from: Home  I have personally briefly reviewed patient's old medical records in [facility]  Chief Complaint: Cough, SOB  HPI: [name] [name] is a 88 y.o. female with medical history significant of HTN, chronic HFpEF, mild intermittent asthma, OSA on CPAP HS, CKD stage IV, IDDM, DVT on Eliquis, Alzheimer's dementia, brought in by family for evaluation of worsening cough shortness of breath.  Symptoms started about 7 days ago, after Thanksgiving family gathered together.  Last Monday, several family members started to have cough sore throat and phlegm member tested positive for COVID.  Same day on Monday, patient started to have cough, initially was dry cough then turned productive with gray-yellowish sputum.  Friday, patient called her primary, who started patient on Augmentin and prednisone 20 mg daily, and yesterday, patient spiked a low-grade fever 99.0.  Continue to have productive cough, and furthermore, patient developed severe exertional dyspnea, this morning, even minimum activity triggered significant shortness of breath.  Denies any chest pain, no wheezing.  Denies any urinary symptoms no diarrhea.  Patient has at baseline HFpEF and CKD stage IV on Lasix 40 mg other day chronically, 2 weeks ago, patient was evaluated and was considered to be " on the dry side" and her Lasix regimen was changed from 40 mg every other day to 20 mg daily.  At baseline, patient has Alzheimer's dementia with sundowning, and she lives with her daughter.  Also reported the patient has had UTIs in the past and patient's urine smelled strong over the weekend.  But patient denied any urinary symptoms.  ED Course: Afebrile, blood pressure significant elevated with labs BP>  200, chest x-ray showed cardiomegaly and mild pulmonary congestion.  1 dose of IV Lasix and 1 dose of IV hydralazine given in the ED.  Creatinine 2.1, compared to baseline 1.4-1.91-year ago. COVID-19 and flu negative.    Review of Systems: As per HPI otherwise 14 point review of systems negative.    Past Medical History:  Diagnosis Date   Allergic rhinitis, cause unspecified    Asthma    Esophageal reflux    Sleep apnea    on C-pap- Dr young follows   Syncope 2019   Normal LVF, negative monitor   Type II or unspecified type diabetes mellitus without mention of complication, not stated as uncontrolled    Unspecified essential hypertension    mild LVH, normal LVF, grade 2 DD by echo 2019    Past Surgical History:  Procedure Laterality Date   APPENDECTOMY     ESOPHAGOGASTRODUODENOSCOPY (EGD) WITH PROPOFOL N/A 01/29/2021   Procedure: ESOPHAGOGASTRODUODENOSCOPY (EGD) WITH PROPOFOL;  Surgeon: Ronnette Juniper, MD;  Location: [facility];  Service: Gastroenterology;  Laterality: N/A;   FIXATION KYPHOPLASTY LUMBAR SPINE     FOREIGN BODY REMOVAL  01/29/2021   Procedure: FOREIGN BODY REMOVAL;  Surgeon: Ronnette Juniper, MD;  Location: [facility];  Service: Gastroenterology;;   HIATAL HERNIA REPAIR     INTRAMEDULLARY (IM) NAIL INTERTROCHANTERIC Left 01/21/2021   Procedure: INTRAMEDULLARY (IM) NAIL INTERTROCHANTRIC;  Surgeon: Leandrew Koyanagi, MD;  Location: [facility];  Service: Orthopedics;  Laterality: Left;   LUMBAR SPINE SURGERY     TUBAL LIGATION     VAGINAL HYSTERECTOMY       reports that she  has never smoked. She has never used smokeless tobacco. She reports that she does not drink alcohol and does not use drugs.  Allergies  Allergen Reactions   Aspirin Nausea And Vomiting   Codeine Nausea And Vomiting   Ibuprofen Nausea And Vomiting   Meperidine Hcl    Tape     ADHESIVE TAPE CAUSES BLISTERS   Augmentin [Amoxicillin-Pot Clavulanate]     GI upset    Family History  Problem Relation  Age of Onset   Lung cancer Brother    Diabetes Mother      Prior to Admission medications   Medication Sig Start Date End Date Taking? Authorizing Provider  albuterol (PROAIR HFA) 108 (90 Base) MCG/ACT inhaler 2 puffs every 4-6 hours as directed- rescue 02/17/21   Medina-Vargas, Monina C, NP  cefadroxil (DURICEF) 500 MG capsule Take 1 capsule (500 mg total) by mouth 2 (two) times daily. 02/20/21   Kathie Dike, MD  ELIQUIS 2.5 MG TABS tablet Take 1 tablet (2.5 mg total) by mouth 2 (two) times daily. 02/17/21   Medina-Vargas, Monina C, NP  furosemide (LASIX) 20 MG tablet Take 1 tablet (20 mg total) by mouth daily. as needed for edema 02/17/21   Medina-Vargas, Monina C, NP  hydrALAZINE (APRESOLINE) 25 MG tablet Take 1 tablet (25 mg total) by mouth 3 (three) times daily. as needed for SBP greater thn or equal to 140 02/17/21   Medina-Vargas, Monina C, NP  Insulin Glargine (BASAGLAR KWIKPEN) 100 UNIT/ML Inject 10 Units into the skin daily. Give 10 units sub q at bedtime for DM Patient taking differently: Inject 10 Units into the skin at bedtime. 02/17/21   Medina-Vargas, Monina C, NP  loratadine (CLARITIN) 10 MG tablet Take 10 mg by mouth daily as needed for allergies.    [provider]  metoprolol succinate (TOPROL-XL) 25 MG 24 hr tablet Take 1 tablet (25 mg total) by mouth in the morning and at bedtime. 02/17/21   Medina-Vargas, Monina C, NP  mirtazapine (REMERON) 7.5 MG tablet Take 1 tablet (7.5 mg total) by mouth at bedtime. 02/17/21   Medina-Vargas, Monina C, NP  Nutritional Supplements (NUTRITIONAL SUPPLEMENT PO) Take by mouth daily. Magic Cup.    [provider]  polyethylene glycol (MIRALAX / GLYCOLAX) 17 g packet Take 17 g by mouth daily as needed for mild constipation. 01/25/21   Domenic Polite, MD  PROTONIX 40 MG tablet Take 1 tablet (40 mg total) by mouth 2 (two) times daily before a meal for 28 days, THEN 1 tablet (40 mg total) 2 (two) times daily for 28 days. Patient  taking differently: Take 1 tablet (40 mg total) by mouth 2 (two) times daily before meals for GERD 02/17/21 04/14/21  Medina-Vargas, Monina C, NP  senna-docusate (SENOKOT-S) 8.6-50 MG tablet Take 1 tablet by mouth 2 (two) times daily. 01/25/21   Domenic Polite, MD  simvastatin (ZOCOR) 40 MG tablet Take 1 tablet (40 mg total) by mouth every evening. 02/17/21   Medina-VargasSenaida Lange, NP    Physical Exam: Vitals:   02/12/22 1056 02/12/22 1100 02/12/22 1130 02/12/22 1230  BP: (!) 219/72 (!) 217/79 (!) 215/67 (!) 205/57  Pulse:  (!) 58 60 (!) 57  Resp:  18 18 (!) 29  Temp:      TempSrc:      SpO2:  95% 96% 93%  Weight:      Height:        Constitutional: NAD, calm, comfortable Vitals:   02/12/22 1056 02/12/22 1100  02/12/22 1130 02/12/22 1230  BP: (!) 219/72 (!) 217/79 (!) 215/67 (!) 205/57  Pulse:  (!) 58 60 (!) 57  Resp:  18 18 (!) 29  Temp:      TempSrc:      SpO2:  95% 96% 93%  Weight:      Height:       Eyes: PERRL, lids and conjunctivae normal ENMT: Mucous membranes are moist. Posterior pharynx clear of any exudate or lesions.Normal dentition.  Neck: normal, supple, no masses, no thyromegaly Respiratory: clear to auscultation bilaterally, no wheezing, fine crackles to bilateral mid field with increasing breathing effort no accessory muscle use.  Cardiovascular: Regular rate and rhythm, no murmurs / rubs / gallops. No extremity edema. 2+ pedal pulses. No carotid bruits.  Abdomen: no tenderness, no masses palpated. No hepatosplenomegaly. Bowel sounds positive.  Musculoskeletal: no clubbing / cyanosis. No joint deformity upper and lower extremities. Good ROM, no contractures. Normal muscle tone.  Skin: no rashes, lesions, ulcers. No induration Neurologic: CN 2-12 grossly intact. Sensation intact, DTR normal. Strength 5/5 in all 4.  Psychiatric: Demented    Labs on Admission: I have personally reviewed following labs and imaging studies  CBC: Recent Labs  Lab 02/12/22 1115   WBC 3.6*  NEUTROABS 2.3  HGB 10.2*  HCT 32.3*  MCV 97.6  PLT 798*   Basic Metabolic Panel: Recent Labs  Lab 02/12/22 1115  NA 138  K 4.2  CL 114*  CO2 19*  GLUCOSE 174*  BUN 28*  CREATININE 2.13*  CALCIUM 9.8   GFR: Estimated Creatinine Clearance: 16.5 mL/min (A) (by C-G formula based on SCr of 2.13 mg/dL (H)). Liver Function Tests: No results for input(s): "AST", "ALT", "ALKPHOS", "BILITOT", "PROT", "ALBUMIN" in the last 168 hours. No results for input(s): "LIPASE", "AMYLASE" in the last 168 hours. No results for input(s): "AMMONIA" in the last 168 hours. Coagulation Profile: No results for input(s): "INR", "PROTIME" in the last 168 hours. Cardiac Enzymes: No results for input(s): "CKTOTAL", "CKMB", "CKMBINDEX", "TROPONINI" in the last 168 hours. BNP (last 3 results) No results for input(s): "PROBNP" in the last 8760 hours. HbA1C: No results for input(s): "HGBA1C" in the last 72 hours. CBG: No results for input(s): "GLUCAP" in the last 168 hours. Lipid Profile: No results for input(s): "CHOL", "HDL", "LDLCALC", "TRIG", "CHOLHDL", "LDLDIRECT" in the last 72 hours. Thyroid Function Tests: No results for input(s): "TSH", "T4TOTAL", "FREET4", "T3FREE", "THYROIDAB" in the last 72 hours. Anemia Panel: No results for input(s): "VITAMINB12", "FOLATE", "FERRITIN", "TIBC", "IRON", "RETICCTPCT" in the last 72 hours. Urine analysis:    Component Value Date/Time   COLORURINE AMBER (A) 02/18/2021 2005   APPEARANCEUR HAZY (A) 02/18/2021 2005   LABSPEC 1.017 02/18/2021 2005   PHURINE 5.0 02/18/2021 2005   GLUCOSEU NEGATIVE 02/18/2021 2005   HGBUR NEGATIVE 02/18/2021 2005   BILIRUBINUR NEGATIVE 02/18/2021 2005   KETONESUR 5 (A) 02/18/2021 2005   PROTEINUR 30 (A) 02/18/2021 2005   UROBILINOGEN 1.0 07/25/2009 1045   NITRITE POSITIVE (A) 02/18/2021 2005   LEUKOCYTESUR TRACE (A) 02/18/2021 2005    Radiological Exams on Admission: DG Chest Portable 1 View  Result Date:  02/12/2022 CLINICAL DATA:  Cough, shortness of breath EXAM: PORTABLE CHEST 1 VIEW COMPARISON:  02/18/2021 FINDINGS: Transverse diameter of heart is increased. There are no signs of pulmonary edema or focal pulmonary consolidation. There is no pleural effusion or pneumothorax. Surgical clips are noted at the gastroesophageal junction and epigastrium. IMPRESSION: Cardiomegaly. There are no signs of pulmonary edema  or focal pulmonary consolidation. Electronically Signed   By: Elmer Picker M.D.   On: 02/12/2022 11:25    EKG: Independently reviewed.  Sinus, chronic RBBB, no acute ST changes.  Assessment/Plan Principal Problem:   CHF (congestive heart failure) (HCC) Active Problems:   Asthma, mild persistent   CKD (chronic kidney disease) stage 4, GFR 15-29 ml/min (HCC)   Acute on chronic diastolic CHF (congestive heart failure) ([facility])  (please populate well all problems here in Problem List. (For example, if patient is on BP meds at home and you resume or decide to hold them, it is a problem that needs to be her. Same for CAD, COPD, HLD and so on)  Acute on chronic HFpEF decompensation -Likely secondary to HTN emergency -1 dose of IV Lasix given in the ED, will resume patient's home p.o. Lasix tomorrow with 40 mg daily.  Monitor kidney function -Echocardiogram -Daily weight and I&O's  Probably concurrent acute bronchitis/asthma exacerbation -Has been on albuterol and prednisone and Augmentin for the last 2 days, clinically no wheezing. But given her spiking fever yesterday as well as productive cough with yellowish sputum, still suspect concurrent bronchitis versus pneumonia, will check sputum culture, switch Augmentin to doxycycline to cover atypical infection as there is no significant acute infiltrates on x-ray.  Check atypical pneumonia study and send trending procalcitonin -Spirometry and flutter valve -Continue current breathing treatment regimen DuoNebs and p.o. steroid, will add  Pulmicort  HTN emergency -Change metoprolol to Coreg -Will D/C clonidine given her CHF Hx -Hold off ARB, for there renal function surge -She is only on as needed hydralazine, will change as needed hydralazine to standing dose of hydralazine 25 mg 3 times daily and adjust according to response. Also add Imdur 30 mg.  CKD stage IV -With worsening of creatinine level, check renal ultrasound -Clinically appears to be fluid overloaded from CHF decompensation, 1 dose of IV Lasix and switch to p.o. Lasix tomorrow.  Fever -Might be PNA, but will check UA -On doxycycline  Chronic normocytic anemia -Check iron study, and reticulocyte count.  H&H stable.  Alzheimer's dementia with sundowning -EKG showed no QTc prolongation, start low-dose of Seroquel, and 1 dose of IM Zyprexa given in the ED. -Consult case management for possible home care  Hx of DVT -On renal dosed Eliquis  IDDM -Continue Lantus -Add sliding scale  OSA -CPAP HS  DNR -Patient was made DNR by primary in May 2023.  Had palliative care home visit x 2 then stopped following up.  DVT prophylaxis: Eliquis Code Status: DNR Family Communication: Daughter at bedside Disposition Plan: Expect less than 2 midnight hospital stay. Consults called: [facility]One Admission status: Tele obs   Lequita Halt MD [facility] Pager [phone]  02/12/2022, 1:43 PM

## 2022-02-12 NOTE — ED Notes (Signed)
Pt's daughter notified this RN that pt is starting to sundown early and get agitated. No prn noted, Marko Stai MD to see if we can get some prns for pt.

## 2022-02-12 NOTE — ED Provider Notes (Signed)
[facility] EMERGENCY DEPARTMENT Provider Note   CSN: [id number] Arrival date & time: 02/12/22  1751     History  Chief Complaint  Patient presents with   Cough    [name] [name] is a 88 y.o. female.  Patient is here with cough, shortness of breath.  History of asthma, diabetes, fluid issues in the past.  Possible exposure to COVID last week.  She has been on antibiotic and steroids for the last 2 days for presumed infection/asthma exacerbation.  Coughing was worse this morning.  Has noticed some increase swelling of the legs here recently.  Nothing makes it worse or better.  Denies any chest pain.  Denies any fevers or chills.  Did not take any of her medications this morning except for antibiotics.  The history is provided by the patient.       Home Medications Prior to Admission medications   Medication Sig Start Date End Date Taking? Authorizing Provider  albuterol (PROAIR HFA) 108 (90 Base) MCG/ACT inhaler 2 puffs every 4-6 hours as directed- rescue 02/17/21   Medina-Vargas, Monina C, NP  cefadroxil (DURICEF) 500 MG capsule Take 1 capsule (500 mg total) by mouth 2 (two) times daily. 02/20/21   Kathie Dike, MD  ELIQUIS 2.5 MG TABS tablet Take 1 tablet (2.5 mg total) by mouth 2 (two) times daily. 02/17/21   Medina-Vargas, Monina C, NP  furosemide (LASIX) 20 MG tablet Take 1 tablet (20 mg total) by mouth daily. as needed for edema 02/17/21   Medina-Vargas, Monina C, NP  hydrALAZINE (APRESOLINE) 25 MG tablet Take 1 tablet (25 mg total) by mouth 3 (three) times daily. as needed for SBP greater thn or equal to 140 02/17/21   Medina-Vargas, Monina C, NP  Insulin Glargine (BASAGLAR KWIKPEN) 100 UNIT/ML Inject 10 Units into the skin daily. Give 10 units sub q at bedtime for DM Patient taking differently: Inject 10 Units into the skin at bedtime. 02/17/21   Medina-Vargas, Monina C, NP  loratadine (CLARITIN) 10 MG tablet Take 10 mg by mouth daily as needed for  allergies.    [provider]  metoprolol succinate (TOPROL-XL) 25 MG 24 hr tablet Take 1 tablet (25 mg total) by mouth in the morning and at bedtime. 02/17/21   Medina-Vargas, Monina C, NP  mirtazapine (REMERON) 7.5 MG tablet Take 1 tablet (7.5 mg total) by mouth at bedtime. 02/17/21   Medina-Vargas, Monina C, NP  Nutritional Supplements (NUTRITIONAL SUPPLEMENT PO) Take by mouth daily. Magic Cup.    [provider]  polyethylene glycol (MIRALAX / GLYCOLAX) 17 g packet Take 17 g by mouth daily as needed for mild constipation. 01/25/21   Domenic Polite, MD  PROTONIX 40 MG tablet Take 1 tablet (40 mg total) by mouth 2 (two) times daily before a meal for 28 days, THEN 1 tablet (40 mg total) 2 (two) times daily for 28 days. Patient taking differently: Take 1 tablet (40 mg total) by mouth 2 (two) times daily before meals for GERD 02/17/21 04/14/21  Medina-Vargas, Monina C, NP  senna-docusate (SENOKOT-S) 8.6-50 MG tablet Take 1 tablet by mouth 2 (two) times daily. 01/25/21   Domenic Polite, MD  simvastatin (ZOCOR) 40 MG tablet Take 1 tablet (40 mg total) by mouth every evening. 02/17/21   Medina-Vargas, Monina C, NP      Allergies    Aspirin, Codeine, Ibuprofen, Meperidine hcl, Tape, and Augmentin [amoxicillin-pot clavulanate]    Review of Systems   Review of Systems  Physical  Exam Updated Vital Signs  ED Triage Vitals  Enc Vitals Group     BP 02/12/22 1009 (!) 218/79     Pulse Rate 02/12/22 1009 62     Resp 02/12/22 1009 18     Temp 02/12/22 1011 98.7 F (37.1 C)     Temp Source 02/12/22 1011 Oral     SpO2 02/12/22 1009 98 %     Weight 02/12/22 1004 154 lb 1.6 oz (69.9 kg)     Height 02/12/22 1004 5\' 3"  (1.6 m)     Head Circumference --      Peak Flow --      Pain Score 02/12/22 1004 0     Pain Loc --      Pain Edu? --      Excl. in [place]? --     Physical Exam Vitals and nursing note reviewed.  Constitutional:      General: She is not in acute distress.    Appearance:  She is well-developed. She is not ill-appearing.  HENT:     Head: Normocephalic and atraumatic.     Right Ear: Tympanic membrane normal.     Left Ear: Tympanic membrane normal.     Nose: Nose normal.     Mouth/Throat:     Mouth: Mucous membranes are moist.  Eyes:     Extraocular Movements: Extraocular movements intact.     Conjunctiva/sclera: Conjunctivae normal.     Pupils: Pupils are equal, round, and reactive to light.  Cardiovascular:     Rate and Rhythm: Normal rate and regular rhythm.     Pulses: Normal pulses.     Heart sounds: Normal heart sounds. No murmur heard. Pulmonary:     Effort: No respiratory distress.     Comments: Diminished breath sounds Abdominal:     General: Abdomen is flat.     Palpations: Abdomen is soft.     Tenderness: There is no abdominal tenderness.  Musculoskeletal:        General: No swelling.     Cervical back: Normal range of motion and neck supple.     Right lower leg: Edema present.     Left lower leg: Edema present.  Skin:    General: Skin is warm and dry.     Capillary Refill: Capillary refill takes less than 2 seconds.  Neurological:     General: No focal deficit present.     Mental Status: She is alert.  Psychiatric:        Mood and Affect: Mood normal.     ED Results / Procedures / Treatments   Labs (all labs ordered are listed, but only abnormal results are displayed) Labs Reviewed  CBC WITH DIFFERENTIAL/PLATELET - Abnormal; Notable for the following components:      Result Value   WBC 3.6 (*)    RBC 3.31 (*)    Hemoglobin 10.2 (*)    HCT 32.3 (*)    Platelets 128 (*)    Abs Immature Granulocytes 0.35 (*)    All other components within normal limits  BASIC METABOLIC PANEL - Abnormal; Notable for the following components:   Chloride 114 (*)    CO2 19 (*)    Glucose, Bld 174 (*)    BUN 28 (*)    Creatinine, Ser 2.13 (*)    GFR, Estimated 22 (*)    All other components within normal limits  BRAIN NATRIURETIC PEPTIDE -  Abnormal; Notable for the following components:   B Natriuretic Peptide  1,045.2 (*)    All other components within normal limits  TROPONIN I (HIGH SENSITIVITY) - Abnormal; Notable for the following components:   Troponin I (High Sensitivity) 20 (*)    All other components within normal limits  RESP PANEL BY RT-PCR (FLU A&B, COVID) ARPGX2  MYCOPLASMA PNEUMONIAE ANTIBODY, IGM  LEGIONELLA PNEUMOPHILA SEROGP 1 UR AG  PROCALCITONIN  BLOOD GAS, VENOUS  TROPONIN I (HIGH SENSITIVITY)    EKG EKG Interpretation  Date/Time:  Monday February 12 2022 10:15:01 EST Ventricular Rate:  63 PR Interval:  229 QRS Duration: 120 QT Interval:  439 QTC Calculation: 450 R Axis:   6 Text Interpretation: Sinus rhythm Prolonged PR interval IVCD, consider atypical RBBB Confirmed by Lennice Sites ([id number]) on 02/12/2022 10:22:57 AM  Radiology DG Chest Portable 1 View  Result Date: 02/12/2022 CLINICAL DATA:  Cough, shortness of breath EXAM: PORTABLE CHEST 1 VIEW COMPARISON:  02/18/2021 FINDINGS: Transverse diameter of heart is increased. There are no signs of pulmonary edema or focal pulmonary consolidation. There is no pleural effusion or pneumothorax. Surgical clips are noted at the gastroesophageal junction and epigastrium. IMPRESSION: Cardiomegaly. There are no signs of pulmonary edema or focal pulmonary consolidation. Electronically Signed   By: Elmer Picker M.D.   On: 02/12/2022 11:25    Procedures Procedures    Medications Ordered in ED Medications  doxycycline (VIBRA-TABS) tablet 100 mg (has no administration in time range)  Basaglar KwikPen KwikPen 10 Units (has no administration in time range)  mirtazapine (REMERON) tablet 7.5 mg (has no administration in time range)  polyethylene glycol (MIRALAX / GLYCOLAX) packet 17 g (has no administration in time range)  pantoprazole (PROTONIX) EC tablet 40 mg (has no administration in time range)  senna-docusate (Senokot-S) tablet 1 tablet (has no  administration in time range)  hydrALAZINE (APRESOLINE) tablet 25 mg (has no administration in time range)  carvedilol (COREG) tablet 3.125 mg (has no administration in time range)  simvastatin (ZOCOR) tablet 40 mg (has no administration in time range)  apixaban (ELIQUIS) tablet 2.5 mg (has no administration in time range)  albuterol (VENTOLIN HFA) 108 (90 Base) MCG/ACT inhaler 2 puff (has no administration in time range)  predniSONE (DELTASONE) tablet 20 mg (has no administration in time range)  guaiFENesin (MUCINEX) 12 hr tablet 1,200 mg (has no administration in time range)  benzonatate (TESSALON) capsule 100 mg (has no administration in time range)  sodium chloride flush (NS) 0.9 % injection 3 mL (has no administration in time range)  sodium chloride flush (NS) 0.9 % injection 3 mL (has no administration in time range)  0.9 %  sodium chloride infusion (has no administration in time range)  acetaminophen (TYLENOL) tablet 650 mg (has no administration in time range)  ondansetron (ZOFRAN) injection 4 mg (has no administration in time range)  hydrALAZINE (APRESOLINE) tablet 25 mg (25 mg Oral Given 02/12/22 1056)  ipratropium-albuterol (DUONEB) 0.5-2.5 (3) MG/3ML nebulizer solution 3 mL (3 mLs Nebulization Given 02/12/22 1305)  furosemide (LASIX) injection 40 mg (40 mg Intravenous Given 02/12/22 1256)  hydrALAZINE (APRESOLINE) injection 10 mg (10 mg Intravenous Given 02/12/22 1257)    ED Course/ Medical Decision Making/ A&P                           Medical Decision Making Amount and/or Complexity of Data Reviewed Labs: ordered. Radiology: ordered.  Risk Prescription drug management. Decision regarding hospitalization.   DYSTANY DUFFY is a 43-year-old female here with shortness  of breath and cough.  Patient hypertensive in the 200s upon arrival.  No chest pain.  She is been on steroids and antibiotics the last couple days for presumed pneumonia/asthma exacerbation.  Comorbidities  include asthma, hypertension.  Differential diagnosis includes heart failure, volume overload, less likely ACS, pneumonia, viral process.  Will get CBC, BMP, troponin, BNP, chest x-ray.  Per my review and interpretation of EKG, normal sinus rhythm.  No ischemic changes.  Per my review and interpretation labs patient has no significant anemia or electrolyte abnormality or kidney injury.  Troponin is 20, BNP is 1000.  COVID test negative, flu test negative.  Chest x-ray consistent with volume overload.  Overall patient clinically appears to be volume overloaded and suspect that her symptoms are secondary to heart failure exacerbation.  Will admit her to medicine for further care.  This chart was dictated using voice recognition software.  Despite best efforts to proofread,  errors can occur which can change the documentation meaning.         Final Clinical Impression(s) / ED Diagnoses Final diagnoses:  Acute respiratory failure with hypoxia (HCC)  Hypervolemia, unspecified hypervolemia type    Rx / DC Orders ED Discharge Orders     None         Lennice Sites, DO 02/12/22 1331

## 2022-02-12 NOTE — ED Notes (Signed)
Called lab to make sure covid swab sent will be ran under new covid/flu order.

## 2022-02-12 NOTE — ED Triage Notes (Addendum)
Pt arrived to ED via [facility] from home w/ initial c/o non-productive cough x 1 wk and has been seen by her PCP. Cough got worse this morning and took her breath away. Pt took home nebs. Pt was found to be hypertensive 260/90 w/ EMS. Family reports hallucinations intermittent x 1 wk and PCP gave pt prednisone. CXR was not performed. 20g L hand. 98% RA, HR 80. Gave pt a mask to wear d/t cough.

## 2022-02-12 NOTE — ED Notes (Signed)
Pt set up to eat and cut chicken, repositioned in bed.

## 2022-02-13 ENCOUNTER — Observation Stay (HOSPITAL_COMMUNITY): Payer: Medicare Other

## 2022-02-13 DIAGNOSIS — I130 Hypertensive heart and chronic kidney disease with heart failure and stage 1 through stage 4 chronic kidney disease, or unspecified chronic kidney disease: Secondary | ICD-10-CM | POA: Diagnosis present

## 2022-02-13 DIAGNOSIS — Z66 Do not resuscitate: Secondary | ICD-10-CM | POA: Diagnosis present

## 2022-02-13 DIAGNOSIS — I161 Hypertensive emergency: Secondary | ICD-10-CM | POA: Diagnosis present

## 2022-02-13 DIAGNOSIS — Z7189 Other specified counseling: Secondary | ICD-10-CM | POA: Diagnosis not present

## 2022-02-13 DIAGNOSIS — J441 Chronic obstructive pulmonary disease with (acute) exacerbation: Secondary | ICD-10-CM | POA: Diagnosis present

## 2022-02-13 DIAGNOSIS — F32A Depression, unspecified: Secondary | ICD-10-CM | POA: Diagnosis present

## 2022-02-13 DIAGNOSIS — N179 Acute kidney failure, unspecified: Secondary | ICD-10-CM | POA: Diagnosis present

## 2022-02-13 DIAGNOSIS — E1169 Type 2 diabetes mellitus with other specified complication: Secondary | ICD-10-CM | POA: Diagnosis present

## 2022-02-13 DIAGNOSIS — F0390 Unspecified dementia without behavioral disturbance: Secondary | ICD-10-CM

## 2022-02-13 DIAGNOSIS — Z6831 Body mass index (BMI) 31.0-31.9, adult: Secondary | ICD-10-CM

## 2022-02-13 DIAGNOSIS — E669 Obesity, unspecified: Secondary | ICD-10-CM | POA: Diagnosis present

## 2022-02-13 DIAGNOSIS — E1122 Type 2 diabetes mellitus with diabetic chronic kidney disease: Secondary | ICD-10-CM | POA: Diagnosis present

## 2022-02-13 DIAGNOSIS — I5031 Acute diastolic (congestive) heart failure: Secondary | ICD-10-CM

## 2022-02-13 DIAGNOSIS — G309 Alzheimer's disease, unspecified: Secondary | ICD-10-CM | POA: Diagnosis present

## 2022-02-13 DIAGNOSIS — N184 Chronic kidney disease, stage 4 (severe): Secondary | ICD-10-CM | POA: Diagnosis present

## 2022-02-13 DIAGNOSIS — E785 Hyperlipidemia, unspecified: Secondary | ICD-10-CM

## 2022-02-13 DIAGNOSIS — J4531 Mild persistent asthma with (acute) exacerbation: Secondary | ICD-10-CM | POA: Diagnosis present

## 2022-02-13 DIAGNOSIS — J9601 Acute respiratory failure with hypoxia: Secondary | ICD-10-CM | POA: Diagnosis present

## 2022-02-13 DIAGNOSIS — I509 Heart failure, unspecified: Secondary | ICD-10-CM | POA: Diagnosis present

## 2022-02-13 DIAGNOSIS — I10 Essential (primary) hypertension: Secondary | ICD-10-CM

## 2022-02-13 DIAGNOSIS — F0283 Dementia in other diseases classified elsewhere, unspecified severity, with mood disturbance: Secondary | ICD-10-CM | POA: Diagnosis present

## 2022-02-13 DIAGNOSIS — Z1152 Encounter for screening for COVID-19: Secondary | ICD-10-CM | POA: Diagnosis not present

## 2022-02-13 DIAGNOSIS — E6609 Other obesity due to excess calories: Secondary | ICD-10-CM

## 2022-02-13 DIAGNOSIS — I480 Paroxysmal atrial fibrillation: Secondary | ICD-10-CM | POA: Diagnosis present

## 2022-02-13 DIAGNOSIS — D631 Anemia in chronic kidney disease: Secondary | ICD-10-CM | POA: Diagnosis present

## 2022-02-13 DIAGNOSIS — J440 Chronic obstructive pulmonary disease with acute lower respiratory infection: Secondary | ICD-10-CM | POA: Diagnosis present

## 2022-02-13 DIAGNOSIS — E1165 Type 2 diabetes mellitus with hyperglycemia: Secondary | ICD-10-CM | POA: Diagnosis present

## 2022-02-13 DIAGNOSIS — Z794 Long term (current) use of insulin: Secondary | ICD-10-CM | POA: Diagnosis not present

## 2022-02-13 DIAGNOSIS — I5033 Acute on chronic diastolic (congestive) heart failure: Secondary | ICD-10-CM | POA: Diagnosis present

## 2022-02-13 DIAGNOSIS — J449 Chronic obstructive pulmonary disease, unspecified: Secondary | ICD-10-CM | POA: Diagnosis present

## 2022-02-13 DIAGNOSIS — Z515 Encounter for palliative care: Secondary | ICD-10-CM | POA: Diagnosis not present

## 2022-02-13 DIAGNOSIS — Z86718 Personal history of other venous thrombosis and embolism: Secondary | ICD-10-CM

## 2022-02-13 DIAGNOSIS — F05 Delirium due to known physiological condition: Secondary | ICD-10-CM | POA: Diagnosis present

## 2022-02-13 LAB — ECHOCARDIOGRAM COMPLETE
Area-P 1/2: 3.03 cm2
Height: 64 in
S' Lateral: 3.1 cm
Weight: 3086.44 oz

## 2022-02-13 LAB — PROCALCITONIN: Procalcitonin: <0.1 ng/mL

## 2022-02-13 LAB — MYCOPLASMA PNEUMONIAE ANTIBODY, IGM: Mycoplasma pneumo IgM: <770 U/mL (ref 0–769)

## 2022-02-13 LAB — BASIC METABOLIC PANEL
Anion gap: 9 (ref 5–15)
BUN: 35 mg/dL — ABNORMAL HIGH (ref 8–23)
CO2: 22 mmol/L (ref 22–32)
Calcium: 10.4 mg/dL — ABNORMAL HIGH (ref 8.9–10.3)
Chloride: 110 mmol/L (ref 98–111)
Creatinine, Ser: 2.36 mg/dL — ABNORMAL HIGH (ref 0.44–1.00)
GFR, Estimated: 19 mL/min — ABNORMAL LOW (ref >60)
Glucose, Bld: 228 mg/dL — ABNORMAL HIGH (ref 70–99)
Potassium: 4.2 mmol/L (ref 3.5–5.1)
Sodium: 141 mmol/L (ref 135–145)

## 2022-02-13 LAB — BLOOD GAS, VENOUS
Acid-base deficit: 1.3 mmol/L (ref 0.0–2.0)
Bicarbonate: 22.7 mmol/L (ref 20.0–28.0)
O2 Saturation: 90.7 %
Patient temperature: 37
pCO2, Ven: 35 mmHg — ABNORMAL LOW (ref 44–60)
pH, Ven: 7.42 (ref 7.25–7.43)
pO2, Ven: 66 mmHg — ABNORMAL HIGH (ref 32–45)

## 2022-02-13 LAB — HEMOGLOBIN A1C
Hgb A1c MFr Bld: 7.4 % — ABNORMAL HIGH (ref 4.8–5.6)
Mean Plasma Glucose: 166 mg/dL

## 2022-02-13 LAB — GLUCOSE, CAPILLARY
Glucose-Capillary: 169 mg/dL — ABNORMAL HIGH (ref 70–99)
Glucose-Capillary: 147 mg/dL — ABNORMAL HIGH (ref 70–99)
Glucose-Capillary: 249 mg/dL — ABNORMAL HIGH (ref 70–99)
Glucose-Capillary: 309 mg/dL — ABNORMAL HIGH (ref 70–99)

## 2022-02-13 MED ORDER — PANTOPRAZOLE SODIUM 40 MG PO TBEC
40.0000 mg | DELAYED_RELEASE_TABLET | Freq: Every day | ORAL | Status: DC
  Administered 2022-02-13 – 2022-02-19 (×7): 40 mg via ORAL
  Filled 2022-02-13 (×7): qty 1

## 2022-02-13 MED ORDER — AZITHROMYCIN 250 MG PO TABS
500.0000 mg | ORAL_TABLET | Freq: Every day | ORAL | Status: AC
  Administered 2022-02-13 – 2022-02-17 (×5): 500 mg via ORAL
  Filled 2022-02-13 (×5): qty 2

## 2022-02-13 MED ORDER — GUAIFENESIN-DM 100-10 MG/5ML PO SYRP
5.0000 mL | ORAL_SOLUTION | ORAL | Status: DC | PRN
  Administered 2022-02-13 – 2022-02-19 (×6): 5 mL via ORAL
  Filled 2022-02-13 (×6): qty 5

## 2022-02-13 MED ORDER — METHYLPREDNISOLONE SODIUM SUCC 125 MG IJ SOLR
80.0000 mg | Freq: Every day | INTRAMUSCULAR | Status: DC
  Administered 2022-02-13: 80 mg via INTRAVENOUS
  Filled 2022-02-13: qty 2

## 2022-02-13 MED ORDER — IPRATROPIUM-ALBUTEROL 0.5-2.5 (3) MG/3ML IN SOLN
3.0000 mL | Freq: Three times a day (TID) | RESPIRATORY_TRACT | Status: DC
  Administered 2022-02-14 – 2022-02-15 (×3): 3 mL via RESPIRATORY_TRACT
  Filled 2022-02-13 (×4): qty 3

## 2022-02-13 MED ORDER — IPRATROPIUM-ALBUTEROL 0.5-2.5 (3) MG/3ML IN SOLN: 3.0000 mL | RESPIRATORY_TRACT | Status: DC | PRN

## 2022-02-13 MED ORDER — INSULIN ASPART 100 UNIT/ML IJ SOLN
0.0000 [IU] | Freq: Every day | INTRAMUSCULAR | Status: DC
  Administered 2022-02-13: 3 [IU] via SUBCUTANEOUS
  Administered 2022-02-15: 4 [IU] via SUBCUTANEOUS
  Administered 2022-02-15: 3 [IU] via SUBCUTANEOUS
  Administered 2022-02-16: 2 [IU] via SUBCUTANEOUS
  Administered 2022-02-18: 4 [IU] via SUBCUTANEOUS

## 2022-02-13 MED ORDER — IPRATROPIUM-ALBUTEROL 0.5-2.5 (3) MG/3ML IN SOLN
3.0000 mL | Freq: Four times a day (QID) | RESPIRATORY_TRACT | Status: DC
  Administered 2022-02-13 (×2): 3 mL via RESPIRATORY_TRACT
  Filled 2022-02-13 (×2): qty 3

## 2022-02-13 MED ORDER — MOMETASONE FURO-FORMOTEROL FUM 100-5 MCG/ACT IN AERO
2.0000 | INHALATION_SPRAY | Freq: Two times a day (BID) | RESPIRATORY_TRACT | Status: DC
  Administered 2022-02-13 – 2022-02-19 (×11): 2 via RESPIRATORY_TRACT
  Filled 2022-02-13: qty 8.8

## 2022-02-13 NOTE — Progress Notes (Signed)
  Echocardiogram 2D Echocardiogram has been performed.  [name] [name] 02/13/2022, 10:08 AM

## 2022-02-13 NOTE — TOC Initial Note (Signed)
Transition of Care [facility]) - Initial/Assessment Note    Patient Details  Name: [name] [name] MRN: [id number] Date of Birth: 1929/05/08  [facility] CM/SW Contact:    Ninfa Meeker, RN Phone Number: 02/13/2022, 5:14 PM  Clinical Narrative:      Patient from home with family caring for her, admitted with CHF, has Alzheimers dementia. CM spoke with patient's daughter Katha Hamming [phone], discussed need for Home Health, discussed [place], she asked that referral be called to [place]. CM called [facility] Liaison with referral. Patient has all necessary DME at home: hospital bed, lift chair, shower chair, RW. Family provides care for patient.   Expected Discharge Plan: [place] Barriers to Discharge: Continued Medical Work up   Patient Goals and CMS Choice     Choice offered to / list presented to : Adult Children  Expected Discharge Plan and Services Expected Discharge Plan: [place]   Discharge Planning Services: CM Consult Post Acute Care Choice: Home Health Living arrangements for the past 2 months: Single Family Home                 DME Arranged:  (has hopsital bed, lift chair, shower chair commode walker) DME Agency: NA       HH Arranged: PT, OT   Date HH Agency Contacted: 02/13/22 Time HH Agency Contacted: 1713 Representative spoke with at [place]: [place] Arrangements/Services Living arrangements for the past 2 months: [place] you feel safe going back to the place where you live?: Yes      Need for Family Participation in Patient Care: Yes (Comment) Care giver support system in place?: Yes (comment) Current home services: Home PT, Home OT Criminal Activity/Legal Involvement Pertinent to Current Situation/Hospitalization: No - Comment as needed  Activities of Daily Living Home Assistive Devices/Equipment: Walker (specify type) ADL Screening  (condition at time of admission) Patient's cognitive ability adequate to safely complete daily activities?: No Is the patient deaf or have difficulty hearing?: No Does the patient have difficulty seeing, even when wearing glasses/contacts?: Yes Does the patient have difficulty concentrating, remembering, or making decisions?: Yes Patient able to express need for assistance with ADLs?: Yes Does the patient have difficulty dressing or bathing?: No Independently performs ADLs?: No Communication: Needs assistance Dressing (OT): Needs assistance Grooming: Needs assistance Feeding: Independent Bathing: Needs assistance Toileting: Needs assistance In/Out Bed: Needs assistance Walks in Home: Independent with device (comment) Does the patient have difficulty walking or climbing stairs?: Yes Weakness of Legs: Both Weakness of Arms/Hands: None  Permission Sought/Granted                  Emotional Assessment         Alcohol / Substance Use: Not Applicable Psych Involvement: No (comment)  Admission diagnosis:  CHF (congestive heart failure) (HCC) [I50.9] Acute respiratory failure with hypoxia (HCC) [J96.01] Hypervolemia, unspecified hypervolemia type [E87.70] COPD exacerbation ([place]) [J44.1] Patient Active Problem List   Diagnosis Date Noted   History of DVT (deep vein thrombosis) 02/13/2022   COPD exacerbation ([place]) 02/13/2022   Acute on chronic diastolic CHF (congestive heart failure) ([place]) 02/12/2022   CHF (congestive heart failure) ([place]) 02/12/2022   UTI (urinary tract infection) 20/25/4270   Acute metabolic encephalopathy 62/37/6283   Food impaction of esophagus 01/29/2021   CKD (chronic kidney disease) stage 4, GFR 15-29 ml/min ([place]) 01/20/2021   Closed  intertrochanteric fracture of hip, left, sequela 01/20/2021   Dementia without behavioral disturbance ([place]) 01/20/2021   Renal insufficiency 06/18/2019   Sleep apnea    Essential hypertension    Hyperlipidemia 08/28/2017    History of syncope 08/28/2017   Acute frontal sinusitis 09/20/2013   Obstructive sleep apnea 02/08/2011   RENAL INSUFFICIENCY 09/29/2007   Type 2 diabetes mellitus with hyperlipidemia ([place]) 03/31/2007   OBESITY 03/31/2007   Venous insufficiency (chronic) (peripheral) 03/31/2007   Asthmatic bronchitis , chronic 03/31/2007   Seasonal and perennial allergic rhinitis 03/31/2007   COPD with acute exacerbation ([place]) 03/31/2007   G E R D 03/31/2007   PCP:  Ginger Organ., MD Pharmacy:   [place], [address] [place] [address] Phone: [phone] Fax: [phone]  [id number] - [address], [address] [place] [address] Phone: [phone] Fax: [place], [place] - [address] [place] [address] Phone: [phone] Fax: [phone]     Social Determinants of Health (SDOH) Interventions    Readmission Risk Interventions     No data to display

## 2022-02-13 NOTE — Assessment & Plan Note (Addendum)
Continue blood pressure control with carvedilol and isosorbide.  Hydralazine 50 mg po tid.  Hold on loop diuretic for now.

## 2022-02-13 NOTE — Progress Notes (Signed)
Patient does not wish to use CPAP

## 2022-02-13 NOTE — Assessment & Plan Note (Signed)
Today patient with no signs of hypervolemia.  Echocardiogram with preserved LV systolic function with EF 70 to 44%, RV systolic function preserved. RVSP 41.6. trivial pericardial effusion.   Systolic blood pressure 458 to 170 mmHg.  Plan to continue blood pressure control with carvedilol and isosorbide. Hold on furosemide for now.

## 2022-02-13 NOTE — Progress Notes (Signed)
Patient reports she stays alone at home during day and someone stays with her at night. Uses walker at baseline.

## 2022-02-13 NOTE — Plan of Care (Signed)
  Problem: Education: Goal: Ability to describe self-care measures that may prevent or decrease complications (Diabetes Survival Skills Education) will improve Outcome: Progressing Goal: Individualized Educational Video(s) Outcome: Progressing   Problem: Coping: Goal: Ability to adjust to condition or change in health will improve Outcome: Progressing   Problem: Fluid Volume: Goal: Ability to maintain a balanced intake and output will improve Outcome: Progressing   Problem: Health Behavior/Discharge Planning: Goal: Ability to identify and utilize available resources and services will improve Outcome: Progressing Goal: Ability to manage health-related needs will improve Outcome: Progressing   Problem: Metabolic: Goal: Ability to maintain appropriate glucose levels will improve Outcome: Progressing   Problem: Nutritional: Goal: Maintenance of adequate nutrition will improve Outcome: Progressing Goal: Progress toward achieving an optimal weight will improve Outcome: Progressing   Problem: Skin Integrity: Goal: Risk for impaired skin integrity will decrease Outcome: Progressing   Problem: Tissue Perfusion: Goal: Adequacy of tissue perfusion will improve Outcome: Progressing   Problem: Education: Goal: Knowledge of General Education information will improve Description: Including pain rating scale, medication(s)/side effects and non-pharmacologic comfort measures Outcome: Progressing   Problem: Health Behavior/Discharge Planning: Goal: Ability to manage health-related needs will improve Outcome: Progressing   Problem: Clinical Measurements: Goal: Ability to maintain clinical measurements within normal limits will improve Outcome: Progressing Goal: Will remain free from infection Outcome: Progressing Goal: Diagnostic test results will improve Outcome: Progressing Goal: Respiratory complications will improve Outcome: Progressing Goal: Cardiovascular complication will  be avoided Outcome: Progressing   Problem: Activity: Goal: Risk for activity intolerance will decrease Outcome: Progressing   Problem: Nutrition: Goal: Adequate nutrition will be maintained Outcome: Progressing   Problem: Coping: Goal: Level of anxiety will decrease Outcome: Progressing   Problem: Elimination: Goal: Will not experience complications related to bowel motility Outcome: Progressing Goal: Will not experience complications related to urinary retention Outcome: Progressing   Problem: Pain Managment: Goal: General experience of comfort will improve Outcome: Progressing   Problem: Safety: Goal: Ability to remain free from injury will improve Outcome: Progressing   Problem: Skin Integrity: Goal: Risk for impaired skin integrity will decrease Outcome: Progressing   Problem: Education: Goal: Ability to demonstrate management of disease process will improve Outcome: Progressing Goal: Ability to verbalize understanding of medication therapies will improve Outcome: Progressing Goal: Individualized Educational Video(s) Outcome: Progressing   Problem: Activity: Goal: Capacity to carry out activities will improve Outcome: Progressing   Problem: Cardiac: Goal: Ability to achieve and maintain adequate cardiopulmonary perfusion will improve Outcome: Progressing   

## 2022-02-13 NOTE — Assessment & Plan Note (Signed)
Continue with olanzapine and quetiapine.

## 2022-02-13 NOTE — Progress Notes (Addendum)
Progress Note   Patient: [name] [name]:[id number] DOB: 1929/10/06 DOA: 02/12/2022     0 DOS: the patient was seen and examined on 02/13/2022   Brief hospital course: Mrs. Claudio was admitted to the hospital with the working diagnosis of decompensated heart failure.   88 yo female with the past medical history of hypertension, heart failure, asthma, CKD stage IV, chronic DVT and dementia who presented with dyspnea and cough. Symptoms started 7 days after Thanksgiving. Her symptoms consisted in cough and fever, her primary care provider prescribed Augmentin and prednisone, with no improvement in her symptoms. On the day of admission she was having dyspnea with minimal efforts that prompted her family to bring her to the ED. On her initial physical examination her blood pressure was 219/72, HR 58, RR 20, 02 saturation 93%, lungs with rales and increased work of breathing, heart with S1 and S2 present and regular, abdomen with no distention, no lower extremity edema.   Na 138, K 4,2 Cl 114 bicarbonate 19 glucose 174, bun 28 cr 2,13  BNP 1,045  High sensitive troponin 20 and 19  Wbc 3,6 hgb 10,2 plt 128  Sars covid 19 negative, Influenza A and B negative   Chest radiograph with mild cardiomegaly with no hilar vascular congestion, infiltrates or effusions.   EKG 63 bpm, normal axis, right bundle branch block, sinus rhythm with no significant ST segment or T wave changes.   Patient was placed on furosemide for diuresis.  Systemic steroids and bronchodilator therapy.     Assessment and Plan: * COPD with acute exacerbation ([place]) Patient continue to have cough and increase sputum production. Dyspnea not back to baseline.   Plan to add IV corticosteroids with methylprednisolone 40 mg bid Aggressive bronchodilator therapy and inhaled corticosteroids.  5 days of oral azithromycin.  Airway clearing techniques with flutter valve and incentive spirometry. Out of bed to chair tid with meals,  pt and ot.   Acute on chronic diastolic CHF (congestive heart failure) ([place]) Today patient with no signs of hypervolemia.  Echocardiogram with preserved LV systolic function with EF 70 to 46%, RV systolic function preserved. RVSP 41.6. trivial pericardial effusion.   Systolic blood pressure 503 to 170 mmHg.  Plan to continue blood pressure control with carvedilol and isosorbide. Hold on furosemide for now.   CKD (chronic kidney disease) stage 4, GFR 15-29 ml/min (HCC) Her volume status has improved, renal function today with a serum cr at 2,36 with K at 4,2 and serum bicarbonate at 22. Na 141.   Plan to continue close follow up renal function and electrolytes. Hold on furosemide for now.   Type 2 diabetes mellitus with hyperlipidemia (HCC) Uncontrolled with hyperglycemia  Follow up glucose is 228. Continue with insulin sliding scale for glucose cover and monitoring.  Basal insulin with 10 units.   Continue with statin therapy.   Essential hypertension Continue blood pressure control with carvedilol and isosorbide.  Hydralazine 50 mg po tid.  Hold on loop diuretic for now.   OBESITY Calculated BMI is 33.1 consistent with class 1 obesity.   History of DVT (deep vein thrombosis) Continue anticoagulation with apixaban.   Dementia without behavioral disturbance ([place]) Continue with olanzapine and quetiapine.         Subjective: Patient continue to have cough and dyspnea, increase sputum production   Physical Exam: Vitals:   02/13/22 0414 02/13/22 0735 02/13/22 0814 02/13/22 1045  BP: (!) 177/72 (!) 172/68    Pulse: 72 65  Resp: 18 18    Temp: 98.4 F (36.9 C) 98.1 F (36.7 C)  98.4 F (36.9 C)  TempSrc: Oral Oral  Oral  SpO2: 93% 93% 93%   Weight:      Height:       Neurology awake and alert ENT with mild pallor Cardiovascular with S1 and S2 present and rhythmic Respiratory with prolonged expiratory phase with scattered rhonchi with no wheezing or  rales Abdomen with no distention  No lower extremity edema  Data Reviewed:    Family Communication: no family at the bedisde   Disposition: Status is: Observation The patient will require care spanning > 2 midnights and should be moved to inpatient because: IV steroids.   Planned Discharge Destination: Home      Author: Tawni Millers, MD 02/13/2022 1:48 PM  For on call review [email or web address].

## 2022-02-13 NOTE — Progress Notes (Signed)
   02/13/22 0735  Vitals  Temp 98.1 F (36.7 C)  Temp Source Oral  BP (!) 172/68  MAP (mmHg) 96  BP Location Left Arm  BP Method Automatic  Patient Position (if appropriate) Lying  Pulse Rate 65  Pulse Rate Source Monitor  ECG Heart Rate 66  Resp 18  MEWS COLOR  MEWS Score Color Green  Oxygen Therapy  SpO2 93 %  O2 Device Room Air  MEWS Score  MEWS Temp 0  MEWS Systolic 0  MEWS Pulse 0  MEWS RR 0  MEWS LOC 0  MEWS Score 0

## 2022-02-13 NOTE — Progress Notes (Signed)
PROGRESS NOTE    [name] [name]  ZDG:[id number] DOB: Jan 02, 1930 DOA: 02/12/2022 PCP: Ginger Organ., MD  88/F with history of chronic diastolic CHF, COPD/asthma, CKD 4, chronic DVT, dementia, hypertension, presented to the ED with cough and dyspnea.  History of fever and cough PTA, prescribed steroids and Augmentin by PCP without improvement. -In the ED labs noted creatinine of 2.1, BNP 1045, WBC 3.6, COVID-19, flu PCR was negative, chest x-ray noted mild cardiomegaly, no pulmonary vascular congestion or infiltrates   Subjective:   Assessment and Plan:  COPD with acute exacerbation  -History of productive cough, dyspnea, fever  -Currently on IV steroids, bronchodilators, azithromycin  -Continue flutter valve, incentive spirometry  -Increase activity, PT OT   Acute on chronic diastolic CHF .  Echo with EF 70 to 59%, RV systolic function preserved. RVSP 41.6.  -Continue carvedilol and isosorbide. Hold on furosemide for now.   AKI on CKD 3a -Baseline creatinine around 1.5-1.6, creatinine higher than baseline now -Lasix on hold today  Type 2 diabetes mellitus with hyperlipidemia (HCC) Uncontrolled with hyperglycemia -On basal insulin sliding scale  Essential hypertension Continue carvedilol and isosorbide.  Hydralazine 50 mg po tid.  Hold on loop diuretic for now.   OBESITY Calculated BMI is 33.1 consistent with class 1 obesity.   History of DVT (deep vein thrombosis) Continue anticoagulation with apixaban.   Dementia without behavioral disturbance ([place]) Continue with olanzapine and quetiapine.   DVT prophylaxis: Apixaban Code Status: DNR Family Communication: Disposition Plan:   Consultants:    Procedures:   Antimicrobials:    Objective: Vitals:   02/13/22 0814 02/13/22 1045 02/13/22 1443 02/13/22 1544  BP:    (!) 173/69  Pulse:    65  Resp:    17  Temp:  98.4 F (36.9 C)    TempSrc:  Oral    SpO2: 93%  94% 93%  Weight:      Height:         Intake/Output Summary (Last 24 hours) at 02/13/2022 1717 Last data filed at 02/13/2022 1439 Gross per 24 hour  Intake 360 ml  Output 450 ml  Net -90 ml   Filed Weights   02/12/22 1004 02/12/22 2115 02/13/22 0020  Weight: 69.9 kg 87.6 kg 87.5 kg    Examination:  General exam: Appears calm and comfortable  Respiratory system: Clear to auscultation Cardiovascular system: S1 & S2 heard, RRR.  Abd: nondistended, soft and nontender.Normal bowel sounds heard. Central nervous system: Alert and oriented. No focal neurological deficits. Extremities: no edema Skin: No rashes Psychiatry:  Mood & affect appropriate.     Data Reviewed:   CBC: Recent Labs  Lab 02/12/22 1115 02/12/22 1516  WBC 3.6*  --   NEUTROABS 2.3  --   HGB 10.2* 10.2*  HCT 32.3* 30.0*  MCV 97.6  --   PLT 128*  --    Basic Metabolic Panel: Recent Labs  Lab 02/12/22 1115 02/12/22 1516 02/13/22 0115  NA 138 141 141  K 4.2 4.3 4.2  CL 114*  --  110  CO2 19*  --  22  GLUCOSE 174*  --  228*  BUN 28*  --  35*  CREATININE 2.13*  --  2.36*  CALCIUM 9.8  --  10.4*   GFR: Estimated Creatinine Clearance: 17 mL/min (A) (by C-G formula based on SCr of 2.36 mg/dL (H)). Liver Function Tests: No results for input(s): "AST", "ALT", "ALKPHOS", "BILITOT", "PROT", "ALBUMIN" in the last 168 hours. No results for  input(s): "LIPASE", "AMYLASE" in the last 168 hours. No results for input(s): "AMMONIA" in the last 168 hours. Coagulation Profile: No results for input(s): "INR", "PROTIME" in the last 168 hours. Cardiac Enzymes: No results for input(s): "CKTOTAL", "CKMB", "CKMBINDEX", "TROPONINI" in the last 168 hours. BNP (last 3 results) No results for input(s): "PROBNP" in the last 8760 hours. HbA1C: Recent Labs    02/12/22 1443  HGBA1C 7.4*   CBG: Recent Labs  Lab 02/12/22 1644 02/12/22 2113 02/13/22 0609 02/13/22 1054 02/13/22 1541  GLUCAP 223* 191* 169* 147* 249*   Lipid Profile: No results for  input(s): "CHOL", "HDL", "LDLCALC", "TRIG", "CHOLHDL", "LDLDIRECT" in the last 72 hours. Thyroid Function Tests: No results for input(s): "TSH", "T4TOTAL", "FREET4", "T3FREE", "THYROIDAB" in the last 72 hours. Anemia Panel: Recent Labs    02/12/22 1358 02/12/22 1443  TIBC 298  --   IRON 38  --   RETICCTPCT  --  1.8   Urine analysis:    Component Value Date/Time   COLORURINE YELLOW 02/12/2022 [place] 02/12/2022 1443   LABSPEC 1.011 02/12/2022 1443   PHURINE 7.0 02/12/2022 1443   GLUCOSEU NEGATIVE 02/12/2022 1443   HGBUR NEGATIVE 02/12/2022 1443   BILIRUBINUR NEGATIVE 02/12/2022 1443   KETONESUR NEGATIVE 02/12/2022 1443   PROTEINUR 100 (A) 02/12/2022 1443   UROBILINOGEN 1.0 07/25/2009 1045   NITRITE NEGATIVE 02/12/2022 1443   LEUKOCYTESUR TRACE (A) 02/12/2022 1443   Sepsis Labs: @LABRCNTIP (procalcitonin:4,lacticidven:4)  ) Recent Results (from the past 240 hour(s))  Resp Panel by RT-PCR (Flu A&B, Covid) Anterior Nasal Swab     Status: None   Collection Time: 02/12/22 10:24 AM   Specimen: Anterior Nasal Swab  Result Value Ref Range Status   SARS Coronavirus 2 by RT PCR NEGATIVE NEGATIVE Final    Comment: (NOTE) SARS-CoV-2 target nucleic acids are NOT DETECTED.  The SARS-CoV-2 RNA is generally detectable in upper respiratory specimens during the acute phase of infection. The lowest concentration of SARS-CoV-2 viral copies this assay can detect is 138 copies/mL. A negative result does not preclude SARS-Cov-2 infection and should not be used as the sole basis for treatment or other patient management decisions. A negative result may occur with  improper specimen collection/handling, submission of specimen other than nasopharyngeal swab, presence of viral mutation(s) within the areas targeted by this assay, and inadequate number of viral copies(<138 copies/mL). A negative result must be combined with clinical observations, patient history, and  epidemiological information. The expected result is Negative.  Fact Sheet for Patients:  EntrepreneurPulse.com.au  Fact Sheet for Healthcare Providers:  IncredibleEmployment.be  This test is no t yet approved or cleared by the [place] FDA and  has been authorized for detection and/or diagnosis of SARS-CoV-2 by FDA under an Emergency Use Authorization (EUA). This EUA will remain  in effect (meaning this test can be used) for the duration of the COVID-19 declaration under Section 564(b)(1) of the Act, 21 U.S.C.section 360bbb-3(b)(1), unless the authorization is terminated  or revoked sooner.       Influenza A by PCR NEGATIVE NEGATIVE Final   Influenza B by PCR NEGATIVE NEGATIVE Final    Comment: (NOTE) The Xpert Xpress SARS-CoV-2/FLU/RSV plus assay is intended as an aid in the diagnosis of influenza from Nasopharyngeal swab specimens and should not be used as a sole basis for treatment. Nasal washings and aspirates are unacceptable for Xpert Xpress SARS-CoV-2/FLU/RSV testing.  Fact Sheet for Patients: EntrepreneurPulse.com.au  Fact Sheet for Healthcare Providers: IncredibleEmployment.be  This test  is not yet approved or cleared by the [place] and has been authorized for detection and/or diagnosis of SARS-CoV-2 by FDA under an Emergency Use Authorization (EUA). This EUA will remain in effect (meaning this test can be used) for the duration of the COVID-19 declaration under Section 564(b)(1) of the Act, 21 U.S.C. section 360bbb-3(b)(1), unless the authorization is terminated or revoked.  Performed at [facility], [address] [place],  [place] [address]      Radiology Studies: ECHOCARDIOGRAM COMPLETE  Result Date: 02/13/2022    ECHOCARDIOGRAM REPORT   Patient Name:   [name] [name] Date of Exam: 02/13/2022 Medical Rec #:  [id number]        Height:       64.0 in Accession #:     [id number]       Weight:       192.9 lb Date of Birth:  06-Jul-1929        BSA:          1.927 m Patient Age:    10 years         BP:           172/68 mmHg Patient Gender: F                HR:           59 bpm. Exam Location:  Inpatient Procedure: 2D Echo, 3D Echo, Color Doppler, Cardiac Doppler and Strain Analysis Indications:    CHF-Acute Diastolic Y18.56  History:        Patient has prior history of Echocardiogram examinations, most                 recent 09/10/2017. CHF, Signs/Symptoms:Syncope; Risk                 Factors:Diabetes, Sleep Apnea, Hypertension, Dyslipidemia and                 Non-Smoker.  Sonographer:    Greer Pickerel Referring Phys: Lequita Halt  Sonographer Comments: Image acquisition challenging due to respiratory motion and Image acquisition challenging due to patient body habitus. Global longitudinal strain was attempted. IMPRESSIONS  1. Left ventricular ejection fraction, by estimation, is 70 to 75%. The left ventricle has hyperdynamic function. The left ventricle has no regional wall motion abnormalities. Left ventricular diastolic parameters are consistent with Grade II diastolic dysfunction (pseudonormalization).  2. Right ventricular systolic function is normal. The right ventricular size is normal. There is mildly elevated pulmonary artery systolic pressure. The estimated right ventricular systolic pressure is 31.4 mmHg.  3. The mitral valve is degenerative. No evidence of mitral valve regurgitation. No evidence of mitral stenosis. Moderate mitral annular calcification.  4. The aortic valve is tricuspid. There is mild calcification of the aortic valve. Aortic valve regurgitation is not visualized. Aortic valve sclerosis/calcification is present, without any evidence of aortic stenosis.  5. The inferior vena cava is dilated in size with >50% respiratory variability, suggesting right atrial pressure of 8 mmHg. FINDINGS  Left Ventricle: Left ventricular ejection fraction, by estimation, is 70  to 75%. The left ventricle has hyperdynamic function. The left ventricle has no regional wall motion abnormalities. Global longitudinal strain performed but not reported based on interpreter judgement due to suboptimal tracking. The left ventricular internal cavity size was normal in size. There is no left ventricular hypertrophy. Left ventricular diastolic parameters are consistent with Grade II diastolic dysfunction (pseudonormalization). Right Ventricle: The right ventricular size is normal. No  increase in right ventricular wall thickness. Right ventricular systolic function is normal. There is mildly elevated pulmonary artery systolic pressure. The tricuspid regurgitant velocity is 2.90  m/s, and with an assumed right atrial pressure of 8 mmHg, the estimated right ventricular systolic pressure is 96.7 mmHg. Left Atrium: Left atrial size was normal in size. Right Atrium: Right atrial size was normal in size. Pericardium: Trivial pericardial effusion is present. Mitral Valve: The mitral valve is degenerative in appearance. Moderate mitral annular calcification. No evidence of mitral valve regurgitation. No evidence of mitral valve stenosis. Tricuspid Valve: The tricuspid valve is grossly normal. Tricuspid valve regurgitation is mild . No evidence of tricuspid stenosis. Aortic Valve: The aortic valve is tricuspid. There is mild calcification of the aortic valve. Aortic valve regurgitation is not visualized. Aortic valve sclerosis/calcification is present, without any evidence of aortic stenosis. Pulmonic Valve: The pulmonic valve was grossly normal. Pulmonic valve regurgitation is not visualized. No evidence of pulmonic stenosis. Aorta: The aortic root and ascending aorta are structurally normal, with no evidence of dilitation. Venous: The inferior vena cava is dilated in size with greater than 50% respiratory variability, suggesting right atrial pressure of 8 mmHg. IAS/Shunts: The atrial septum is grossly normal.   LEFT VENTRICLE PLAX 2D LVIDd:         4.30 cm   Diastology LVIDs:         3.10 cm   LV e' medial:    8.81 cm/s LV PW:         1.00 cm   LV E/e' medial:  14.0 LV IVS:        0.90 cm   LV e' lateral:   8.70 cm/s LVOT diam:     2.00 cm   LV E/e' lateral: 14.1 LV SV:         110 LV SV Index:   57 LVOT Area:     3.14 cm                           3D Volume EF:                          3D EF:        60 %                          LV EDV:       90 ml                          LV ESV:       36 ml                          LV SV:        55 ml RIGHT VENTRICLE RV S prime:     14.30 cm/s TAPSE (M-mode): 2.9 cm LEFT ATRIUM             Index        RIGHT ATRIUM           Index LA diam:        4.50 cm 2.34 cm/m   RA Area:     20.10 cm LA Vol (A2C):   32.1 ml 16.66 ml/m  RA Volume:   55.10 ml  28.60 ml/m LA Vol (A4C):   59.9 ml 31.09  ml/m LA Biplane Vol: 46.1 ml 23.93 ml/m  AORTIC VALVE LVOT Vmax:   141.00 cm/s LVOT Vmean:  103.000 cm/s LVOT VTI:    0.349 m  AORTA Ao Root diam: 3.30 cm Ao Asc diam:  3.60 cm MITRAL VALVE                TRICUSPID VALVE MV Area (PHT): 3.03 cm     TR Peak grad:   33.6 mmHg MV Decel Time: 250 msec     TR Vmax:        290.00 cm/s MV E velocity: 123.00 cm/s MV A velocity: 98.50 cm/s   SHUNTS MV E/A ratio:  1.25         Systemic VTI:  0.35 m                             Systemic Diam: 2.00 cm Eleonore Chiquito MD Electronically signed by Eleonore Chiquito MD Signature Date/Time: 02/13/2022/10:44:02 AM    Final    US RENAL  Result Date: 02/12/2022 CLINICAL DATA:  Acute kidney injury. EXAM: RENAL / URINARY TRACT ULTRASOUND COMPLETE COMPARISON:  CT abdomen and pelvis 01/28/2021. Renal ultrasound 11/19/2019. FINDINGS: Right Kidney: Renal measurements: 8.5 x 4.1 x 4.2 cm = volume: 75 mL. Increased parenchymal echogenicity. No mass or hydronephrosis. Left Kidney: Renal measurements: 8.5 x 4.5 x 4.7 cm = volume: 95 mL. Increased parenchymal echogenicity. No hydronephrosis. 2.4 cm cyst upper pole cyst, not  significantly changed from 2021. Bladder: Appears normal for degree of bladder distention. Other: None. IMPRESSION: Small, echogenic kidneys compatible with medical renal disease. No hydronephrosis. Electronically Signed   By: Logan Bores M.D.   On: 02/12/2022 16:09   DG Chest Portable 1 View  Result Date: 02/12/2022 CLINICAL DATA:  Cough, shortness of breath EXAM: PORTABLE CHEST 1 VIEW COMPARISON:  02/18/2021 FINDINGS: Transverse diameter of heart is increased. There are no signs of pulmonary edema or focal pulmonary consolidation. There is no pleural effusion or pneumothorax. Surgical clips are noted at the gastroesophageal junction and epigastrium. IMPRESSION: Cardiomegaly. There are no signs of pulmonary edema or focal pulmonary consolidation. Electronically Signed   By: Elmer Picker M.D.   On: 02/12/2022 11:25     Scheduled Meds:  acetaminophen  500 mg Oral BID   apixaban  2.5 mg Oral BID   azithromycin  500 mg Oral Daily   benzonatate  100 mg Oral TID   carvedilol  3.125 mg Oral BID WC   hydrALAZINE  50 mg Oral Q8H   insulin aspart  0-15 Units Subcutaneous TID WC   insulin glargine-yfgn  10 Units Subcutaneous Daily   ipratropium-albuterol  3 mL Nebulization Q6H   isosorbide mononitrate  30 mg Oral Daily   methylPREDNISolone (SOLU-MEDROL) injection  80 mg Intravenous Q1400   mometasone-formoterol  2 puff Inhalation BID   pantoprazole  40 mg Oral Daily   QUEtiapine  12.5 mg Oral QHS   senna-docusate  1 tablet Oral BID   simvastatin  40 mg Oral QPM   sodium chloride flush  3 mL Intravenous Q12H   Continuous Infusions:  sodium chloride       LOS: 0 days    Time spent: 17min    Domenic Polite, MD [facility]   02/13/2022, 5:17 PM

## 2022-02-13 NOTE — Progress Notes (Signed)
TR[facility] night cross cover note:  CBG this evening greater than 300. I added QHS Sliding scale coverage.     Babs Bertin, DO Hospitalist

## 2022-02-13 NOTE — Evaluation (Signed)
Physical Therapy Evaluation Patient Details Name: [name] [name] MRN: [id number] DOB: 1929-06-17 Today's Date: 02/13/2022  History of Present Illness  88 yo female presents to [facility] on 12/4 from home with non-productive cough, HTN, increased LE edema.  Chest x-ray consistent with volume overload, workup for HF exacerbation. PMH includes HTN, chronic HFpEF, mild intermittent asthma, OSA on CPAP HS, CKD stage IV, IDDM, DVT on Eliquis, L hip IMN 01/2021, Alzheimer's dementia.  Clinical Impression   Pt presents with generalized weakness, impaired balance with history of falls, impaired gait, and decreased activity tolerance vs baseline. Pt to benefit from acute PT to address deficits. Pt ambulated very short room distance with RW, overall requiring light physical assist for bed mobility, transfers, and short-distance gait. Pt self-limiting this session as pt's lunch is here and she wants to eat, anticipate pt can ambulate hallway distance. Pt states she has 24/7 assist from her children, confirmed this with chart review, pt appropriate for HHPT and 24/7 assist from family at d/c. PT to progress mobility as tolerated, and will continue to follow acutely.      SPO2 93% and greater on RA, + wheezing breath sounds with multiple coughing bouts during session    Recommendations for follow up therapy are one component of a multi-disciplinary discharge planning process, led by the attending physician.  Recommendations may be updated based on patient status, additional functional criteria and insurance authorization.  Follow Up Recommendations Home health PT      Assistance Recommended at Discharge Frequent or constant Supervision/Assistance  Patient can return home with the following  A little help with walking and/or transfers;A little help with bathing/dressing/bathroom    Equipment Recommendations None recommended by PT  Recommendations for Other Services       Functional Status Assessment Patient  has had a recent decline in their functional status and demonstrates the ability to make significant improvements in function in a reasonable and predictable amount of time.     Precautions / Restrictions Precautions Precautions: Fall Restrictions Weight Bearing Restrictions: No      Mobility  Bed Mobility Overal bed mobility: Needs Assistance Bed Mobility: Supine to Sit     Supine to sit: Min assist, HOB elevated     General bed mobility comments: assist for trunk elevation via HHA, scooting LEs to EOB.    Transfers Overall transfer level: Needs assistance Equipment used: Rolling walker (2 wheels) Transfers: Sit to/from Stand Sit to Stand: Min assist           General transfer comment: light rise and steady assist    Ambulation/Gait Ambulation/Gait assistance: Min assist Gait Distance (Feet): 5 Feet Assistive device: Rolling walker (2 wheels) Gait Pattern/deviations: Step-through pattern, Decreased stride length, Trunk flexed Gait velocity: decr     General Gait Details: steadying assist, cues for placement in RW and room navigation. Pt eager to get to recliner to eat lunch  Stairs            Wheelchair Mobility    Modified Rankin (Stroke Patients Only)       Balance Overall balance assessment: Needs assistance, History of Falls Sitting-balance support: No upper extremity supported, Feet supported Sitting balance-Leahy Scale: Fair     Standing balance support: Bilateral upper extremity supported, During functional activity, Reliant on assistive device for balance Standing balance-Leahy Scale: Poor Standing balance comment: reliant on RW at baseline  Pertinent Vitals/Pain Pain Assessment Pain Assessment: Faces Faces Pain Scale: Hurts a little bit Pain Location: generalized Pain Descriptors / Indicators: Discomfort Pain Intervention(s): Limited activity within patient's tolerance, Monitored during  session, Repositioned    Home Living Family/patient expects to be discharged to:: Private residence Living Arrangements: Children Available Help at Discharge: Family;Available 24 hours/day (son and daughter) Type of Home: House Home Access: Ramped entrance       Home Layout: One level Home Equipment: Rolling Walker (2 wheels);Grab bars - toilet;Grab bars - tub/shower;Shower seat      Prior Function Prior Level of Function : Needs assist             Mobility Comments: pt reports using RW for mobility ADLs Comments: pt reports her children cook and clean for her, and assist with bathing as needed     Hand Dominance   Dominant Hand: Right    Extremity/Trunk Assessment   Upper Extremity Assessment Upper Extremity Assessment: Defer to OT evaluation    Lower Extremity Assessment Lower Extremity Assessment: Generalized weakness    Cervical / Trunk Assessment Cervical / Trunk Assessment: Kyphotic  Communication   Communication: No difficulties  Cognition Arousal/Alertness: Awake/alert Behavior During Therapy: WFL for tasks assessed/performed Overall Cognitive Status: History of cognitive impairments - at baseline                                 General Comments: history of dementia, answers questions appropriately although no family at bedside to confirm or deny        General Comments General comments (skin integrity, edema, etc.): SPO2 93% and greater on RA, + wheezing breath sounds with multiple coughing bouts during session    Exercises     Assessment/Plan    PT Assessment Patient needs continued PT services  PT Problem List Decreased strength;Decreased range of motion;Decreased activity tolerance;Decreased balance;Decreased mobility;Decreased safety awareness;Decreased knowledge of use of DME;Decreased cognition;Pain       PT Treatment Interventions DME instruction;Therapeutic activities;Gait training;Therapeutic exercise;Patient/family  education;Balance training;Stair training;Functional mobility training;Neuromuscular re-education    PT Goals (Current goals can be found in the Care Plan section)  Acute Rehab PT Goals PT Goal Formulation: With patient Time For Goal Achievement: 02/27/22 Potential to Achieve Goals: Good    Frequency Min 3X/week     Co-evaluation               AM-PAC PT "6 Clicks" Mobility  Outcome Measure Help needed turning from your back to your side while in a flat bed without using bedrails?: A Little Help needed moving from lying on your back to sitting on the side of a flat bed without using bedrails?: A Little Help needed moving to and from a bed to a chair (including a wheelchair)?: A Little Help needed standing up from a chair using your arms (e.g., wheelchair or bedside chair)?: A Little Help needed to walk in hospital room?: A Little Help needed climbing 3-5 steps with a railing? : A Lot 6 Click Score: 17    End of Session   Activity Tolerance: Patient limited by fatigue Patient left: in chair;with call bell/phone within reach;with chair alarm set Nurse Communication: Mobility status PT Visit Diagnosis: Other abnormalities of gait and mobility (R26.89);Muscle weakness (generalized) (M62.81)    Time: 9629-5284 PT Time Calculation (min) (ACUTE ONLY): 18 min   Charges:   PT Evaluation $PT Eval Low Complexity: 1 Low  Stacie Glaze, PT DPT Acute Rehabilitation Services Pager [phone]  Office [phone]   Louis Matte 02/13/2022, 12:56 PM

## 2022-02-13 NOTE — Assessment & Plan Note (Signed)
Her volume status has improved, renal function today with a serum cr at 2,36 with K at 4,2 and serum bicarbonate at 22. Na 141.   Plan to continue close follow up renal function and electrolytes. Hold on furosemide for now.

## 2022-02-13 NOTE — Assessment & Plan Note (Signed)
Continue anticoagulation with apixaban.  ?

## 2022-02-13 NOTE — Assessment & Plan Note (Signed)
Calculated BMI is 33.1 consistent with class 1 obesity.

## 2022-02-13 NOTE — Assessment & Plan Note (Addendum)
Uncontrolled with hyperglycemia  Follow up glucose is 228. Continue with insulin sliding scale for glucose cover and monitoring.  Basal insulin with 10 units.   Continue with statin therapy.

## 2022-02-13 NOTE — Progress Notes (Signed)
Heart Failure Navigator Progress Note  Assessed for Heart & Vascular TOC clinic readiness.  Patient does not meet criteria due to alzheimer's dementia.   HF Navigation team will sign-off.   Pricilla Holm, MSN, RN Heart Failure Nurse Navigator

## 2022-02-13 NOTE — Assessment & Plan Note (Addendum)
Patient continue to have cough and increase sputum production. Dyspnea not back to baseline.   Plan to add IV corticosteroids with methylprednisolone 40 mg bid Aggressive bronchodilator therapy and inhaled corticosteroids.  5 days of oral azithromycin.  Airway clearing techniques with flutter valve and incentive spirometry. Out of bed to chair tid with meals, pt and ot.

## 2022-02-13 NOTE — Progress Notes (Signed)
Spoke with patient's daughter [name]. Admission questions completed. Per daughter, patient lives at home with her and also her son.

## 2022-02-13 NOTE — Hospital Course (Addendum)
[name] [name] was admitted to the hospital with the working diagnosis of decompensated heart failure.   88 yo female with the past medical history of hypertension, heart failure, asthma, CKD stage IV, chronic DVT and dementia who presented with dyspnea and cough. Symptoms started 7 days after Thanksgiving. Her symptoms consisted in cough and fever, her primary care provider prescribed Augmentin and prednisone, with no improvement in her symptoms. On the day of admission she was having dyspnea with minimal efforts that prompted her family to bring her to the ED. On her initial physical examination her blood pressure was 219/72, HR 58, RR 20, 02 saturation 93%, lungs with rales and increased work of breathing, heart with S1 and S2 present and regular, abdomen with no distention, no lower extremity edema.   Na 138, K 4,2 Cl 114 bicarbonate 19 glucose 174, bun 28 cr 2,13  BNP 1,045  High sensitive troponin 20 and 19  Wbc 3,6 hgb 10,2 plt 128  Sars covid 19 negative, Influenza A and B negative   Chest radiograph with mild cardiomegaly with no hilar vascular congestion, infiltrates or effusions.   EKG 63 bpm, normal axis, right bundle branch block, sinus rhythm with no significant ST segment or T wave changes.   Patient was placed on furosemide for diuresis.  Systemic steroids and bronchodilator therapy.

## 2022-02-14 ENCOUNTER — Inpatient Hospital Stay (HOSPITAL_COMMUNITY): Payer: Medicare Other

## 2022-02-14 DIAGNOSIS — J441 Chronic obstructive pulmonary disease with (acute) exacerbation: Secondary | ICD-10-CM | POA: Diagnosis not present

## 2022-02-14 LAB — BASIC METABOLIC PANEL
Anion gap: 9 (ref 5–15)
BUN: 47 mg/dL — ABNORMAL HIGH (ref 8–23)
CO2: 18 mmol/L — ABNORMAL LOW (ref 22–32)
Calcium: 10.3 mg/dL (ref 8.9–10.3)
Chloride: 107 mmol/L (ref 98–111)
Creatinine, Ser: 2.32 mg/dL — ABNORMAL HIGH (ref 0.44–1.00)
GFR, Estimated: 19 mL/min — ABNORMAL LOW (ref >60)
Glucose, Bld: 285 mg/dL — ABNORMAL HIGH (ref 70–99)
Potassium: 4 mmol/L (ref 3.5–5.1)
Sodium: 134 mmol/L — ABNORMAL LOW (ref 135–145)

## 2022-02-14 LAB — GLUCOSE, CAPILLARY
Glucose-Capillary: 240 mg/dL — ABNORMAL HIGH (ref 70–99)
Glucose-Capillary: 253 mg/dL — ABNORMAL HIGH (ref 70–99)
Glucose-Capillary: 159 mg/dL — ABNORMAL HIGH (ref 70–99)

## 2022-02-14 MED ORDER — METHYLPREDNISOLONE SODIUM SUCC 125 MG IJ SOLR
60.0000 mg | Freq: Every day | INTRAMUSCULAR | Status: DC
  Administered 2022-02-14: 60 mg via INTRAVENOUS
  Filled 2022-02-14: qty 2

## 2022-02-14 MED ORDER — HYDRALAZINE HCL 25 MG PO TABS
25.0000 mg | ORAL_TABLET | Freq: Three times a day (TID) | ORAL | Status: DC
  Administered 2022-02-14: 25 mg via ORAL
  Filled 2022-02-14: qty 1

## 2022-02-14 MED ORDER — INSULIN ASPART 100 UNIT/ML IJ SOLN
2.0000 [IU] | Freq: Three times a day (TID) | INTRAMUSCULAR | Status: DC
  Administered 2022-02-14 – 2022-02-16 (×4): 2 [IU] via SUBCUTANEOUS

## 2022-02-14 MED ORDER — FUROSEMIDE 10 MG/ML IJ SOLN
40.0000 mg | Freq: Two times a day (BID) | INTRAMUSCULAR | Status: DC
  Administered 2022-02-14: 40 mg via INTRAVENOUS
  Filled 2022-02-14: qty 4

## 2022-02-14 MED ORDER — LABETALOL HCL 5 MG/ML IV SOLN
20.0000 mg | Freq: Once | INTRAVENOUS | Status: AC
  Administered 2022-02-14: 20 mg via INTRAVENOUS

## 2022-02-14 MED ORDER — INSULIN GLARGINE-YFGN 100 UNIT/ML ~~LOC~~ SOLN
15.0000 [IU] | Freq: Every day | SUBCUTANEOUS | Status: DC
  Administered 2022-02-15: 15 [IU] via SUBCUTANEOUS
  Filled 2022-02-14 (×2): qty 0.15

## 2022-02-14 MED ORDER — HYDRALAZINE HCL 20 MG/ML IJ SOLN
10.0000 mg | Freq: Once | INTRAMUSCULAR | Status: AC
  Administered 2022-02-14: 10 mg via INTRAVENOUS
  Filled 2022-02-14: qty 1

## 2022-02-14 MED ORDER — HYDRALAZINE HCL 50 MG PO TABS
50.0000 mg | ORAL_TABLET | Freq: Three times a day (TID) | ORAL | Status: DC
  Administered 2022-02-14 – 2022-02-15 (×5): 50 mg via ORAL
  Filled 2022-02-14 (×5): qty 1

## 2022-02-14 MED ORDER — LABETALOL HCL 5 MG/ML IV SOLN
INTRAVENOUS | Status: AC
  Filled 2022-02-14: qty 4

## 2022-02-14 MED ORDER — GLUCERNA SHAKE PO LIQD
237.0000 mL | Freq: Three times a day (TID) | ORAL | Status: DC
  Administered 2022-02-14 – 2022-02-19 (×11): 237 mL via ORAL

## 2022-02-14 MED ORDER — HYDRALAZINE HCL 20 MG/ML IJ SOLN: 10.0000 mg | INTRAMUSCULAR | Status: DC | PRN

## 2022-02-14 NOTE — Progress Notes (Signed)
   02/14/22 0615  Vitals  Temp 98 F (36.7 C)  Temp Source Oral  BP (!) 214/80  MAP (mmHg) 118  BP Location Right Arm  Pulse Rate 76  Pulse Rate Source Monitor  Resp 18  MEWS COLOR  MEWS Score Color Yellow  Oxygen Therapy  SpO2 96 %  O2 Device Room Air  Height and Weight  Weight 86.2 kg  Type of Scale Used Bed  BMI (Calculated) 32.6  MEWS Score  MEWS Temp 0  MEWS Systolic 2  MEWS Pulse 0  MEWS RR 0  MEWS LOC 0  MEWS Score 2  Provider Notification  Provider Name/Title J.Howerter MD  Date Provider Notified 02/14/22  Time Provider Notified 0630  Method of Notification Page  Notification Reason Other (Comment) (Yellow mews)  Provider response See new orders  Date of Provider Response 02/14/22  Time of Provider Response [phone]

## 2022-02-14 NOTE — Progress Notes (Signed)
Initial Nutrition Assessment  DOCUMENTATION CODES:      INTERVENTION:  Encourage adequate PO intake Glucerna Shake po TID, each supplement provides 220 kcal and 10 grams of protein  NUTRITION DIAGNOSIS:   Increased nutrient needs related to acute illness as evidenced by estimated needs.  GOAL:   Patient will meet greater than or equal to 90% of their needs  MONITOR:   PO intake, Labs, Weight trends, Supplement acceptance, I & O's  REASON FOR ASSESSMENT:   Malnutrition Screening Tool    ASSESSMENT:   Pt admitted with COPD exacerbation and decompensated HF. PMH significant for HTN, HF, asthma, CKD stage IV, chronic DVT, and dementia.   Unsuccessful attempt to reach pt via phone call to room. Unable to obtain detailed nutrition related history at this time.   Per MST assessment in flowsheets, pt's daughter reports that she has lost 100 lbs but has gained back 25-30 lbs. She has been eating poorly d/t improperly fitting dentures d/t significant weight loss.   Meal completions: 12/5: 75% breakfast, 50% lunch, 75% dinner  CBG increased likely d/t addition of steroids. Given pt is consuming 50-75% of meals, will add nutrition supplements to increase nutritional adequacy.   Unfortunately, there is limited documentation of weight history on file to review. Her weight 1 year ago on 02/18/21 was 69.9 kg. Current weight is 86.2 kg. Suspect her current weight may be elevated d/t presence of edema.   Edema: moderate pitting BLE   Medications: zithromax, IV lasix 40mg  BID, SSI 0-15 units TID, semglee 10 units daily, solu-medrol, protonix, senna  Labs: sodium 134, BUN 47, Cr 2.32, GFR 19, HgbA1c 7.4%, CBG's 147-309 x24 hours  UOP: 1185ml x24 hours I/O's: -631ml since admit  NUTRITION - FOCUSED PHYSICAL EXAM: RD working remotely. Deferred to follow up.   Diet Order:   Diet Order             Diet Heart Room service appropriate? Yes; Fluid consistency: Thin; Fluid restriction:  1800 mL Fluid  Diet effective now                   EDUCATION NEEDS:   No education needs have been identified at this time  Skin:  Skin Assessment: Reviewed RN Assessment  Last BM:  12/4  Height:   Ht Readings from Last 1 Encounters:  02/12/22 5\' 4"  (1.626 m)    Weight:   Wt Readings from Last 1 Encounters:  02/14/22 86.2 kg    Ideal Body Weight:  54.5 kg  BMI:  Body mass index is 32.62 kg/m.  Estimated Nutritional Needs:   Kcal:  1400-1600  Protein:  70-85g  Fluid:  >/=1.5L  Clayborne Dana, RDN, LDN Clinical Nutrition

## 2022-02-14 NOTE — Progress Notes (Signed)
   02/14/22 1638  Assess: MEWS Score  BP (!) 185/87  MAP (mmHg) 110  Pulse Rate (!) 116  ECG Heart Rate (!) 117  Resp (!) 26  Assess: MEWS Score  MEWS Temp 0  MEWS Systolic 0  MEWS Pulse 2  MEWS RR 2  MEWS LOC 0  MEWS Score 4  MEWS Score Color Red  Assess: if the MEWS score is Yellow or Red  Were vital signs taken at a resting state? Yes  Focused Assessment No change from prior assessment  Does the patient meet 2 or more of the SIRS criteria? Yes  Does the patient have a confirmed or suspected source of infection? No  Provider and Rapid Response Notified? Yes  MEWS guidelines implemented *See Row Information* No, vital signs rechecked  Notify: Charge Nurse/RN  Name of Charge Nurse/RN Notified Natalie RN  Date Charge Nurse/RN Notified 02/14/22  Time Charge Nurse/RN Notified [place]  Provider Notification  Provider Name/Title Josephy  Date Provider Notified 02/14/22  Time Provider Notified 1644  Method of Notification Page  Notification Reason Critical Result  Provider response See new orders  Date of Provider Response 02/14/22  Time of Provider Response 9211  Notify: Rapid Response  Name of Rapid Response RN Notified [facility] RN  Date Rapid Response Notified 02/14/22  Time Rapid Response Notified 9417  Assess: SIRS CRITERIA  SIRS Temperature  0  SIRS Pulse 1  SIRS Respirations  1  SIRS WBC 1  SIRS Score Sum  3

## 2022-02-14 NOTE — Plan of Care (Signed)
  Problem: Coping: Goal: Ability to adjust to condition or change in health will improve Outcome: Progressing   Problem: Clinical Measurements: Goal: Respiratory complications will improve Outcome: Progressing   Problem: Activity: Goal: Risk for activity intolerance will decrease Outcome: Progressing   Problem: Safety: Goal: Ability to remain free from injury will improve Outcome: Progressing   

## 2022-02-14 NOTE — Evaluation (Signed)
Occupational Therapy Evaluation Patient Details Name: [name] [name] MRN: [id number] DOB: [birth year] Today's Date: 02/14/2022   History of Present Illness 88 yo female presents to [facility] on 12/4 from home with non-productive cough, HTN, increased LE edema.  Chest x-ray consistent with volume overload, workup for HF exacerbation. PMH includes HTN, chronic HFpEF, mild intermittent asthma, OSA on CPAP HS, CKD stage IV, IDDM, DVT on Eliquis, L hip IMN 01/2021, Alzheimer's dementia.   Clinical Impression   Pt reports needing assist at baseline with LB ADLs and IADLs, uses RW for mobility and children provide 24/7 assist for the pt. Pt needing set up-mod A for ADLs, min A for transfers, and min A for bed mobility at time of evaluation. HR up to 135bpm with step pivot transfer to chair, RN aware. Pt presenting with impairments listed below, will follow acutely to maximize independence with ADLs and functional mobility. Recommend HHOT at d/c.      Recommendations for follow up therapy are one component of a multi-disciplinary discharge planning process, led by the attending physician.  Recommendations may be updated based on patient status, additional functional criteria and insurance authorization.   Follow Up Recommendations  Home health OT     Assistance Recommended at Discharge Frequent or constant Supervision/Assistance  Patient can return home with the following A little help with walking and/or transfers;A lot of help with bathing/dressing/bathroom;Assistance with cooking/housework;Assist for transportation;Help with stairs or ramp for entrance;Direct supervision/assist for medications management;Direct supervision/assist for financial management    Functional Status Assessment  Patient has had a recent decline in their functional status and demonstrates the ability to make significant improvements in function in a reasonable and predictable amount of time.  Equipment Recommendations  None  recommended by OT (pt has all needed DME)    Recommendations for Other Services PT consult     Precautions / Restrictions Precautions Precautions: Fall Precaution Comments: watch HR Restrictions Weight Bearing Restrictions: No      Mobility Bed Mobility Overal bed mobility: Needs Assistance Bed Mobility: Supine to Sit     Supine to sit: Min assist, HOB elevated     General bed mobility comments: assist for trunk elevation via HHA, scooting LEs to EOB.    Transfers Overall transfer level: Needs assistance Equipment used: Rolling walker (2 wheels) Transfers: Sit to/from Stand, Bed to chair/wheelchair/BSC Sit to Stand: Min assist     Step pivot transfers: Min assist     General transfer comment: light rise and steady assist      Balance Overall balance assessment: Needs assistance, History of Falls Sitting-balance support: No upper extremity supported, Feet supported Sitting balance-Leahy Scale: Fair     Standing balance support: Bilateral upper extremity supported, During functional activity, Reliant on assistive device for balance Standing balance-Leahy Scale: Poor Standing balance comment: reliant on RW at baseline                           ADL either performed or assessed with clinical judgement   ADL Overall ADL's : Needs assistance/impaired Eating/Feeding: Set up;Sitting   Grooming: Set up;Sitting   Upper Body Bathing: Moderate assistance   Lower Body Bathing: Moderate assistance   Upper Body Dressing : Moderate assistance   Lower Body Dressing: Moderate assistance   Toilet Transfer: Rolling walker (2 wheels);Ambulation;Regular Toilet;Minimal assistance   Toileting- Clothing Manipulation and Hygiene: Maximal assistance;Sit to/from stand       Functional mobility during ADLs: Rolling walker (2 wheels);Minimal  assistance       Vision   Vision Assessment?: No apparent visual deficits     Perception Perception Perception  Tested?: No   Praxis Praxis Praxis tested?: Not tested    Pertinent Vitals/Pain Pain Assessment Pain Assessment: Faces Pain Score: 4  Faces Pain Scale: Hurts little more Pain Location: generalized Pain Descriptors / Indicators: Discomfort Pain Intervention(s): Limited activity within patient's tolerance, Monitored during session, Repositioned     Hand Dominance Right   Extremity/Trunk Assessment Upper Extremity Assessment Upper Extremity Assessment: Generalized weakness (bilateral tremors at baseline)   Lower Extremity Assessment Lower Extremity Assessment: Defer to PT evaluation   Cervical / Trunk Assessment Cervical / Trunk Assessment: Kyphotic   Communication Communication Communication: No difficulties   Cognition Arousal/Alertness: Awake/alert Behavior During Therapy: WFL for tasks assessed/performed Overall Cognitive Status: History of cognitive impairments - at baseline                                 General Comments: history of dementia, answers questions appropriately although no family at bedside to confirm or deny     General Comments  HR up to 135 with mobility    Exercises     Shoulder Instructions      Home Living Family/patient expects to be discharged to:: Private residence Living Arrangements: Children Available Help at Discharge: Family;Available 24 hours/day Type of Home: House Home Access: Ramped entrance     Home Layout: One level     Bathroom Shower/Tub: Teacher, early years/pre: Standard Bathroom Accessibility: Yes   Home Equipment: Rolling Walker (2 wheels);Grab bars - toilet;Grab bars - tub/shower;Shower seat   Additional Comments: children provide around the clock assistance      Prior Functioning/Environment Prior Level of Function : Needs assist             Mobility Comments: pt reports using RW for mobility ADLs Comments: pt reports her children cook and clean for her, and assist with  bathing as needed, manage meds and assist with transportation        OT Problem List: Decreased strength;Decreased range of motion;Decreased activity tolerance;Impaired balance (sitting and/or standing)      OT Treatment/Interventions: Self-care/ADL training;Therapeutic exercise;Energy conservation;DME and/or AE instruction;Therapeutic activities;Balance training;Patient/family education    OT Goals(Current goals can be found in the care plan section) Acute Rehab OT Goals Patient Stated Goal: none stated OT Goal Formulation: With patient Time For Goal Achievement: 02/28/22 Potential to Achieve Goals: Good ADL Goals Pt Will Perform Upper Body Dressing: with supervision;sitting Pt Will Perform Lower Body Dressing: with min assist;sit to/from stand;sitting/lateral leans Pt Will Transfer to Toilet: with supervision;ambulating;regular height toilet Additional ADL Goal #1: pt will be able to stand x5 min for functional task in order to improve activity tolerance for ADLs  OT Frequency: Min 2X/week    Co-evaluation              AM-PAC OT "6 Clicks" Daily Activity     Outcome Measure Help from another person eating meals?: None Help from another person taking care of personal grooming?: A Little Help from another person toileting, which includes using toliet, bedpan, or urinal?: A Lot Help from another person bathing (including washing, rinsing, drying)?: A Lot Help from another person to put on and taking off regular upper body clothing?: A Little Help from another person to put on and taking off regular lower body clothing?: A Lot 6  Click Score: 16   End of Session Equipment Utilized During Treatment: Gait belt;Rolling walker (2 wheels) Nurse Communication: Mobility status  Activity Tolerance: Patient tolerated treatment well Patient left: in chair;with call bell/phone within reach;with chair alarm set  OT Visit Diagnosis: Unsteadiness on feet (R26.81);Other abnormalities of  gait and mobility (R26.89);Muscle weakness (generalized) (M62.81)                Time: 1224-8250 OT Time Calculation (min): 23 min Charges:  OT General Charges $OT Visit: 1 Visit OT Evaluation $OT Eval Moderate Complexity: 1 Mod OT Treatments $Self Care/Home Management : 8-22 mins  Renaye Rakers, OTD, OTR/L SecureChat Preferred Acute Rehab [phone]  [place] Maish K Koonce 02/14/2022, 9:20 AM

## 2022-02-14 NOTE — Progress Notes (Signed)
[facility] night cross cover note:   I was notified by RN that the patient saw systolic blood pressure 144 mmHg.  Reported associated acute symptoms.  This is relative to most recent prior systolic blood pressure in the 180s.  Patient's RN has just given a dose of scheduled oral hydralazine.  Per chart review, it appears that she is also scheduled for dose of Coreg at 8 AM.  I asked that the schedule dose of Coreg be given now and I have also ordered a one-time dose of IV hydralazine x 1 now.     Babs Bertin, DO Hospitalist

## 2022-02-14 NOTE — Progress Notes (Signed)
Notified MD of BP 234/87 .  HR 123. New orders place and executed.

## 2022-02-15 LAB — CBC
HCT: 33.8 % — ABNORMAL LOW (ref 36.0–46.0)
Hemoglobin: 11 g/dL — ABNORMAL LOW (ref 12.0–15.0)
MCH: 30.6 pg (ref 26.0–34.0)
MCHC: 32.5 g/dL (ref 30.0–36.0)
MCV: 94.2 fL (ref 80.0–100.0)
Platelets: 183 10*3/uL (ref 150–400)
RBC: 3.59 MIL/uL — ABNORMAL LOW (ref 3.87–5.11)
RDW: 13.1 % (ref 11.5–15.5)
WBC: 5 10*3/uL (ref 4.0–10.5)
nRBC: 0 % (ref 0.0–0.2)

## 2022-02-15 LAB — GLUCOSE, CAPILLARY
Glucose-Capillary: 182 mg/dL — ABNORMAL HIGH (ref 70–99)
Glucose-Capillary: 224 mg/dL — ABNORMAL HIGH (ref 70–99)
Glucose-Capillary: 237 mg/dL — ABNORMAL HIGH (ref 70–99)
Glucose-Capillary: 245 mg/dL — ABNORMAL HIGH (ref 70–99)
Glucose-Capillary: 288 mg/dL — ABNORMAL HIGH (ref 70–99)
Glucose-Capillary: 330 mg/dL — ABNORMAL HIGH (ref 70–99)

## 2022-02-15 LAB — BASIC METABOLIC PANEL
Anion gap: 12 (ref 5–15)
BUN: 52 mg/dL — ABNORMAL HIGH (ref 8–23)
CO2: 18 mmol/L — ABNORMAL LOW (ref 22–32)
Calcium: 10.4 mg/dL — ABNORMAL HIGH (ref 8.9–10.3)
Chloride: 107 mmol/L (ref 98–111)
Creatinine, Ser: 2.66 mg/dL — ABNORMAL HIGH (ref 0.44–1.00)
GFR, Estimated: 17 mL/min — ABNORMAL LOW (ref >60)
Glucose, Bld: 301 mg/dL — ABNORMAL HIGH (ref 70–99)
Potassium: 4.5 mmol/L (ref 3.5–5.1)
Sodium: 137 mmol/L (ref 135–145)

## 2022-02-15 MED ORDER — ISOSORBIDE MONONITRATE ER 60 MG PO TB24
60.0000 mg | ORAL_TABLET | Freq: Every day | ORAL | Status: DC
  Administered 2022-02-15: 60 mg via ORAL
  Filled 2022-02-15: qty 1

## 2022-02-15 MED ORDER — PREDNISONE 20 MG PO TABS
40.0000 mg | ORAL_TABLET | Freq: Every day | ORAL | Status: DC
  Administered 2022-02-15 – 2022-02-16 (×2): 40 mg via ORAL
  Filled 2022-02-15 (×2): qty 2

## 2022-02-15 NOTE — Progress Notes (Signed)
Pt not wearing BIPAP

## 2022-02-15 NOTE — Progress Notes (Signed)
   02/15/22 1500  Mobility  Activity Transferred from chair to bed  Level of Assistance Minimal assist, patient does 75% or more  Assistive Device Front wheel walker  Distance Ambulated (ft) 2 ft  Activity Response Tolerated well  Mobility Referral Yes  $Mobility charge 1 Mobility   Mobility Specialist Progress Note  Pt requesting to get back to bed. Had no c/o pain throughout transfer. Left in bed w/ all needs met and call bell in reach.  Lucious Groves Mobility Specialist  Please contact via SecureChat or Rehab office at [phone]

## 2022-02-15 NOTE — Progress Notes (Signed)
Physical Therapy Treatment Patient Details Name: [name] [name] MRN: [id number] DOB: Sep 18, 1929 Today's Date: 02/15/2022   History of Present Illness 88 yo female presents to [facility] on 12/4 from home with non-productive cough, HTN, increased LE edema.  Chest x-ray consistent with volume overload, workup for HF exacerbation. PMH includes HTN, chronic HFpEF, mild intermittent asthma, OSA on CPAP HS, CKD stage IV, IDDM, DVT on Eliquis, L hip IMN 01/2021, Alzheimer's dementia.    PT Comments    Pt dozing on entry and very confused when awakened. Blinds were down in the room and pt could not determine what time of day it was. When asked if she wanted to get up for lunch, pt reports it's too late for lunch. Once blinds open pt obviously disoriented to time. Once time of day established pt agreeable to getting up to chair. Pt  min guard for bed mobility, and minA for transfers and ambulation in room. In chair pt able to perform seated exercise. With 24 hr care pt d/c plan is appropriate.    Recommendations for follow up therapy are one component of a multi-disciplinary discharge planning process, led by the attending physician.  Recommendations may be updated based on patient status, additional functional criteria and insurance authorization.  Follow Up Recommendations  Home health PT     Assistance Recommended at Discharge Frequent or constant Supervision/Assistance  Patient can return home with the following A little help with walking and/or transfers;A little help with bathing/dressing/bathroom   Equipment Recommendations  None recommended by PT    Recommendations for Other Services       Precautions / Restrictions Precautions Precautions: Fall Precaution Comments: watch HR Restrictions Weight Bearing Restrictions: No     Mobility  Bed Mobility Overal bed mobility: Needs Assistance Bed Mobility: Supine to Sit     Supine to sit: HOB elevated, Min guard     General bed mobility  comments: pt able to come to EoB with increased time and effort and use of bedrail to pull to EoB    Transfers Overall transfer level: Needs assistance Equipment used: Rolling walker (2 wheels) Transfers: Sit to/from Stand, Bed to chair/wheelchair/BSC Sit to Stand: Min assist           General transfer comment: for power up and steadying    Ambulation/Gait Ambulation/Gait assistance: Min assist Gait Distance (Feet): 5 Feet Assistive device: Rolling walker (2 wheels) Gait Pattern/deviations: Step-through pattern, Decreased stride length, Trunk flexed Gait velocity: decr Gait velocity interpretation: <1.31 ft/sec, indicative of household ambulator   General Gait Details: steadying assist, cues for placement in RW and room navigation.          Balance Overall balance assessment: Needs assistance, History of Falls Sitting-balance support: No upper extremity supported, Feet supported Sitting balance-Leahy Scale: Fair     Standing balance support: Bilateral upper extremity supported, During functional activity, Reliant on assistive device for balance Standing balance-Leahy Scale: Poor Standing balance comment: reliant on RW at baseline                            Cognition Arousal/Alertness: Awake/alert Behavior During Therapy: WFL for tasks assessed/performed Overall Cognitive Status: History of cognitive impairments - at baseline                                 General Comments: blinds closed due to workmen on roof can look  in, however pt very confused about time of day, thinks it is 11 at night and does not want to get up for lunch, needs blind open before she believes it is daytime        Exercises General Exercises - Lower Extremity Long Arc Quad: Seated, 10 reps, Both Hip Flexion/Marching: Seated, 10 reps    General Comments General comments (skin integrity, edema, etc.): HR in 110s with transfer and ambulation      Pertinent  Vitals/Pain Pain Assessment Pain Assessment: Faces Faces Pain Scale: Hurts a little bit Pain Location: generalized Pain Descriptors / Indicators: Discomfort Pain Intervention(s): Monitored during session     PT Goals (current goals can now be found in the care plan section) Acute Rehab PT Goals PT Goal Formulation: With patient Time For Goal Achievement: 02/27/22 Potential to Achieve Goals: Good Progress towards PT goals: Progressing toward goals    Frequency    Min 3X/week      PT Plan Current plan remains appropriate       AM-PAC PT "6 Clicks" Mobility   Outcome Measure  Help needed turning from your back to your side while in a flat bed without using bedrails?: A Little Help needed moving from lying on your back to sitting on the side of a flat bed without using bedrails?: A Little Help needed moving to and from a bed to a chair (including a wheelchair)?: A Little Help needed standing up from a chair using your arms (e.g., wheelchair or bedside chair)?: A Little Help needed to walk in hospital room?: A Little Help needed climbing 3-5 steps with a railing? : A Lot 6 Click Score: 17    End of Session Equipment Utilized During Treatment: Gait belt Activity Tolerance: Patient tolerated treatment well Patient left: in chair;with call bell/phone within reach;with chair alarm set Nurse Communication: Mobility status PT Visit Diagnosis: Other abnormalities of gait and mobility (R26.89);Muscle weakness (generalized) (M62.81)     Time: 4270-6237 PT Time Calculation (min) (ACUTE ONLY): 18 min  Charges:  $Therapeutic Activity: 8-22 mins                     Ericka Marcellus B. Migdalia Dk PT, DPT Acute Rehabilitation Services Please use secure chat or  Call Office [phone]    [place] 02/15/2022, 2:02 PM

## 2022-02-15 NOTE — Progress Notes (Signed)
Patient noted to be in a-fib. EKG done to confirm. Rate is 80s-90s. Patient is asymptomatic, VSS. MD notified.

## 2022-02-15 NOTE — Progress Notes (Signed)
PROGRESS NOTE    [name] [name]  VQQ:[id number] DOB: 12/02/1929 DOA: 02/12/2022 PCP: Ginger Organ., MD  89/F with history of chronic diastolic CHF, COPD/asthma, CKD 4, chronic DVT, dementia, hypertension, presented to the ED with cough and dyspnea.  History of fever and cough PTA, prescribed steroids and Augmentin by PCP without improvement. -In the ED labs noted creatinine of 2.1, BNP 1045, WBC 3.6, COVID-19, flu PCR was negative, chest x-ray noted mild cardiomegaly, no pulmonary vascular congestion or infiltrates   Subjective: Overnight with blood pressure issues, feels better today, breathing is improving  Assessment and Plan:  COPD with acute exacerbation  -History of productive cough, dyspnea, fever  -Currently on IV steroids, bronchodilators, azithromycin  -Switch to prednisone taper today -Continue flutter valve, incentive spirometry  -Increase activity, ambulate  Acute on chronic diastolic CHF .  Echo with EF 70 to 43%, RV systolic function preserved. RVSP 41.6.  -Continue carvedilol and isosorbide. -Appears euvolemic, hold further diuretics, creatinine higher  AKI on CKD 3a -Baseline creatinine around 1.5-1.6, creatinine higher than baseline now -See above holding diuretics, avoid hypotension  Type 2 diabetes mellitus with hyperlipidemia (HCC) Uncontrolled with hyperglycemia -Increase glargine, add meal coverage  Uncontrolled type -Continue carvedilol, increase hydralazine and increase isosorbide dose -PRN IV hydralazine   OBESITY Calculated BMI is 33.1 consistent with class 1 obesity.   History of DVT (deep vein thrombosis) Continue anticoagulation with apixaban.   Dementia without behavioral disturbance ([place]) Continue with olanzapine and quetiapine.   DVT prophylaxis: Apixaban Code Status: DNR Family Communication: None present, discussed patient detail Disposition Plan: Home likely 1 to 2 days  Consultants:    Procedures:   Antimicrobials:     Objective: Vitals:   02/14/22 2300 02/15/22 0000 02/15/22 0400 02/15/22 0756  BP: (!) 135/52 (!) 152/59 (!) 154/58 (!) 143/66  Pulse: 79 81 78 87  Resp:   17 20  Temp:   98.7 F (37.1 C) 98.3 F (36.8 C)  TempSrc:   Oral Oral  SpO2:   91% 92%  Weight:   83.8 kg   Height:        Intake/Output Summary (Last 24 hours) at 02/15/2022 1134 Last data filed at 02/15/2022 0300 Gross per 24 hour  Intake 290 ml  Output 1325 ml  Net -1035 ml   Filed Weights   02/13/22 0020 02/14/22 0615 02/15/22 0400  Weight: 87.5 kg 86.2 kg 83.8 kg    Examination:  General exam: Obese pleasant female sitting up in bed, AAOx3, no distress HEENT: No JVD CVS: S1-S2, regular rhythm Lungs: Improving air movement, no wheezes today, rare basilar rhonchi Abdomen: Soft, nontender, bowel sounds present Extremities: No edema  Skin: No rashes Psychiatry:  Mood & affect appropriate.     Data Reviewed:   CBC: Recent Labs  Lab 02/12/22 1115 02/12/22 1516 02/15/22 0059  WBC 3.6*  --  5.0  NEUTROABS 2.3  --   --   HGB 10.2* 10.2* 11.0*  HCT 32.3* 30.0* 33.8*  MCV 97.6  --  94.2  PLT 128*  --  329   Basic Metabolic Panel: Recent Labs  Lab 02/12/22 1115 02/12/22 1516 02/13/22 0115 02/14/22 0051 02/15/22 0059  NA 138 141 141 134* 137  K 4.2 4.3 4.2 4.0 4.5  CL 114*  --  110 107 107  CO2 19*  --  22 18* 18*  GLUCOSE 174*  --  228* 285* 301*  BUN 28*  --  35* 47* 52*  CREATININE 2.13*  --  2.36* 2.32* 2.66*  CALCIUM 9.8  --  10.4* 10.3 10.4*   GFR: Estimated Creatinine Clearance: 14.7 mL/min (A) (by C-G formula based on SCr of 2.66 mg/dL (H)). Liver Function Tests: No results for input(s): "AST", "ALT", "ALKPHOS", "BILITOT", "PROT", "ALBUMIN" in the last 168 hours. No results for input(s): "LIPASE", "AMYLASE" in the last 168 hours. No results for input(s): "AMMONIA" in the last 168 hours. Coagulation Profile: No results for input(s): "INR", "PROTIME" in the last 168 hours. Cardiac  Enzymes: No results for input(s): "CKTOTAL", "CKMB", "CKMBINDEX", "TROPONINI" in the last 168 hours. BNP (last 3 results) No results for input(s): "PROBNP" in the last 8760 hours. HbA1C: Recent Labs    02/12/22 1443  HGBA1C 7.4*   CBG: Recent Labs  Lab 02/14/22 1118 02/14/22 1610 02/14/22 2123 02/15/22 0000 02/15/22 0602  GLUCAP 253* 159* 224* 288* 245*   Lipid Profile: No results for input(s): "CHOL", "HDL", "LDLCALC", "TRIG", "CHOLHDL", "LDLDIRECT" in the last 72 hours. Thyroid Function Tests: No results for input(s): "TSH", "T4TOTAL", "FREET4", "T3FREE", "THYROIDAB" in the last 72 hours. Anemia Panel: Recent Labs    02/12/22 1358 02/12/22 1443  TIBC 298  --   IRON 38  --   RETICCTPCT  --  1.8   Urine analysis:    Component Value Date/Time   COLORURINE YELLOW 02/12/2022 [place] 02/12/2022 1443   LABSPEC 1.011 02/12/2022 1443   PHURINE 7.0 02/12/2022 1443   GLUCOSEU NEGATIVE 02/12/2022 1443   HGBUR NEGATIVE 02/12/2022 1443   BILIRUBINUR NEGATIVE 02/12/2022 1443   KETONESUR NEGATIVE 02/12/2022 1443   PROTEINUR 100 (A) 02/12/2022 1443   UROBILINOGEN 1.0 07/25/2009 1045   NITRITE NEGATIVE 02/12/2022 1443   LEUKOCYTESUR TRACE (A) 02/12/2022 1443   Sepsis Labs: @LABRCNTIP (procalcitonin:4,lacticidven:4)  ) Recent Results (from the past 240 hour(s))  Resp Panel by RT-PCR (Flu A&B, Covid) Anterior Nasal Swab     Status: None   Collection Time: 02/12/22 10:24 AM   Specimen: Anterior Nasal Swab  Result Value Ref Range Status   SARS Coronavirus 2 by RT PCR NEGATIVE NEGATIVE Final    Comment: (NOTE) SARS-CoV-2 target nucleic acids are NOT DETECTED.  The SARS-CoV-2 RNA is generally detectable in upper respiratory specimens during the acute phase of infection. The lowest concentration of SARS-CoV-2 viral copies this assay can detect is 138 copies/mL. A negative result does not preclude SARS-Cov-2 infection and should not be used as the sole basis  for treatment or other patient management decisions. A negative result may occur with  improper specimen collection/handling, submission of specimen other than nasopharyngeal swab, presence of viral mutation(s) within the areas targeted by this assay, and inadequate number of viral copies(<138 copies/mL). A negative result must be combined with clinical observations, patient history, and epidemiological information. The expected result is Negative.  Fact Sheet for Patients:  EntrepreneurPulse.com.au  Fact Sheet for Healthcare Providers:  IncredibleEmployment.be  This test is no t yet approved or cleared by the [place] FDA and  has been authorized for detection and/or diagnosis of SARS-CoV-2 by FDA under an Emergency Use Authorization (EUA). This EUA will remain  in effect (meaning this test can be used) for the duration of the COVID-19 declaration under Section 564(b)(1) of the Act, 21 U.S.C.section 360bbb-3(b)(1), unless the authorization is terminated  or revoked sooner.       Influenza A by PCR NEGATIVE NEGATIVE Final   Influenza B by PCR NEGATIVE NEGATIVE Final    Comment: (NOTE) The Xpert Xpress SARS-CoV-2/FLU/RSV plus assay  is intended as an aid in the diagnosis of influenza from Nasopharyngeal swab specimens and should not be used as a sole basis for treatment. Nasal washings and aspirates are unacceptable for Xpert Xpress SARS-CoV-2/FLU/RSV testing.  Fact Sheet for Patients: EntrepreneurPulse.com.au  Fact Sheet for Healthcare Providers: IncredibleEmployment.be  This test is not yet approved or cleared by the [place] FDA and has been authorized for detection and/or diagnosis of SARS-CoV-2 by FDA under an Emergency Use Authorization (EUA). This EUA will remain in effect (meaning this test can be used) for the duration of the COVID-19 declaration under Section 564(b)(1) of the Act, 21  U.S.C. section 360bbb-3(b)(1), unless the authorization is terminated or revoked.  Performed at [facility], [place] [address] [place], [place] [address]      Radiology Studies: DG CHEST PORT 1 VIEW  Result Date: 02/14/2022 CLINICAL DATA:  Dyspnea. EXAM: PORTABLE CHEST 1 VIEW COMPARISON:  02/12/2022 FINDINGS: Slightly low lung volumes with mildly prominent lung markings that appear chronic. No focal airspace disease or pulmonary edema. Multiple surgical clips near the GE junction region. Evidence for dextroscoliosis in the upper thoracic spine. Heart size is normal. Atherosclerotic calcifications at the aortic arch. IMPRESSION: Chronic lung changes without acute findings. Electronically Signed   By: Markus Daft M.D.   On: 02/14/2022 09:49     Scheduled Meds:  acetaminophen  500 mg Oral BID   apixaban  2.5 mg Oral BID   azithromycin  500 mg Oral Daily   benzonatate  100 mg Oral TID   carvedilol  3.125 mg Oral BID WC   feeding supplement (GLUCERNA SHAKE)  237 mL Oral TID BM   hydrALAZINE  50 mg Oral TID   insulin aspart  0-15 Units Subcutaneous TID WC   insulin aspart  0-5 Units Subcutaneous QHS   insulin aspart  2 Units Subcutaneous TID WC   insulin glargine-yfgn  15 Units Subcutaneous Daily   isosorbide mononitrate  60 mg Oral Daily   methylPREDNISolone (SOLU-MEDROL) injection  60 mg Intravenous Q1400   mometasone-formoterol  2 puff Inhalation BID   pantoprazole  40 mg Oral Daily   QUEtiapine  12.5 mg Oral QHS   senna-docusate  1 tablet Oral BID   simvastatin  40 mg Oral QPM   sodium chloride flush  3 mL Intravenous Q12H   Continuous Infusions:  sodium chloride       LOS: 2 days    Time spent: 64min    Domenic Polite, MD [facility]   02/15/2022, 11:34 AM

## 2022-02-15 NOTE — Progress Notes (Signed)
Pt refusing CPAP

## 2022-02-15 NOTE — Plan of Care (Signed)
  Problem: Coping: Goal: Ability to adjust to condition or change in health will improve Outcome: Progressing   Problem: Clinical Measurements: Goal: Respiratory complications will improve Outcome: Progressing   Problem: Activity: Goal: Risk for activity intolerance will decrease Outcome: Progressing   Problem: Safety: Goal: Ability to remain free from injury will improve Outcome: Progressing   

## 2022-02-16 DIAGNOSIS — I5033 Acute on chronic diastolic (congestive) heart failure: Secondary | ICD-10-CM | POA: Diagnosis not present

## 2022-02-16 DIAGNOSIS — Z7189 Other specified counseling: Secondary | ICD-10-CM | POA: Diagnosis not present

## 2022-02-16 DIAGNOSIS — J441 Chronic obstructive pulmonary disease with (acute) exacerbation: Secondary | ICD-10-CM | POA: Diagnosis not present

## 2022-02-16 DIAGNOSIS — J9601 Acute respiratory failure with hypoxia: Secondary | ICD-10-CM | POA: Diagnosis not present

## 2022-02-16 LAB — BASIC METABOLIC PANEL
Anion gap: 9 (ref 5–15)
BUN: 74 mg/dL — ABNORMAL HIGH (ref 8–23)
CO2: 20 mmol/L — ABNORMAL LOW (ref 22–32)
Calcium: 10.3 mg/dL (ref 8.9–10.3)
Chloride: 105 mmol/L (ref 98–111)
Creatinine, Ser: 3.7 mg/dL — ABNORMAL HIGH (ref 0.44–1.00)
GFR, Estimated: 11 mL/min — ABNORMAL LOW (ref >60)
Glucose, Bld: 294 mg/dL — ABNORMAL HIGH (ref 70–99)
Potassium: 4.7 mmol/L (ref 3.5–5.1)
Sodium: 134 mmol/L — ABNORMAL LOW (ref 135–145)

## 2022-02-16 LAB — GLUCOSE, CAPILLARY
Glucose-Capillary: 261 mg/dL — ABNORMAL HIGH (ref 70–99)
Glucose-Capillary: 177 mg/dL — ABNORMAL HIGH (ref 70–99)
Glucose-Capillary: 338 mg/dL — ABNORMAL HIGH (ref 70–99)
Glucose-Capillary: 241 mg/dL — ABNORMAL HIGH (ref 70–99)

## 2022-02-16 MED ORDER — HYDRALAZINE HCL 25 MG PO TABS
25.0000 mg | ORAL_TABLET | Freq: Three times a day (TID) | ORAL | Status: DC
  Administered 2022-02-16: 25 mg via ORAL
  Filled 2022-02-16: qty 1

## 2022-02-16 MED ORDER — HYDRALAZINE HCL 50 MG PO TABS
50.0000 mg | ORAL_TABLET | Freq: Three times a day (TID) | ORAL | Status: DC
  Administered 2022-02-16 – 2022-02-19 (×10): 50 mg via ORAL
  Filled 2022-02-16 (×10): qty 1

## 2022-02-16 MED ORDER — INSULIN ASPART 100 UNIT/ML IJ SOLN
3.0000 [IU] | Freq: Three times a day (TID) | INTRAMUSCULAR | Status: DC
  Administered 2022-02-16 (×2): 3 [IU] via SUBCUTANEOUS

## 2022-02-16 MED ORDER — ISOSORBIDE MONONITRATE ER 30 MG PO TB24
30.0000 mg | ORAL_TABLET | Freq: Every day | ORAL | Status: DC
  Administered 2022-02-16 – 2022-02-17 (×2): 30 mg via ORAL
  Filled 2022-02-16 (×2): qty 1

## 2022-02-16 MED ORDER — INSULIN GLARGINE-YFGN 100 UNIT/ML ~~LOC~~ SOLN
20.0000 [IU] | Freq: Every day | SUBCUTANEOUS | Status: DC
  Administered 2022-02-16: 20 [IU] via SUBCUTANEOUS
  Filled 2022-02-16 (×2): qty 0.2

## 2022-02-16 MED ORDER — PREDNISONE 20 MG PO TABS
20.0000 mg | ORAL_TABLET | Freq: Every day | ORAL | Status: DC
  Administered 2022-02-17 – 2022-02-19 (×3): 20 mg via ORAL
  Filled 2022-02-16 (×3): qty 1

## 2022-02-16 NOTE — Progress Notes (Signed)
PROGRESS NOTE    [name] [name]  WUJ:[id number] DOB: 12/21/1929 DOA: 02/12/2022 PCP: Ginger Organ., MD  89/F with history of chronic diastolic CHF, COPD/asthma, CKD 4, chronic DVT, dementia, hypertension, presented to the ED with cough and dyspnea.  History of fever and cough PTA, prescribed steroids and Augmentin by PCP without improvement. -In the ED labs noted creatinine of 2.1, BNP 1045, WBC 3.6, COVID-19, flu PCR was negative, chest x-ray noted mild cardiomegaly, no pulmonary vascular congestion or infiltrates   Subjective: Some fluctuation in blood pressure overnight, lowest was 110, otherwise 120-1 60 range  Assessment and Plan:  COPD with acute exacerbation  -History of productive cough, dyspnea, fever  -Treated with IV steroids, bronchodilators, azithromycin  -Taper prednisone -Continue flutter valve, incentive spirometry  -Increase activity, ambulate  Acute on chronic diastolic CHF .  Echo with EF 70 to 95%, RV systolic function preserved. RVSP 41.6.  -Continue carvedilol and isosorbide. -Remains euvolemic diuretics on hold for 48 hours now, creatinine trending up  AKI on CKD 3b -Baseline creatinine around 1.5-2, creatinine higher than baseline now -See above holding diuretics, avoid hypotension -Suspect dynamically mediated with blood pressure fluctuations, will back off on antihypertensives today, renal ultrasound was negative for hydronephrosis -Discussed with patient and daughter, she was previously followed by [name] with [facility], has declined hemodialysis in the past, is followed by palliative care as outpatient, we discussed having a palliative care evaluation while inpatient, daughter was very much in favor of this -Hopefully creatinine plateaus over the next 1 to 2 days  Uncontrolled type -Continue carvedilol, will decrease hydralazine and Imdur dose with worsening AKI, avoid hypotension -PRN IV hydralazine   Type 2 diabetes mellitus  with hyperlipidemia ([place]) Uncontrolled with hyperglycemia -Increased glargine, on meal coverage  OBESITY Calculated BMI is 33.1 consistent with class 1 obesity.   History of DVT (deep vein thrombosis) Continue anticoagulation with apixaban.   Dementia without behavioral disturbance ([place]) Continue with olanzapine and quetiapine.   DVT prophylaxis: Apixaban Code Status: DNR Family Communication: No family at bedside, called and updated daughter Disposition Plan: To be determined  Consultants:    Procedures:   Antimicrobials:    Objective: Vitals:   02/16/22 0536 02/16/22 0825 02/16/22 0849 02/16/22 1046  BP: (!) 167/67 (!) 177/66  (!) 174/47  Pulse: 83 70  92  Resp: 18 12  20   Temp: 98 F (36.7 C) 98 F (36.7 C)  98 F (36.7 C)  TempSrc: Oral Oral  Oral  SpO2: 92% 94% 95% 93%  Weight: 85.5 kg     Height:        Intake/Output Summary (Last 24 hours) at 02/16/2022 1204 Last data filed at 02/16/2022 1114 Gross per 24 hour  Intake 1059 ml  Output 175 ml  Net 884 ml   Filed Weights   02/14/22 0615 02/15/22 0400 02/16/22 0536  Weight: 86.2 kg 83.8 kg 85.5 kg    Examination:  General exam: Obese pleasant female sitting up in bed, AAOx3, no distress HEENT: No JVD CVS: S1-S2, regular rhythm Lungs: Improving air movement, no wheezes today, rare basilar rhonchi Abdomen: Soft, nontender, bowel sounds present Extremities: No edema  Skin: No rashes Psychiatry:  Mood & affect appropriate.     Data Reviewed:   CBC: Recent Labs  Lab 02/12/22 1115 02/12/22 1516 02/15/22 0059  WBC 3.6*  --  5.0  NEUTROABS 2.3  --   --   HGB 10.2* 10.2* 11.0*  HCT 32.3* 30.0* 33.8*  MCV 97.6  --  94.2  PLT 128*  --  299   Basic Metabolic Panel: Recent Labs  Lab 02/12/22 1115 02/12/22 1516 02/13/22 0115 02/14/22 0051 02/15/22 0059 02/16/22 0100  NA 138 141 141 134* 137 134*  K 4.2 4.3 4.2 4.0 4.5 4.7  CL 114*  --  110 107 107 105  CO2 19*  --  22 18* 18* 20*   GLUCOSE 174*  --  228* 285* 301* 294*  BUN 28*  --  35* 47* 52* 74*  CREATININE 2.13*  --  2.36* 2.32* 2.66* 3.70*  CALCIUM 9.8  --  10.4* 10.3 10.4* 10.3   GFR: Estimated Creatinine Clearance: 10.7 mL/min (A) (by C-G formula based on SCr of 3.7 mg/dL (H)). Liver Function Tests: No results for input(s): "AST", "ALT", "ALKPHOS", "BILITOT", "PROT", "ALBUMIN" in the last 168 hours. No results for input(s): "LIPASE", "AMYLASE" in the last 168 hours. No results for input(s): "AMMONIA" in the last 168 hours. Coagulation Profile: No results for input(s): "INR", "PROTIME" in the last 168 hours. Cardiac Enzymes: No results for input(s): "CKTOTAL", "CKMB", "CKMBINDEX", "TROPONINI" in the last 168 hours. BNP (last 3 results) No results for input(s): "PROBNP" in the last 8760 hours. HbA1C: No results for input(s): "HGBA1C" in the last 72 hours.  CBG: Recent Labs  Lab 02/15/22 1143 02/15/22 1556 02/15/22 2108 02/16/22 0535 02/16/22 1044  GLUCAP 182* 237* 330* 261* 177*   Lipid Profile: No results for input(s): "CHOL", "HDL", "LDLCALC", "TRIG", "CHOLHDL", "LDLDIRECT" in the last 72 hours. Thyroid Function Tests: No results for input(s): "TSH", "T4TOTAL", "FREET4", "T3FREE", "THYROIDAB" in the last 72 hours. Anemia Panel: No results for input(s): "VITAMINB12", "FOLATE", "FERRITIN", "TIBC", "IRON", "RETICCTPCT" in the last 72 hours.  Urine analysis:    Component Value Date/Time   COLORURINE YELLOW 02/12/2022 1443   APPEARANCEUR CLEAR 02/12/2022 1443   LABSPEC 1.011 02/12/2022 1443   PHURINE 7.0 02/12/2022 1443   GLUCOSEU NEGATIVE 02/12/2022 1443   HGBUR NEGATIVE 02/12/2022 1443   BILIRUBINUR NEGATIVE 02/12/2022 1443   KETONESUR NEGATIVE 02/12/2022 1443   PROTEINUR 100 (A) 02/12/2022 1443   UROBILINOGEN 1.0 07/25/2009 1045   NITRITE NEGATIVE 02/12/2022 1443   LEUKOCYTESUR TRACE (A) 02/12/2022 1443   Sepsis Labs: @LABRCNTIP (procalcitonin:4,lacticidven:4)  ) Recent Results  (from the past 240 hour(s))  Resp Panel by RT-PCR (Flu A&B, Covid) Anterior Nasal Swab     Status: None   Collection Time: 02/12/22 10:24 AM   Specimen: Anterior Nasal Swab  Result Value Ref Range Status   SARS Coronavirus 2 by RT PCR NEGATIVE NEGATIVE Final    Comment: (NOTE) SARS-CoV-2 target nucleic acids are NOT DETECTED.  The SARS-CoV-2 RNA is generally detectable in upper respiratory specimens during the acute phase of infection. The lowest concentration of SARS-CoV-2 viral copies this assay can detect is 138 copies/mL. A negative result does not preclude SARS-Cov-2 infection and should not be used as the sole basis for treatment or other patient management decisions. A negative result may occur with  improper specimen collection/handling, submission of specimen other than nasopharyngeal swab, presence of viral mutation(s) within the areas targeted by this assay, and inadequate number of viral copies(<138 copies/mL). A negative result must be combined with clinical observations, patient history, and epidemiological information. The expected result is Negative.  Fact Sheet for Patients:  EntrepreneurPulse.com.au  Fact Sheet for Healthcare Providers:  IncredibleEmployment.be  This test is no t yet approved or cleared by the [place] and  has been authorized for  detection and/or diagnosis of SARS-CoV-2 by FDA under an Emergency Use Authorization (EUA). This EUA will remain  in effect (meaning this test can be used) for the duration of the COVID-19 declaration under Section 564(b)(1) of the Act, 21 U.S.C.section 360bbb-3(b)(1), unless the authorization is terminated  or revoked sooner.       Influenza A by PCR NEGATIVE NEGATIVE Final   Influenza B by PCR NEGATIVE NEGATIVE Final    Comment: (NOTE) The Xpert Xpress SARS-CoV-2/FLU/RSV plus assay is intended as an aid in the diagnosis of influenza from Nasopharyngeal swab specimens  and should not be used as a sole basis for treatment. Nasal washings and aspirates are unacceptable for Xpert Xpress SARS-CoV-2/FLU/RSV testing.  Fact Sheet for Patients: EntrepreneurPulse.com.au  Fact Sheet for Healthcare Providers: IncredibleEmployment.be  This test is not yet approved or cleared by the [place] FDA and has been authorized for detection and/or diagnosis of SARS-CoV-2 by FDA under an Emergency Use Authorization (EUA). This EUA will remain in effect (meaning this test can be used) for the duration of the COVID-19 declaration under Section 564(b)(1) of the Act, 21 U.S.C. section 360bbb-3(b)(1), unless the authorization is terminated or revoked.  Performed at [facility], [place] [address], [place], [place] [address]      Radiology Studies: No results found.   Scheduled Meds:  acetaminophen  500 mg Oral BID   apixaban  2.5 mg Oral BID   azithromycin  500 mg Oral Daily   benzonatate  100 mg Oral TID   carvedilol  3.125 mg Oral BID WC   feeding supplement (GLUCERNA SHAKE)  237 mL Oral TID BM   hydrALAZINE  25 mg Oral TID   insulin aspart  0-15 Units Subcutaneous TID WC   insulin aspart  0-5 Units Subcutaneous QHS   insulin aspart  3 Units Subcutaneous TID WC   insulin glargine-yfgn  20 Units Subcutaneous Daily   isosorbide mononitrate  30 mg Oral Daily   mometasone-formoterol  2 puff Inhalation BID   pantoprazole  40 mg Oral Daily   predniSONE  40 mg Oral Q breakfast   QUEtiapine  12.5 mg Oral QHS   senna-docusate  1 tablet Oral BID   simvastatin  40 mg Oral QPM   sodium chloride flush  3 mL Intravenous Q12H   Continuous Infusions:  sodium chloride       LOS: 3 days    Time spent: 32min    Domenic Polite, MD [facility]   02/16/2022, 12:04 PM

## 2022-02-16 NOTE — Plan of Care (Signed)
  Problem: Coping: Goal: Ability to adjust to condition or change in health will improve Outcome: Progressing   Problem: Clinical Measurements: Goal: Respiratory complications will improve Outcome: Progressing   

## 2022-02-16 NOTE — Progress Notes (Signed)
Mobility Specialist Progress Note    02/16/22 1201  Mobility  Activity Ambulated with assistance in room  Level of Assistance Minimal assist, patient does 75% or more  Assistive Device Front wheel walker  Distance Ambulated (ft) 42 ft (18+12+12)  Activity Response Tolerated well  Mobility Referral Yes  $Mobility charge 1 Mobility   Pre-Mobility: 80 HR During Mobility: 93 HR Post-Mobility: 86 HR  Pt received in bed and agreeable. Took x2 seated rest breaks. No complaints. Left in chair with call bell in reach and chair alarm on.   Hildred Alamin Mobility Specialist  Please Contact via SecureChat or Rehab Office at [phone]

## 2022-02-16 NOTE — Progress Notes (Signed)
Pt refused CPAP tonight.

## 2022-02-16 NOTE — Consult Note (Signed)
Consultation Note Date: 02/16/2022   Patient Name: [name] [name]  DOB: 1929/04/25  MRN: [id number]  Age / Sex: 88 y.o., female  PCP: Ginger Organ., MD Referring Physician: Domenic Polite, MD  Reason for Consultation: Establishing goals of care  HPI/Patient Profile: 88 y.o. female  with past medical history of HTN, chronic HFpEF, mild intermittent asthma, OSA on CPAP HS, CKD stage IV, IDDM, DVT on Eliquis, Alzheimer's dementia  admitted on 02/12/2022 with shortness of breath.   Patient was feeling ill for about a week PTA, now admitted for acute on chronic HFpEF, asthma/COPD exacerbation, hypertensive emergency, AKI on CKD3a.  PMT has been consulted to assist with goals of care conversation.  Clinical Assessment and Goals of Care:  I have reviewed medical records including EPIC notes, labs and imaging, received report from RN, assessed the patient and then had a phone conversation with patient's daughter [name] [name] to discuss diagnosis prognosis, [place], EOL wishes, disposition and options.  I introduced Palliative Medicine as specialized medical care for people living with serious illness. It focuses on providing relief from the symptoms and stress of a serious illness. The goal is to improve quality of life for both the patient and the family.  We discussed a brief life review of the patient and then focused on their current illness.  The natural disease trajectory and expectations at EOL were discussed.  I attempted to elicit values and goals of care important to the patient.    Medical History Review and Understanding:  Patient has poor insight into her medical conditions.  Spoke with patient's daughter [name] [name] who after speaking with Dr. Broadus John today, reports a good understanding of patient's acute CHF, AKI on CKD, COPD exacerbation.  We also discussed patient's poor prognosis with baseline dementia that has  worsened this year.  Social History: Patient lives in her own home with daughter and son taking shifts to provide 24/7 support.  Her son went back to work in October and this has gotten much more difficult since then.  She is very spiritual and reports attending [facility] but has been unable to attend in several years.  Patient's daughter [name] [name] reports there are also other children but they are not in contact with the patient.  Functional and Nutritional State: Patient and daughter share that she is able to dress herself, cook for herself, use the restroom and bathe herself with sponge baths and once a week showers.  She ambulates with a cane/walker.  Family reports increased frequency of falls recently.  She has poor oral intake, commenting that her diet at home is usually much softer.  Palliative Symptoms: Dyspnea, cough, likely depression   Code Status: Concepts specific to code status, artifical feeding and hydration, and rehospitalization were considered and discussed.   Discussion: Patient reports having a terrible quality of life since her husband died in 07-02-22.  She feels like she has nobody and although 2 of her children are very supportive, she comments that it is not the same as having her husband and they also have their own families to attend to.  She has never tried grief counseling and reports interest in support through her church.  This has been challenging for her since she has been unable to attend for the past 2 years and they have a new minister that she feels no connection with.  She can't stay awake long enough to even go to church if she had a ride. Patient's daughter feels like she would  like to focus on comfort if she continues to decline given patient's poor quality of life.  Her goal is to keep patient home as long as possible.  They have had a difficult time since patient's husband passed and she notes this has exacerbated her underlying dementia.  They still  have some good days and some bad days.  We discussed her caregiver burden and the importance of anticipatory care planning should she be unable to continue caring for patient at home.  She is in the process of looking for caregivers, although they have had a difficult time in the past with even police being called due to threats from a caregiver.  This is also resulted in her having to cancel some appointments.  She would like to see if patient's kidney function improves or not, as well as inform her brother of family meeting being offered.   Hospice and Palliative Care services outpatient were explained and offered.   Discussed the importance of continued conversation with family and the medical providers regarding overall plan of care and treatment options, ensuring decisions are within the context of the patient's values and GOCs.   Questions and concerns were addressed.  The family was encouraged to call with questions or concerns.  PMT will continue to support holistically.  SUMMARY OF RECOMMENDATIONS   -Continue DNR -Continue current care and allow time for outcomes -Patient's daughter is inclined to focus on comfort if patient continues to decline but would like a family meeting including son [name] [name] before making any final decisions -Family will call PMT with preferred date/time for family meeting -Would recommend grief counseling at discharge if patient is interested -Patient's daughter would like a referral to a different palliative or hospice agency depending on clinical course -Spiritual care consulted at patient's request -Psychosocial and emotional support provided -PMT will continue to follow and support  Prognosis:  Poor prognosis given several acute illnesses and chronic comorbidities in a patient with functional/nutritional decline and advanced age  Discharge Planning: To Be Determined      Primary Diagnoses: Present on Admission:  COPD with acute exacerbation ([place])   Acute on chronic diastolic CHF (congestive heart failure) (H[facility])  CKD (chronic kidney disease) stage 4, GFR 15-29 ml/min ([facility])  Type 2 diabetes mellitus with hyperlipidemia ([facility])  OBESITY  Essential hypertension  Dementia without behavioral disturbance ([facility])  COPD exacerbation ([place])  Physical Exam Vitals and nursing note reviewed.  Constitutional:      General: She is not in acute distress.    Appearance: She is obese. She is ill-appearing.  Cardiovascular:     Rate and Rhythm: Normal rate.  Pulmonary:     Effort: Pulmonary effort is normal. No respiratory distress.  Skin:    General: Skin is warm and dry.  Neurological:     Mental Status: She is alert.  Psychiatric:        Mood and Affect: Affect is tearful.        Behavior: Behavior is cooperative.        Cognition and Memory: Cognition is impaired. Memory is impaired.    Vital Signs: BP (!) 177/66 (BP Location: Left Arm)   Pulse 70   Temp 98 F (36.7 C) (Oral)   Resp 12   Ht 5\' 4"  (1.626 m)   Wt 85.5 kg   SpO2 95%   BMI 32.35 kg/m  Pain Scale: 0-10   Pain Score: 0-No pain  SpO2: SpO2: 95 % O2 Device:SpO2: 95 % O2 Flow Rate: .  Palliative Assessment/Data: 50%    MDM: high   Marquett Bertoli Johnnette Litter, PA-C  Palliative Medicine Team Team phone # [phone]  Thank you for allowing the Palliative Medicine Team to assist in the care of this patient. Please utilize secure chat with additional questions, if there is no response within 30 minutes please call the above phone number.  Palliative Medicine Team providers are available by phone from 7am to 7pm daily and can be reached through the team cell phone.  Should this patient require assistance outside of these hours, please call the patient's attending physician.

## 2022-02-17 DIAGNOSIS — Z515 Encounter for palliative care: Secondary | ICD-10-CM

## 2022-02-17 DIAGNOSIS — Z7189 Other specified counseling: Secondary | ICD-10-CM | POA: Diagnosis not present

## 2022-02-17 LAB — BASIC METABOLIC PANEL
Anion gap: 10 (ref 5–15)
BUN: 79 mg/dL — ABNORMAL HIGH (ref 8–23)
CO2: 21 mmol/L — ABNORMAL LOW (ref 22–32)
Calcium: 10.4 mg/dL — ABNORMAL HIGH (ref 8.9–10.3)
Chloride: 105 mmol/L (ref 98–111)
Creatinine, Ser: 3.33 mg/dL — ABNORMAL HIGH (ref 0.44–1.00)
GFR, Estimated: 13 mL/min — ABNORMAL LOW (ref >60)
Glucose, Bld: 258 mg/dL — ABNORMAL HIGH (ref 70–99)
Potassium: 4.4 mmol/L (ref 3.5–5.1)
Sodium: 136 mmol/L (ref 135–145)

## 2022-02-17 LAB — GLUCOSE, CAPILLARY
Glucose-Capillary: 232 mg/dL — ABNORMAL HIGH (ref 70–99)
Glucose-Capillary: 191 mg/dL — ABNORMAL HIGH (ref 70–99)
Glucose-Capillary: 165 mg/dL — ABNORMAL HIGH (ref 70–99)
Glucose-Capillary: 200 mg/dL — ABNORMAL HIGH (ref 70–99)

## 2022-02-17 MED ORDER — ISOSORBIDE MONONITRATE ER 60 MG PO TB24
60.0000 mg | ORAL_TABLET | Freq: Every day | ORAL | Status: DC
  Administered 2022-02-18 – 2022-02-19 (×2): 60 mg via ORAL
  Filled 2022-02-17 (×2): qty 1

## 2022-02-17 MED ORDER — ISOSORBIDE MONONITRATE ER 30 MG PO TB24
30.0000 mg | ORAL_TABLET | Freq: Once | ORAL | Status: AC
  Administered 2022-02-17: 30 mg via ORAL
  Filled 2022-02-17: qty 1

## 2022-02-17 MED ORDER — INSULIN ASPART 100 UNIT/ML IJ SOLN
5.0000 [IU] | Freq: Three times a day (TID) | INTRAMUSCULAR | Status: DC
  Administered 2022-02-17 – 2022-02-19 (×8): 5 [IU] via SUBCUTANEOUS

## 2022-02-17 MED ORDER — INSULIN GLARGINE-YFGN 100 UNIT/ML ~~LOC~~ SOLN
30.0000 [IU] | Freq: Every day | SUBCUTANEOUS | Status: DC
  Administered 2022-02-17 – 2022-02-19 (×3): 30 [IU] via SUBCUTANEOUS
  Filled 2022-02-17 (×3): qty 0.3

## 2022-02-17 NOTE — Progress Notes (Signed)
PROGRESS NOTE    [name] [name]  OIZ:[id number] DOB: [birth year] DOA: 02/12/2022 PCP: [name] [name]., MD  89/F with history of chronic diastolic CHF, COPD/asthma, CKD 4, chronic DVT, dementia, hypertension, presented to the ED with cough and dyspnea.  History of fever and cough PTA, prescribed steroids and Augmentin by PCP without improvement. -In the ED labs noted creatinine of 2.1, BNP 1045, WBC 3.6, COVID-19, flu PCR was negative, chest x-ray noted mild cardiomegaly, no pulmonary vascular congestion or infiltrates -Creatinine worsened, diuretics held -Palliative care consulted  Subjective: Some cough and intermittent dyspnea  Assessment and Plan:  COPD with acute exacerbation  -History of productive cough, dyspnea, fever  -Treated with IV steroids, bronchodilators, azithromycin  -Taper prednisone -Continue flutter valve, incentive spirometry  -Increase activity, ambulate -Wean off O2  Acute on chronic diastolic CHF .  Echo with EF 70 to 33%, RV systolic function preserved. RVSP 41.6.  -Continue carvedilol and isosorbide. -Appears euvolemic to slightly wet, continue to hold diuretics 1 more day with AKI  AKI on CKD 3b -Baseline creatinine around 1.5-2, creatinine higher than baseline now -See above holding diuretics, avoid hypotension -Suspect dynamically mediated with blood pressure fluctuations, titrated back antihypertensives yesterday, renal ultrasound negative for hydronephrosis  -Discussed with patient and daughter, she was previously followed by Dr. Justin [name] with [facility], has declined hemodialysis in the past, is followed by palliative care as outpatient, -daughter was agreeable and requested palliative care eval inpatient -Creatinine starting to improve, hopefully continues to stabilize  Uncontrolled type -Continue carvedilol, continue current dose of hydralazine, will increase Imdur to 60 Mg -PRN IV hydralazine   Type 2 diabetes mellitus with  hyperlipidemia ([place]) Uncontrolled with hyperglycemia -Increased glargine, on meal coverage  OBESITY Calculated BMI is 33.1 consistent with class 1 obesity.   History of DVT (deep vein thrombosis) Continue anticoagulation with apixaban.   Dementia without behavioral disturbance ([place]) Continue with olanzapine and quetiapine.   DVT prophylaxis: Apixaban Code Status: DNR Family Communication: No family at bedside, called and updated daughter yesterday Disposition Plan: To be determined  Consultants:    Procedures:   Antimicrobials:    Objective: Vitals:   02/16/22 2004 02/17/22 0411 02/17/22 0730 02/17/22 0759  BP:  (!) 170/73  (!) 164/88  Pulse:  83  74  Resp:  18  19  Temp:  97.8 F (36.6 C)  (!) 97.5 F (36.4 C)  TempSrc:  Oral  Oral  SpO2: 95% 94% 93% 94%  Weight:  86.6 kg    Height:        Intake/Output Summary (Last 24 hours) at 02/17/2022 1051 Last data filed at 02/17/2022 8250 Gross per 24 hour  Intake 835 ml  Output 1100 ml  Net -265 ml   Filed Weights   02/15/22 0400 02/16/22 0536 02/17/22 0411  Weight: 83.8 kg 85.5 kg 86.6 kg    Examination:  General exam: Obese elderly female sitting up in bed, AAOx3, no distress HEENT: Neck obese unable to assess JVD CVS: S1-S2, Regular rhythm Lungs: Decreased breath sounds at the bases Abdomen: Soft, nontender, bowel sounds present EXTR: No edema Skin: No rashes on exposed skin Psychiatry:  Mood & affect appropriate.     Data Reviewed:   CBC: Recent Labs  Lab 02/12/22 1115 02/12/22 1516 02/15/22 0059  WBC 3.6*  --  5.0  NEUTROABS 2.3  --   --   HGB 10.2* 10.2* 11.0*  HCT 32.3* 30.0* 33.8*  MCV 97.6  --  94.2  PLT 128*  --  481   Basic Metabolic Panel: Recent Labs  Lab 02/13/22 0115 02/14/22 0051 02/15/22 0059 02/16/22 0100 02/17/22 0039  NA 141 134* 137 134* 136  K 4.2 4.0 4.5 4.7 4.4  CL 110 107 107 105 105  CO2 22 18* 18* 20* 21*  GLUCOSE 228* 285* 301* 294* 258*  BUN 35* 47* 52*  74* 79*  CREATININE 2.36* 2.32* 2.66* 3.70* 3.33*  CALCIUM 10.4* 10.3 10.4* 10.3 10.4*   GFR: Estimated Creatinine Clearance: 12 mL/min (A) (by C-G formula based on SCr of 3.33 mg/dL (H)). Liver Function Tests: No results for input(s): "AST", "ALT", "ALKPHOS", "BILITOT", "PROT", "ALBUMIN" in the last 168 hours. No results for input(s): "LIPASE", "AMYLASE" in the last 168 hours. No results for input(s): "AMMONIA" in the last 168 hours. Coagulation Profile: No results for input(s): "INR", "PROTIME" in the last 168 hours. Cardiac Enzymes: No results for input(s): "CKTOTAL", "CKMB", "CKMBINDEX", "TROPONINI" in the last 168 hours. BNP (last 3 results) No results for input(s): "PROBNP" in the last 8760 hours. HbA1C: No results for input(s): "HGBA1C" in the last 72 hours.  CBG: Recent Labs  Lab 02/16/22 0535 02/16/22 1044 02/16/22 1543 02/16/22 2135 02/17/22 0609  GLUCAP 261* 177* 338* 241* 232*   Lipid Profile: No results for input(s): "CHOL", "HDL", "LDLCALC", "TRIG", "CHOLHDL", "LDLDIRECT" in the last 72 hours. Thyroid Function Tests: No results for input(s): "TSH", "T4TOTAL", "FREET4", "T3FREE", "THYROIDAB" in the last 72 hours. Anemia Panel: No results for input(s): "VITAMINB12", "FOLATE", "FERRITIN", "TIBC", "IRON", "RETICCTPCT" in the last 72 hours.  Urine analysis:    Component Value Date/Time   COLORURINE YELLOW 02/12/2022 1443   APPEARANCEUR CLEAR 02/12/2022 1443   LABSPEC 1.011 02/12/2022 1443   PHURINE 7.0 02/12/2022 1443   GLUCOSEU NEGATIVE 02/12/2022 1443   HGBUR NEGATIVE 02/12/2022 1443   BILIRUBINUR NEGATIVE 02/12/2022 1443   KETONESUR NEGATIVE 02/12/2022 1443   PROTEINUR 100 (A) 02/12/2022 1443   UROBILINOGEN 1.0 07/25/2009 1045   NITRITE NEGATIVE 02/12/2022 1443   LEUKOCYTESUR TRACE (A) 02/12/2022 1443   Sepsis Labs: @LABRCNTIP (procalcitonin:4,lacticidven:4)  ) Recent Results (from the past 240 hour(s))  Resp Panel by RT-PCR (Flu A&B, Covid)  Anterior Nasal Swab     Status: None   Collection Time: 02/12/22 10:24 AM   Specimen: Anterior Nasal Swab  Result Value Ref Range Status   SARS Coronavirus 2 by RT PCR NEGATIVE NEGATIVE Final    Comment: (NOTE) SARS-CoV-2 target nucleic acids are NOT DETECTED.  The SARS-CoV-2 RNA is generally detectable in upper respiratory specimens during the acute phase of infection. The lowest concentration of SARS-CoV-2 viral copies this assay can detect is 138 copies/mL. A negative result does not preclude SARS-Cov-2 infection and should not be used as the sole basis for treatment or other patient management decisions. A negative result may occur with  improper specimen collection/handling, submission of specimen other than nasopharyngeal swab, presence of viral mutation(s) within the areas targeted by this assay, and inadequate number of viral copies(<138 copies/mL). A negative result must be combined with clinical observations, patient history, and epidemiological information. The expected result is Negative.  Fact Sheet for Patients:  EntrepreneurPulse.com.au  Fact Sheet for Healthcare Providers:  IncredibleEmployment.be  This test is no t yet approved or cleared by the [place] FDA and  has been authorized for detection and/or diagnosis of SARS-CoV-2 by FDA under an Emergency Use Authorization (EUA). This EUA will remain  in effect (meaning this test can be used) for the duration  of the COVID-19 declaration under Section 564(b)(1) of the Act, 21 U.S.C.section 360bbb-3(b)(1), unless the authorization is terminated  or revoked sooner.       Influenza A by PCR NEGATIVE NEGATIVE Final   Influenza B by PCR NEGATIVE NEGATIVE Final    Comment: (NOTE) The Xpert Xpress SARS-CoV-2/FLU/RSV plus assay is intended as an aid in the diagnosis of influenza from Nasopharyngeal swab specimens and should not be used as a sole basis for treatment. Nasal washings  and aspirates are unacceptable for Xpert Xpress SARS-CoV-2/FLU/RSV testing.  Fact Sheet for Patients: EntrepreneurPulse.com.au  Fact Sheet for Healthcare Providers: IncredibleEmployment.be  This test is not yet approved or cleared by the [place] FDA and has been authorized for detection and/or diagnosis of SARS-CoV-2 by FDA under an Emergency Use Authorization (EUA). This EUA will remain in effect (meaning this test can be used) for the duration of the COVID-19 declaration under Section 564(b)(1) of the Act, 21 U.S.C. section 360bbb-3(b)(1), unless the authorization is terminated or revoked.  Performed at [facility], [place] [address], [place], [place] [address]      Radiology Studies: No results found.   Scheduled Meds:  acetaminophen  500 mg Oral BID   apixaban  2.5 mg Oral BID   benzonatate  100 mg Oral TID   carvedilol  3.125 mg Oral BID WC   feeding supplement (GLUCERNA SHAKE)  237 mL Oral TID BM   hydrALAZINE  50 mg Oral TID   insulin aspart  0-15 Units Subcutaneous TID WC   insulin aspart  0-5 Units Subcutaneous QHS   insulin aspart  5 Units Subcutaneous TID WC   insulin glargine-yfgn  30 Units Subcutaneous Daily   isosorbide mononitrate  30 mg Oral Daily   mometasone-formoterol  2 puff Inhalation BID   pantoprazole  40 mg Oral Daily   predniSONE  20 mg Oral Q breakfast   QUEtiapine  12.5 mg Oral QHS   senna-docusate  1 tablet Oral BID   simvastatin  40 mg Oral QPM   sodium chloride flush  3 mL Intravenous Q12H   Continuous Infusions:  sodium chloride       LOS: 4 days    Time spent: 69min    Domenic Polite, MD [facility]   02/17/2022, 10:51 AM

## 2022-02-17 NOTE — Progress Notes (Signed)
Palliative Medicine Inpatient Follow Up Note   HPI: 88 y.o. female  with past medical history of HTN, chronic HFpEF, mild intermittent asthma, OSA on CPAP HS, CKD stage IV, IDDM, DVT on Eliquis, Alzheimer's dementia  admitted on 02/12/2022 with shortness of breath.    Patient was feeling ill for about a week PTA, now admitted for acute on chronic HFpEF, asthma/COPD exacerbation, hypertensive emergency, AKI on CKD3a.  PMT has been consulted to assist with goals of care conversation.  Today's Discussion 02/17/2022  *Please note that this is a verbal dictation therefore any spelling or grammatical errors are due to the "[place] [facility]" system interpretation.  Chart reviewed inclusive of vital signs, progress notes, laboratory results, and diagnostic images.   I met this morning with patient's daughter [name] [name] and son [name] [name] to further discuss patient's goals of care.  Per review [name] [name] has been in a tenuous health state since her hip fracture on November 10.  She was able to rehabilitate from that fairly well and has been able to be home contributing to some BADLs such as making her bed.  Her daughter shares that she needs to help her with all other BADLs though [name] [name] does like to maintain a high degree of personal independence.  Reviewed [name] [name]'s history of heart failure, COPD/asthma, kidney disease and dementia.  Discussed that [name] [name] has has suffered from memory impairment for many years and as a result of this has had many falls in the home which she may not always remember.  She is also had a hard time reorienting if she gets thrown out of her daily routine.  On a typical today [name] [name] tends to wake up eat a little bit later around 10 or 11, nap, watch television, eat around 4-5, and go to bed around 8:00.  She does fairly well on this routine.  Discussed patient's daughter [name] [name] is the predominant caregiver for [name] [name] is [name] [name] is gone back to work acknowledge that this can be difficult.  Reviewed  what [name] [name]'s wishes would be if her health continues to decline.  She is vocal about not wanting to be resuscitated, intubated, or have hemodialysis.  She shares that she would want readmission to the hospital, antibiotics, a trial of IV fluids, and a trial of tube feeding if needed.  A MOST form was provided for completion.  Discussed the differences between palliative support and hospice.Patient is understanding to this though her personal goals are to get back home, rehabilitate, and have outpatient palliative support.  Questions and concerns addressed/Palliative Support Provided.   Objective Assessment: Vital Signs Vitals:   02/17/22 0759 02/17/22 1132  BP: (!) 164/88 127/63  Pulse: 74 70  Resp: 19 16  Temp: (!) 97.5 F (36.4 C) 97.6 F (36.4 C)  SpO2: 94% 95%    Intake/Output Summary (Last 24 hours) at 02/17/2022 1347 Last data filed at 02/17/2022 1313 Gross per 24 hour  Intake 596 ml  Output 1101 ml  Net -505 ml   Last Weight  Most recent update: 02/17/2022  4:17 AM    Weight  86.6 kg (190 lb 14.7 oz)            Gen: Elderly Caucasian female in no acute distress HEENT: moist mucous membranes CV: Regular rate and rhythm  PULM: On room air breathing is even and nonlabored ABD: soft/nontender  EXT: Mild bilateral lower extremity edema  Neuro: Alert and oriented x2-3  SUMMARY OF RECOMMENDATIONS   DNAR/DNI, no HD  Plan for MOST  form completion prior to discharge  Open and honest conversations were held with patient's son and daughter regarding her present health state  The differences between palliative care and hospice care were described  Patient is open to outpatient palliative support on discharge  PMT will continue to incrementally follow  Time Spent: 57  Billing based on MDM: High ______________________________________________________________________________________ [facility] Team Team Cell Phone:  [phone] Please utilize secure chat with additional questions, if there is no response within 30 minutes please call the above phone number  Palliative Medicine Team providers are available by phone from 7am to 7pm daily and can be reached through the team cell phone.  Should this patient require assistance outside of these hours, please call the patient's attending physician.

## 2022-02-17 NOTE — Progress Notes (Signed)
02/17/22 1200  Mobility  Activity Transferred from bed to chair  Level of Assistance Independent after set-up  Assistive Device Front wheel walker  Distance Ambulated (ft) 5 ft  Activity Response Tolerated well  Mobility Referral Yes  $Mobility charge 1 Mobility   Mobility Specialist Progress Note  Pt was in bed and agreeable. Had no c/o pain throughout ambulation. Left in chair w/ all needs met and call bell in reach.     Mobility Specialist  Please contact via SecureChat or Rehab office at [phone]  

## 2022-02-18 DIAGNOSIS — Z7189 Other specified counseling: Secondary | ICD-10-CM | POA: Diagnosis not present

## 2022-02-18 DIAGNOSIS — Z515 Encounter for palliative care: Secondary | ICD-10-CM | POA: Diagnosis not present

## 2022-02-18 LAB — GLUCOSE, CAPILLARY
Glucose-Capillary: 146 mg/dL — ABNORMAL HIGH (ref 70–99)
Glucose-Capillary: 234 mg/dL — ABNORMAL HIGH (ref 70–99)
Glucose-Capillary: 277 mg/dL — ABNORMAL HIGH (ref 70–99)
Glucose-Capillary: 323 mg/dL — ABNORMAL HIGH (ref 70–99)

## 2022-02-18 LAB — BASIC METABOLIC PANEL
Anion gap: 11 (ref 5–15)
BUN: 84 mg/dL — ABNORMAL HIGH (ref 8–23)
CO2: 19 mmol/L — ABNORMAL LOW (ref 22–32)
Calcium: 10.4 mg/dL — ABNORMAL HIGH (ref 8.9–10.3)
Chloride: 108 mmol/L (ref 98–111)
Creatinine, Ser: 3.2 mg/dL — ABNORMAL HIGH (ref 0.44–1.00)
GFR, Estimated: 13 mL/min — ABNORMAL LOW (ref >60)
Glucose, Bld: 195 mg/dL — ABNORMAL HIGH (ref 70–99)
Potassium: 4.3 mmol/L (ref 3.5–5.1)
Sodium: 138 mmol/L (ref 135–145)

## 2022-02-18 MED ORDER — HYDROCOD POLI-CHLORPHE POLI ER 10-8 MG/5ML PO SUER
5.0000 mL | Freq: Two times a day (BID) | ORAL | Status: DC | PRN
  Administered 2022-02-18 – 2022-02-19 (×2): 5 mL via ORAL
  Filled 2022-02-18 (×2): qty 5

## 2022-02-18 NOTE — Progress Notes (Signed)
PROGRESS NOTE    [name] [name]  ACZ:[id number] DOB: September 14, 1929 DOA: 02/12/2022 PCP: Ginger Organ., MD  89/F with history of chronic diastolic CHF, COPD/asthma, CKD 4, chronic DVT, dementia, hypertension, presented to the ED with cough and dyspnea.  History of fever and cough PTA, prescribed steroids and Augmentin by PCP without improvement. -In the ED labs noted creatinine of 2.1, BNP 1045, WBC 3.6, COVID-19, flu PCR was negative, chest x-ray noted mild cardiomegaly, no pulmonary vascular congestion or infiltrates -Creatinine worsened, diuretics held -Palliative care consulted  Subjective: Feels better overall, breathing is improving  Assessment and Plan:  COPD with acute exacerbation  -History of productive cough, dyspnea, fever  -Treated with IV steroids, bronchodilators, azithromycin  -Taper prednisone -Continue flutter valve, incentive spirometry  -Increase activity, ambulate -Weaned off O2  Acute on chronic diastolic CHF .  Echo with EF 70 to 10%, RV systolic function preserved. RVSP 41.6.  -Continue carvedilol and isosorbide. -Appears euvolemic, hold diuretics 1 more day with AKI  AKI on CKD 3b -Baseline creatinine around 1.5-2, creatinine higher than baseline now -See above holding diuretics, avoid hypotension -Suspect dynamically mediated with blood pressure fluctuations, titrated back antihypertensives yesterday, renal ultrasound negative for hydronephrosis  -Discussed with patient and daughter, she was previously followed by [name] with [facility], has declined hemodialysis in the past, is followed by palliative care as outpatient, -daughter was agreeable and requested palliative care eval inpatient -creatinine continues to improve slowly/plateau  Uncontrolled type -Continue carvedilol, continue current dose of hydralazine, will increase Imdur to 60 Mg -PRN IV hydralazine   Paroxysmal A-fib -Noted this admission, now in sinus rhythm,  continue carvedilol, already on Eliquis for history of DVT  Type 2 diabetes mellitus with hyperlipidemia ([place]) Uncontrolled with hyperglycemia -Increased glargine, on meal coverage  OBESITY Calculated BMI is 33.1 consistent with class 1 obesity.   History of DVT (deep vein thrombosis) Continue anticoagulation with apixaban.   Dementia without behavioral disturbance ([place]) Continue with olanzapine and quetiapine.   DVT prophylaxis: Apixaban Code Status: DNR Family Communication: No family at bedside, called and updated daughter 12/8 Disposition Plan: Home in 1 to 2 days  Consultants:    Procedures:   Antimicrobials:    Objective: Vitals:   02/17/22 2350 02/18/22 0406 02/18/22 0719 02/18/22 0938  BP: (!) 148/56 (!) 171/52 (!) 183/76 (!) 143/60  Pulse: 78 80 80   Resp: 19 20 19    Temp: 98.2 F (36.8 C) 97.7 F (36.5 C) (!) 97.5 F (36.4 C)   TempSrc: Oral Oral Oral   SpO2: 96%  95%   Weight: 86 kg     Height:        Intake/Output Summary (Last 24 hours) at 02/18/2022 1025 Last data filed at 02/18/2022 0800 Gross per 24 hour  Intake 850 ml  Output 202 ml  Net 648 ml   Filed Weights   02/16/22 0536 02/17/22 0411 02/17/22 2350  Weight: 85.5 kg 86.6 kg 86 kg    Examination:  General exam: Obese pleasant female sitting up in bed, AAOx3, no distress HEENT: Neck obese unable to assess JVD CVS: S1-S2, regular rhythm Lungs: Poor air movement bilaterally Abdomen: Soft, nontender, bowel sounds present Extremities: No edema  Skin: No rashes on exposed skin Psychiatry:  Mood & affect appropriate.     Data Reviewed:   CBC: Recent Labs  Lab 02/12/22 1115 02/12/22 1516 02/15/22 0059  WBC 3.6*  --  5.0  NEUTROABS 2.3  --   --  HGB 10.2* 10.2* 11.0*  HCT 32.3* 30.0* 33.8*  MCV 97.6  --  94.2  PLT 128*  --  710   Basic Metabolic Panel: Recent Labs  Lab 02/14/22 0051 02/15/22 0059 02/16/22 0100 02/17/22 0039 02/18/22 0038  NA 134* 137 134* 136 138   K 4.0 4.5 4.7 4.4 4.3  CL 107 107 105 105 108  CO2 18* 18* 20* 21* 19*  GLUCOSE 285* 301* 294* 258* 195*  BUN 47* 52* 74* 79* 84*  CREATININE 2.32* 2.66* 3.70* 3.33* 3.20*  CALCIUM 10.3 10.4* 10.3 10.4* 10.4*   GFR: Estimated Creatinine Clearance: 12.4 mL/min (A) (by C-G formula based on SCr of 3.2 mg/dL (H)). Liver Function Tests: No results for input(s): "AST", "ALT", "ALKPHOS", "BILITOT", "PROT", "ALBUMIN" in the last 168 hours. No results for input(s): "LIPASE", "AMYLASE" in the last 168 hours. No results for input(s): "AMMONIA" in the last 168 hours. Coagulation Profile: No results for input(s): "INR", "PROTIME" in the last 168 hours. Cardiac Enzymes: No results for input(s): "CKTOTAL", "CKMB", "CKMBINDEX", "TROPONINI" in the last 168 hours. BNP (last 3 results) No results for input(s): "PROBNP" in the last 8760 hours. HbA1C: No results for input(s): "HGBA1C" in the last 72 hours.  CBG: Recent Labs  Lab 02/17/22 0609 02/17/22 1129 02/17/22 1618 02/17/22 2143 02/18/22 0603  GLUCAP 232* 191* 165* 200* 146*   Lipid Profile: No results for input(s): "CHOL", "HDL", "LDLCALC", "TRIG", "CHOLHDL", "LDLDIRECT" in the last 72 hours. Thyroid Function Tests: No results for input(s): "TSH", "T4TOTAL", "FREET4", "T3FREE", "THYROIDAB" in the last 72 hours. Anemia Panel: No results for input(s): "VITAMINB12", "FOLATE", "FERRITIN", "TIBC", "IRON", "RETICCTPCT" in the last 72 hours.  Urine analysis:    Component Value Date/Time   COLORURINE YELLOW 02/12/2022 1443   APPEARANCEUR CLEAR 02/12/2022 1443   LABSPEC 1.011 02/12/2022 1443   PHURINE 7.0 02/12/2022 1443   GLUCOSEU NEGATIVE 02/12/2022 1443   HGBUR NEGATIVE 02/12/2022 1443   BILIRUBINUR NEGATIVE 02/12/2022 1443   KETONESUR NEGATIVE 02/12/2022 1443   PROTEINUR 100 (A) 02/12/2022 1443   UROBILINOGEN 1.0 07/25/2009 1045   NITRITE NEGATIVE 02/12/2022 1443   LEUKOCYTESUR TRACE (A) 02/12/2022 1443   Sepsis  Labs: @LABRCNTIP (procalcitonin:4,lacticidven:4)  ) Recent Results (from the past 240 hour(s))  Resp Panel by RT-PCR (Flu A&B, Covid) Anterior Nasal Swab     Status: None   Collection Time: 02/12/22 10:24 AM   Specimen: Anterior Nasal Swab  Result Value Ref Range Status   SARS Coronavirus 2 by RT PCR NEGATIVE NEGATIVE Final    Comment: (NOTE) SARS-CoV-2 target nucleic acids are NOT DETECTED.  The SARS-CoV-2 RNA is generally detectable in upper respiratory specimens during the acute phase of infection. The lowest concentration of SARS-CoV-2 viral copies this assay can detect is 138 copies/mL. A negative result does not preclude SARS-Cov-2 infection and should not be used as the sole basis for treatment or other patient management decisions. A negative result may occur with  improper specimen collection/handling, submission of specimen other than nasopharyngeal swab, presence of viral mutation(s) within the areas targeted by this assay, and inadequate number of viral copies(<138 copies/mL). A negative result must be combined with clinical observations, patient history, and epidemiological information. The expected result is Negative.  Fact Sheet for Patients:  EntrepreneurPulse.com.au  Fact Sheet for Healthcare Providers:  IncredibleEmployment.be  This test is no t yet approved or cleared by the [place] FDA and  has been authorized for detection and/or diagnosis of SARS-CoV-2 by FDA under an Emergency Use Authorization (  EUA). This EUA will remain  in effect (meaning this test can be used) for the duration of the COVID-19 declaration under Section 564(b)(1) of the Act, 21 U.S.C.section 360bbb-3(b)(1), unless the authorization is terminated  or revoked sooner.       Influenza A by PCR NEGATIVE NEGATIVE Final   Influenza B by PCR NEGATIVE NEGATIVE Final    Comment: (NOTE) The Xpert Xpress SARS-CoV-2/FLU/RSV plus assay is intended as an  aid in the diagnosis of influenza from Nasopharyngeal swab specimens and should not be used as a sole basis for treatment. Nasal washings and aspirates are unacceptable for Xpert Xpress SARS-CoV-2/FLU/RSV testing.  Fact Sheet for Patients: EntrepreneurPulse.com.au  Fact Sheet for Healthcare Providers: IncredibleEmployment.be  This test is not yet approved or cleared by the [place] FDA and has been authorized for detection and/or diagnosis of SARS-CoV-2 by FDA under an Emergency Use Authorization (EUA). This EUA will remain in effect (meaning this test can be used) for the duration of the COVID-19 declaration under Section 564(b)(1) of the Act, 21 U.S.C. section 360bbb-3(b)(1), unless the authorization is terminated or revoked.  Performed at [facility], [place] [address], [place], [place] [address]      Radiology Studies: No results found.   Scheduled Meds:  acetaminophen  500 mg Oral BID   apixaban  2.5 mg Oral BID   benzonatate  100 mg Oral TID   carvedilol  3.125 mg Oral BID WC   feeding supplement (GLUCERNA SHAKE)  237 mL Oral TID BM   hydrALAZINE  50 mg Oral TID   insulin aspart  0-15 Units Subcutaneous TID WC   insulin aspart  0-5 Units Subcutaneous QHS   insulin aspart  5 Units Subcutaneous TID WC   insulin glargine-yfgn  30 Units Subcutaneous Daily   isosorbide mononitrate  60 mg Oral Daily   mometasone-formoterol  2 puff Inhalation BID   pantoprazole  40 mg Oral Daily   predniSONE  20 mg Oral Q breakfast   QUEtiapine  12.5 mg Oral QHS   senna-docusate  1 tablet Oral BID   simvastatin  40 mg Oral QPM   sodium chloride flush  3 mL Intravenous Q12H   Continuous Infusions:  sodium chloride       LOS: 5 days    Time spent: 40min  Domenic Polite, MD [facility]   02/18/2022, 10:25 AM

## 2022-02-18 NOTE — TOC Transition Note (Addendum)
Transition of Care [facility]) - CM/SW Discharge Note   Patient Details  Name: [name] [name] MRN: [id number] Date of Birth: [birth year]  Transition of Care [facility]) CM/SW Contact:  Carles Collet, RN Phone Number: 02/18/2022, 12:03 PM   Clinical Narrative:     Damaris Schooner w pt's daughter re consult to set up home hospice services.  Daughter corrected that plan is to go home w [facility] services w [facility] and would like referral placed for home palliative services. Discussed providers and she would like HoP. Referral called in to referral line, awaiting call back. Daughter confirmed that pt has all needed DME at home.    Heard back from HoP who will work up for palliative care and contact daughter in next 24-48 hours.    [name] [name],[name] [name] (Daughter) [phone]      Barriers to Discharge: Continued Medical Work up   Patient Goals and CMS Choice Patient states their goals for this hospitalization and ongoing recovery are:: to go home w Home palliative services and home health services CMS Medicare.gov Compare Post Acute Care list provided to:: Other (Comment Required) Choice offered to / list presented to : Adult Children  Discharge Placement                       Discharge Plan and Services   Discharge Planning Services: CM Consult Post Acute Care Choice: Home Health          DME Arranged:  (has hopsital bed, lift chair, shower chair commode walker) DME Agency: NA       HH Arranged: PT, OT   Date HH Agency Contacted: 02/13/22 Time [place]: 1713 Representative spoke with at [place]: [place] Determinants of Health ([place]) Interventions     Readmission Risk Interventions     No data to display

## 2022-02-18 NOTE — Progress Notes (Signed)
Palliative Medicine Inpatient Follow Up Note HPI: 88 y.o. female  with past medical history of HTN, chronic HFpEF, mild intermittent asthma, OSA on CPAP HS, CKD stage IV, IDDM, DVT on Eliquis, Alzheimer's dementia  admitted on 02/12/2022 with shortness of breath.    Patient was feeling ill for about a week PTA, now admitted for acute on chronic HFpEF, asthma/COPD exacerbation, hypertensive emergency, AKI on CKD3a.  PMT has been consulted to assist with goals of care conversation.  Today's Discussion 02/18/2022  *Please note that this is a verbal dictation therefore any spelling or grammatical errors are due to the "[place] One" system interpretation.  Chart reviewed inclusive of vital signs, progress notes, laboratory results, and diagnostic images.   I met this morning with [name] [name]. We were able to review the MOST which was started yesterday and complete this in accordance with the patients wishes.   Cardiopulmonary Resuscitation: Do Not Attempt Resuscitation (DNR/No CPR)  Medical Interventions: Limited Additional Interventions: Use medical treatment, IV fluids and cardiac monitoring as indicated, DO NOT USE intubation or mechanical ventilation. May consider use of less invasive airway support such as BiPAP or CPAP. Also provide comfort measures. Transfer to the hospital if indicated. Avoid intensive care.   Antibiotics: Determine use of limitation of antibiotics when infection occurs  IV Fluids: IV fluids for a defined trial period  Feeding Tube: Feeding tube for a defined trial period   Patient endorses feeling better each day and vocalizes the hope for discharge at some point in the near future.   We talked for quite some time about her cats and the joy that they bring her. She expressed the sad times she has endured due to the loss of her husband in April. I offered support through therapeutic listening and reflection.   Questions and concerns addressed/Palliative Support  Provided.   Objective Assessment: Vital Signs Vitals:   02/18/22 0719 02/18/22 0938  BP: (!) 183/76 (!) 143/60  Pulse: 80   Resp: 19   Temp: (!) 97.5 F (36.4 C)   SpO2: 95%     Intake/Output Summary (Last 24 hours) at 02/18/2022 1009 Last data filed at 02/18/2022 0800 Gross per 24 hour  Intake 850 ml  Output 202 ml  Net 648 ml    Last Weight  Most recent update: 02/17/2022 11:59 PM    Weight  86 kg (189 lb 9.5 oz)            Gen: Elderly Caucasian female in no acute distress HEENT: moist mucous membranes CV: Regular rate and rhythm  PULM: On room air breathing is even and nonlabored ABD: soft/nontender  EXT: Mild bilateral lower extremity edema  Neuro: Alert and oriented x2-3  SUMMARY OF RECOMMENDATIONS   DNAR/DNI, no HD  MOST Completed, paper copy placed onto the chart electric copy can be found in [place]  DNR Form Completed, paper copy placed onto the chart electric copy can be found in [place]  Patient is open to outpatient palliative support on discharge --> Have requested a referral to [facility]  PMT will continue to incrementally follow  Billing based on MDM: High ______________________________________________________________________________________ [place] Team Team Cell Phone: [phone] Please utilize secure chat with additional questions, if there is no response within 30 minutes please call the above phone number  Palliative Medicine Team providers are available by phone from 7am to 7pm daily and can be reached through the team cell phone.  Should this patient require  assistance outside of these hours, please call the patient's attending physician.     

## 2022-02-19 ENCOUNTER — Other Ambulatory Visit (HOSPITAL_COMMUNITY): Payer: Self-pay

## 2022-02-19 LAB — GLUCOSE, CAPILLARY
Glucose-Capillary: 180 mg/dL — ABNORMAL HIGH (ref 70–99)
Glucose-Capillary: 144 mg/dL — ABNORMAL HIGH (ref 70–99)

## 2022-02-19 LAB — BASIC METABOLIC PANEL
Anion gap: 6 (ref 5–15)
BUN: 79 mg/dL — ABNORMAL HIGH (ref 8–23)
CO2: 22 mmol/L (ref 22–32)
Calcium: 10 mg/dL (ref 8.9–10.3)
Chloride: 109 mmol/L (ref 98–111)
Creatinine, Ser: 2.81 mg/dL — ABNORMAL HIGH (ref 0.44–1.00)
GFR, Estimated: 15 mL/min — ABNORMAL LOW (ref >60)
Glucose, Bld: 191 mg/dL — ABNORMAL HIGH (ref 70–99)
Potassium: 4.1 mmol/L (ref 3.5–5.1)
Sodium: 137 mmol/L (ref 135–145)

## 2022-02-19 MED ORDER — HYDRALAZINE HCL 50 MG PO TABS
50.0000 mg | ORAL_TABLET | Freq: Three times a day (TID) | ORAL | 0 refills | Status: AC
  Filled 2022-02-19: qty 90, 30d supply, fill #0

## 2022-02-19 MED ORDER — CARVEDILOL 3.125 MG PO TABS
3.1250 mg | ORAL_TABLET | Freq: Two times a day (BID) | ORAL | 0 refills | Status: DC
  Filled 2022-02-19: qty 60, 30d supply, fill #0

## 2022-02-19 MED ORDER — FUROSEMIDE 40 MG PO TABS
40.0000 mg | ORAL_TABLET | Freq: Every day | ORAL | 0 refills | Status: AC
  Filled 2022-02-19: qty 30, 30d supply, fill #0

## 2022-02-19 MED ORDER — ISOSORBIDE MONONITRATE ER 30 MG PO TB24
30.0000 mg | ORAL_TABLET | Freq: Every day | ORAL | 0 refills | Status: AC
  Filled 2022-02-19: qty 30, 30d supply, fill #0

## 2022-02-19 NOTE — Progress Notes (Signed)
    This pt has been referred to our Care Connection program. This is a home-based Palliative care program that is provided by [facility]. We will follow the pt at home after discharge to assist with any chronic/acute symptom management needs. We utilize her PCP as the attending for this program. She will be getting nursing visits 1-3 times a month and SW support in the home with these services. She will also have after hours nursing support as well.    She is eligible for other HH services in home with our program in place as well.  Kaiyan Luczak RN [phone]  

## 2022-02-19 NOTE — Progress Notes (Signed)
This chaplain responded to PMT PA-Josseline's consult for spiritual care and Pt. prayer.  The Pt. is smiling and accepting of the chaplain visit; eager to tell the chaplain about d/c today.  Through reflective listening the chaplain understands the Pt. has strong support from her family and neighbors. The Pt. anticipates time with her cats and a visit from her pastor when she returns home.     The Pt. is optimistically hopeful she can return to the quality of life she maintained before this hospital admission.    The Pt. accepted the chaplain's invitation for prayer together.  Chaplain Sallyanne Kuster [phone]

## 2022-02-19 NOTE — Progress Notes (Signed)
TOC medications with pt and pt daughter.  Daughter at bedside. Discharge information and discharge packet given to pt and daughter.

## 2022-02-19 NOTE — Care Management Important Message (Signed)
Important Message  Patient Details  Name: [name] [name] MRN: [id number] Date of Birth: 1929/09/22   Medicare Important Message Given:  Yes     Shelda Altes 02/19/2022, 8:35 AM

## 2022-02-19 NOTE — TOC Transition Note (Addendum)
Transition of Care [facility]) - CM/SW Discharge Note   Patient Details  Name: [name] [name] MRN: [id number] Date of Birth: 1930-01-09  Transition of Care [facility]) CM/SW Contact:  Zenon Mayo, RN Phone Number: 02/19/2022, 10:08 AM   Clinical Narrative:    Patient is for dc home today, this NCM notified Claiborne Billings with [facility], and Cheri with [facility].  Daughter will transport her home today at 3 pm after her appointments and she will bring her some clothes as well.  NCM asked MD for [facility] orders.  TOC to fill meds.   Final next level of care: [place] Barriers to Discharge: No Barriers Identified   Patient Goals and CMS Choice Patient states their goals for this hospitalization and ongoing recovery are:: return home CMS Medicare.gov Compare Post Acute Care list provided to:: Patient Represenative (must comment) Choice offered to / list presented to : Adult Children  Discharge Placement                       Discharge Plan and Services   Discharge Planning Services: CM Consult Post Acute Care Choice: Home Health          DME Arranged:  (has hopsital bed, lift chair, shower chair commode walker) DME Agency: NA       HH Arranged: PT, OT HH Agency: [place] Date [place]: 02/13/22 Time [place]: 1713 Representative spoke with at [place]: [place] Determinants of Health ([place]) Interventions     Readmission Risk Interventions     No data to display

## 2022-02-19 NOTE — Progress Notes (Signed)
Physical Therapy Treatment Patient Details Name: [name] [name] MRN: [id number] DOB: June 09, 1929 Today's Date: 02/19/2022   History of Present Illness 88 yo female presents to [facility] on 12/4 from home with non-productive cough, HTN, increased LE edema.  Chest x-ray consistent with volume overload, workup for HF exacerbation. PMH includes HTN, chronic HFpEF, mild intermittent asthma, OSA on CPAP HS, CKD stage IV, IDDM, DVT on Eliquis, L hip IMN 01/2021, Alzheimer's dementia.    PT Comments    Pt was seen for progression of gait and noted her ability to maneuver on RW as improved from her initial eval.   Progress her to home as planned with HHPT planned, and will work on greater standing balance and LE strength as tolerated and as pt can demonstrate the improvement to progress.  Pt is motivated to help with her care, and has family there PRN.  Recommendations for follow up therapy are one component of a multi-disciplinary discharge planning process, led by the attending physician.  Recommendations may be updated based on patient status, additional functional criteria and insurance authorization.  Follow Up Recommendations  Home health PT     Assistance Recommended at Discharge Frequent or constant Supervision/Assistance  Patient can return home with the following A little help with walking and/or transfers;A little help with bathing/dressing/bathroom   Equipment Recommendations  None recommended by PT    Recommendations for Other Services       Precautions / Restrictions Precautions Precautions: Fall Precaution Comments: watch HR Restrictions Weight Bearing Restrictions: No     Mobility  Bed Mobility Overal bed mobility: Needs Assistance Bed Mobility: Supine to Sit     Supine to sit: Min guard     General bed mobility comments: pt takes additional time wiht help to cue mainly    Transfers Overall transfer level: Needs assistance Equipment used: Rolling walker (2  wheels) Transfers: Sit to/from Stand Sit to Stand: Min guard           General transfer comment: min guard for safety    Ambulation/Gait Ambulation/Gait assistance: Min guard Gait Distance (Feet): 45 Feet Assistive device: Rolling walker (2 wheels) Gait Pattern/deviations: Step-through pattern Gait velocity: reduced Gait velocity interpretation: <1.31 ft/sec, indicative of household ambulator Pre-gait activities: standing balance and sat/hr check General Gait Details: min guard for safety   Stairs             Wheelchair Mobility    Modified Rankin (Stroke Patients Only)       Balance Overall balance assessment: Needs assistance Sitting-balance support: Feet supported Sitting balance-Leahy Scale: Fair     Standing balance support: Bilateral upper extremity supported, During functional activity Standing balance-Leahy Scale: Poor Standing balance comment: fair on RW                            Cognition Arousal/Alertness: Awake/alert Behavior During Therapy: WFL for tasks assessed/performed Overall Cognitive Status: History of cognitive impairments - at baseline                                 General Comments: pt is concerned about how many days she has been in bed and unable to move, but not sure of length of time        Exercises      General Comments General comments (skin integrity, edema, etc.): Pt is requiring minimal help after initial standing and was  min guard to supervision on walker      Pertinent Vitals/Pain Pain Assessment Pain Assessment: Faces Faces Pain Scale: Hurts a little bit Pain Location: generalized Pain Descriptors / Indicators: Guarding Pain Intervention(s): Monitored during session, Repositioned    Home Living                          Prior Function            PT Goals (current goals can now be found in the care plan section) Acute Rehab PT Goals Patient Stated Goal: to walk and  get home Progress towards PT goals: Progressing toward goals    Frequency    Min 3X/week      PT Plan Current plan remains appropriate    Co-evaluation              AM-PAC PT "6 Clicks" Mobility   Outcome Measure  Help needed turning from your back to your side while in a flat bed without using bedrails?: A Little Help needed moving from lying on your back to sitting on the side of a flat bed without using bedrails?: A Little Help needed moving to and from a bed to a chair (including a wheelchair)?: A Little Help needed standing up from a chair using your arms (e.g., wheelchair or bedside chair)?: A Little Help needed to walk in hospital room?: A Little Help needed climbing 3-5 steps with a railing? : A Lot 6 Click Score: 17    End of Session Equipment Utilized During Treatment: Gait belt Activity Tolerance: Patient tolerated treatment well Patient left: in chair;with call bell/phone within reach;with chair alarm set Nurse Communication: Mobility status PT Visit Diagnosis: Other abnormalities of gait and mobility (R26.89);Muscle weakness (generalized) (M62.81)     Time: 1202-1222 PT Time Calculation (min) (ACUTE ONLY): 20 min  Charges:  $Gait Training: 8-22 mins    Ramond Dial 02/19/2022, 2:52 PM  .rut

## 2022-02-19 NOTE — Progress Notes (Addendum)
Occupational Therapy Treatment Patient Details Name: [name] [name] MRN: [id number] DOB: May 07, 1929 Today's Date: 02/19/2022   History of present illness 88 yo female presents to [facility] on 12/4 from home with non-productive cough, HTN, increased LE edema.  Chest x-ray consistent with volume overload, workup for HF exacerbation. PMH includes HTN, chronic HFpEF, mild intermittent asthma, OSA on CPAP HS, CKD stage IV, IDDM, DVT on Eliquis, L hip IMN 01/2021, Alzheimer's dementia.   OT comments  Pt progressing towards goals this session, needing set up A for standing grooming task, able to stand at sink x5-10 min for ADL. Pt needing min guard for transfers and ambulation in room, min guard for bed mobility. Pt presenting with impairments listed below, will follow acutely. Continue to recommend HHOT at d/c.   Recommendations for follow up therapy are one component of a multi-disciplinary discharge planning process, led by the attending physician.  Recommendations may be updated based on patient status, additional functional criteria and insurance authorization.    Follow Up Recommendations  Home health OT     Assistance Recommended at Discharge Frequent or constant Supervision/Assistance  Patient can return home with the following  A little help with walking and/or transfers;A lot of help with bathing/dressing/bathroom;Assistance with cooking/housework;Assist for transportation;Help with stairs or ramp for entrance;Direct supervision/assist for medications management;Direct supervision/assist for financial management   Equipment Recommendations  None recommended by OT (pt has all needed DME)    Recommendations for Other Services PT consult    Precautions / Restrictions Precautions Precautions: Fall Precaution Comments: watch HR Restrictions Weight Bearing Restrictions: No       Mobility Bed Mobility Overal bed mobility: Needs Assistance Bed Mobility: Sit to Supine       Sit to  supine: Min guard        Transfers Overall transfer level: Needs assistance Equipment used: Rolling walker (2 wheels) Transfers: Sit to/from Stand Sit to Stand: Min guard                 Balance Overall balance assessment: Needs assistance Sitting-balance support: Feet supported Sitting balance-Leahy Scale: Fair     Standing balance support: Bilateral upper extremity supported, During functional activity Standing balance-Leahy Scale: Poor Standing balance comment: fair on RW                           ADL either performed or assessed with clinical judgement   ADL Overall ADL's : Needs assistance/impaired     Grooming: Set up;Standing                   Toilet Transfer: Rolling walker (2 wheels);Min guard;Ambulation;Regular Toilet           Functional mobility during ADLs: Rolling walker (2 wheels);Min guard      Extremity/Trunk Assessment Upper Extremity Assessment Upper Extremity Assessment: Generalized weakness   Lower Extremity Assessment Lower Extremity Assessment: Defer to PT evaluation        Vision   Vision Assessment?: No apparent visual deficits   Perception Perception Perception: Not tested   Praxis Praxis Praxis: Not tested    Cognition Arousal/Alertness: Awake/alert Behavior During Therapy: WFL for tasks assessed/performed Overall Cognitive Status: History of cognitive impairments - at baseline                                          Exercises  Shoulder Instructions       General Comments VSS on RA    Pertinent Vitals/ Pain       Pain Assessment Pain Assessment: No/denies pain Pain Score: 2  Faces Pain Scale: Hurts a little bit Pain Location: generalized Pain Descriptors / Indicators: Guarding Pain Intervention(s): Limited activity within patient's tolerance, Monitored during session, Repositioned  Home Living                                          Prior  Functioning/Environment              Frequency  Min 2X/week        Progress Toward Goals  OT Goals(current goals can now be found in the care plan section)  Progress towards OT goals: Progressing toward goals  Acute Rehab OT Goals Patient Stated Goal: none stated OT Goal Formulation: With patient Time For Goal Achievement: 02/28/22 Potential to Achieve Goals: Good ADL Goals Pt Will Perform Upper Body Dressing: with supervision;sitting Pt Will Perform Lower Body Dressing: with min assist;sit to/from stand;sitting/lateral leans Pt Will Transfer to Toilet: with supervision;ambulating;regular height toilet Additional ADL Goal #1: pt will be able to stand x5 min for functional task in order to improve activity tolerance for ADLs  Plan Discharge plan remains appropriate;Frequency remains appropriate    Co-evaluation                 AM-PAC OT "6 Clicks" Daily Activity     Outcome Measure   Help from another person eating meals?: None Help from another person taking care of personal grooming?: A Little Help from another person toileting, which includes using toliet, bedpan, or urinal?: A Little Help from another person bathing (including washing, rinsing, drying)?: A Lot Help from another person to put on and taking off regular upper body clothing?: A Little Help from another person to put on and taking off regular lower body clothing?: A Lot 6 Click Score: 17    End of Session Equipment Utilized During Treatment: Gait belt;Rolling walker (2 wheels)  OT Visit Diagnosis: Unsteadiness on feet (R26.81);Other abnormalities of gait and mobility (R26.89);Muscle weakness (generalized) (M62.81)   Activity Tolerance Patient tolerated treatment well   Patient Left in chair;with call bell/phone within reach;with chair alarm set   Nurse Communication Mobility status        Time: 6440-3474 OT Time Calculation (min): 22 min  Charges: OT General Charges $OT Visit: 1  Visit OT Treatments $Self Care/Home Management : 8-22 mins  Renaye Rakers, OTD, OTR/L SecureChat Preferred Acute Rehab [phone]   Renaye Rakers [place] 02/19/2022, 3:46 PM

## 2022-03-01 NOTE — Discharge Summary (Signed)
Physician Discharge Summary  [name] [name] NGE:[id number] DOB: 1929-09-10 DOA: 02/12/2022  PCP: Ginger Organ., MD  Admit date: 02/12/2022 Discharge date: 02/19/2022  Time spent: 45 minutes  Recommendations for Outpatient Follow-up:  PCP Dr. Brigitte Pulse in 1 to 2 weeks   Discharge Diagnoses:  Principal Problem:   COPD with acute exacerbation [facility]) Active Problems:   Acute on chronic diastolic CHF (congestive heart failure) ([place])   AKI on CKD 3b  type 2 diabetes mellitus with hyperlipidemia ([place])   Essential hypertension   OBESITY   History of DVT (deep vein thrombosis)   Dementia without behavioral disturbance ([place])   COPD exacerbation ([place])   Discharge Condition: Improved  Diet recommendation: Low-sodium heart healthy  Filed Weights   02/17/22 0411 02/17/22 2350 02/19/22 0624  Weight: 86.6 kg 86 kg 87.4 kg    History of present illness:  88/F with history of chronic diastolic CHF, COPD/asthma, CKD 4, chronic DVT, dementia, hypertension, presented to the ED with cough and dyspnea.  History of fever and cough PTA, prescribed steroids and Augmentin by PCP without improvement. -In the ED labs noted creatinine of 2.1, BNP 1045, WBC 3.6, COVID-19, flu PCR was negative, chest x-ray noted mild cardiomegaly, no pulmonary vascular congestion or infiltrates  Hospital Course:   COPD with acute exacerbation  -History of productive cough, dyspnea, fever, wheezing on exam -Treated with IV steroids, bronchodilators, azithromycin  -Improving, Taper prednisone -Weaned off O2   Acute on chronic diastolic CHF .  Echo with EF 70 to 40%, RV systolic function preserved. RVSP 41.6.  -Continue carvedilol and isosorbide. -Appears euvolemic, diuresed briefly with IV Lasix then further diuretics held, Lasix 40 Mg daily resumed at discharge   AKI on CKD 3b -Baseline creatinine around 1.5-2, creatinine higher than baseline now -Suspect dynamically mediated with blood pressure fluctuations,  titrated back antihypertensives , renal ultrasound negative for hydronephrosis  -Discussed with patient and daughter, she was previously followed by [name] with [facility], has declined hemodialysis in the past, is followed by palliative care as outpatient, -daughter was agreeable and requested palliative care eval, seen by palliative care, no plans for dialysis, plan for outpatient palliative care follow-up -creatinine continues to improve slowly/plateau now, check BMP in 1 week after discharge   Uncontrolled type -Continue carvedilol, continue current dose of hydralazine, increased Imdur to 60 Mg   Paroxysmal A-fib -Noted this admission, now in sinus rhythm, continue carvedilol, already on Eliquis for history of DVT   Type 2 diabetes mellitus with hyperlipidemia ([place]) Uncontrolled with hyperglycemia -Increased glargine, on meal coverage   OBESITY Calculated BMI is 33.1 consistent with class 1 obesity.    History of DVT (deep vein thrombosis) Continue anticoagulation with apixaban.    Dementia without behavioral disturbance ([place]) Continue with olanzapine and quetiapine.      Code Status: DNR   Discharge Exam: Vitals:   02/19/22 0800 02/19/22 1131  BP: (!) 145/49 (!) 122/59  Pulse: 65 68  Resp: 18 20  Temp: 98.6 F (37 C) 97.7 F (36.5 C)  SpO2: 97% 95%   General exam: Obese pleasant female sitting up in bed, AAOx3, no distress HEENT: No JVD CVS: S1-S2, regular rhythm Lungs: Improving air movement, no wheezes today, rare basilar rhonchi Abdomen: Soft, nontender, bowel sounds present Extremities: No edema  Skin: No rashes Psychiatry:  Mood & affect appropriate.   Discharge Instructions   Discharge Instructions     Diet - low sodium heart healthy   Complete by: As  directed    Increase activity slowly   Complete by: As directed       Allergies as of 02/19/2022       Reactions   Aspirin Nausea And Vomiting   Codeine Nausea And Vomiting    Ibuprofen Nausea And Vomiting   Meperidine Hcl    Tape    ADHESIVE TAPE CAUSES BLISTERS   Augmentin [amoxicillin-pot Clavulanate]    GI upset        Medication List     STOP taking these medications    amoxicillin-clavulanate 875-125 MG tablet Commonly known as: AUGMENTIN   cloNIDine 0.1 MG tablet Commonly known as: CATAPRES   metoprolol tartrate 50 MG tablet Commonly known as: LOPRESSOR   telmisartan 80 MG tablet Commonly known as: MICARDIS       TAKE these medications    acetaminophen 500 MG tablet Commonly known as: TYLENOL Take 500 mg by mouth 2 (two) times daily.   albuterol 108 (90 Base) MCG/ACT inhaler Commonly known as: ProAir HFA 2 puffs every 4-6 hours as directed- rescue What changed:  how much to take how to take this when to take this reasons to take this additional instructions   carvedilol 3.125 MG tablet Commonly known as: COREG Take 1 tablet (3.125 mg total) by mouth 2 (two) times daily with a meal.   Eliquis 2.5 MG Tabs tablet Generic drug: apixaban Take 1 tablet (2.5 mg total) by mouth 2 (two) times daily.   furosemide 40 MG tablet Commonly known as: LASIX Take 1 tablet (40 mg total) by mouth daily. What changed:  medication strength how much to take additional instructions   hydrALAZINE 50 MG tablet Commonly known as: APRESOLINE Take 1 tablet (50 mg total) by mouth 3 (three) times daily. What changed:  medication strength how much to take additional instructions   isosorbide mononitrate 30 MG 24 hr tablet Commonly known as: IMDUR Take 1 tablet (30 mg total) by mouth daily.   loratadine 10 MG tablet Commonly known as: CLARITIN Take 10 mg by mouth daily as needed for allergies.   predniSONE 10 MG tablet Commonly known as: DELTASONE Take 20 mg by mouth daily in the afternoon.   simvastatin 40 MG tablet Commonly known as: ZOCOR Take 1 tablet (40 mg total) by mouth every evening.   Toujeo SoloStar 300 UNIT/ML  Solostar Pen Generic drug: insulin glargine (1 Unit Dial) Inject 20 Units into the skin at bedtime. What changed: Another medication with the same name was removed. Continue taking this medication, and follow the directions you see here.       Allergies  Allergen Reactions   Aspirin Nausea And Vomiting   Codeine Nausea And Vomiting   Ibuprofen Nausea And Vomiting   Meperidine Hcl    Tape     ADHESIVE TAPE CAUSES BLISTERS   Augmentin [Amoxicillin-Pot Clavulanate]     GI upset    Follow-up Information     [facility] [place] Follow up.   Specialty: [place] Why: A representative from [facility] will contact you to arrange start date and time for your therapy. Contact information: [address] [place] [address] [phone]         Ginger Organ., MD Follow up.   Specialty: Internal Medicine Why: The office will call patient. Contact information: [address] [place] [address] [phone]                  The results of  significant diagnostics from this hospitalization (including imaging, microbiology, ancillary and laboratory) are listed below for reference.    Significant Diagnostic Studies: DG CHEST PORT 1 VIEW  Result Date: 02/14/2022 CLINICAL DATA:  Dyspnea. EXAM: PORTABLE CHEST 1 VIEW COMPARISON:  02/12/2022 FINDINGS: Slightly low lung volumes with mildly prominent lung markings that appear chronic. No focal airspace disease or pulmonary edema. Multiple surgical clips near the GE junction region. Evidence for dextroscoliosis in the upper thoracic spine. Heart size is normal. Atherosclerotic calcifications at the aortic arch. IMPRESSION: Chronic lung changes without acute findings. Electronically Signed   By: Markus Daft M.D.   On: 02/14/2022 09:49   ECHOCARDIOGRAM COMPLETE  Result Date: 02/13/2022    ECHOCARDIOGRAM REPORT   Patient Name:   [name] [name] Date of Exam: 02/13/2022 Medical Rec #:  [id number]         Height:       64.0 in Accession #:    [id number]       Weight:       192.9 lb Date of Birth:  Jul 22, 1929        BSA:          1.927 m Patient Age:    86 years         BP:           172/68 mmHg Patient Gender: F                HR:           59 bpm. Exam Location:  Inpatient Procedure: 2D Echo, 3D Echo, Color Doppler, Cardiac Doppler and Strain Analysis Indications:    CHF-Acute Diastolic H06.23  History:        Patient has prior history of Echocardiogram examinations, most                 recent 09/10/2017. CHF, Signs/Symptoms:Syncope; Risk                 Factors:Diabetes, Sleep Apnea, Hypertension, Dyslipidemia and                 Non-Smoker.  Sonographer:    Greer Pickerel Referring Phys: Lequita Halt  Sonographer Comments: Image acquisition challenging due to respiratory motion and Image acquisition challenging due to patient body habitus. Global longitudinal strain was attempted. IMPRESSIONS  1. Left ventricular ejection fraction, by estimation, is 70 to 75%. The left ventricle has hyperdynamic function. The left ventricle has no regional wall motion abnormalities. Left ventricular diastolic parameters are consistent with Grade II diastolic dysfunction (pseudonormalization).  2. Right ventricular systolic function is normal. The right ventricular size is normal. There is mildly elevated pulmonary artery systolic pressure. The estimated right ventricular systolic pressure is 76.2 mmHg.  3. The mitral valve is degenerative. No evidence of mitral valve regurgitation. No evidence of mitral stenosis. Moderate mitral annular calcification.  4. The aortic valve is tricuspid. There is mild calcification of the aortic valve. Aortic valve regurgitation is not visualized. Aortic valve sclerosis/calcification is present, without any evidence of aortic stenosis.  5. The inferior vena cava is dilated in size with >50% respiratory variability, suggesting right atrial pressure of 8 mmHg. FINDINGS  Left Ventricle: Left ventricular  ejection fraction, by estimation, is 70 to 75%. The left ventricle has hyperdynamic function. The left ventricle has no regional wall motion abnormalities. Global longitudinal strain performed but not reported based on interpreter judgement due to suboptimal tracking. The left ventricular internal cavity size was normal in size. There  is no left ventricular hypertrophy. Left ventricular diastolic parameters are consistent with Grade II diastolic dysfunction (pseudonormalization). Right Ventricle: The right ventricular size is normal. No increase in right ventricular wall thickness. Right ventricular systolic function is normal. There is mildly elevated pulmonary artery systolic pressure. The tricuspid regurgitant velocity is 2.90  m/s, and with an assumed right atrial pressure of 8 mmHg, the estimated right ventricular systolic pressure is 93.5 mmHg. Left Atrium: Left atrial size was normal in size. Right Atrium: Right atrial size was normal in size. Pericardium: Trivial pericardial effusion is present. Mitral Valve: The mitral valve is degenerative in appearance. Moderate mitral annular calcification. No evidence of mitral valve regurgitation. No evidence of mitral valve stenosis. Tricuspid Valve: The tricuspid valve is grossly normal. Tricuspid valve regurgitation is mild . No evidence of tricuspid stenosis. Aortic Valve: The aortic valve is tricuspid. There is mild calcification of the aortic valve. Aortic valve regurgitation is not visualized. Aortic valve sclerosis/calcification is present, without any evidence of aortic stenosis. Pulmonic Valve: The pulmonic valve was grossly normal. Pulmonic valve regurgitation is not visualized. No evidence of pulmonic stenosis. Aorta: The aortic root and ascending aorta are structurally normal, with no evidence of dilitation. Venous: The inferior vena cava is dilated in size with greater than 50% respiratory variability, suggesting right atrial pressure of 8 mmHg.  IAS/Shunts: The atrial septum is grossly normal.  LEFT VENTRICLE PLAX 2D LVIDd:         4.30 cm   Diastology LVIDs:         3.10 cm   LV e' medial:    8.81 cm/s LV PW:         1.00 cm   LV E/e' medial:  14.0 LV IVS:        0.90 cm   LV e' lateral:   8.70 cm/s LVOT diam:     2.00 cm   LV E/e' lateral: 14.1 LV SV:         110 LV SV Index:   57 LVOT Area:     3.14 cm                           3D Volume EF:                          3D EF:        60 %                          LV EDV:       90 ml                          LV ESV:       36 ml                          LV SV:        55 ml RIGHT VENTRICLE RV S prime:     14.30 cm/s TAPSE (M-mode): 2.9 cm LEFT ATRIUM             Index        RIGHT ATRIUM           Index LA diam:        4.50 cm 2.34 cm/m   RA Area:     20.10 cm LA  Vol [facility]):   32.1 ml 16.66 ml/m  RA Volume:   55.10 ml  28.60 ml/m LA Vol (A4C):   59.9 ml 31.09 ml/m LA Biplane Vol: 46.1 ml 23.93 ml/m  AORTIC VALVE LVOT Vmax:   141.00 cm/s LVOT Vmean:  103.000 cm/s LVOT VTI:    0.349 m  AORTA Ao Root diam: 3.30 cm Ao Asc diam:  3.60 cm MITRAL VALVE                TRICUSPID VALVE MV Area (PHT): 3.03 cm     TR Peak grad:   33.6 mmHg MV Decel Time: 250 msec     TR Vmax:        290.00 cm/s MV E velocity: 123.00 cm/s MV A velocity: 98.50 cm/s   SHUNTS MV E/A ratio:  1.25         Systemic VTI:  0.35 m                             Systemic Diam: 2.00 cm Eleonore Chiquito MD Electronically signed by Eleonore Chiquito MD Signature Date/Time: 02/13/2022/10:44:02 AM    Final    US RENAL  Result Date: 02/12/2022 CLINICAL DATA:  Acute kidney injury. EXAM: RENAL / URINARY TRACT ULTRASOUND COMPLETE COMPARISON:  CT abdomen and pelvis 01/28/2021. Renal ultrasound 11/19/2019. FINDINGS: Right Kidney: Renal measurements: 8.5 x 4.1 x 4.2 cm = volume: 75 mL. Increased parenchymal echogenicity. No mass or hydronephrosis. Left Kidney: Renal measurements: 8.5 x 4.5 x 4.7 cm = volume: 95 mL. Increased parenchymal echogenicity. No  hydronephrosis. 2.4 cm cyst upper pole cyst, not significantly changed from 2021. Bladder: Appears normal for degree of bladder distention. Other: None. IMPRESSION: Small, echogenic kidneys compatible with medical renal disease. No hydronephrosis. Electronically Signed   By: Logan Bores M.D.   On: 02/12/2022 16:09   DG Chest Portable 1 View  Result Date: 02/12/2022 CLINICAL DATA:  Cough, shortness of breath EXAM: PORTABLE CHEST 1 VIEW COMPARISON:  02/18/2021 FINDINGS: Transverse diameter of heart is increased. There are no signs of pulmonary edema or focal pulmonary consolidation. There is no pleural effusion or pneumothorax. Surgical clips are noted at the gastroesophageal junction and epigastrium. IMPRESSION: Cardiomegaly. There are no signs of pulmonary edema or focal pulmonary consolidation. Electronically Signed   By: Elmer Picker M.D.   On: 02/12/2022 11:25    Microbiology: No results found for this or any previous visit (from the past 240 hour(s)).   Labs: Basic Metabolic Panel: No results for input(s): "NA", "K", "CL", "CO2", "GLUCOSE", "BUN", "CREATININE", "CALCIUM", "MG", "PHOS" in the last 168 hours. Liver Function Tests: No results for input(s): "AST", "ALT", "ALKPHOS", "BILITOT", "PROT", "ALBUMIN" in the last 168 hours. No results for input(s): "LIPASE", "AMYLASE" in the last 168 hours. No results for input(s): "AMMONIA" in the last 168 hours. CBC: No results for input(s): "WBC", "NEUTROABS", "HGB", "HCT", "MCV", "PLT" in the last 168 hours. Cardiac Enzymes: No results for input(s): "CKTOTAL", "CKMB", "CKMBINDEX", "TROPONINI" in the last 168 hours. BNP: BNP (last 3 results) Recent Labs    02/12/22 1115  BNP 1,045.2*    ProBNP (last 3 results) No results for input(s): "PROBNP" in the last 8760 hours.  CBG: No results for input(s): "GLUCAP" in the last 168 hours.     Signed:  Domenic Polite MD.  [facility] 03/01/2022, 2:14 PM

## 2022-04-26 ENCOUNTER — Other Ambulatory Visit: Payer: Self-pay

## 2022-04-26 ENCOUNTER — Inpatient Hospital Stay (HOSPITAL_COMMUNITY)
Admission: EM | Admit: 2022-04-26 | Discharge: 2022-04-28 | DRG: 394 | Disposition: A | Discharge status: Home / Self Care | Payer: Medicare Other | Attending: Internal Medicine | Admitting: Internal Medicine

## 2022-04-26 ENCOUNTER — Encounter (HOSPITAL_COMMUNITY): Payer: Self-pay

## 2022-04-26 DIAGNOSIS — K449 Diaphragmatic hernia without obstruction or gangrene: Secondary | ICD-10-CM | POA: Diagnosis present

## 2022-04-26 DIAGNOSIS — Z7952 Long term (current) use of systemic steroids: Secondary | ICD-10-CM

## 2022-04-26 DIAGNOSIS — I129 Hypertensive chronic kidney disease with stage 1 through stage 4 chronic kidney disease, or unspecified chronic kidney disease: Secondary | ICD-10-CM | POA: Diagnosis present

## 2022-04-26 DIAGNOSIS — Z833 Family history of diabetes mellitus: Secondary | ICD-10-CM

## 2022-04-26 DIAGNOSIS — W44F3XA Food entering into or through a natural orifice, initial encounter: Secondary | ICD-10-CM | POA: Diagnosis present

## 2022-04-26 DIAGNOSIS — Z91048 Other nonmedicinal substance allergy status: Secondary | ICD-10-CM

## 2022-04-26 DIAGNOSIS — T18128A Food in esophagus causing other injury, initial encounter: Secondary | ICD-10-CM | POA: Diagnosis not present

## 2022-04-26 DIAGNOSIS — K314 Gastric diverticulum: Secondary | ICD-10-CM | POA: Diagnosis present

## 2022-04-26 DIAGNOSIS — Z6833 Body mass index (BMI) 33.0-33.9, adult: Secondary | ICD-10-CM

## 2022-04-26 DIAGNOSIS — R112 Nausea with vomiting, unspecified: Secondary | ICD-10-CM

## 2022-04-26 DIAGNOSIS — T18108A Unspecified foreign body in esophagus causing other injury, initial encounter: Principal | ICD-10-CM

## 2022-04-26 DIAGNOSIS — K222 Esophageal obstruction: Secondary | ICD-10-CM | POA: Diagnosis present

## 2022-04-26 DIAGNOSIS — R1110 Vomiting, unspecified: Secondary | ICD-10-CM | POA: Diagnosis not present

## 2022-04-26 DIAGNOSIS — Z86718 Personal history of other venous thrombosis and embolism: Secondary | ICD-10-CM

## 2022-04-26 DIAGNOSIS — Z79899 Other long term (current) drug therapy: Secondary | ICD-10-CM

## 2022-04-26 DIAGNOSIS — F0390 Unspecified dementia without behavioral disturbance: Secondary | ICD-10-CM | POA: Diagnosis present

## 2022-04-26 DIAGNOSIS — Z66 Do not resuscitate: Secondary | ICD-10-CM | POA: Diagnosis present

## 2022-04-26 DIAGNOSIS — Z885 Allergy status to narcotic agent status: Secondary | ICD-10-CM

## 2022-04-26 DIAGNOSIS — J4489 Other specified chronic obstructive pulmonary disease: Secondary | ICD-10-CM | POA: Diagnosis present

## 2022-04-26 DIAGNOSIS — Z886 Allergy status to analgesic agent status: Secondary | ICD-10-CM

## 2022-04-26 DIAGNOSIS — E785 Hyperlipidemia, unspecified: Secondary | ICD-10-CM | POA: Diagnosis present

## 2022-04-26 DIAGNOSIS — Z90710 Acquired absence of both cervix and uterus: Secondary | ICD-10-CM

## 2022-04-26 DIAGNOSIS — Z9989 Dependence on other enabling machines and devices: Secondary | ICD-10-CM

## 2022-04-26 DIAGNOSIS — K220 Achalasia of cardia: Secondary | ICD-10-CM | POA: Diagnosis present

## 2022-04-26 DIAGNOSIS — E1169 Type 2 diabetes mellitus with other specified complication: Secondary | ICD-10-CM | POA: Diagnosis present

## 2022-04-26 DIAGNOSIS — E1122 Type 2 diabetes mellitus with diabetic chronic kidney disease: Secondary | ICD-10-CM | POA: Diagnosis present

## 2022-04-26 DIAGNOSIS — N184 Chronic kidney disease, stage 4 (severe): Secondary | ICD-10-CM | POA: Diagnosis present

## 2022-04-26 DIAGNOSIS — G4733 Obstructive sleep apnea (adult) (pediatric): Secondary | ICD-10-CM | POA: Diagnosis present

## 2022-04-26 DIAGNOSIS — Z888 Allergy status to other drugs, medicaments and biological substances status: Secondary | ICD-10-CM

## 2022-04-26 DIAGNOSIS — E669 Obesity, unspecified: Secondary | ICD-10-CM | POA: Diagnosis present

## 2022-04-26 DIAGNOSIS — K219 Gastro-esophageal reflux disease without esophagitis: Secondary | ICD-10-CM | POA: Diagnosis present

## 2022-04-26 DIAGNOSIS — I10 Essential (primary) hypertension: Secondary | ICD-10-CM | POA: Diagnosis present

## 2022-04-26 DIAGNOSIS — Z7901 Long term (current) use of anticoagulants: Secondary | ICD-10-CM

## 2022-04-26 DIAGNOSIS — Z801 Family history of malignant neoplasm of trachea, bronchus and lung: Secondary | ICD-10-CM

## 2022-04-26 DIAGNOSIS — J449 Chronic obstructive pulmonary disease, unspecified: Secondary | ICD-10-CM | POA: Insufficient documentation

## 2022-04-26 DIAGNOSIS — Z794 Long term (current) use of insulin: Secondary | ICD-10-CM

## 2022-04-26 LAB — CBC WITH DIFFERENTIAL/PLATELET
Abs Immature Granulocytes: 0.41 10*3/uL — ABNORMAL HIGH (ref 0.00–0.07)
Basophils Absolute: 0 10*3/uL (ref 0.0–0.1)
Basophils Relative: 0 %
Eosinophils Absolute: 0 10*3/uL (ref 0.0–0.5)
Eosinophils Relative: 1 %
HCT: 42.3 % (ref 36.0–46.0)
Hemoglobin: 13.7 g/dL (ref 12.0–15.0)
Immature Granulocytes: 8 %
Lymphocytes Relative: 31 %
Lymphs Abs: 1.7 10*3/uL (ref 0.7–4.0)
MCH: 31.4 pg (ref 26.0–34.0)
MCHC: 32.4 g/dL (ref 30.0–36.0)
MCV: 97 fL (ref 80.0–100.0)
Monocytes Absolute: 0.4 10*3/uL (ref 0.1–1.0)
Monocytes Relative: 7 %
Neutro Abs: 2.9 10*3/uL (ref 1.7–7.7)
Neutrophils Relative %: 53 %
Platelets: 139 10*3/uL — ABNORMAL LOW (ref 150–400)
RBC: 4.36 MIL/uL (ref 3.87–5.11)
RDW: 13.2 % (ref 11.5–15.5)
WBC: 5.4 10*3/uL (ref 4.0–10.5)
nRBC: 0 % (ref 0.0–0.2)

## 2022-04-26 LAB — GLUCOSE, CAPILLARY: Glucose-Capillary: 113 mg/dL — ABNORMAL HIGH (ref 70–99)

## 2022-04-26 LAB — BASIC METABOLIC PANEL
Anion gap: 12 (ref 5–15)
BUN: 29 mg/dL — ABNORMAL HIGH (ref 8–23)
CO2: 22 mmol/L (ref 22–32)
Calcium: 10.8 mg/dL — ABNORMAL HIGH (ref 8.9–10.3)
Chloride: 108 mmol/L (ref 98–111)
Creatinine, Ser: 1.79 mg/dL — ABNORMAL HIGH (ref 0.44–1.00)
GFR, Estimated: 27 mL/min — ABNORMAL LOW (ref >60)
Glucose, Bld: 144 mg/dL — ABNORMAL HIGH (ref 70–99)
Potassium: 3.9 mmol/L (ref 3.5–5.1)
Sodium: 142 mmol/L (ref 135–145)

## 2022-04-26 MED ORDER — HEPARIN (PORCINE) 25000 UT/250ML-% IV SOLN
1250.0000 [IU]/h | INTRAVENOUS | Status: AC
  Administered 2022-04-26: 1250 [IU]/h via INTRAVENOUS
  Filled 2022-04-26: qty 250

## 2022-04-26 MED ORDER — SODIUM CHLORIDE 0.9 % IV SOLN: INTRAVENOUS | Status: DC

## 2022-04-26 MED ORDER — HYDRALAZINE HCL 20 MG/ML IJ SOLN
10.0000 mg | Freq: Four times a day (QID) | INTRAMUSCULAR | Status: DC | PRN
  Administered 2022-04-27: 10 mg via INTRAVENOUS
  Filled 2022-04-26: qty 1

## 2022-04-26 MED ORDER — ONDANSETRON HCL 4 MG/2ML IJ SOLN: 4.0000 mg | Freq: Four times a day (QID) | INTRAMUSCULAR | Status: DC | PRN

## 2022-04-26 MED ORDER — LACTATED RINGERS IV SOLN: INTRAVENOUS | Status: DC

## 2022-04-26 MED ORDER — INSULIN GLARGINE-YFGN 100 UNIT/ML ~~LOC~~ SOLN
10.0000 [IU] | Freq: Every day | SUBCUTANEOUS | Status: DC
  Administered 2022-04-26 – 2022-04-27 (×2): 10 [IU] via SUBCUTANEOUS
  Filled 2022-04-26 (×3): qty 0.1

## 2022-04-26 MED ORDER — INSULIN GLARGINE (1 UNIT DIAL) 300 UNIT/ML ~~LOC~~ SOPN: 10.0000 [IU] | PEN_INJECTOR | Freq: Every day | SUBCUTANEOUS | Status: DC

## 2022-04-26 MED ORDER — INSULIN ASPART 100 UNIT/ML IJ SOLN: 0.0000 [IU] | INTRAMUSCULAR | Status: DC

## 2022-04-26 MED ORDER — METOPROLOL TARTRATE 5 MG/5ML IV SOLN
5.0000 mg | Freq: Four times a day (QID) | INTRAVENOUS | Status: DC
  Administered 2022-04-26 – 2022-04-27 (×3): 5 mg via INTRAVENOUS
  Filled 2022-04-26 (×3): qty 5

## 2022-04-26 MED ORDER — ONDANSETRON HCL 4 MG PO TABS: 4.0000 mg | ORAL_TABLET | Freq: Four times a day (QID) | ORAL | Status: DC | PRN

## 2022-04-26 MED ORDER — GLUCAGON HCL RDNA (DIAGNOSTIC) 1 MG IJ SOLR
1.0000 mg | Freq: Once | INTRAMUSCULAR | Status: AC
  Administered 2022-04-26: 1 mg via INTRAVENOUS
  Filled 2022-04-26: qty 1

## 2022-04-26 NOTE — H&P (View-Only) (Signed)
[facility] Consultation Note  Referring Provider: [facility] Primary Care Physician:  Ginger Organ., MD Primary Gastroenterologist:  Dr. Therisa Doyne  Reason for Consultation:  nausea, vomiting  HPI: [name] [name] is a 89 y.o. female multiple medical problems (DM, CKD, chronic anticoagulation with eliquis).  Presents nausea and vomiting for over 2 days (Tuesday morning).  Had prior issue November 2022, when she was in hospital after hip fracture, and had 3 days of nausea and vomiting and was found to have esophagus full of food on CT scan, requiring endoscopy.  Endoscopy Dr. Therisa Doyne November 2022 describes esophagus full of food, took several tools/devices to clear esophagus (procedure time one hour).  She is unable to tolerate any food or liquids or her secretions.   Past Medical History:  Diagnosis Date   Allergic rhinitis, cause unspecified    Asthma    CKD (chronic kidney disease) stage 4, GFR 15-29 ml/min (HCC) 01/20/2021   Esophageal reflux    Sleep apnea    on C-pap- Dr young follows   Syncope 2019   Normal LVF, negative monitor   Type II or unspecified type diabetes mellitus without mention of complication, not stated as uncontrolled    Unspecified essential hypertension    mild LVH, normal LVF, grade 2 DD by echo 2019    Past Surgical History:  Procedure Laterality Date   APPENDECTOMY     ESOPHAGOGASTRODUODENOSCOPY (EGD) WITH PROPOFOL N/A 01/29/2021   Procedure: ESOPHAGOGASTRODUODENOSCOPY (EGD) WITH PROPOFOL;  Surgeon: Ronnette Juniper, MD;  Location: [place];  Service: Gastroenterology;  Laterality: N/A;   FIXATION KYPHOPLASTY LUMBAR SPINE     FOREIGN BODY REMOVAL  01/29/2021   Procedure: FOREIGN BODY REMOVAL;  Surgeon: Ronnette Juniper, MD;  Location: [place];  Service: Gastroenterology;;   HIATAL HERNIA REPAIR     INTRAMEDULLARY (IM) NAIL INTERTROCHANTERIC Left 01/21/2021   Procedure: INTRAMEDULLARY (IM) NAIL INTERTROCHANTRIC;  Surgeon: Leandrew Koyanagi, MD;  Location: [place];  Service: Orthopedics;  Laterality: Left;   LUMBAR SPINE SURGERY     TUBAL LIGATION     VAGINAL HYSTERECTOMY      Prior to Admission medications   Medication Sig Start Date End Date Taking? Authorizing Provider  acetaminophen (TYLENOL) 500 MG tablet Take 500 mg by mouth 2 (two) times daily.    [provider]  albuterol (PROAIR HFA) 108 (90 Base) MCG/ACT inhaler 2 puffs every 4-6 hours as directed- rescue Patient taking differently: Inhale 2 puffs into the lungs every 4 (four) hours as needed for wheezing or shortness of breath. 02/17/21   Medina-Vargas, Monina C, NP  carvedilol (COREG) 3.125 MG tablet Take 1 tablet (3.125 mg total) by mouth 2 (two) times daily with a meal. 02/19/22   Domenic Polite, MD  ELIQUIS 2.5 MG TABS tablet Take 1 tablet (2.5 mg total) by mouth 2 (two) times daily. 02/17/21   Medina-Vargas, Monina C, NP  furosemide (LASIX) 40 MG tablet Take 1 tablet (40 mg total) by mouth daily. 02/19/22   Domenic Polite, MD  hydrALAZINE (APRESOLINE) 50 MG tablet Take 1 tablet (50 mg total) by mouth 3 (three) times daily. 02/19/22   Domenic Polite, MD  isosorbide mononitrate (IMDUR) 30 MG 24 hr tablet Take 1 tablet (30 mg total) by mouth daily. 02/19/22   Domenic Polite, MD  loratadine (CLARITIN) 10 MG tablet Take 10 mg by mouth daily as needed for allergies.    [provider]  predniSONE (DELTASONE) 10 MG tablet Take 20 mg by mouth daily  in the afternoon. 02/11/22   [provider]  simvastatin (ZOCOR) 40 MG tablet Take 1 tablet (40 mg total) by mouth every evening. 02/17/21   Medina-Vargas, Monina C, NP  TOUJEO SOLOSTAR 300 UNIT/ML Solostar Pen Inject 20 Units into the skin at bedtime. 01/03/22   [provider]    Current Facility-Administered Medications  Medication Dose Route Frequency Provider Last Rate Last Admin   lactated ringers infusion   Intravenous Continuous Tretha Sciara, MD       Current Outpatient  Medications  Medication Sig Dispense Refill   acetaminophen (TYLENOL) 500 MG tablet Take 500 mg by mouth 2 (two) times daily.     albuterol (PROAIR HFA) 108 (90 Base) MCG/ACT inhaler 2 puffs every 4-6 hours as directed- rescue (Patient taking differently: Inhale 2 puffs into the lungs every 4 (four) hours as needed for wheezing or shortness of breath.) 1 each prn   carvedilol (COREG) 3.125 MG tablet Take 1 tablet (3.125 mg total) by mouth 2 (two) times daily with a meal. 60 tablet 0   ELIQUIS 2.5 MG TABS tablet Take 1 tablet (2.5 mg total) by mouth 2 (two) times daily. 60 tablet 0   furosemide (LASIX) 40 MG tablet Take 1 tablet (40 mg total) by mouth daily. 30 tablet 0   hydrALAZINE (APRESOLINE) 50 MG tablet Take 1 tablet (50 mg total) by mouth 3 (three) times daily. 90 tablet 0   isosorbide mononitrate (IMDUR) 30 MG 24 hr tablet Take 1 tablet (30 mg total) by mouth daily. 30 tablet 0   loratadine (CLARITIN) 10 MG tablet Take 10 mg by mouth daily as needed for allergies.     predniSONE (DELTASONE) 10 MG tablet Take 20 mg by mouth daily in the afternoon.     simvastatin (ZOCOR) 40 MG tablet Take 1 tablet (40 mg total) by mouth every evening. 30 tablet 0   TOUJEO SOLOSTAR 300 UNIT/ML Solostar Pen Inject 20 Units into the skin at bedtime.      Allergies as of 04/26/2022 - Review Complete 04/26/2022  Allergen Reaction Noted   Aspirin Nausea And Vomiting 06/27/2010   Codeine Nausea And Vomiting    Ibuprofen Nausea And Vomiting    Meperidine hcl     Tape  01/20/2021   Augmentin [amoxicillin-pot clavulanate]  08/04/2013    Family History  Problem Relation Age of Onset   Lung cancer Brother    Diabetes Mother     Social History   Socioeconomic History   Marital status: Married    Spouse name: Not on file   Number of children: 4   Years of education: Not on file   Highest education level: 10th grade  Occupational History    Comment: [employer]  Tobacco Use   Smoking status: Never    Smokeless tobacco: Never  Vaping Use   Vaping Use: Never used  Substance and Sexual Activity   Alcohol use: Never   Drug use: Never   Sexual activity: Not Currently  Other Topics Concern   Not on file  Social History Narrative   06/29/20 lives with husband who had 2 strokes, caregiver 3 hours every day of week in mornings   Social Determinants of Health   Financial Resource Strain: Not on file  Food Insecurity: No Food Insecurity (02/13/2022)   Hunger Vital Sign    Worried About Running Out of Food in the Last Year: Never true    Ran Out of Food in the Last Year: Never true  Transportation  Needs: No Transportation Needs (02/13/2022)   PRAPARE - Hydrologist (Medical): No    Lack of Transportation (Non-Medical): No  Physical Activity: Not on file  Stress: Not on file  Social Connections: Not on file  Intimate Partner Violence: Not At Risk (02/13/2022)   Humiliation, Afraid, Rape, and Kick questionnaire    Fear of Current or Ex-Partner: No    Emotionally Abused: No    Physically Abused: No    Sexually Abused: No    Review of Systems: As per HPI, all others negative  Physical Exam: Vital signs in last 24 hours: Temp:  [97.5 F (36.4 C)] 97.5 F (36.4 C) (02/15 1337) Pulse Rate:  [58-87] 78 (02/15 1534) Resp:  [18] 18 (02/15 1534) BP: (152-193)/(85-92) 152/88 (02/15 1534) SpO2:  [96 %-99 %] 97 % (02/15 1534) Weight:  [87.4 kg] 87.4 kg (02/15 1337)   General:   Alert,  Well-developed, well-nourished, pleasant and cooperative in NAD Head:  Normocephalic and atraumatic. Eyes:  Sclera clear, no icterus.   Conjunctiva pink. Ears:  Normal auditory acuity. Nose:  No deformity, discharge,  or lesions. Mouth:  No deformity or lesions.  Oropharynx pink & moist. Neck:  Supple; no masses or thyromegaly. Lungs:  No acute distress Abdomen:  Soft, nontender and nondistended. No masses, hepatosplenomegaly or hernias noted. Normal bowel sounds, without  guarding, and without rebound.     Msk:  Symmetrical without gross deformities. Normal posture. Pulses:  Normal pulses noted. Extremities:  Without clubbing or edema. Neurologic:  Alert and  oriented x4;  grossly normal neurologically. Skin:  Intact without significant lesions or rashes. Psych:  Alert and cooperative. Normal mood and affect.   Lab Results: Recent Labs    04/26/22 1442  WBC 5.4  HGB 13.7  HCT 42.3  PLT 139*   BMET Recent Labs    04/26/22 1442  NA 142  K 3.9  CL 108  CO2 22  GLUCOSE 144*  BUN 29*  CREATININE 1.79*  CALCIUM 10.8*   LFT No results for input(s): "PROT", "ALBUMIN", "AST", "ALT", "ALKPHOS", "BILITOT", "BILIDIR", "IBILI" in the last 72 hours. PT/INR No results for input(s): "LABPROT", "INR" in the last 72 hours.  Studies/Results: No results found.  Impression:   Achalasia with what sounds like food impaction for the past 2.5 days.  Similar issue in November 2022.  Prior history of esophageal dilatations in the remote past. Chronic anticoagulation, Eliquis. Multiple medical problems.  Plan:   Patient is 89 years old and will need what is anticipated to be a lengthy procedure under general anesthesia.  She has not been able to eat or drink for 2.5 days.  She has multiple comorbidities. As such, I think patient should be admitted to hospital overnight, hydrated, and medically optimized prior to endoscopic intervention. Blood thinners to be held. Plan for EGD under general anesthesia tomorrow, at earliest slot possible (currently now ~ 1200). Risks (bleeding, infection, bowel perforation that could require surgery, sedation-related changes in cardiopulmonary systems), benefits (identification and possible treatment of source of symptoms, exclusion of certain causes of symptoms), and alternatives (watchful waiting, radiographic imaging studies, empiric medical treatment) of upper endoscopy (EGD) were explained to patient/family in detail and  patient wishes to proceed.  Appreciate Hospitalists' assistance. [facility] will follow.   LOS: 0 days   Martesha Niedermeier M  04/26/2022, 4:08 PM  Cell [phone] If no answer or after 5 PM call [phone]

## 2022-04-26 NOTE — ED Provider Notes (Signed)
[facility] Provider Note   CSN: [id number] Arrival date & time: 04/26/22  1329     History Chief Complaint  Patient presents with   Swallowed Foreign Body    HPI [name] [name] is a 89 y.o. female presenting for chief complaint of vomitus.  She states that she has had countless vomiting episodes and is no longer tolerating any p.o. intake.  States that she is limited memory but follows with [facility] gastroenterology for recurrent esophageal impactions.  She does not remember what she ate on Tuesday but states that since Tuesday around lunchtime she has not been able to keep anything down.  She has immediate emesis of both food and liquid. Patient's daughter arrived later in the encounter and states that the patient has a history of dementia but had her last food bolus in 2022. Daughter notes that she follows with palliative care for dementia Patient's recorded medical, surgical, social, medication list and allergies were reviewed in the Snapshot window as part of the initial history.   Review of Systems   Review of Systems  Constitutional:  Negative for chills and fever.  HENT:  Negative for ear pain and sore throat.   Eyes:  Negative for pain and visual disturbance.  Respiratory:  Negative for cough and shortness of breath.   Cardiovascular:  Negative for chest pain and palpitations.  Gastrointestinal:  Positive for vomiting. Negative for abdominal pain.  Genitourinary:  Negative for dysuria and hematuria.  Musculoskeletal:  Negative for arthralgias and back pain.  Skin:  Negative for color change and rash.  Neurological:  Negative for seizures and syncope.  All other systems reviewed and are negative.   Physical Exam Updated Vital Signs BP (!) 152/88   Pulse 78   Temp (!) 97.5 F (36.4 C) (Oral)   Resp 18   Ht 5' 4"$  (1.626 m)   Wt 87.4 kg   SpO2 97%   BMI 33.07 kg/m  Physical Exam Vitals and nursing note reviewed.   Constitutional:      General: She is not in acute distress.    Appearance: She is well-developed.  HENT:     Head: Normocephalic and atraumatic.  Eyes:     Conjunctiva/sclera: Conjunctivae normal.  Cardiovascular:     Rate and Rhythm: Normal rate and regular rhythm.     Heart sounds: No murmur heard. Pulmonary:     Effort: Pulmonary effort is normal. No respiratory distress.     Breath sounds: Normal breath sounds.  Abdominal:     Palpations: Abdomen is soft.     Tenderness: There is no abdominal tenderness.  Musculoskeletal:        General: No swelling.     Cervical back: Neck supple.  Skin:    General: Skin is warm and dry.     Capillary Refill: Capillary refill takes less than 2 seconds.  Neurological:     Mental Status: She is alert.  Psychiatric:        Mood and Affect: Mood normal.      ED Course/ Medical Decision Making/ A&P    Procedures Procedures   Medications Ordered in ED Medications  lactated ringers infusion ( Intravenous New Bag/Given 04/26/22 1611)  glucagon (human recombinant) (GLUCAGEN) injection 1 mg (1 mg Intravenous Given 04/26/22 1445)    Medical Decision Making:    [name] is a 89 y.o. female who presented to the ED today with chief complaint of poor p.o. intake.  Detailed above.     Additional history discussed with patient's family/caregivers.  Patient's presentation is complicated by their history of dementia, multiple comorbid medical conditions.  Patient placed on continuous vitals and telemetry monitoring while in ED which was reviewed periodically.   Complete initial physical exam performed, notably the patient  was hemodynamically stable in no acute distress.  Trialed p.o intake with immediate vomitus of the fluid..      Reviewed and confirmed nursing documentation for past medical history, family history, social history.    Initial Assessment:   Patient's history present illness and physical exam findings are most  consistent with recurrent esophageal food bolus and resulting p.o. intolerance. Screening labs reveal no focal pathology. Glucagon without symptomatic improvement.  Consulted gastroenterology.  Dr. Paulita Fujita responded stating that the patient should hold her Eliquis tonight and that they will plan for prolonged EGD tomorrow for symptomatic improvement.  Disposition:   Based on the above findings, I believe this patient is stable for admission.    Patient/family educated about specific findings on our evaluation and explained exact reasons for admission.  Patient/family educated about clinical situation and time was allowed to answer questions.   Admission team communicated with and agreed with need for admission. Patient admitted. Patient ready to move at this time.     Emergency Department Medication Summary:   Medications  lactated ringers infusion ( Intravenous New Bag/Given 04/26/22 1611)  glucagon (human recombinant) (GLUCAGEN) injection 1 mg (1 mg Intravenous Given 04/26/22 1445)        Clinical Impression:  1. Foreign body in esophagus, initial encounter      Admit   Final Clinical Impression(s) / ED Diagnoses Final diagnoses:  Foreign body in esophagus, initial encounter    Rx / DC Orders ED Discharge Orders     None         Tretha Sciara, MD 04/26/22 1656

## 2022-04-26 NOTE — H&P (Signed)
History and Physical  [name] [name] [id number] DOB: [birth year] DOA: 04/26/2022   PCP: Ginger Organ., MD   Patient coming from: Home with Daughter   Chief Complaint: Vomiting for the past 48 hours   HPI: [name] [name] is a 89 y.o. female with medical history significant for CKD stage IV, esophageal reflux, insulin-dependent type 2 diabetes, essential hypertension, on Eliquis due to multiple DVT/PE as well as history of esophageal dysmotility requiring EGD November 2022 who is being admitted to the hospital with persistent nausea and vomiting over the last 48 hours, with inability to tolerate solids, liquids or even manage her oral secretions.  She has not been able to keep down any of her medications, including her Eliquis or antihypertensives.  She denies any chest pain, neck pain, fevers, chills, blood in her emesis, or any significant change in her bowel or bladder habits.  ED Course: Patient was evaluated in the emergency department, vital signs reveal elevated blood pressure as high as 193/92.  Patient is afebrile, with normal pulse.  ER provider had her try to drink some water in the ER, she immediately spit it back up.  GI Dr. Paulita Fujita was consulted, plans upper endoscopy with general anesthesia tomorrow.  Review of Systems: Please see HPI for pertinent positives and negatives. A complete 10 system review of systems are otherwise negative.  Past Medical History:  Diagnosis Date   Allergic rhinitis, cause unspecified    Asthma    CKD (chronic kidney disease) stage 4, GFR 15-29 ml/min (HCC) 01/20/2021   Esophageal reflux    Sleep apnea    on C-pap- Dr young follows   Syncope 2019   Normal LVF, negative monitor   Type II or unspecified type diabetes mellitus without mention of complication, not stated as uncontrolled    Unspecified essential hypertension    mild LVH, normal LVF, grade 2 DD by echo 2019   Past Surgical History:  Procedure Laterality Date    APPENDECTOMY     ESOPHAGOGASTRODUODENOSCOPY (EGD) WITH PROPOFOL N/A 01/29/2021   Procedure: ESOPHAGOGASTRODUODENOSCOPY (EGD) WITH PROPOFOL;  Surgeon: Ronnette Juniper, MD;  Location: [place];  Service: Gastroenterology;  Laterality: N/A;   FIXATION KYPHOPLASTY LUMBAR SPINE     FOREIGN BODY REMOVAL  01/29/2021   Procedure: FOREIGN BODY REMOVAL;  Surgeon: Ronnette Juniper, MD;  Location: [place];  Service: Gastroenterology;;   HIATAL HERNIA REPAIR     INTRAMEDULLARY (IM) NAIL INTERTROCHANTERIC Left 01/21/2021   Procedure: INTRAMEDULLARY (IM) NAIL INTERTROCHANTRIC;  Surgeon: Leandrew Koyanagi, MD;  Location: [place];  Service: Orthopedics;  Laterality: Left;   LUMBAR SPINE SURGERY     TUBAL LIGATION     VAGINAL HYSTERECTOMY      Social History:  reports that she has never smoked. She has never used smokeless tobacco. She reports that she does not drink alcohol and does not use drugs.   Allergies  Allergen Reactions   Aspirin Nausea And Vomiting   Codeine Nausea And Vomiting   Ibuprofen Nausea And Vomiting   Meperidine Hcl    Tape     ADHESIVE TAPE CAUSES BLISTERS   Augmentin [Amoxicillin-Pot Clavulanate]     GI upset    Family History  Problem Relation Age of Onset   Lung cancer Brother    Diabetes Mother      Prior to Admission medications   Medication Sig Start Date End Date Taking? Authorizing Provider  acetaminophen (TYLENOL) 500 MG tablet Take 500 mg by mouth 2 (two) times  daily.    [provider]  albuterol (PROAIR HFA) 108 (90 Base) MCG/ACT inhaler 2 puffs every 4-6 hours as directed- rescue Patient taking differently: Inhale 2 puffs into the lungs every 4 (four) hours as needed for wheezing or shortness of breath. 02/17/21   Medina-Vargas, Monina C, NP  carvedilol (COREG) 3.125 MG tablet Take 1 tablet (3.125 mg total) by mouth 2 (two) times daily with a meal. 02/19/22   Domenic Polite, MD  ELIQUIS 2.5 MG TABS tablet Take 1 tablet (2.5 mg total) by mouth 2 (two) times  daily. 02/17/21   Medina-Vargas, Monina C, NP  furosemide (LASIX) 40 MG tablet Take 1 tablet (40 mg total) by mouth daily. 02/19/22   Domenic Polite, MD  hydrALAZINE (APRESOLINE) 50 MG tablet Take 1 tablet (50 mg total) by mouth 3 (three) times daily. 02/19/22   Domenic Polite, MD  isosorbide mononitrate (IMDUR) 30 MG 24 hr tablet Take 1 tablet (30 mg total) by mouth daily. 02/19/22   Domenic Polite, MD  loratadine (CLARITIN) 10 MG tablet Take 10 mg by mouth daily as needed for allergies.    [provider]  predniSONE (DELTASONE) 10 MG tablet Take 20 mg by mouth daily in the afternoon. 02/11/22   [provider]  simvastatin (ZOCOR) 40 MG tablet Take 1 tablet (40 mg total) by mouth every evening. 02/17/21   Medina-Vargas, Monina C, NP  TOUJEO SOLOSTAR 300 UNIT/ML Solostar Pen Inject 20 Units into the skin at bedtime. 01/03/22   [provider]    Physical Exam: BP (!) 152/88   Pulse 78   Temp (!) 97.5 F (36.4 C) (Oral)   Resp 18   Ht 5' 4"$  (1.626 m)   Wt 87.4 kg   SpO2 97%   BMI 33.07 kg/m   General:  Alert, oriented, calm, in no acute distress looks comfortable, appears younger than her stated age, daughter is at the bedside in the ER Eyes: EOMI, clear conjuctivae, white sclerea Neck: supple, no masses, trachea mildline  Cardiovascular: RRR, no murmurs or rubs, no peripheral edema  Respiratory: clear to auscultation bilaterally, no wheezes, no crackles  Abdomen: soft, nontender, nondistended, normal bowel tones heard  Skin: dry, no rashes  Musculoskeletal: no joint effusions, normal range of motion  Psychiatric: appropriate affect, normal speech  Neurologic: extraocular muscles intact, clear speech, moving all extremities with intact sensorium          Labs on Admission:  Basic Metabolic Panel: Recent Labs  Lab 04/26/22 1442  NA 142  K 3.9  CL 108  CO2 22  GLUCOSE 144*  BUN 29*  CREATININE 1.79*  CALCIUM 10.8*   Liver Function Tests: No  results for input(s): "AST", "ALT", "ALKPHOS", "BILITOT", "PROT", "ALBUMIN" in the last 168 hours. No results for input(s): "LIPASE", "AMYLASE" in the last 168 hours. No results for input(s): "AMMONIA" in the last 168 hours. CBC: Recent Labs  Lab 04/26/22 1442  WBC 5.4  NEUTROABS 2.9  HGB 13.7  HCT 42.3  MCV 97.0  PLT 139*   Cardiac Enzymes: No results for input(s): "CKTOTAL", "CKMB", "CKMBINDEX", "TROPONINI" in the last 168 hours.  BNP (last 3 results) Recent Labs    02/12/22 1115  BNP 1,045.2*    ProBNP (last 3 results) No results for input(s): "PROBNP" in the last 8760 hours.  CBG: No results for input(s): "GLUCAP" in the last 168 hours.  Radiological Exams on Admission: No results found.  Assessment/Plan Principal Problem:   Vomiting -likely due to  achalasia, recurrent esophageal stricture, or less likely food bolus. -Patient admission -Strict n.p.o. -IV fluids -Has been seen by Dr. Paulita Fujita of GI, who plans endoscopy 2/16 Active Problems:   CKD (chronic kidney disease) stage 4, GFR 15-29 ml/min ([place]) -renal function is at baseline   Type 2 diabetes mellitus with hyperlipidemia ([place]) -will continue home basal insulin at half the dose as the patient is n.p.o., and supplement with sliding scale insulin   Essential hypertension -pressure currently uncontrolled, likely due to intolerance of p.o. medications for the last 48 hours.  Will schedule IV metoprolol, and add as needed IV hydralazine.   History of DVT (deep vein thrombosis) -patient is on Eliquis chronically due to history of multiple DVTs and PEs.  Will continue to hold Eliquis, given history of multiple VTE, will initiate heparin drip overnight if okay with Dr. Paulita Fujita (secure chat sent).   Dementia without behavioral disturbance (HCC)   COPD (chronic obstructive pulmonary disease) (HCC) -stable on room air without evidence of exacerbation  DVT prophylaxis: SCDs  Code Status: Patient is a DNR, confirmed with  the patient and her daughter at the bedside at the time of admission.  Family Communication: Discussed with the patient and her daughter at the bedside this afternoon in the ER.  All questions were answered to their satisfaction and they are in agreement with the stated plan.  Consults called: GI Dr. Paulita Fujita has seen the patient in the ER.  Admission status: Observation  Time spent: 45 minutes  Takeira Yanes Neva Seat MD [facility] Pager [phone]  If 7PM-7AM, please contact night-coverage [email or web address] Password [facility]  04/26/2022, 4:51 PM

## 2022-04-26 NOTE — ED Triage Notes (Addendum)
From home. Has had an esophageal blockage since Tuesday with no improvement. DNR/DNI. Denies sob/cp. Limited PO intake since Tuesday, has not taken any medications since then. Pt swallowing secretions and talking with no difficulties. Has had a few episodes of vomitting

## 2022-04-26 NOTE — Progress Notes (Addendum)
ANTICOAGULATION CONSULT NOTE - Initial Consult  Pharmacy Consult for IV heparin  Indication: pulmonary embolus and DVT  Allergies  Allergen Reactions   Aspirin Nausea And Vomiting   Codeine Nausea And Vomiting   Ibuprofen Nausea And Vomiting   Meperidine Hcl    Tape     ADHESIVE TAPE CAUSES BLISTERS   Augmentin [Amoxicillin-Pot Clavulanate]     GI upset    Patient Measurements: Height: 5' 4"$  (162.6 cm) Weight: 87.4 kg (192 lb 10.9 oz) IBW/kg (Calculated) : 54.7 Heparin Dosing Weight: 74.1 kg  Vital Signs: Temp: 98 F (36.7 C) (02/15 1833) Temp Source: Oral (02/15 1833) BP: 191/93 (02/15 1833) Pulse Rate: 79 (02/15 1833)  Labs: Recent Labs    04/26/22 1442  HGB 13.7  HCT 42.3  PLT 139*  CREATININE 1.79*    Estimated Creatinine Clearance: 22.4 mL/min (A) (by C-G formula based on SCr of 1.79 mg/dL (H)).   Medical History: Past Medical History:  Diagnosis Date   Allergic rhinitis, cause unspecified    Asthma    CKD (chronic kidney disease) stage 4, GFR 15-29 ml/min (HCC) 01/20/2021   Esophageal reflux    Sleep apnea    on C-pap- Dr young follows   Syncope 2019   Normal LVF, negative monitor   Type II or unspecified type diabetes mellitus without mention of complication, not stated as uncontrolled    Unspecified essential hypertension    mild LVH, normal LVF, grade 2 DD by echo 2019    Medications:  Medications Prior to Admission  Medication Sig Dispense Refill Last Dose   carvedilol (COREG) 6.25 MG tablet Take 6.25 mg by mouth 2 (two) times daily with a meal.   04/26/2022 at 0900   ELIQUIS 2.5 MG TABS tablet Take 1 tablet (2.5 mg total) by mouth 2 (two) times daily. 60 tablet 0 04/26/2022 at 0930   simvastatin (ZOCOR) 40 MG tablet Take 1 tablet (40 mg total) by mouth every evening. (Patient taking differently: Take 40 mg by mouth at bedtime.) 30 tablet 0 04/25/2022 at pm   acetaminophen (TYLENOL) 500 MG tablet Take 1,000 mg by mouth at bedtime.       albuterol (PROAIR HFA) 108 (90 Base) MCG/ACT inhaler 2 puffs every 4-6 hours as directed- rescue (Patient taking differently: Inhale 2 puffs into the lungs every 4 (four) hours as needed for wheezing or shortness of breath.) 1 each prn    carvedilol (COREG) 3.125 MG tablet Take 1 tablet (3.125 mg total) by mouth 2 (two) times daily with a meal. (Patient not taking: Reported on 04/26/2022) 60 tablet 0 Not Taking   furosemide (LASIX) 40 MG tablet Take 1 tablet (40 mg total) by mouth daily. 30 tablet 0    hydrALAZINE (APRESOLINE) 50 MG tablet Take 1 tablet (50 mg total) by mouth 3 (three) times daily. 90 tablet 0    isosorbide mononitrate (IMDUR) 30 MG 24 hr tablet Take 1 tablet (30 mg total) by mouth daily. 30 tablet 0    loratadine (CLARITIN) 10 MG tablet Take 10 mg by mouth daily as needed for allergies.      predniSONE (DELTASONE) 10 MG tablet Take 20 mg by mouth daily in the afternoon.      TOUJEO SOLOSTAR 300 UNIT/ML Solostar Pen Inject 20 Units into the skin at bedtime.       Assessment: Pharmacy is consulted to start heparin drip in 89 yo female with PMH of PE/DVT. Pt on apixaban PTA. Med on hold pending EGD scheduled  for 2/16.   Per med rec, last dose of apixaban was on 2/15 at 0930.   Today, 04/26/22 Hgb 13.7, plt 139 Scr 1,79 mg/dl, CrCl 23 ml/min     Goal of Therapy:  Heparin level 0.3-0.7 units/ml aPTT 66-102 Monitor platelets by anticoagulation protocol: Yes   Plan:  Due to recent apixaban use will avoid bolus  At 2130 start heparin 1250 units/hr  Currently EGD is scheduled for 1100 on 2/16  Per discussion with admitting MD,  ok to hold 6 hours prior to procedure.  Stop heparin drip at 0500 on 2/16.  Since the drip is to stop close to time level is due will hold off on checking level. F/u anticoagulation after procedure.    Royetta Asal, PharmD, BCPS 04/26/2022 9:01 PM

## 2022-04-26 NOTE — Consult Note (Signed)
[facility] Consultation Note  Referring Provider: [facility] Primary Care Physician:  Ginger Organ., MD Primary Gastroenterologist:  Dr. Therisa Doyne  Reason for Consultation:  nausea, vomiting  HPI: [name] [name] is a 89 y.o. female multiple medical problems (DM, CKD, chronic anticoagulation with eliquis).  Presents nausea and vomiting for over 2 days (Tuesday morning).  Had prior issue November 2022, when she was in hospital after hip fracture, and had 3 days of nausea and vomiting and was found to have esophagus full of food on CT scan, requiring endoscopy.  Endoscopy Dr. Therisa Doyne November 2022 describes esophagus full of food, took several tools/devices to clear esophagus (procedure time one hour).  She is unable to tolerate any food or liquids or her secretions.   Past Medical History:  Diagnosis Date   Allergic rhinitis, cause unspecified    Asthma    CKD (chronic kidney disease) stage 4, GFR 15-29 ml/min (HCC) 01/20/2021   Esophageal reflux    Sleep apnea    on C-pap- Dr young follows   Syncope 2019   Normal LVF, negative monitor   Type II or unspecified type diabetes mellitus without mention of complication, not stated as uncontrolled    Unspecified essential hypertension    mild LVH, normal LVF, grade 2 DD by echo 2019    Past Surgical History:  Procedure Laterality Date   APPENDECTOMY     ESOPHAGOGASTRODUODENOSCOPY (EGD) WITH PROPOFOL N/A 01/29/2021   Procedure: ESOPHAGOGASTRODUODENOSCOPY (EGD) WITH PROPOFOL;  Surgeon: Ronnette Juniper, MD;  Location: [facility];  Service: Gastroenterology;  Laterality: N/A;   FIXATION KYPHOPLASTY LUMBAR SPINE     FOREIGN BODY REMOVAL  01/29/2021   Procedure: FOREIGN BODY REMOVAL;  Surgeon: Ronnette Juniper, MD;  Location: [facility];  Service: Gastroenterology;;   HIATAL HERNIA REPAIR     INTRAMEDULLARY (IM) NAIL INTERTROCHANTERIC Left 01/21/2021   Procedure: INTRAMEDULLARY (IM) NAIL INTERTROCHANTRIC;  Surgeon: Leandrew Koyanagi, MD;  Location: [facility];  Service: Orthopedics;  Laterality: Left;   LUMBAR SPINE SURGERY     TUBAL LIGATION     VAGINAL HYSTERECTOMY      Prior to Admission medications   Medication Sig Start Date End Date Taking? Authorizing Provider  acetaminophen (TYLENOL) 500 MG tablet Take 500 mg by mouth 2 (two) times daily.    [provider]  albuterol (PROAIR HFA) 108 (90 Base) MCG/ACT inhaler 2 puffs every 4-6 hours as directed- rescue Patient taking differently: Inhale 2 puffs into the lungs every 4 (four) hours as needed for wheezing or shortness of breath. 02/17/21   Medina-Vargas, Monina C, NP  carvedilol (COREG) 3.125 MG tablet Take 1 tablet (3.125 mg total) by mouth 2 (two) times daily with a meal. 02/19/22   Domenic Polite, MD  ELIQUIS 2.5 MG TABS tablet Take 1 tablet (2.5 mg total) by mouth 2 (two) times daily. 02/17/21   Medina-Vargas, Monina C, NP  furosemide (LASIX) 40 MG tablet Take 1 tablet (40 mg total) by mouth daily. 02/19/22   Domenic Polite, MD  hydrALAZINE (APRESOLINE) 50 MG tablet Take 1 tablet (50 mg total) by mouth 3 (three) times daily. 02/19/22   Domenic Polite, MD  isosorbide mononitrate (IMDUR) 30 MG 24 hr tablet Take 1 tablet (30 mg total) by mouth daily. 02/19/22   Domenic Polite, MD  loratadine (CLARITIN) 10 MG tablet Take 10 mg by mouth daily as needed for allergies.    [provider]  predniSONE (DELTASONE) 10 MG tablet Take 20 mg by mouth daily  in the afternoon. 02/11/22   [provider]  simvastatin (ZOCOR) 40 MG tablet Take 1 tablet (40 mg total) by mouth every evening. 02/17/21   Medina-Vargas, Monina C, NP  TOUJEO SOLOSTAR 300 UNIT/ML Solostar Pen Inject 20 Units into the skin at bedtime. 01/03/22   [provider]    Current Facility-Administered Medications  Medication Dose Route Frequency Provider Last Rate Last Admin   lactated ringers infusion   Intravenous Continuous Tretha Sciara, MD       Current Outpatient  Medications  Medication Sig Dispense Refill   acetaminophen (TYLENOL) 500 MG tablet Take 500 mg by mouth 2 (two) times daily.     albuterol (PROAIR HFA) 108 (90 Base) MCG/ACT inhaler 2 puffs every 4-6 hours as directed- rescue (Patient taking differently: Inhale 2 puffs into the lungs every 4 (four) hours as needed for wheezing or shortness of breath.) 1 each prn   carvedilol (COREG) 3.125 MG tablet Take 1 tablet (3.125 mg total) by mouth 2 (two) times daily with a meal. 60 tablet 0   ELIQUIS 2.5 MG TABS tablet Take 1 tablet (2.5 mg total) by mouth 2 (two) times daily. 60 tablet 0   furosemide (LASIX) 40 MG tablet Take 1 tablet (40 mg total) by mouth daily. 30 tablet 0   hydrALAZINE (APRESOLINE) 50 MG tablet Take 1 tablet (50 mg total) by mouth 3 (three) times daily. 90 tablet 0   isosorbide mononitrate (IMDUR) 30 MG 24 hr tablet Take 1 tablet (30 mg total) by mouth daily. 30 tablet 0   loratadine (CLARITIN) 10 MG tablet Take 10 mg by mouth daily as needed for allergies.     predniSONE (DELTASONE) 10 MG tablet Take 20 mg by mouth daily in the afternoon.     simvastatin (ZOCOR) 40 MG tablet Take 1 tablet (40 mg total) by mouth every evening. 30 tablet 0   TOUJEO SOLOSTAR 300 UNIT/ML Solostar Pen Inject 20 Units into the skin at bedtime.      Allergies as of 04/26/2022 - Review Complete 04/26/2022  Allergen Reaction Noted   Aspirin Nausea And Vomiting 06/27/2010   Codeine Nausea And Vomiting    Ibuprofen Nausea And Vomiting    Meperidine hcl     Tape  01/20/2021   Augmentin [amoxicillin-pot clavulanate]  08/04/2013    Family History  Problem Relation Age of Onset   Lung cancer Brother    Diabetes Mother     Social History   Socioeconomic History   Marital status: Married    Spouse name: Not on file   Number of children: 4   Years of education: Not on file   Highest education level: 10th grade  Occupational History    Comment: [employer]  Tobacco Use   Smoking status: Never    Smokeless tobacco: Never  Vaping Use   Vaping Use: Never used  Substance and Sexual Activity   Alcohol use: Never   Drug use: Never   Sexual activity: Not Currently  Other Topics Concern   Not on file  Social History Narrative   06/29/20 lives with husband who had 2 strokes, caregiver 3 hours every day of week in mornings   Social Determinants of Health   Financial Resource Strain: Not on file  Food Insecurity: No Food Insecurity (02/13/2022)   Hunger Vital Sign    Worried About Running Out of Food in the Last Year: Never true    Ran Out of Food in the Last Year: Never true  Transportation  Needs: No Transportation Needs (02/13/2022)   PRAPARE - Hydrologist (Medical): No    Lack of Transportation (Non-Medical): No  Physical Activity: Not on file  Stress: Not on file  Social Connections: Not on file  Intimate Partner Violence: Not At Risk (02/13/2022)   Humiliation, Afraid, Rape, and Kick questionnaire    Fear of Current or Ex-Partner: No    Emotionally Abused: No    Physically Abused: No    Sexually Abused: No    Review of Systems: As per HPI, all others negative  Physical Exam: Vital signs in last 24 hours: Temp:  [97.5 F (36.4 C)] 97.5 F (36.4 C) (02/15 1337) Pulse Rate:  [58-87] 78 (02/15 1534) Resp:  [18] 18 (02/15 1534) BP: (152-193)/(85-92) 152/88 (02/15 1534) SpO2:  [96 %-99 %] 97 % (02/15 1534) Weight:  [87.4 kg] 87.4 kg (02/15 1337)   General:   Alert,  Well-developed, well-nourished, pleasant and cooperative in NAD Head:  Normocephalic and atraumatic. Eyes:  Sclera clear, no icterus.   Conjunctiva pink. Ears:  Normal auditory acuity. Nose:  No deformity, discharge,  or lesions. Mouth:  No deformity or lesions.  Oropharynx pink & moist. Neck:  Supple; no masses or thyromegaly. Lungs:  No acute distress Abdomen:  Soft, nontender and nondistended. No masses, hepatosplenomegaly or hernias noted. Normal bowel sounds, without  guarding, and without rebound.     Msk:  Symmetrical without gross deformities. Normal posture. Pulses:  Normal pulses noted. Extremities:  Without clubbing or edema. Neurologic:  Alert and  oriented x4;  grossly normal neurologically. Skin:  Intact without significant lesions or rashes. Psych:  Alert and cooperative. Normal mood and affect.   Lab Results: Recent Labs    04/26/22 1442  WBC 5.4  HGB 13.7  HCT 42.3  PLT 139*   BMET Recent Labs    04/26/22 1442  NA 142  K 3.9  CL 108  CO2 22  GLUCOSE 144*  BUN 29*  CREATININE 1.79*  CALCIUM 10.8*   LFT No results for input(s): "PROT", "ALBUMIN", "AST", "ALT", "ALKPHOS", "BILITOT", "BILIDIR", "IBILI" in the last 72 hours. PT/INR No results for input(s): "LABPROT", "INR" in the last 72 hours.  Studies/Results: No results found.  Impression:   Achalasia with what sounds like food impaction for the past 2.5 days.  Similar issue in November 2022.  Prior history of esophageal dilatations in the remote past. Chronic anticoagulation, Eliquis. Multiple medical problems.  Plan:   Patient is 89 years old and will need what is anticipated to be a lengthy procedure under general anesthesia.  She has not been able to eat or drink for 2.5 days.  She has multiple comorbidities. As such, I think patient should be admitted to hospital overnight, hydrated, and medically optimized prior to endoscopic intervention. Blood thinners to be held. Plan for EGD under general anesthesia tomorrow, at earliest slot possible (currently now ~ 1200). Risks (bleeding, infection, bowel perforation that could require surgery, sedation-related changes in cardiopulmonary systems), benefits (identification and possible treatment of source of symptoms, exclusion of certain causes of symptoms), and alternatives (watchful waiting, radiographic imaging studies, empiric medical treatment) of upper endoscopy (EGD) were explained to patient/family in detail and  patient wishes to proceed.  Appreciate Hospitalists' assistance. [facility] will follow.   LOS: 0 days   Asucena Galer M  04/26/2022, 4:08 PM  Cell [phone] If no answer or after 5 PM call [phone]

## 2022-04-27 ENCOUNTER — Encounter (HOSPITAL_COMMUNITY)
Admission: EM | Disposition: A | Discharge status: Home / Self Care | Payer: Self-pay | Source: Home / Self Care | Attending: Internal Medicine

## 2022-04-27 ENCOUNTER — Observation Stay (HOSPITAL_COMMUNITY): Payer: Medicare Other | Admitting: Certified Registered Nurse Anesthetist

## 2022-04-27 ENCOUNTER — Encounter (HOSPITAL_COMMUNITY): Payer: Self-pay | Admitting: Internal Medicine

## 2022-04-27 DIAGNOSIS — E1169 Type 2 diabetes mellitus with other specified complication: Secondary | ICD-10-CM | POA: Diagnosis present

## 2022-04-27 DIAGNOSIS — N184 Chronic kidney disease, stage 4 (severe): Secondary | ICD-10-CM | POA: Diagnosis present

## 2022-04-27 DIAGNOSIS — I130 Hypertensive heart and chronic kidney disease with heart failure and stage 1 through stage 4 chronic kidney disease, or unspecified chronic kidney disease: Secondary | ICD-10-CM | POA: Diagnosis not present

## 2022-04-27 DIAGNOSIS — Z801 Family history of malignant neoplasm of trachea, bronchus and lung: Secondary | ICD-10-CM | POA: Diagnosis not present

## 2022-04-27 DIAGNOSIS — Z7952 Long term (current) use of systemic steroids: Secondary | ICD-10-CM | POA: Diagnosis not present

## 2022-04-27 DIAGNOSIS — G4733 Obstructive sleep apnea (adult) (pediatric): Secondary | ICD-10-CM | POA: Diagnosis present

## 2022-04-27 DIAGNOSIS — K220 Achalasia of cardia: Secondary | ICD-10-CM | POA: Diagnosis present

## 2022-04-27 DIAGNOSIS — J449 Chronic obstructive pulmonary disease, unspecified: Secondary | ICD-10-CM

## 2022-04-27 DIAGNOSIS — I129 Hypertensive chronic kidney disease with stage 1 through stage 4 chronic kidney disease, or unspecified chronic kidney disease: Secondary | ICD-10-CM | POA: Diagnosis present

## 2022-04-27 DIAGNOSIS — Z90710 Acquired absence of both cervix and uterus: Secondary | ICD-10-CM | POA: Diagnosis not present

## 2022-04-27 DIAGNOSIS — E669 Obesity, unspecified: Secondary | ICD-10-CM | POA: Diagnosis present

## 2022-04-27 DIAGNOSIS — R1110 Vomiting, unspecified: Secondary | ICD-10-CM | POA: Diagnosis present

## 2022-04-27 DIAGNOSIS — W44F3XA Food entering into or through a natural orifice, initial encounter: Secondary | ICD-10-CM | POA: Diagnosis present

## 2022-04-27 DIAGNOSIS — Z794 Long term (current) use of insulin: Secondary | ICD-10-CM

## 2022-04-27 DIAGNOSIS — Z66 Do not resuscitate: Secondary | ICD-10-CM | POA: Diagnosis present

## 2022-04-27 DIAGNOSIS — Z833 Family history of diabetes mellitus: Secondary | ICD-10-CM | POA: Diagnosis not present

## 2022-04-27 DIAGNOSIS — K219 Gastro-esophageal reflux disease without esophagitis: Secondary | ICD-10-CM | POA: Diagnosis present

## 2022-04-27 DIAGNOSIS — E1122 Type 2 diabetes mellitus with diabetic chronic kidney disease: Secondary | ICD-10-CM

## 2022-04-27 DIAGNOSIS — K449 Diaphragmatic hernia without obstruction or gangrene: Secondary | ICD-10-CM | POA: Diagnosis present

## 2022-04-27 DIAGNOSIS — T18128A Food in esophagus causing other injury, initial encounter: Secondary | ICD-10-CM | POA: Diagnosis present

## 2022-04-27 DIAGNOSIS — K314 Gastric diverticulum: Secondary | ICD-10-CM | POA: Diagnosis present

## 2022-04-27 DIAGNOSIS — Z79899 Other long term (current) drug therapy: Secondary | ICD-10-CM | POA: Diagnosis not present

## 2022-04-27 DIAGNOSIS — J4489 Other specified chronic obstructive pulmonary disease: Secondary | ICD-10-CM | POA: Diagnosis present

## 2022-04-27 DIAGNOSIS — E785 Hyperlipidemia, unspecified: Secondary | ICD-10-CM | POA: Diagnosis present

## 2022-04-27 DIAGNOSIS — Z6833 Body mass index (BMI) 33.0-33.9, adult: Secondary | ICD-10-CM | POA: Diagnosis not present

## 2022-04-27 DIAGNOSIS — Z7901 Long term (current) use of anticoagulants: Secondary | ICD-10-CM | POA: Diagnosis not present

## 2022-04-27 DIAGNOSIS — Z86718 Personal history of other venous thrombosis and embolism: Secondary | ICD-10-CM | POA: Diagnosis not present

## 2022-04-27 DIAGNOSIS — F0390 Unspecified dementia without behavioral disturbance: Secondary | ICD-10-CM | POA: Diagnosis present

## 2022-04-27 DIAGNOSIS — R112 Nausea with vomiting, unspecified: Secondary | ICD-10-CM | POA: Diagnosis not present

## 2022-04-27 DIAGNOSIS — I5033 Acute on chronic diastolic (congestive) heart failure: Secondary | ICD-10-CM

## 2022-04-27 HISTORY — PX: FOREIGN BODY REMOVAL: SHX962

## 2022-04-27 HISTORY — PX: ESOPHAGOGASTRODUODENOSCOPY (EGD) WITH PROPOFOL: SHX5813

## 2022-04-27 LAB — BASIC METABOLIC PANEL
Anion gap: 9 (ref 5–15)
BUN: 28 mg/dL — ABNORMAL HIGH (ref 8–23)
CO2: 23 mmol/L (ref 22–32)
Calcium: 10.1 mg/dL (ref 8.9–10.3)
Chloride: 110 mmol/L (ref 98–111)
Creatinine, Ser: 1.75 mg/dL — ABNORMAL HIGH (ref 0.44–1.00)
GFR, Estimated: 27 mL/min — ABNORMAL LOW (ref >60)
Glucose, Bld: 83 mg/dL (ref 70–99)
Potassium: 3.5 mmol/L (ref 3.5–5.1)
Sodium: 142 mmol/L (ref 135–145)

## 2022-04-27 LAB — CBC
HCT: 34.7 % — ABNORMAL LOW (ref 36.0–46.0)
Hemoglobin: 11.2 g/dL — ABNORMAL LOW (ref 12.0–15.0)
MCH: 30.9 pg (ref 26.0–34.0)
MCHC: 32.3 g/dL (ref 30.0–36.0)
MCV: 95.9 fL (ref 80.0–100.0)
Platelets: 123 10*3/uL — ABNORMAL LOW (ref 150–400)
RBC: 3.62 MIL/uL — ABNORMAL LOW (ref 3.87–5.11)
RDW: 13.2 % (ref 11.5–15.5)
WBC: 5.6 10*3/uL (ref 4.0–10.5)
nRBC: 0 % (ref 0.0–0.2)

## 2022-04-27 LAB — GLUCOSE, CAPILLARY
Glucose-Capillary: 136 mg/dL — ABNORMAL HIGH (ref 70–99)
Glucose-Capillary: 77 mg/dL (ref 70–99)
Glucose-Capillary: 83 mg/dL (ref 70–99)
Glucose-Capillary: 89 mg/dL (ref 70–99)
Glucose-Capillary: 93 mg/dL (ref 70–99)
Glucose-Capillary: 95 mg/dL (ref 70–99)
Glucose-Capillary: 99 mg/dL (ref 70–99)

## 2022-04-27 SURGERY — ESOPHAGOGASTRODUODENOSCOPY (EGD) WITH PROPOFOL
Anesthesia: General

## 2022-04-27 MED ORDER — PHENYLEPHRINE 80 MCG/ML (10ML) SYRINGE FOR IV PUSH (FOR BLOOD PRESSURE SUPPORT)
PREFILLED_SYRINGE | INTRAVENOUS | Status: DC | PRN
  Administered 2022-04-27: 160 ug via INTRAVENOUS
  Administered 2022-04-27 (×2): 80 ug via INTRAVENOUS
  Administered 2022-04-27: 160 ug via INTRAVENOUS

## 2022-04-27 MED ORDER — NITROGLYCERIN 2 % TD OINT
0.5000 [in_us] | TOPICAL_OINTMENT | Freq: Four times a day (QID) | TRANSDERMAL | Status: DC
  Administered 2022-04-27: 0.5 [in_us] via TOPICAL
  Filled 2022-04-27: qty 30

## 2022-04-27 MED ORDER — SUCCINYLCHOLINE CHLORIDE 200 MG/10ML IV SOSY
PREFILLED_SYRINGE | INTRAVENOUS | Status: DC | PRN
  Administered 2022-04-27: 100 mg via INTRAVENOUS

## 2022-04-27 MED ORDER — HYDRALAZINE HCL 20 MG/ML IJ SOLN
10.0000 mg | Freq: Once | INTRAMUSCULAR | Status: AC
  Administered 2022-04-27: 10 mg via INTRAVENOUS

## 2022-04-27 MED ORDER — FENTANYL CITRATE (PF) 100 MCG/2ML IJ SOLN
INTRAMUSCULAR | Status: DC | PRN
  Administered 2022-04-27: 25 ug via INTRAVENOUS

## 2022-04-27 MED ORDER — LACTATED RINGERS IV SOLN: INTRAVENOUS | Status: DC | PRN

## 2022-04-27 MED ORDER — LIDOCAINE 2% (20 MG/ML) 5 ML SYRINGE
INTRAMUSCULAR | Status: DC | PRN
  Administered 2022-04-27: 60 mg via INTRAVENOUS

## 2022-04-27 MED ORDER — PROPOFOL 500 MG/50ML IV EMUL
INTRAVENOUS | Status: DC | PRN
  Administered 2022-04-27: 125 ug/kg/min via INTRAVENOUS

## 2022-04-27 MED ORDER — PROPOFOL 500 MG/50ML IV EMUL
INTRAVENOUS | Status: AC
  Filled 2022-04-27: qty 100

## 2022-04-27 MED ORDER — HYDRALAZINE HCL 20 MG/ML IJ SOLN: 10.0000 mg | Freq: Four times a day (QID) | INTRAMUSCULAR | Status: DC | PRN

## 2022-04-27 MED ORDER — CARVEDILOL 6.25 MG PO TABS
6.2500 mg | ORAL_TABLET | Freq: Two times a day (BID) | ORAL | Status: DC
  Administered 2022-04-27 – 2022-04-28 (×2): 6.25 mg via ORAL
  Filled 2022-04-27 (×2): qty 1

## 2022-04-27 MED ORDER — INSULIN ASPART 100 UNIT/ML IJ SOLN: 0.0000 [IU] | Freq: Three times a day (TID) | INTRAMUSCULAR | Status: DC

## 2022-04-27 MED ORDER — PROPOFOL 1000 MG/100ML IV EMUL
INTRAVENOUS | Status: AC
  Filled 2022-04-27: qty 100

## 2022-04-27 MED ORDER — ISOSORBIDE MONONITRATE ER 30 MG PO TB24
30.0000 mg | ORAL_TABLET | Freq: Every day | ORAL | Status: DC
  Administered 2022-04-27 – 2022-04-28 (×2): 30 mg via ORAL
  Filled 2022-04-27 (×2): qty 1

## 2022-04-27 MED ORDER — HYDRALAZINE HCL 20 MG/ML IJ SOLN
INTRAMUSCULAR | Status: AC
  Filled 2022-04-27: qty 1

## 2022-04-27 MED ORDER — HEPARIN (PORCINE) 25000 UT/250ML-% IV SOLN
1150.0000 [IU]/h | INTRAVENOUS | Status: DC
  Administered 2022-04-27: 1250 [IU]/h via INTRAVENOUS
  Administered 2022-04-28: 1150 [IU]/h via INTRAVENOUS
  Filled 2022-04-27 (×2): qty 250

## 2022-04-27 MED ORDER — FENTANYL CITRATE (PF) 100 MCG/2ML IJ SOLN
INTRAMUSCULAR | Status: AC
  Filled 2022-04-27: qty 2

## 2022-04-27 MED ORDER — PROPOFOL 500 MG/50ML IV EMUL
INTRAVENOUS | Status: AC
  Filled 2022-04-27: qty 50

## 2022-04-27 MED ORDER — PHENYLEPHRINE HCL-NACL 20-0.9 MG/250ML-% IV SOLN
INTRAVENOUS | Status: DC | PRN
  Administered 2022-04-27: 30 ug/min via INTRAVENOUS

## 2022-04-27 MED ORDER — ONDANSETRON HCL 4 MG/2ML IJ SOLN
INTRAMUSCULAR | Status: DC | PRN
  Administered 2022-04-27: 4 mg via INTRAVENOUS

## 2022-04-27 MED ORDER — PROPOFOL 10 MG/ML IV BOLUS
INTRAVENOUS | Status: DC | PRN
  Administered 2022-04-27: 70 mg via INTRAVENOUS

## 2022-04-27 MED ORDER — CARVEDILOL 3.125 MG PO TABS: 3.1250 mg | ORAL_TABLET | Freq: Two times a day (BID) | ORAL | Status: DC

## 2022-04-27 MED ORDER — HYDRALAZINE HCL 50 MG PO TABS
50.0000 mg | ORAL_TABLET | Freq: Three times a day (TID) | ORAL | Status: DC
  Administered 2022-04-27 – 2022-04-28 (×3): 50 mg via ORAL
  Filled 2022-04-27 (×3): qty 1

## 2022-04-27 SURGICAL SUPPLY — 15 items

## 2022-04-27 NOTE — Interval H&P Note (Signed)
History and Physical Interval Note:  04/27/2022 11:25 AM  [name] [name]  has presented today for surgery, with the diagnosis of esophageal food impaction, achalasia.  The various methods of treatment have been discussed with the patient and family. After consideration of risks, benefits and other options for treatment, the patient has consented to  Procedure(s): ESOPHAGOGASTRODUODENOSCOPY (EGD) WITH PROPOFOL (N/A) as a surgical intervention.  The patient's history has been reviewed, patient examined, no change in status, stable for surgery.  I have reviewed the patient's chart and labs.  Questions were answered to the patient's satisfaction.     Landry Dyke

## 2022-04-27 NOTE — Progress Notes (Signed)
  Transition of Care [facility]) Screening Note   Patient Details  Name: [name] [name] Date of Birth: [birth year]   Transition of [facility]) CM/SW Contact:    Henrietta Dine, RN Phone Number: 04/27/2022, 10:47 AM    Transition of Care Department [facility]) has reviewed patient and no TOC needs have been identified at this time. We will continue to monitor patient advancement through interdisciplinary progression rounds. If new patient transition needs arise, please place a TOC consult.

## 2022-04-27 NOTE — Progress Notes (Signed)
MEWS Progress Note  Patient Details Name: [name] [name] MRN: [id number] DOB: 03/30/1929 Today's Date: 04/27/2022   MEWS Flowsheet Documentation:  Assess: MEWS Score Temp: 98.3 F (36.8 C) BP: (!) 217/73 MAP (mmHg): 112 Pulse Rate: 69 Resp: 18 Level of Consciousness: Alert SpO2: 95 % O2 Device: Room Air Assess: MEWS Score MEWS Temp: 0 MEWS Systolic: 2 MEWS Pulse: 0 MEWS RR: 0 MEWS LOC: 0 MEWS Score: 2 MEWS Score Color: Yellow Assess: SIRS CRITERIA SIRS Temperature : 0 SIRS Respirations : 0 SIRS Pulse: 0 SIRS WBC: 0 SIRS Score Sum : 0 Assess: if the MEWS score is Yellow or Red Were vital signs taken at a resting state?: Yes Focused Assessment: No change from prior assessment Does the patient meet 2 or more of the SIRS criteria?: Yes Does the patient have a confirmed or suspected source of infection?: Yes Provider and Rapid Response Notified?: Yes MEWS guidelines implemented : Yes, yellow Treat MEWS Interventions: Considered administering scheduled or prn medications/treatments as ordered Take Vital Signs Increase Vital Sign Frequency : Yellow: Q2hr x1, continue Q4hrs until patient remains green for 12hrs Escalate MEWS: Escalate: Yellow: Discuss with charge nurse and consider notifying provider and/or RRT Notify: Charge Nurse/RN Name of Charge Nurse/RN Notified: Vaughan Basta Provider Notification Provider Name/Title: Zebedee Iba, NP Date Provider Notified: 04/27/22 Time Provider Notified: [id number] Method of Notification: Page Notification Reason: Other (Comment) (High BP) Provider response: No new orders, Other (Comment) (given hydralazine and will recheck) Date of Provider Response: 04/27/22 Time of Provider Response: 0645 Notify: Rapid Response Name of Rapid Response RN Notified: Timber, RN Date Rapid Response Notified: 04/27/22 Time Rapid Response Notified: [id number]      Claris Pong 04/27/2022, 6:53 AM

## 2022-04-27 NOTE — Progress Notes (Signed)
[facility] Progress Note  Patient: [name] [name]     [id number]  DOA: 04/26/2022   PCP: Ginger Organ., MD       Brief hospital course: Delorse Limber. Haseman, a 89 year old woman with a medical history that includes CKD stage IV, esophageal reflux, insulin-dependent type 2 diabetes, essential hypertension, and a history of multiple DVT/PE episodes necessitating Eliquis treatment, as well as undergoing an EGD in November 2022 for esophageal dysmotility, was admitted to the hospital due to ongoing nausea and vomiting persisting for the past 48 hours. She struggled to tolerate solid foods, liquids and was unable to retain any of her medications, including Eliquis and antihypertensives. In the ER, the provider attempted to have her drink some water, but she immediately regurgitated it. GI Dr. Paulita Fujita was consulted, and plans were made for an upper endoscopy under general anesthesia.  Subjective:  Has nausea.   Assessment and Plan:    Primary Issue: Vomiting stemming from achalasia, and food obstruction. - EGD reveals food in the entire esophagus which was removed - she is advised not to eat more than mechanical soft - start clears today- stop IVF  CKD (chronic kidney disease) stage 4, with a GFR of 15-29 ml/min (HCC) - Renal function remains stable.  Type 2 diabetes mellitus with hyperlipidemia ([place]).  - cont Lantus 10 U and - sliding scale insulin.  Essential hypertension, uncontrolled - unable to take oral meds at home due to vomiting  200/73 today-IV Hydralazine (PRN) given - Nitropaste added until she could resume orals - Q 6 IV Metoprolol switched back to Imdur, Coreg and Hydralzine  History of DVT (deep vein thrombosis) - Patient chronically on Eliquis due to multiple DVTs and PEs. Eliquis remains on hold- heparin started- Gi recommends Eliquis to be started tomorrow  Dementia without behavioral disturbances   COPD (chronic obstructive pulmonary disease)  -  Stable with room air, without exacerbation evidence.    Code Status: DNR Consultants: GI Level of Care: Level of care: Med-Surg Total time on patient care: 35 DVT prophylaxis:  Heparin   Objective:   Vitals:   04/26/22 1833 04/26/22 2043 04/27/22 0205 04/27/22 0633  BP: (!) 191/93 (!) 192/85 (!) 176/63 (!) 217/73  Pulse: 79 78 73 69  Resp: 16 18 18 18  $ Temp: 98 F (36.7 C) 98.3 F (36.8 C) 98.1 F (36.7 C) 98.3 F (36.8 C)  TempSrc: Oral Oral Oral Oral  SpO2: 96% 97% 96% 95%  Weight:      Height:       Filed Weights   04/26/22 1337  Weight: 87.4 kg   Exam: General exam: Appears comfortable  HEENT: oral mucosa moist Respiratory system: Clear to auscultation.  Cardiovascular system: S1 & S2 heard  Gastrointestinal system: Abdomen soft, non-tender, nondistended. Normal bowel sounds   Extremities: No cyanosis, clubbing or edema Psychiatry:  Mood & affect appropriate.      CBC: Recent Labs  Lab 04/26/22 1442 04/27/22 0409  WBC 5.4 5.6  NEUTROABS 2.9  --   HGB 13.7 11.2*  HCT 42.3 34.7*  MCV 97.0 95.9  PLT 139* [id number]   Basic Metabolic Panel: Recent Labs  Lab 04/26/22 1442 04/27/22 0409  NA 142 142  K 3.9 3.5  CL 108 110  CO2 22 23  GLUCOSE 144* 83  BUN 29* 28*  CREATININE 1.79* 1.75*  CALCIUM 10.8* 10.1   GFR: Estimated Creatinine Clearance: 22.9 mL/min (A) (by C-G formula based on SCr of 1.75 mg/dL (H)).  Scheduled Meds:  insulin aspart  0-15 Units Subcutaneous Q4H   insulin glargine-yfgn  10 Units Subcutaneous QHS   metoprolol tartrate  5 mg Intravenous Q6H   Continuous Infusions:  sodium chloride     sodium chloride 100 mL/hr at 04/27/22 0558   Imaging and lab data was personally reviewed No results found.  LOS: 0 days   Author: Debbe Odea  04/27/2022 8:10 AM  To contact [facility]>   Check the care team in [facility] and look for the attending/consulting [place] provider listed  Log into [email or web address] and use [place]'s universal  password   Go to> "[facility]"  and find provider  If you still have difficulty reaching the provider, please page the [facility] (Director on Call) for the Hospitalists listed on amion

## 2022-04-27 NOTE — Op Note (Signed)
[facility] Patient Name: [name] [name] Procedure Date: 04/27/2022 MRN: [id number] Attending MD: Arta Silence , MD, [id number] Date of Birth: March 03, 1930 CSN: [id number] Age: 89 Admit Type: Inpatient Procedure:                Upper GI endoscopy Indications:              Foreign body in the esophagus Providers:                Arta Silence, MD, Carlyn Reichert, RN, Chalese Peach Dalton, Technician, Tyrone Apple, Technician,                            Christell Faith, CRNA Referring MD:             [facility]. Medicines:                General Anesthesia Complications:            No immediate complications. Estimated Blood Loss:     Estimated blood loss: none. Procedure:                Pre-Anesthesia Assessment:                           - Prior to the procedure, a History and Physical                            was performed, and patient medications and                            allergies were reviewed. The patient's tolerance of                            previous anesthesia was also reviewed. The risks                            and benefits of the procedure and the sedation                            options and risks were discussed with the patient.                            All questions were answered, and informed consent                            was obtained. Prior Anticoagulants: The patient has                            taken Eliquis (apixaban), last dose was 1 day prior                            to procedure. ASA Grade Assessment: III - A patient  with severe systemic disease. After reviewing the                            risks and benefits, the patient was deemed in                            satisfactory condition to undergo the procedure.                           After obtaining informed consent, the endoscope was                            passed under direct vision. Throughout the                             procedure, the patient's blood pressure, pulse, and                            oxygen saturations were monitored continuously. The                            [id number] Olympus endoscope was introduced                            through the mouth, and advanced to the antrum of                            the stomach. The upper GI endoscopy was                            accomplished without difficulty. The patient                            tolerated the procedure well. Scope In: Scope Out: Findings:      Food was found in the entire esophagus. Removal was accomplished with a       Raptor grasping device, Roth net and scissors. Procedure took about 1.5       hours to remove all food. No obvious dominant stricture was identified.      A small hiatal hernia was present.      A small non-bleeding diverticulum was found in the cardia.      The exam of the stomach was otherwise normal. Impression:               - Food in the esophagus. Removal was successful.                           - Small hiatal hernia.                           - Gastric diverticulum. Moderate Sedation:      None Recommendation:           - Return patient to hospital ward for ongoing care.                           -  Clear liquid diet today.                           - Ultimately, patient should not consume food                            greater than soft mechanical consistency.                           - Continue present medications.                           [name] will follow. If doing well tomorrow and                            can advance tomorrow to soft diet, then possibly                            home tomorrow. Procedure Code(s):        --- Professional ---                           [phone], 61, Esophagogastroduodenoscopy, flexible,                            transoral; with removal of foreign body(s) Diagnosis Code(s):        --- Professional ---                            [id number], Food in esophagus causing other injury,                            initial encounter                           K44.9, Diaphragmatic hernia without obstruction or                            gangrene                           K31.4, Gastric diverticulum                           T18.108A, Unspecified foreign body in esophagus                            causing other injury, initial encounter CPT copyright 2022 [employer]. All rights reserved. The codes documented in this report are preliminary and upon coder review may  be revised to meet current compliance requirements. Arta Silence, MD 04/27/2022 1:50:52 PM This report has been signed electronically. Number of Addenda: 0

## 2022-04-27 NOTE — Anesthesia Postprocedure Evaluation (Signed)
Anesthesia Post Note  Patient: MAKIYAH HAUVER  Procedure(s) Performed: ESOPHAGOGASTRODUODENOSCOPY (EGD) WITH PROPOFOL FOREIGN BODY REMOVAL     Patient location during evaluation: Endoscopy Anesthesia Type: General Level of consciousness: awake and alert Pain management: pain level controlled Vital Signs Assessment: post-procedure vital signs reviewed and stable Respiratory status: spontaneous breathing and respiratory function stable Cardiovascular status: stable Postop Assessment: no apparent nausea or vomiting Anesthetic complications: no  No notable events documented.  Last Vitals:  Vitals:   04/27/22 1410 04/27/22 1427  BP: (!) 169/51 (!) 160/83  Pulse: 74 (!) 107  Resp: 19 18  Temp:  36.4 C  SpO2: 94% 94%    Last Pain:  Vitals:   04/27/22 1427  TempSrc: Oral  PainSc:                  Alasdair Kleve DANIEL

## 2022-04-27 NOTE — Anesthesia Preprocedure Evaluation (Addendum)
Anesthesia Evaluation  Patient identified by MRN, date of birth, ID band Patient awake    Reviewed: Allergy & Precautions, NPO status , Patient's Chart, lab work & pertinent test results  Airway Mallampati: II  TM Distance: >3 FB Neck ROM: Full    Dental  (+) Upper Dentures, Lower Dentures   Pulmonary asthma , sleep apnea and Continuous Positive Airway Pressure Ventilation , COPD   Pulmonary exam normal        Cardiovascular hypertension, Pt. on medications and Pt. on home beta blockers +CHF   Rhythm:Regular Rate:Normal     Neuro/Psych       Dementia negative neurological ROS     GI/Hepatic Neg liver ROS,GERD  Medicated,,Food impaction   Endo/Other  diabetes, Type 2    Renal/GU CRFRenal disease  negative genitourinary   Musculoskeletal negative musculoskeletal ROS (+)    Abdominal Normal abdominal exam  (+)   Peds  Hematology   Anesthesia Other Findings   Reproductive/Obstetrics                             Anesthesia Physical Anesthesia Plan  ASA: 3  Anesthesia Plan: General   Post-op Pain Management:    Induction: Intravenous and Rapid sequence  PONV Risk Score and Plan: 3 and Treatment may vary due to age or medical condition  Airway Management Planned: Mask and Oral ETT  Additional Equipment: None  Intra-op Plan:   Post-operative Plan: Extubation in OR  Informed Consent: I have reviewed the patients History and Physical, chart, labs and discussed the procedure including the risks, benefits and alternatives for the proposed anesthesia with the patient or authorized representative who has indicated his/her understanding and acceptance.     Dental advisory given  Plan Discussed with: CRNA  Anesthesia Plan Comments: (Lab Results      Component                Value               Date                      WBC                      5.6                 04/27/2022                 HGB                      11.2 (L)            04/27/2022                HCT                      34.7 (L)            04/27/2022                MCV                      95.9                04/27/2022                PLT  123 (L)             04/27/2022           )       Anesthesia Quick Evaluation

## 2022-04-27 NOTE — Anesthesia Procedure Notes (Signed)
Procedure Name: Intubation Date/Time: 04/27/2022 11:43 AM  Performed by: West Pugh, CRNAPre-anesthesia Checklist: Patient identified, Emergency Drugs available, Suction available, Patient being monitored and Timeout performed Patient Re-evaluated:Patient Re-evaluated prior to induction Oxygen Delivery Method: Circle system utilized Preoxygenation: Pre-oxygenation with 100% oxygen Induction Type: IV induction, Rapid sequence and Cricoid Pressure applied Laryngoscope Size: Mac and 3 Grade View: Grade I Tube type: Oral Tube size: 7.0 mm Number of attempts: 1 Airway Equipment and Method: Stylet Placement Confirmation: ETT inserted through vocal cords under direct vision, positive ETCO2, CO2 detector and breath sounds checked- equal and bilateral Secured at: 22 cm Tube secured with: Tape Dental Injury: Teeth and Oropharynx as per pre-operative assessment

## 2022-04-27 NOTE — Transfer of Care (Signed)
Immediate Anesthesia Transfer of Care Note  Patient: [name] [name]  Procedure(s) Performed: ESOPHAGOGASTRODUODENOSCOPY (EGD) WITH PROPOFOL FOREIGN BODY REMOVAL  Patient Location: PACU and Endoscopy Unit  Anesthesia Type:General  Level of Consciousness: awake, alert , and patient cooperative  Airway & Oxygen Therapy: Patient Spontanous Breathing and Patient connected to nasal cannula oxygen  Post-op Assessment: Report given to RN and Post -op Vital signs reviewed and stable  Post vital signs: Reviewed and stable  Last Vitals:  Vitals Value Taken Time  BP 197/64 04/27/22 1352  Temp    Pulse 73 04/27/22 1355  Resp 15 04/27/22 1355  SpO2 100 % 04/27/22 1355  Vitals shown include unvalidated device data.  Last Pain:  Vitals:   04/27/22 1104  TempSrc: Temporal  PainSc: 3          Complications: No notable events documented.

## 2022-04-27 NOTE — Progress Notes (Addendum)
[place] for IV heparin  Indication: pulmonary embolus and DVT  Allergies  Allergen Reactions   Aspirin Nausea And Vomiting   Codeine Nausea And Vomiting   Ibuprofen Nausea And Vomiting   Aricept [Donepezil] Other (See Comments)    Gi upset    Meperidine Hcl Nausea And Vomiting   Namenda [Memantine] Other (See Comments)    Gi upset    Tape Other (See Comments)    ADHESIVE TAPE CAUSES BLISTERS   Augmentin [Amoxicillin-Pot Clavulanate] Other (See Comments)    GI upset    Patient Measurements: Height: 5' 4"$  (162.6 cm) Weight: 87.4 kg (192 lb 10.9 oz) IBW/kg (Calculated) : 54.7 Heparin Dosing Weight: 74.1 kg  Vital Signs: Temp: 97.6 F (36.4 C) (02/16 1427) Temp Source: Oral (02/16 1427) BP: 160/83 (02/16 1427) Pulse Rate: 107 (02/16 1427)  Labs: Recent Labs    04/26/22 1442 04/27/22 0409  HGB 13.7 11.2*  HCT 42.3 34.7*  PLT 139* 123*  CREATININE 1.79* 1.75*     Estimated Creatinine Clearance: 22.9 mL/min (A) (by C-G formula based on SCr of 1.75 mg/dL (H)).   Medical History: Past Medical History:  Diagnosis Date   Allergic rhinitis, cause unspecified    Asthma    CKD (chronic kidney disease) stage 4, GFR 15-29 ml/min (HCC) 01/20/2021   Esophageal reflux    Sleep apnea    on C-pap- Dr young follows   Syncope 2019   Normal LVF, negative monitor   Type II or unspecified type diabetes mellitus without mention of complication, not stated as uncontrolled    Unspecified essential hypertension    mild LVH, normal LVF, grade 2 DD by echo 2019    Medications:  Medications Prior to Admission  Medication Sig Dispense Refill Last Dose   acetaminophen (TYLENOL) 500 MG tablet Take 1,000 mg by mouth in the morning and at bedtime.   04/23/2022   albuterol (PROAIR HFA) 108 (90 Base) MCG/ACT inhaler 2 puffs every 4-6 hours as directed- rescue (Patient taking differently: Inhale 2 puffs into the lungs as needed for wheezing or  shortness of breath.) 1 each prn december   carvedilol (COREG) 6.25 MG tablet Take 6.25 mg by mouth 2 (two) times daily with a meal.   04/26/2022 at 0900   ELIQUIS 2.5 MG TABS tablet Take 1 tablet (2.5 mg total) by mouth 2 (two) times daily. 60 tablet 0 04/26/2022 at 0900   furosemide (LASIX) 40 MG tablet Take 1 tablet (40 mg total) by mouth daily. 30 tablet 0 04/26/2022   hydrALAZINE (APRESOLINE) 50 MG tablet Take 1 tablet (50 mg total) by mouth 3 (three) times daily. (Patient taking differently: Take 50 mg by mouth in the morning and at bedtime.) 90 tablet 0 04/26/2022   isosorbide mononitrate (IMDUR) 30 MG 24 hr tablet Take 1 tablet (30 mg total) by mouth daily. 30 tablet 0 04/26/2022   loratadine (CLARITIN) 10 MG tablet Take 10 mg by mouth daily as needed for allergies.   unk   LYSINE PO Take 1,000 mg by mouth daily.   04/23/2022   omeprazole (PRILOSEC) 20 MG capsule Take 20 mg by mouth daily.   04/26/2022   PRESCRIPTION MEDICATION Inhale 1 Dose into the lungs as needed (wheezing/SOB). Albuterol Nebulizer solution   december   simvastatin (ZOCOR) 40 MG tablet Take 1 tablet (40 mg total) by mouth every evening. (Patient taking differently: Take 40 mg by mouth at bedtime.) 30 tablet 0 04/23/2022   [place]  300 UNIT/ML Solostar Pen Inject 20 Units into the skin at bedtime.   04/23/2022   carvedilol (COREG) 3.125 MG tablet Take 1 tablet (3.125 mg total) by mouth 2 (two) times daily with a meal. (Patient not taking: Reported on 04/26/2022) 60 tablet 0 Not Taking    Assessment: Pharmacy is consulted to start heparin drip in 89 yo female with PMH of PE/DVT. Pt on apixaban PTA. Med on hold for EGD scheduled for 2/16.   Per med rec, last dose of apixaban was on 2/15 at 0930.   Today, 04/27/22 Hgb 11.2 g/dL, plt 123 K/uL Scr 1.75 mg/dl, CrCl 23 ml/min   Goal of Therapy:  Heparin level 0.3-0.7 units/ml aPTT 66-102 Monitor platelets by anticoagulation protocol: Yes   Plan:  -Post EGD, will resume  heparin drip at 1250 units/hr  -Will follow aPTT in setting of PTA apixaban until correlation with anti-Xa. -Check aPTT level in 8 hours and daily while on heparin -Check anti-Xa level in 8 hours  -Daily CBC -Continue to monitor H&H and platelets -Monitor for signs/ symptoms of bleeding.  Mendel Ryder, PharmD Clinical Pharmacist 04/27/2022 2:53 PM

## 2022-04-28 DIAGNOSIS — T18128A Food in esophagus causing other injury, initial encounter: Secondary | ICD-10-CM

## 2022-04-28 DIAGNOSIS — W44F3XA Food entering into or through a natural orifice, initial encounter: Secondary | ICD-10-CM

## 2022-04-28 DIAGNOSIS — K220 Achalasia of cardia: Secondary | ICD-10-CM

## 2022-04-28 LAB — GLUCOSE, CAPILLARY
Glucose-Capillary: 92 mg/dL (ref 70–99)
Glucose-Capillary: 75 mg/dL (ref 70–99)
Glucose-Capillary: 101 mg/dL — ABNORMAL HIGH (ref 70–99)

## 2022-04-28 LAB — CBC
HCT: 34.4 % — ABNORMAL LOW (ref 36.0–46.0)
Hemoglobin: 11.1 g/dL — ABNORMAL LOW (ref 12.0–15.0)
MCH: 31.3 pg (ref 26.0–34.0)
MCHC: 32.3 g/dL (ref 30.0–36.0)
MCV: 96.9 fL (ref 80.0–100.0)
Platelets: 108 10*3/uL — ABNORMAL LOW (ref 150–400)
RBC: 3.55 MIL/uL — ABNORMAL LOW (ref 3.87–5.11)
RDW: 13.1 % (ref 11.5–15.5)
WBC: 5.2 10*3/uL (ref 4.0–10.5)
nRBC: 0 % (ref 0.0–0.2)

## 2022-04-28 LAB — APTT: aPTT: 109 seconds — ABNORMAL HIGH (ref 24–36)

## 2022-04-28 LAB — HEPARIN LEVEL (UNFRACTIONATED): Heparin Unfractionated: 1.03 IU/mL — ABNORMAL HIGH (ref 0.30–0.70)

## 2022-04-28 MED ORDER — SIMVASTATIN 40 MG PO TABS: 40.0000 mg | ORAL_TABLET | Freq: Every evening | ORAL | Status: DC

## 2022-04-28 MED ORDER — APIXABAN 2.5 MG PO TABS
2.5000 mg | ORAL_TABLET | Freq: Two times a day (BID) | ORAL | Status: DC
  Administered 2022-04-28: 2.5 mg via ORAL
  Filled 2022-04-28: qty 1

## 2022-04-28 NOTE — Plan of Care (Signed)
  Problem: Health Behavior/Discharge Planning: Goal: Ability to manage health-related needs will improve 04/28/2022 1533 by Lise Auer, LPN Outcome: Adequate for Discharge 04/28/2022 1015 by Iver Nestle A, LPN Outcome: Progressing   Problem: Clinical Measurements: Goal: Ability to maintain clinical measurements within normal limits will improve Outcome: Adequate for Discharge Goal: Will remain free from infection Outcome: Adequate for Discharge Goal: Diagnostic test results will improve Outcome: Adequate for Discharge Goal: Respiratory complications will improve Outcome: Adequate for Discharge   Problem: Activity: Goal: Risk for activity intolerance will decrease 04/28/2022 1533 by Iver Nestle A, LPN Outcome: Adequate for Discharge 04/28/2022 1015 by Iver Nestle A, LPN Outcome: Progressing   Problem: Nutrition: Goal: Adequate nutrition will be maintained 04/28/2022 1533 by Lise Auer, LPN Outcome: Adequate for Discharge 04/28/2022 1015 by Lise Auer, LPN Outcome: Progressing   Problem: Coping: Goal: Level of anxiety will decrease Outcome: Adequate for Discharge   Problem: Elimination: Goal: Will not experience complications related to bowel motility Outcome: Adequate for Discharge   Problem: Safety: Goal: Ability to remain free from injury will improve Outcome: Adequate for Discharge

## 2022-04-28 NOTE — Progress Notes (Signed)
[facility]astroenterology Progress Note  [name] [name] 89 y.o. 01-13-1930  CC: Achalasia, food impaction   Subjective: No acute issues overnight.  Denies any further nausea and vomiting.  ROS : afebrile, negative for chest pain and shortness of breath   Objective: Vital signs in last 24 hours: Vitals:   04/28/22 0116 04/28/22 0519  BP: (!) 143/42 (!) 147/59  Pulse: 74 76  Resp: 18 16  Temp: 98.1 F (36.7 C) 98.4 F (36.9 C)  SpO2: 95% 96%    Physical Exam:  Elderly patient, resting comfortably.  Not in acute distress.  Abdominal exam benign.  Lab Results: Recent Labs    04/26/22 1442 04/27/22 0409  NA 142 142  K 3.9 3.5  CL 108 110  CO2 22 23  GLUCOSE 144* 83  BUN 29* 28*  CREATININE 1.79* 1.75*  CALCIUM 10.8* 10.1   No results for input(s): "AST", "ALT", "ALKPHOS", "BILITOT", "PROT", "ALBUMIN" in the last 72 hours. Recent Labs    04/26/22 1442 04/27/22 0409 04/28/22 0014  WBC 5.4 5.6 5.2  NEUTROABS 2.9  --   --   HGB 13.7 11.2* 11.1*  HCT 42.3 34.7* 34.4*  MCV 97.0 95.9 96.9  PLT 139* 123* 108*   No results for input(s): "LABPROT", "INR" in the last 72 hours.    Assessment/Plan: -Achalasia/esophageal dysmotility with food impaction.  Status post EGD with food removal yesterday by Dr. Paulita Fujita. -Chronic anticoagulation with Eliquis.  Recommendations ------------------------ -Patient with esophageal dysmotility and at high risk for recurrent food impaction.  Recommend to stay on mechanical soft/chopped food.  No further inpatient GI workup planned.   -Okay to resume anticoagulation from GI standpoint.   -GI will sign off.  Call us back if needed.   Otis Brace MD, [facility] 04/28/2022, 11:30 AM  Contact #  [phone]

## 2022-04-28 NOTE — Plan of Care (Signed)
  Problem: Health Behavior/Discharge Planning: Goal: Ability to manage health-related needs will improve Outcome: Progressing   Problem: Activity: Goal: Risk for activity intolerance will decrease Outcome: Progressing   Problem: Nutrition: Goal: Adequate nutrition will be maintained Outcome: Progressing   

## 2022-04-28 NOTE — Discharge Summary (Signed)
Physician Discharge Summary  [name] [name] [id number] DOB: 1929-11-19 DOA: 04/26/2022  PCP: Ginger Organ., MD  Admit date: 04/26/2022 Discharge date: 04/28/2022 Discharging to: home Recommendations for Outpatient Follow-up:  Continue to remind the patient to only eat mechanical soft food  Consults:  GI Procedures:  EDG   Discharge Diagnoses:   Principal Problem:   Esophageal obstruction due to food impaction Active Problems:   Achalasia   CKD (chronic kidney disease) stage 4, GFR 15-29 ml/min (HCC)   Type 2 diabetes mellitus with hyperlipidemia ([place])   Essential hypertension   History of DVT (deep vein thrombosis)   Dementia without behavioral disturbance (HCC)   COPD (chronic obstructive pulmonary disease) [facility])   Hospital Course:  [name] [name], a 89 year old woman with a medical history that includes CKD stage IV, esophageal reflux, insulin-dependent type 2 diabetes, essential hypertension, and a history of multiple DVT/PE episodes necessitating Eliquis treatment, as well as undergoing an EGD in November 2022 for esophageal dysmotility, was admitted to the hospital due to ongoing nausea and vomiting persisting for the past 48 hours. She struggled to tolerate solid foods, liquids and was unable to retain any of her medications, including Eliquis and antihypertensives. In the ER, the provider attempted to have her drink some water, but she immediately regurgitated it. GI Dr. Paulita Fujita was consulted, and plans were made for an upper endoscopy under general anesthesia.   Primary Issue: Vomiting stemming from achalasia, and food obstruction. - EGD revealed food in the entire esophagus which was removed - she is advised not to eat more than mechanical soft    CKD (chronic kidney disease) stage 4, with a GFR of 15-29 ml/min (HCC) - Renal function remains stable.   Type 2 diabetes mellitus with hyperlipidemia ([place]).  - cont Lantus 10 U and - sliding scale insulin.    Essential hypertension, uncontrolled - unable to take oral meds at home due to vomiting  200/73 today-IV Hydralazine (PRN) given - Nitropaste added until she could resume orals - Q 6 IV Metoprolol switched back to Imdur, Coreg and Hydralzine   History of DVT (deep vein thrombosis) - Patient chronically on Eliquis due to multiple DVTs and PEs.   Dementia without behavioral disturbances    COPD (chronic obstructive pulmonary disease)  - Stable with room air, without exacerbation evidence.   Obesity Body mass index is 33.07 kg/m.         Discharge Instructions  Discharge Instructions     Diet - low sodium heart healthy   Complete by: As directed    Mechanical soft food   Increase activity slowly   Complete by: As directed       Allergies as of 04/28/2022       Reactions   Aspirin Nausea And Vomiting   Codeine Nausea And Vomiting   Ibuprofen Nausea And Vomiting   Aricept [donepezil] Other (See Comments)   Gi upset    Meperidine Hcl Nausea And Vomiting   Namenda [memantine] Other (See Comments)   Gi upset    Tape Other (See Comments)   ADHESIVE TAPE CAUSES BLISTERS   Augmentin [amoxicillin-pot Clavulanate] Other (See Comments)   GI upset        Medication List     TAKE these medications    acetaminophen 500 MG tablet Commonly known as: TYLENOL Take 1,000 mg by mouth in the morning and at bedtime.   albuterol 108 (90 Base) MCG/ACT inhaler Commonly known as: ProAir HFA 2 puffs every 4-6  hours as directed- rescue What changed:  how much to take how to take this when to take this reasons to take this additional instructions   carvedilol 6.25 MG tablet Commonly known as: COREG Take 6.25 mg by mouth 2 (two) times daily with a meal. What changed: Another medication with the same name was removed. Continue taking this medication, and follow the directions you see here.   Eliquis 2.5 MG Tabs tablet Generic drug: apixaban Take 1 tablet (2.5 mg total)  by mouth 2 (two) times daily.   furosemide 40 MG tablet Commonly known as: LASIX Take 1 tablet (40 mg total) by mouth daily.   hydrALAZINE 50 MG tablet Commonly known as: APRESOLINE Take 1 tablet (50 mg total) by mouth 3 (three) times daily. What changed: when to take this   isosorbide mononitrate 30 MG 24 hr tablet Commonly known as: IMDUR Take 1 tablet (30 mg total) by mouth daily.   loratadine 10 MG tablet Commonly known as: CLARITIN Take 10 mg by mouth daily as needed for allergies.   LYSINE PO Take 1,000 mg by mouth daily.   omeprazole 20 MG capsule Commonly known as: PRILOSEC Take 20 mg by mouth daily.   PRESCRIPTION MEDICATION Inhale 1 Dose into the lungs as needed (wheezing/SOB). Albuterol Nebulizer solution   simvastatin 40 MG tablet Commonly known as: ZOCOR Take 1 tablet (40 mg total) by mouth every evening. What changed: when to take this   Toujeo SoloStar 300 UNIT/ML Solostar Pen Generic drug: insulin glargine (1 Unit Dial) Inject 20 Units into the skin at bedtime.            The results of significant diagnostics from this hospitalization (including imaging, microbiology, ancillary and laboratory) are listed below for reference.    No results found. Labs:   Basic Metabolic Panel: Recent Labs  Lab 04/26/22 1442 04/27/22 0409  NA 142 142  K 3.9 3.5  CL 108 110  CO2 22 23  GLUCOSE 144* 83  BUN 29* 28*  CREATININE 1.79* 1.75*  CALCIUM 10.8* 10.1     CBC: Recent Labs  Lab 04/26/22 1442 04/27/22 0409 04/28/22 0014  WBC 5.4 5.6 5.2  NEUTROABS 2.9  --   --   HGB 13.7 11.2* 11.1*  HCT 42.3 34.7* 34.4*  MCV 97.0 95.9 96.9  PLT 139* 123* 108*         SIGNED:   Debbe Odea, MD  [facility] 04/28/2022, 1:57 PM

## 2022-04-28 NOTE — Discharge Instructions (Signed)
Food show be chopped into small pieces.   Please review all of you discharge paperwork on the day of discharge and be sure you have all of your prescribed medications.  Please request your Primary MD to go over all Hospital Tests and Procedure/Radiological results at the follow up Please get all Hospital records sent to your primary MD by signing hospital release before you go home.   In some cases, there will be blood work, cultures and biopsy results pending at the time of your discharge. Please request that your primary care M.D. goes through all the records of your hospital data and follows up on these results.  Please take all your medications with you for your next visit with your Primary MD   Please request your Primary MD to go over all hospital tests and procedure/radiological results at the follow up, please ask your Primary MD to get all Hospital records sent to his/her office.   You must read complete instructions/literature along with all the possible adverse reactions/side effects for all the Medicines you take and that have been prescribed to you. Take any new Medicines after you have completely understood and accpet all the possible adverse reactions/side effects.    Do not drive or operate heavy machinery when taking Pain medications.    Do not take more than prescribed Pain, Sleep and Anxiety Medications  If you have smoked or chewed Tobacco  in the last 2 yrs please stop smoking, stop any regular Alcohol  and or any Recreational drug use.   Wear Seat belts while driving.   If you had Pneumonia or Lung problems at the Hospital: Please get a 2 view Chest X ray done in 6-8 weeks after hospital discharge or sooner if instructed by your Primary MD.   If you have Congestive Heart Failure: Please call your Cardiologist or Primary MD anytime you have any of the following symptoms:  1) 3 pound weight gain in 24 hours or 5 pounds in 1 week  2) shortness of breath, with or  without a dry hacking cough  3) swelling in the hands, feet or stomach  4) if you have to sleep on extra pillows at night in order to breathe 5) Follow cardiac low salt diet and 1.5 lit/day fluid restriction.   If you have Diabetes Accuchecks 4 times/day- once on AM empty stomach and then before each meal. Log in all results and show them to your primary doctor at your next visit. If any glucose reading is under 60 or above 400 call your primary MD immediately.   If you have Seizure/Convulsions/Epilepsy: Please do not drive, operate heavy machinery, participate in activities at heights or participate in high speed sports until you have seen by Primary MD or a Neurologist and advised to do so again. Per [facility] statutes, patients with seizures are not allowed to drive until they have been seizure-free for six months.  Use caution when using heavy equipment or power tools. Avoid working on ladders or at heights. Take showers instead of baths. Ensure the water temperature is not too high on the home water heater. Do not go swimming alone. Do not lock yourself in a room alone (i.e. bathroom). When caring for infants or small children, sit down when holding, feeding, or changing them to minimize risk of injury to the child in the event you have a seizure. Maintain good sleep hygiene. Avoid alcohol.    If you had Gastrointestinal Bleeding: Please ask your Primary MD  to check a complete blood count within one week of discharge or at your next visit. Your endoscopic/colonoscopic biopsies that are pending at the time of discharge, will also need to followed by your Primary MD.  Please note You were cared for by a hospitalist during your hospital stay. If you have any questions about your discharge medications or the care you received while you were in the hospital after you are discharged, you can call the unit and asked to speak with the hospitalist on call if the hospitalist that took care of  you is not available. Once you are discharged, your primary care physician will handle any further medical issues. Please note that NO REFILLS for any discharge medications will be authorized once you are discharged, as it is imperative that you return to your primary care physician (or establish a relationship with a primary care physician if you do not have one) for your aftercare needs so that they can reassess your need for medications and monitor your lab values.   You can reach the hospitalist office at phone [phone] or fax [phone]   If you do not have a primary care physician, you can call [phone] for a physician referral.

## 2022-04-28 NOTE — Progress Notes (Signed)
[facility] for IV heparin  Indication: pulmonary embolus and DVT  Allergies  Allergen Reactions   Aspirin Nausea And Vomiting   Codeine Nausea And Vomiting   Ibuprofen Nausea And Vomiting   Aricept [Donepezil] Other (See Comments)    Gi upset    Meperidine Hcl Nausea And Vomiting   Namenda [Memantine] Other (See Comments)    Gi upset    Tape Other (See Comments)    ADHESIVE TAPE CAUSES BLISTERS   Augmentin [Amoxicillin-Pot Clavulanate] Other (See Comments)    GI upset    Patient Measurements: Height: 5' 4"$  (162.6 cm) Weight: 87.4 kg (192 lb 10.9 oz) IBW/kg (Calculated) : 54.7 Heparin Dosing Weight: 74.1 kg  Vital Signs: Temp: 98.1 F (36.7 C) (02/17 0116) Temp Source: Oral (02/17 0116) BP: 143/42 (02/17 0116) Pulse Rate: 74 (02/17 0116)  Labs: Recent Labs    04/26/22 1442 04/27/22 0409 04/28/22 0014  HGB 13.7 11.2* 11.1*  HCT 42.3 34.7* 34.4*  PLT 139* 123* 108*  APTT  --   --  109*  HEPARINUNFRC  --   --  1.03*  CREATININE 1.79* 1.75*  --      Estimated Creatinine Clearance: 22.9 mL/min (A) (by C-G formula based on SCr of 1.75 mg/dL (H)).   Medical History: Past Medical History:  Diagnosis Date   Allergic rhinitis, cause unspecified    Asthma    CKD (chronic kidney disease) stage 4, GFR 15-29 ml/min (HCC) 01/20/2021   Esophageal reflux    Sleep apnea    on C-pap- Dr young follows   Syncope 2019   Normal LVF, negative monitor   Type II or unspecified type diabetes mellitus without mention of complication, not stated as uncontrolled    Unspecified essential hypertension    mild LVH, normal LVF, grade 2 DD by echo 2019    Medications:  Medications Prior to Admission  Medication Sig Dispense Refill Last Dose   acetaminophen (TYLENOL) 500 MG tablet Take 1,000 mg by mouth in the morning and at bedtime.   04/23/2022   albuterol (PROAIR HFA) 108 (90 Base) MCG/ACT inhaler 2 puffs every 4-6 hours as directed- rescue  (Patient taking differently: Inhale 2 puffs into the lungs as needed for wheezing or shortness of breath.) 1 each prn december   carvedilol (COREG) 6.25 MG tablet Take 6.25 mg by mouth 2 (two) times daily with a meal.   04/26/2022 at 0900   ELIQUIS 2.5 MG TABS tablet Take 1 tablet (2.5 mg total) by mouth 2 (two) times daily. 60 tablet 0 04/26/2022 at 0900   furosemide (LASIX) 40 MG tablet Take 1 tablet (40 mg total) by mouth daily. 30 tablet 0 04/26/2022   hydrALAZINE (APRESOLINE) 50 MG tablet Take 1 tablet (50 mg total) by mouth 3 (three) times daily. (Patient taking differently: Take 50 mg by mouth in the morning and at bedtime.) 90 tablet 0 04/26/2022   isosorbide mononitrate (IMDUR) 30 MG 24 hr tablet Take 1 tablet (30 mg total) by mouth daily. 30 tablet 0 04/26/2022   loratadine (CLARITIN) 10 MG tablet Take 10 mg by mouth daily as needed for allergies.   unk   LYSINE PO Take 1,000 mg by mouth daily.   04/23/2022   omeprazole (PRILOSEC) 20 MG capsule Take 20 mg by mouth daily.   04/26/2022   PRESCRIPTION MEDICATION Inhale 1 Dose into the lungs as needed (wheezing/SOB). Albuterol Nebulizer solution   december   simvastatin (ZOCOR) 40 MG tablet Take 1  tablet (40 mg total) by mouth every evening. (Patient taking differently: Take 40 mg by mouth at bedtime.) 30 tablet 0 04/23/2022   TOUJEO SOLOSTAR 300 UNIT/ML Solostar Pen Inject 20 Units into the skin at bedtime.   04/23/2022   carvedilol (COREG) 3.125 MG tablet Take 1 tablet (3.125 mg total) by mouth 2 (two) times daily with a meal. (Patient not taking: Reported on 04/26/2022) 60 tablet 0 Not Taking    Assessment: Pharmacy is consulted to start heparin drip in 89 yo female with PMH of PE/DVT. Pt on apixaban PTA. Med on hold for EGD scheduled for 2/16.   Per med rec, last dose of apixaban was on 2/15 at 0930.   Today, 04/28/22 Heparin level 1.03 (falsely elevated due to lingering effects of apixaban) aPTT = 109 sec (supratherapeutic) with heparin gtt @  1250 units/hr Hgb 11.1 g/dL, plt 108 K/uL RN reported that when an IV was removed there was bleeding but it was controlled and currently no bleeding complications.  Goal of Therapy:  Heparin level 0.3-0.7 units/ml aPTT 66-102 sec Monitor platelets by anticoagulation protocol: Yes   Plan:  -Decrease heparin gtt to 1150 units/hr -Will follow aPTT in setting of PTA apixaban until correlation with anti-Xa. -Check aPTT level in 8 hours  -Daily CBC and heparin level -Continue to monitor H&H and platelets -Monitor for signs/ symptoms of bleeding.  Leone Haven, PharmD 04/28/2022 3:15 AM

## 2022-04-29 ENCOUNTER — Encounter (HOSPITAL_COMMUNITY): Payer: Self-pay | Admitting: Gastroenterology

## 2022-06-29 ENCOUNTER — Other Ambulatory Visit: Payer: Self-pay | Admitting: Gastroenterology

## 2022-07-11 ENCOUNTER — Encounter (HOSPITAL_COMMUNITY)
Admission: RE | Disposition: A | Discharge status: Home / Self Care | Payer: Self-pay | Source: Ambulatory Visit | Attending: Gastroenterology

## 2022-07-11 ENCOUNTER — Ambulatory Visit (HOSPITAL_COMMUNITY)
Admission: RE | Admit: 2022-07-11 | Discharge: 2022-07-11 | Disposition: A | Discharge status: Home / Self Care | Payer: Medicare Other | Source: Ambulatory Visit | Attending: Gastroenterology | Admitting: Gastroenterology

## 2022-07-11 DIAGNOSIS — R1310 Dysphagia, unspecified: Secondary | ICD-10-CM | POA: Insufficient documentation

## 2022-07-11 HISTORY — PX: ESOPHAGEAL MANOMETRY: SHX5429

## 2022-07-11 SURGERY — MANOMETRY, ESOPHAGUS

## 2022-07-11 MED ORDER — LIDOCAINE VISCOUS HCL 2 % MT SOLN
OROMUCOSAL | Status: AC
  Filled 2022-07-11: qty 15

## 2022-07-11 SURGICAL SUPPLY — 2 items
FACESHIELD LNG OPTICON STERILE (SAFETY) IMPLANT
GLOVE BIO SURGEON STRL SZ8 (GLOVE) ×2 IMPLANT

## 2022-07-11 NOTE — Progress Notes (Signed)
Esophageal Manometry done per protocol. Patient tolerated well without distress or complication. Norman Clay RN at bedside during insertion of probe. Patient did well and no complications.

## 2022-07-16 ENCOUNTER — Encounter (HOSPITAL_COMMUNITY): Payer: Self-pay | Admitting: Gastroenterology

## 2022-07-16 ENCOUNTER — Other Ambulatory Visit: Payer: Self-pay

## 2022-07-16 ENCOUNTER — Emergency Department (HOSPITAL_COMMUNITY)
Admission: EM | Admit: 2022-07-16 | Discharge: 2022-07-16 | Disposition: A | Discharge status: Home / Self Care | Payer: Medicare Other | Attending: Emergency Medicine | Admitting: Emergency Medicine

## 2022-07-16 ENCOUNTER — Emergency Department (HOSPITAL_COMMUNITY): Payer: Medicare Other

## 2022-07-16 DIAGNOSIS — N390 Urinary tract infection, site not specified: Secondary | ICD-10-CM | POA: Insufficient documentation

## 2022-07-16 DIAGNOSIS — R319 Hematuria, unspecified: Secondary | ICD-10-CM | POA: Insufficient documentation

## 2022-07-16 DIAGNOSIS — Z7901 Long term (current) use of anticoagulants: Secondary | ICD-10-CM | POA: Diagnosis not present

## 2022-07-16 DIAGNOSIS — R519 Headache, unspecified: Secondary | ICD-10-CM

## 2022-07-16 LAB — COMPREHENSIVE METABOLIC PANEL
ALT: 11 U/L (ref 0–44)
AST: 17 U/L (ref 15–41)
Albumin: 3.9 g/dL (ref 3.5–5.0)
Alkaline Phosphatase: 50 U/L (ref 38–126)
Anion gap: 10 (ref 5–15)
BUN: 37 mg/dL — ABNORMAL HIGH (ref 8–23)
CO2: 21 mmol/L — ABNORMAL LOW (ref 22–32)
Calcium: 10.2 mg/dL (ref 8.9–10.3)
Chloride: 109 mmol/L (ref 98–111)
Creatinine, Ser: 2.14 mg/dL — ABNORMAL HIGH (ref 0.44–1.00)
GFR, Estimated: 21 mL/min — ABNORMAL LOW (ref >60)
Glucose, Bld: 134 mg/dL — ABNORMAL HIGH (ref 70–99)
Potassium: 3.9 mmol/L (ref 3.5–5.1)
Sodium: 140 mmol/L (ref 135–145)
Total Bilirubin: 0.7 mg/dL (ref 0.3–1.2)
Total Protein: 6.5 g/dL (ref 6.5–8.1)

## 2022-07-16 LAB — CBC
HCT: 37.5 % (ref 36.0–46.0)
Hemoglobin: 12.2 g/dL (ref 12.0–15.0)
MCH: 31 pg (ref 26.0–34.0)
MCHC: 32.5 g/dL (ref 30.0–36.0)
MCV: 95.2 fL (ref 80.0–100.0)
Platelets: 104 10*3/uL — ABNORMAL LOW (ref 150–400)
RBC: 3.94 MIL/uL (ref 3.87–5.11)
RDW: 13 % (ref 11.5–15.5)
WBC: 4.3 10*3/uL (ref 4.0–10.5)
nRBC: 0 % (ref 0.0–0.2)

## 2022-07-16 LAB — URINALYSIS, ROUTINE W REFLEX MICROSCOPIC
Bilirubin Urine: NEGATIVE
Glucose, UA: NEGATIVE mg/dL
Ketones, ur: NEGATIVE mg/dL
Nitrite: NEGATIVE
Protein, ur: 30 mg/dL — AB
Specific Gravity, Urine: 1.012 (ref 1.005–1.030)
pH: 5 (ref 5.0–8.0)

## 2022-07-16 MED ORDER — SODIUM CHLORIDE 0.9 % IV SOLN
1.0000 g | Freq: Once | INTRAVENOUS | Status: AC
  Administered 2022-07-16: 1 g via INTRAVENOUS
  Filled 2022-07-16: qty 10

## 2022-07-16 MED ORDER — HYDRALAZINE HCL 25 MG PO TABS
50.0000 mg | ORAL_TABLET | Freq: Once | ORAL | Status: AC
  Administered 2022-07-16: 50 mg via ORAL
  Filled 2022-07-16: qty 2

## 2022-07-16 NOTE — ED Notes (Signed)
Discharged instructions reviewed with pt and caregiver. Opportunity given for questions. Patient discharged from ED. Taken to ED entrance via wheelchair.

## 2022-07-16 NOTE — Discharge Instructions (Signed)
You were evaluated for headache and had a normal head ct without evidence of bleeding. You appear to have a urinary tract infection and were treated with iv antibiotics Return if worse Recheck with your doctor this week.

## 2022-07-16 NOTE — ED Notes (Addendum)
Patient having runs of ventricular tachycardia, MD made aware.

## 2022-07-16 NOTE — ED Notes (Signed)
Family at bedside. 

## 2022-07-16 NOTE — ED Provider Notes (Signed)
[place] EMERGENCY DEPARTMENT AT [facility] Provider Note   CSN: [id number] Arrival date & time: 07/16/22  1126     History {Add pertinent medical, surgical, social history, OB history to HPI:1} Chief Complaint  Patient presents with   Headache    [name] [name] is a 89 y.o. female.  HPI 89 year old female history of A-fib on Eliquis presents today via EMS with report of headache and episode of aphasia.  EMS reports that patient was last known normal at 8 AM.  She was well until 830.  830 she began complaining of some headache radiating from the back of her head to the front.  At 930 she had episode of perceived slurring of speech by family.  Patient does not recall that she had any problems with this.  She reported to resolved at 945.  EMS was called during that time and arrived at approximately 1030.  Patient was asymptomatic on their arrival.  They reported initial blood pressure elevated with systolic of 188, no focal neurological exam findings, and CBG normal at 120.  Patient denies any previous headaches. Patient denies chest pain, dyspnea, nausea, vomiting, diarrhea, vision changes, lateralized weakness.  She states that she normally walks with a walker and has not had any recent falls. EMS reports that daughter was at the scene and that she reported the symptoms.  She was reported to be coming to the hospital but is not arrived on my initial evaluation     Home Medications Prior to Admission medications   Medication Sig Start Date End Date Taking? Authorizing Provider  acetaminophen (TYLENOL) 500 MG tablet Take 1,000 mg by mouth in the morning and at bedtime.    [provider]  albuterol (PROAIR HFA) 108 (90 Base) MCG/ACT inhaler 2 puffs every 4-6 hours as directed- rescue Patient taking differently: Inhale 2 puffs into the lungs as needed for wheezing or shortness of breath. 02/17/21   Medina-Vargas, Monina C, NP  carvedilol (COREG) 6.25 MG tablet Take  6.25 mg by mouth 2 (two) times daily with a meal.    [provider]  ELIQUIS 2.5 MG TABS tablet Take 1 tablet (2.5 mg total) by mouth 2 (two) times daily. 02/17/21   Medina-Vargas, Monina C, NP  furosemide (LASIX) 40 MG tablet Take 1 tablet (40 mg total) by mouth daily. 02/19/22   Zannie Cove, MD  hydrALAZINE (APRESOLINE) 50 MG tablet Take 1 tablet (50 mg total) by mouth 3 (three) times daily. Patient taking differently: Take 50 mg by mouth in the morning and at bedtime. 02/19/22   Zannie Cove, MD  isosorbide mononitrate (IMDUR) 30 MG 24 hr tablet Take 1 tablet (30 mg total) by mouth daily. 02/19/22   Zannie Cove, MD  loratadine (CLARITIN) 10 MG tablet Take 10 mg by mouth daily as needed for allergies.    [provider]  LYSINE PO Take 1,000 mg by mouth daily.    [provider]  omeprazole (PRILOSEC) 20 MG capsule Take 20 mg by mouth daily.    [provider]  PRESCRIPTION MEDICATION Inhale 1 Dose into the lungs as needed (wheezing/SOB). Albuterol Nebulizer solution    [provider]  simvastatin (ZOCOR) 40 MG tablet Take 1 tablet (40 mg total) by mouth every evening. Patient taking differently: Take 40 mg by mouth at bedtime. 02/17/21   Medina-Vargas, Monina C, NP  TOUJEO SOLOSTAR 300 UNIT/ML Solostar Pen Inject 20 Units into the skin at bedtime. 01/03/22   [provider]  Allergies    Aspirin, Codeine, Ibuprofen, Aricept [donepezil], Meperidine hcl, Namenda [memantine], Tape, and Augmentin [amoxicillin-pot clavulanate]    Review of Systems   Review of Systems  Physical Exam Updated Vital Signs BP (!) 199/74   Pulse 66   Temp 98.3 F (36.8 C)   Resp 12   SpO2 100%  Physical Exam Vitals reviewed.  Constitutional:      General: She is not in acute distress.    Appearance: She is not ill-appearing.  HENT:     Head: Normocephalic and atraumatic.  Eyes:     General: No visual field deficit.    Extraocular  Movements: Extraocular movements intact.     Pupils: Pupils are equal, round, and reactive to light.  Cardiovascular:     Rate and Rhythm: Normal rate and regular rhythm.     Heart sounds: Normal heart sounds.  Pulmonary:     Effort: Pulmonary effort is normal.     Breath sounds: Normal breath sounds.  Abdominal:     General: Bowel sounds are normal.     Palpations: Abdomen is soft.  Musculoskeletal:        General: Normal range of motion.     Cervical back: Normal range of motion and neck supple.  Skin:    General: Skin is warm and dry.     Capillary Refill: Capillary refill takes less than 2 seconds.  Neurological:     Mental Status: She is alert. Mental status is at baseline.     Cranial Nerves: No cranial nerve deficit, dysarthria or facial asymmetry.     Sensory: No sensory deficit.     Motor: Weakness present.     Coordination: Romberg sign negative. Coordination abnormal.     Deep Tendon Reflexes: Reflexes normal.  Psychiatric:        Mood and Affect: Mood normal.        Behavior: Behavior normal.     ED Results / Procedures / Treatments   Labs (all labs ordered are listed, but only abnormal results are displayed) Labs Reviewed  CBC - Abnormal; Notable for the following components:      Result Value   Platelets 104 (*)    All other components within normal limits  COMPREHENSIVE METABOLIC PANEL - Abnormal; Notable for the following components:   CO2 21 (*)    Glucose, Bld 134 (*)    BUN 37 (*)    Creatinine, Ser 2.14 (*)    GFR, Estimated 21 (*)    All other components within normal limits    EKG EKG Interpretation  Date/Time:  Monday Jul 16 2022 11:25:58 EDT Ventricular Rate:  67 PR Interval:  254 QRS Duration: 143 QT Interval:  435 QTC Calculation: 460 R Axis:   -17 Text Interpretation: Sinus or ectopic atrial rhythm Prolonged PR interval Right bundle branch block afib has resolved and rbbb completed since last tracing of 15 February 2022 Confirmed by  Margarita Grizzle [phone]) on 07/16/2022 11:32:29 AM  Radiology CT Head Wo Contrast  Result Date: 07/16/2022 CLINICAL DATA:  Headache, neuro deficit EXAM: CT HEAD WITHOUT CONTRAST TECHNIQUE: Contiguous axial images were obtained from the base of the skull through the vertex without intravenous contrast. RADIATION DOSE REDUCTION: This exam was performed according to the departmental dose-optimization program which includes automated exposure control, adjustment of the mA and/or kV according to patient size and/or use of iterative reconstruction technique. COMPARISON:  Head CT 01/20/2021 FINDINGS: Brain: No acute intracranial hemorrhage. No focal mass lesion. No  CT evidence of acute infarction. No midline shift or mass effect. No hydrocephalus. Basilar cisterns are patent. There are periventricular and subcortical white matter hypodensities. Generalized cortical atrophy. Vascular: No hyperdense vessel or unexpected calcification. Skull: Normal. Negative for fracture or focal lesion. Sinuses/Orbits: Paranasal sinuses and mastoid air cells are clear. Orbits are clear. Other: None. IMPRESSION: 1. No acute intracranial findings. 2. Atrophy and white matter microvascular disease. Electronically Signed   By: Genevive Bi M.D.   On: 07/16/2022 12:52    Procedures Procedures  {Document cardiac monitor, telemetry assessment procedure when appropriate:1}  Medications Ordered in ED Medications - No data to display  ED Course/ Medical Decision Making/ A&P Clinical Course as of 07/16/22 1259  Mon Jul 16, 2022  1258 CT head reviewed interpreted and no evidence of bleeding but atrophy is noted on my interpretation radiologist interpretation notes acute intracranial findings atrophy and white matter microvascular disease [DR]    Clinical Course User Index [DR] Margarita Grizzle, MD   {   Click here for ABCD2, HEART and other calculatorsREFRESH Note before signing :1}                          Medical Decision  Making Amount and/or Complexity of Data Reviewed Labs: ordered. Radiology: ordered.  Risk Prescription drug management.  89 year old female history of A-fib on Eliquis presents today complaining of headache and episode of aphasia.  No other neurological deficits were noted per patient, EMS report. Here on exam neurological exam is normal Differential diagnosis includes but is not limited to headache due to acute intracranial hemorrhage, stroke, TIA, hypertensive urgency, other etiologies such as metabolic abnormalities including but not limited to hypoglycemia, acute electrolyte disturbances, infection. CT obtained of the head and no evidence of acute intracranial hemorrhage or other acute abnormalities Patient has been observed on the monitor.  She has had runs that were noted and concerning for V-fib per monitor and nursing.  I have reviewed the strips and these appear to be movement for episodes of A-fib.  Do not appear to be runs of V. tach. Labs are reviewed and patient has normal white count, normal hemoglobin and some thrombocytopenia with platelets of 104,000 this stable from prior Creatinine is 2.14 with review of creatinines ranging from 1.7-2.8 .  Daughter is still not in room. Patient does not appear of any acute bleeding.  There was a question of an episode of aphasia.  However patient is 89 years old and is DNR.  She is back to baseline functioning.  She is on Eliquis.  We will attempt to contact her daughter and determine if there is any validity to patient's accusations. Patient is on Eliquis and anticoagulated and unlikely to require further medical intervention. Daughter is now at bedside.  She gives further history.  She states the patient has had some increased agitation with her dementia since Thursday.  She was up and down several times last night.  She has been complaining of headaches and sometimes during the night last night.  She did have an episode of change in her speech.   This has since resolved. We have discussed above findings.  Patient is on palliative care.  Given that patient has significant dementia and he is on chronic anticoagulation, do not feel there be further advantage to keeping the patient in the hospital. However given the recent history of frequency of urination, we will check urinalysis. {Document critical care time when appropriate:1} {Document review  of labs and clinical decision tools ie heart score, Chads2Vasc2 etc:1}  {Document your independent review of radiology images, and any outside records:1} {Document your discussion with family members, caretakers, and with consultants:1} {Document social determinants of health affecting pt's care:1} {Document your decision making why or why not admission, treatments were needed:1} Final Clinical Impression(s) / ED Diagnoses Final diagnoses:  None    Rx / DC Orders ED Discharge Orders     None

## 2022-07-16 NOTE — ED Triage Notes (Signed)
Pt BIBGEMS from home with a ha that went from occipital to behind her eyes. Pt Last seen normal 2000, at 0800 woke pt complains at 0830 headache started. 0930 pt has slurred speech for 15 minutes. EMS arrival at 1030 with no symptoms present beside ha. On eliquis.   188/98 68 hr 100 ra 132 CBG

## 2022-09-21 ENCOUNTER — Other Ambulatory Visit: Payer: Self-pay

## 2023-07-11 ENCOUNTER — Other Ambulatory Visit: Payer: Self-pay

## 2023-07-11 ENCOUNTER — Emergency Department (HOSPITAL_COMMUNITY)

## 2023-07-11 ENCOUNTER — Inpatient Hospital Stay (HOSPITAL_COMMUNITY)
Admission: EM | Admit: 2023-07-11 | Discharge: 2023-07-15 | DRG: 392 | Disposition: A | Discharge status: Home / Self Care | Attending: Family Medicine | Admitting: Family Medicine

## 2023-07-11 DIAGNOSIS — Z881 Allergy status to other antibiotic agents status: Secondary | ICD-10-CM

## 2023-07-11 DIAGNOSIS — D696 Thrombocytopenia, unspecified: Secondary | ICD-10-CM | POA: Insufficient documentation

## 2023-07-11 DIAGNOSIS — Z7901 Long term (current) use of anticoagulants: Secondary | ICD-10-CM

## 2023-07-11 DIAGNOSIS — Z90710 Acquired absence of both cervix and uterus: Secondary | ICD-10-CM

## 2023-07-11 DIAGNOSIS — Z833 Family history of diabetes mellitus: Secondary | ICD-10-CM

## 2023-07-11 DIAGNOSIS — J4489 Other specified chronic obstructive pulmonary disease: Secondary | ICD-10-CM | POA: Diagnosis present

## 2023-07-11 DIAGNOSIS — E669 Obesity, unspecified: Secondary | ICD-10-CM | POA: Diagnosis present

## 2023-07-11 DIAGNOSIS — K224 Dyskinesia of esophagus: Principal | ICD-10-CM | POA: Diagnosis present

## 2023-07-11 DIAGNOSIS — Z66 Do not resuscitate: Secondary | ICD-10-CM | POA: Diagnosis present

## 2023-07-11 DIAGNOSIS — I10 Essential (primary) hypertension: Secondary | ICD-10-CM | POA: Diagnosis present

## 2023-07-11 DIAGNOSIS — Z6834 Body mass index (BMI) 34.0-34.9, adult: Secondary | ICD-10-CM

## 2023-07-11 DIAGNOSIS — E785 Hyperlipidemia, unspecified: Secondary | ICD-10-CM | POA: Diagnosis present

## 2023-07-11 DIAGNOSIS — Z794 Long term (current) use of insulin: Secondary | ICD-10-CM

## 2023-07-11 DIAGNOSIS — E1122 Type 2 diabetes mellitus with diabetic chronic kidney disease: Secondary | ICD-10-CM | POA: Diagnosis present

## 2023-07-11 DIAGNOSIS — N3000 Acute cystitis without hematuria: Secondary | ICD-10-CM | POA: Insufficient documentation

## 2023-07-11 DIAGNOSIS — Z86718 Personal history of other venous thrombosis and embolism: Secondary | ICD-10-CM

## 2023-07-11 DIAGNOSIS — Z886 Allergy status to analgesic agent status: Secondary | ICD-10-CM

## 2023-07-11 DIAGNOSIS — Z91199 Patient's noncompliance with other medical treatment and regimen due to unspecified reason: Secondary | ICD-10-CM

## 2023-07-11 DIAGNOSIS — E860 Dehydration: Secondary | ICD-10-CM | POA: Diagnosis not present

## 2023-07-11 DIAGNOSIS — Z555 Less than a high school diploma: Secondary | ICD-10-CM

## 2023-07-11 DIAGNOSIS — R1310 Dysphagia, unspecified: Secondary | ICD-10-CM | POA: Diagnosis not present

## 2023-07-11 DIAGNOSIS — J309 Allergic rhinitis, unspecified: Secondary | ICD-10-CM | POA: Diagnosis present

## 2023-07-11 DIAGNOSIS — Z888 Allergy status to other drugs, medicaments and biological substances status: Secondary | ICD-10-CM

## 2023-07-11 DIAGNOSIS — I5032 Chronic diastolic (congestive) heart failure: Secondary | ICD-10-CM | POA: Diagnosis present

## 2023-07-11 DIAGNOSIS — E66812 Obesity, class 2: Secondary | ICD-10-CM | POA: Diagnosis present

## 2023-07-11 DIAGNOSIS — G4733 Obstructive sleep apnea (adult) (pediatric): Secondary | ICD-10-CM | POA: Diagnosis present

## 2023-07-11 DIAGNOSIS — Z91048 Other nonmedicinal substance allergy status: Secondary | ICD-10-CM

## 2023-07-11 DIAGNOSIS — N184 Chronic kidney disease, stage 4 (severe): Secondary | ICD-10-CM | POA: Diagnosis present

## 2023-07-11 DIAGNOSIS — K219 Gastro-esophageal reflux disease without esophagitis: Secondary | ICD-10-CM | POA: Diagnosis present

## 2023-07-11 DIAGNOSIS — J449 Chronic obstructive pulmonary disease, unspecified: Secondary | ICD-10-CM | POA: Diagnosis present

## 2023-07-11 DIAGNOSIS — Z885 Allergy status to narcotic agent status: Secondary | ICD-10-CM

## 2023-07-11 DIAGNOSIS — E1169 Type 2 diabetes mellitus with other specified complication: Secondary | ICD-10-CM | POA: Diagnosis present

## 2023-07-11 DIAGNOSIS — F0390 Unspecified dementia without behavioral disturbance: Secondary | ICD-10-CM | POA: Diagnosis present

## 2023-07-11 DIAGNOSIS — Z79899 Other long term (current) drug therapy: Secondary | ICD-10-CM

## 2023-07-11 DIAGNOSIS — I130 Hypertensive heart and chronic kidney disease with heart failure and stage 1 through stage 4 chronic kidney disease, or unspecified chronic kidney disease: Secondary | ICD-10-CM | POA: Diagnosis present

## 2023-07-11 LAB — COMPREHENSIVE METABOLIC PANEL WITH GFR
ALT: 10 U/L (ref 0–44)
AST: 19 U/L (ref 15–41)
Albumin: 3.9 g/dL (ref 3.5–5.0)
Alkaline Phosphatase: 44 U/L (ref 38–126)
Anion gap: 13 (ref 5–15)
BUN: 31 mg/dL — ABNORMAL HIGH (ref 8–23)
CO2: 20 mmol/L — ABNORMAL LOW (ref 22–32)
Calcium: 10.6 mg/dL — ABNORMAL HIGH (ref 8.9–10.3)
Chloride: 106 mmol/L (ref 98–111)
Creatinine, Ser: 1.82 mg/dL — ABNORMAL HIGH (ref 0.44–1.00)
GFR, Estimated: 26 mL/min — ABNORMAL LOW (ref >60)
Glucose, Bld: 116 mg/dL — ABNORMAL HIGH (ref 70–99)
Potassium: 4 mmol/L (ref 3.5–5.1)
Sodium: 139 mmol/L (ref 135–145)
Total Bilirubin: 1.1 mg/dL (ref 0.0–1.2)
Total Protein: 7.1 g/dL (ref 6.5–8.1)

## 2023-07-11 LAB — CBC WITH DIFFERENTIAL/PLATELET
Abs Immature Granulocytes: 0.15 10*3/uL — ABNORMAL HIGH (ref 0.00–0.07)
Basophils Absolute: 0 10*3/uL (ref 0.0–0.1)
Basophils Relative: 0 %
Eosinophils Absolute: 0 10*3/uL (ref 0.0–0.5)
Eosinophils Relative: 0 %
HCT: 39.5 % (ref 36.0–46.0)
Hemoglobin: 12.9 g/dL (ref 12.0–15.0)
Immature Granulocytes: 2 %
Lymphocytes Relative: 27 %
Lymphs Abs: 2.3 10*3/uL (ref 0.7–4.0)
MCH: 30.5 pg (ref 26.0–34.0)
MCHC: 32.7 g/dL (ref 30.0–36.0)
MCV: 93.4 fL (ref 80.0–100.0)
Monocytes Absolute: 0.6 10*3/uL (ref 0.1–1.0)
Monocytes Relative: 7 %
Neutro Abs: 5.3 10*3/uL (ref 1.7–7.7)
Neutrophils Relative %: 64 %
Platelets: 108 10*3/uL — ABNORMAL LOW (ref 150–400)
RBC: 4.23 MIL/uL (ref 3.87–5.11)
RDW: 14 % (ref 11.5–15.5)
WBC: 8.4 10*3/uL (ref 4.0–10.5)
nRBC: 0 % (ref 0.0–0.2)

## 2023-07-11 LAB — I-STAT CHEM 8, ED
BUN: 28 mg/dL — ABNORMAL HIGH (ref 8–23)
Calcium, Ion: 1.36 mmol/L (ref 1.15–1.40)
Chloride: 108 mmol/L (ref 98–111)
Creatinine, Ser: 1.9 mg/dL — ABNORMAL HIGH (ref 0.44–1.00)
Glucose, Bld: 110 mg/dL — ABNORMAL HIGH (ref 70–99)
HCT: 39 % (ref 36.0–46.0)
Hemoglobin: 13.3 g/dL (ref 12.0–15.0)
Potassium: 4 mmol/L (ref 3.5–5.1)
Sodium: 140 mmol/L (ref 135–145)
TCO2: 21 mmol/L — ABNORMAL LOW (ref 22–32)

## 2023-07-11 LAB — GLUCOSE, CAPILLARY: Glucose-Capillary: 102 mg/dL — ABNORMAL HIGH (ref 70–99)

## 2023-07-11 LAB — LIPASE, BLOOD: Lipase: 21 U/L (ref 11–51)

## 2023-07-11 MED ORDER — ACETAMINOPHEN 325 MG PO TABS
650.0000 mg | ORAL_TABLET | Freq: Four times a day (QID) | ORAL | Status: DC | PRN
  Administered 2023-07-14: 650 mg via ORAL
  Filled 2023-07-11: qty 2

## 2023-07-11 MED ORDER — SENNOSIDES-DOCUSATE SODIUM 8.6-50 MG PO TABS: 1.0000 | ORAL_TABLET | Freq: Every evening | ORAL | Status: DC | PRN

## 2023-07-11 MED ORDER — ONDANSETRON HCL 4 MG PO TABS: 4.0000 mg | ORAL_TABLET | Freq: Four times a day (QID) | ORAL | Status: DC | PRN

## 2023-07-11 MED ORDER — INSULIN ASPART 100 UNIT/ML IJ SOLN
0.0000 [IU] | INTRAMUSCULAR | Status: DC
  Administered 2023-07-12: 2 [IU] via SUBCUTANEOUS
  Administered 2023-07-12: 8 [IU] via SUBCUTANEOUS
  Administered 2023-07-13: 2 [IU] via SUBCUTANEOUS
  Administered 2023-07-13: 3 [IU] via SUBCUTANEOUS
  Administered 2023-07-13 – 2023-07-15 (×4): 2 [IU] via SUBCUTANEOUS
  Filled 2023-07-11: qty 0.15

## 2023-07-11 MED ORDER — ACETAMINOPHEN 650 MG RE SUPP: 650.0000 mg | Freq: Four times a day (QID) | RECTAL | Status: DC | PRN

## 2023-07-11 MED ORDER — PANTOPRAZOLE SODIUM 40 MG PO TBEC
40.0000 mg | DELAYED_RELEASE_TABLET | Freq: Every day | ORAL | Status: DC
  Administered 2023-07-12: 40 mg via ORAL
  Filled 2023-07-11: qty 1

## 2023-07-11 MED ORDER — ALBUTEROL SULFATE HFA 108 (90 BASE) MCG/ACT IN AERS: 2.0000 | INHALATION_SPRAY | Freq: Four times a day (QID) | RESPIRATORY_TRACT | Status: DC | PRN

## 2023-07-11 MED ORDER — SODIUM CHLORIDE 0.9 % IV BOLUS
1000.0000 mL | Freq: Once | INTRAVENOUS | Status: AC
  Administered 2023-07-11: 1000 mL via INTRAVENOUS

## 2023-07-11 MED ORDER — APIXABAN 2.5 MG PO TABS
2.5000 mg | ORAL_TABLET | Freq: Two times a day (BID) | ORAL | Status: DC
  Administered 2023-07-11 – 2023-07-12 (×2): 2.5 mg via ORAL
  Filled 2023-07-11 (×2): qty 1

## 2023-07-11 MED ORDER — ALBUTEROL SULFATE (2.5 MG/3ML) 0.083% IN NEBU: 2.5000 mg | INHALATION_SOLUTION | Freq: Four times a day (QID) | RESPIRATORY_TRACT | Status: DC | PRN

## 2023-07-11 MED ORDER — SODIUM CHLORIDE 0.9 % IV SOLN: INTRAVENOUS | Status: AC

## 2023-07-11 MED ORDER — FOSFOMYCIN TROMETHAMINE 3 G PO PACK
3.0000 g | PACK | Freq: Once | ORAL | Status: AC
  Administered 2023-07-11: 3 g via ORAL
  Filled 2023-07-11: qty 3

## 2023-07-11 MED ORDER — OMEPRAZOLE MAGNESIUM 20 MG PO TBEC: 20.0000 mg | DELAYED_RELEASE_TABLET | Freq: Every day | ORAL | Status: DC

## 2023-07-11 MED ORDER — SODIUM CHLORIDE 0.9 % IV SOLN
1.0000 g | INTRAVENOUS | Status: DC
  Administered 2023-07-12 – 2023-07-13 (×2): 1 g via INTRAVENOUS
  Filled 2023-07-11 (×3): qty 10

## 2023-07-11 MED ORDER — ISOSORBIDE MONONITRATE ER 30 MG PO TB24
30.0000 mg | ORAL_TABLET | Freq: Every day | ORAL | Status: DC
  Administered 2023-07-11 – 2023-07-12 (×2): 30 mg via ORAL
  Filled 2023-07-11 (×3): qty 1

## 2023-07-11 MED ORDER — CARVEDILOL 6.25 MG PO TABS
6.2500 mg | ORAL_TABLET | Freq: Two times a day (BID) | ORAL | Status: DC
  Administered 2023-07-11 – 2023-07-12 (×2): 6.25 mg via ORAL
  Filled 2023-07-11 (×2): qty 1

## 2023-07-11 MED ORDER — DIVALPROEX SODIUM 125 MG PO DR TAB
125.0000 mg | DELAYED_RELEASE_TABLET | Freq: Two times a day (BID) | ORAL | Status: DC
  Administered 2023-07-11 – 2023-07-12 (×2): 125 mg via ORAL
  Filled 2023-07-11 (×2): qty 1

## 2023-07-11 MED ORDER — ONDANSETRON HCL 4 MG/2ML IJ SOLN: 4.0000 mg | Freq: Four times a day (QID) | INTRAMUSCULAR | Status: DC | PRN

## 2023-07-11 MED ORDER — HYDRALAZINE HCL 50 MG PO TABS
50.0000 mg | ORAL_TABLET | Freq: Two times a day (BID) | ORAL | Status: DC
  Administered 2023-07-11 – 2023-07-12 (×2): 50 mg via ORAL
  Filled 2023-07-11 (×2): qty 1

## 2023-07-11 MED ORDER — ESCITALOPRAM OXALATE 10 MG PO TABS
10.0000 mg | ORAL_TABLET | Freq: Every day | ORAL | Status: DC
  Administered 2023-07-11 – 2023-07-12 (×2): 10 mg via ORAL
  Filled 2023-07-11 (×2): qty 1

## 2023-07-11 MED ORDER — L-LYSINE 1000 MG PO TABS: 1000.0000 mg | ORAL_TABLET | Freq: Every day | ORAL | Status: DC

## 2023-07-11 MED ORDER — SIMVASTATIN 40 MG PO TABS
40.0000 mg | ORAL_TABLET | Freq: Every day | ORAL | Status: DC
  Administered 2023-07-11: 40 mg via ORAL
  Filled 2023-07-11: qty 1

## 2023-07-11 NOTE — ED Notes (Signed)
 Patient is resting comfortably.

## 2023-07-11 NOTE — ED Notes (Signed)
 Hooked to monitor, in room in nad

## 2023-07-11 NOTE — ED Provider Notes (Signed)
  Physical Exam  BP (!) 185/97   Pulse 69   Temp 99.6 F (37.6 C) (Rectal)   Resp 18   SpO2 97%   Physical Exam  Procedures  Procedures  ED Course / MDM    Medical Decision Making Amount and/or Complexity of Data Reviewed Labs: ordered. Radiology: ordered.  Risk Prescription drug management. Decision regarding hospitalization.   Received in signout.  Pending blood work for admission.  Had discussed with GI who will admit for upper GI tomorrow.Will discuss with hospitalist       [name] Arias, MD 07/11/23 [phone]

## 2023-07-11 NOTE — Progress Notes (Signed)
 Pt refused to wear CPAP. States does not wear at home and does not want to wear here. Pt is currently on Room Air and is tolerating well. No distress noted.  Will continue to monitor pt.

## 2023-07-11 NOTE — H&P (Signed)
 History and Physical  [name] [name] ZOX:[id number] DOB: 02/13/1930 DOA: 07/11/2023  PCP: Jeannine Milroy., MD   Chief Complaint: Difficulty swallowing  HPI: [name] [name] is a 89 y.o. female with medical history significant for GERD, achalasia, dementia, HTN, HLD, chronic diastolic HF, OSA, COPD, CKD stage IV, DVT on Eliquis , T2DM and allergies who presents to the ED for evaluation of difficulty swallowing.  Patient with dementia and oriented only to self. Per daughter, patient was found to have UTI and was prescribed Keflex on Monday. On Tuesday, she reported difficulty swallowing and did not eat her dinner. She described a sensation of food getting stuck in her throat and 1 episode of emesis of undigested food content.  She had associated abdominal discomfort and belching.  On Wednesday, daughter discussed patient's symptoms with her GI doctor who recommended hospital admission for further evaluation however patient refused.  She has had decreased p.o. intake over the last few days and has not eaten or taken any of her medications for the last 2 days. They have presented today for further evaluation of her difficulty swallowing.  Patient has had a history of dysphagia, requiring esophageal manometry in May 2024. Reportedly had esophageal dilation back in the 80s.  ED Course: Initial vitals shows temp 99.6, RR 18, HR 80, BP 185/97, SpO2 95% on room air.  Labs significant for normal CBC, creatinine 1.82 (from 2.14 a year ago), lipase 21.  CT renal stone study showed moderate to severe diffuse bladder wall thickening consistent with cystitis but no urolithiasis or hydronephrosis.  EDP attempted to get volume swallow done however adequate staffing were not available for this testing.  TRH consulted for admission with plan for barium swallow study tomorrow.  Review of Systems: Please see HPI for pertinent positives and negatives. A complete 10 system review of systems are otherwise negative.  Past  Medical History:  Diagnosis Date   Allergic rhinitis, cause unspecified    Asthma    CKD (chronic kidney disease) stage 4, GFR 15-29 ml/min (HCC) 01/20/2021   Esophageal reflux    Sleep apnea    on C-pap- Dr young follows   Syncope 2019   Normal LVF, negative monitor   Type II or unspecified type diabetes mellitus without mention of complication, not stated as uncontrolled    Unspecified essential hypertension    mild LVH, normal LVF, grade 2 DD by echo 2019   Past Surgical History:  Procedure Laterality Date   APPENDECTOMY     ESOPHAGEAL MANOMETRY N/A 07/11/2022   Procedure: ESOPHAGEAL MANOMETRY (EM);  Surgeon: Baldo Bonds, MD;  Location: [facility] ENDOSCOPY;  Service: Gastroenterology;  Laterality: N/A;   ESOPHAGOGASTRODUODENOSCOPY (EGD) WITH PROPOFOL  N/A 01/29/2021   Procedure: ESOPHAGOGASTRODUODENOSCOPY (EGD) WITH PROPOFOL ;  Surgeon: Genell Ken, MD;  Location: [facility] ENDOSCOPY;  Service: Gastroenterology;  Laterality: N/A;   ESOPHAGOGASTRODUODENOSCOPY (EGD) WITH PROPOFOL  N/A 04/27/2022   Procedure: ESOPHAGOGASTRODUODENOSCOPY (EGD) WITH PROPOFOL ;  Surgeon: Evangeline Hilts, MD;  Location: [facility] ENDOSCOPY;  Service: Gastroenterology;  Laterality: N/A;   FIXATION KYPHOPLASTY LUMBAR SPINE     FOREIGN BODY REMOVAL  01/29/2021   Procedure: FOREIGN BODY REMOVAL;  Surgeon: Genell Ken, MD;  Location: [facility] ENDOSCOPY;  Service: Gastroenterology;;   FOREIGN BODY REMOVAL  04/27/2022   Procedure: FOREIGN BODY REMOVAL;  Surgeon: Evangeline Hilts, MD;  Location: [facility] ENDOSCOPY;  Service: Gastroenterology;;   HIATAL HERNIA REPAIR     INTRAMEDULLARY (IM) NAIL INTERTROCHANTERIC Left 01/21/2021   Procedure: INTRAMEDULLARY (IM) NAIL INTERTROCHANTRIC;  Surgeon: Wes Hamman,  MD;  Location: [facility] OR;  Service: Orthopedics;  Laterality: Left;   LUMBAR SPINE SURGERY     TUBAL LIGATION     VAGINAL HYSTERECTOMY     Social History:  reports that she has never smoked. She has never used smokeless tobacco. She reports that  she does not drink alcohol and does not use drugs.  Allergies  Allergen Reactions   Aspirin Nausea And Vomiting and Nausea Only   Codeine Nausea And Vomiting and Nausea Only   Ibuprofen Nausea And Vomiting   Amoxicillin-Pot Clavulanate Other (See Comments) and Nausea Only    GI upset   Aricept [Donepezil] Other (See Comments)    Gi upset    Meperidine Hcl Nausea And Vomiting   Namenda [Memantine] Other (See Comments)    Gi upset    Tape Other (See Comments)    ADHESIVE TAPE CAUSES BLISTERS    Family History  Problem Relation Age of Onset   Lung cancer Brother    Diabetes Mother      Prior to Admission medications   Medication Sig Start Date End Date Taking? Authorizing Provider  acetaminophen  (TYLENOL ) 500 MG tablet Take 1,000 mg by mouth in the morning and at bedtime.   Yes [provider]  albuterol  (PROAIR  HFA) 108 (90 Base) MCG/ACT inhaler 2 puffs every 4-6 hours as directed- rescue Patient taking differently: Inhale 2 puffs into the lungs every 6 (six) hours as needed for wheezing or shortness of breath. 02/17/21  Yes Medina-Vargas, Monina C, NP  carvedilol  (COREG ) 6.25 MG tablet Take 6.25 mg by mouth 2 (two) times daily with a meal.   Yes [provider]  cephALEXin (KEFLEX) 500 MG capsule Take 500 mg by mouth 4 (four) times daily. 07/08/23 07/15/23 Yes [provider]  divalproex  (DEPAKOTE ) 125 MG DR tablet Take 125 mg by mouth 2 (two) times daily.   Yes [provider]  ELIQUIS  2.5 MG TABS tablet Take 1 tablet (2.5 mg total) by mouth 2 (two) times daily. 02/17/21  Yes Medina-Vargas, Monina C, NP  escitalopram  (LEXAPRO ) 10 MG tablet Take 10 mg by mouth daily.   Yes [provider]  furosemide  (LASIX ) 40 MG tablet Take 1 tablet (40 mg total) by mouth daily. 02/19/22  Yes Deforest Fast, MD  hydrALAZINE  (APRESOLINE ) 50 MG tablet Take 1 tablet (50 mg total) by mouth 3 (three) times daily. Patient taking differently: Take 50 mg by mouth  in the morning and at bedtime. 02/19/22  Yes Deforest Fast, MD  isosorbide  mononitrate (IMDUR ) 30 MG 24 hr tablet Take 1 tablet (30 mg total) by mouth daily. 02/19/22  Yes Deforest Fast, MD  L-Lysine  1000 MG TABS Take 1,000 mg by mouth daily.   Yes [provider]  loratadine  (CLARITIN ) 10 MG tablet Take 10 mg by mouth daily as needed for allergies.   Yes [provider]  omeprazole  (PRILOSEC  OTC) 20 MG tablet Take 20 mg by mouth daily before breakfast.   Yes [provider]  simvastatin  (ZOCOR ) 40 MG tablet Take 1 tablet (40 mg total) by mouth every evening. Patient taking differently: Take 40 mg by mouth at bedtime. 02/17/21  Yes Medina-Vargas, Monina C, NP  TOUJEO  SOLOSTAR 300 UNIT/ML Solostar Pen Inject 20 Units into the skin at bedtime. 01/03/22  Yes [provider]    Physical Exam: BP 124/74   Pulse 83   Temp 98.3 F (36.8 C) (Oral)   Resp 18   SpO2 95%  General: Tired appearing elderly  woman laying in bed. No acute distress. HEENT: [place]/AT. Anicteric sclera.  Dry mucous membrane CV: RRR. No murmurs, rubs, or gallops. No LE edema Pulmonary: Lungs CTAB. Normal effort. No wheezing or rales. Abdominal: Soft, nontender, nondistended. Normal bowel sounds. Extremities: Palpable radial and DP pulses. Normal ROM. Skin: Warm and dry. No obvious rash or lesions. Decree skin turgor. Neuro: Alert and oriented to self only.  Moves all extremities. Normal sensation to light touch. No focal deficit.          Labs on Admission:  Basic Metabolic Panel: Recent Labs  Lab 07/11/23 1514 07/11/23 1526  NA 139 140  K 4.0 4.0  CL 106 108  CO2 20*  --   GLUCOSE 116* 110*  BUN 31* 28*  CREATININE 1.82* 1.90*  CALCIUM 10.6*  --    Liver Function Tests: Recent Labs  Lab 07/11/23 1514  AST 19  ALT 10  ALKPHOS 44  BILITOT 1.1  PROT 7.1  ALBUMIN 3.9   Recent Labs  Lab 07/11/23 1514  LIPASE 21   No results for input(s): "AMMONIA" in the last 168  hours. CBC: Recent Labs  Lab 07/11/23 1514 07/11/23 1526  WBC 8.4  --   NEUTROABS 5.3  --   HGB 12.9 13.3  HCT 39.5 39.0  MCV 93.4  --   PLT 108*  --    Cardiac Enzymes: No results for input(s): "CKTOTAL", "CKMB", "CKMBINDEX", "TROPONINI" in the last 168 hours. BNP (last 3 results) No results for input(s): "BNP" in the last 8760 hours.  ProBNP (last 3 results) No results for input(s): "PROBNP" in the last 8760 hours.  CBG: No results for input(s): "GLUCAP" in the last 168 hours.  Radiological Exams on Admission: CT Renal Stone Study Result Date: 07/11/2023 CLINICAL DATA:  Abdominal and flank pain. EXAM: CT ABDOMEN AND PELVIS WITHOUT CONTRAST TECHNIQUE: Multidetector CT imaging of the abdomen and pelvis was performed following the standard protocol without IV contrast. RADIATION DOSE REDUCTION: This exam was performed according to the departmental dose-optimization program which includes automated exposure control, adjustment of the mA and/or kV according to patient size and/or use of iterative reconstruction technique. COMPARISON:  01/28/2021 FINDINGS: Lower chest: No acute findings. Hepatobiliary: No mass visualized on this unenhanced exam. Gallbladder is unremarkable. No evidence of biliary ductal dilatation. Pancreas: Diffuse pancreatic atrophy again noted. No mass or inflammatory process visualized on this unenhanced exam. Spleen:  Within normal limits in size. Adrenals/Urinary tract: No evidence of urolithiasis or hydronephrosis. Several high attenuation proteinaceous left renal cysts show no significant change. Moderate to severe diffuse bladder wall thickening is new since previous study, and consistent with cystitis. Stomach/Bowel: Numerous surgical clips are again seen in the region of the GE junction and proximal stomach. No evidence of obstruction, inflammatory process, or abnormal fluid collections. Vascular/Lymphatic: No pathologically enlarged lymph nodes identified. No  evidence of abdominal aortic aneurysm. Reproductive: Prior hysterectomy noted. Adnexal regions are unremarkable in appearance. Other:  None. Musculoskeletal: No suspicious bone lesions identified. Lumbar spine fusion hardware and left hip internal fixation hardware again noted. IMPRESSION: Moderate to severe diffuse bladder wall thickening, consistent with cystitis. Suggest correlation with urinalysis. No evidence of urolithiasis or hydronephrosis. Electronically Signed   By: Marlyce Sine M.D.   On: 07/11/2023 17:01   Assessment/Plan KIALA RIDDLES is a 89 y.o. female with medical history significant for GERD, achalasia, dementia, HTN, HLD, chronic diastolic HF, OSA, COPD, CKD stage IV, DVT on Eliquis , T2DM and allergies who presents to the  ED for evaluation of difficulty swallowing and admitted for dysphagia.  # Dysphagia - Patient with history of dementia, documented achalasia and GERD presented with difficulty swallowing - Patient dry on exam in the setting of recent decreased p.o. intake - Pending barium swallow study tomorrow - Keep n.p.o. except sips with meds - IV NS 75 cc/h for 10 hours - SLP consulted, appreciate recs  # Acute cystitis - Patient diagnosed with UTI in the outpatient on Monday - CT renal stone study shows evidence of cystitis - Unable to treat in the outpatient due to inability to swallow Keflex - Status post fosfomycin packet 3 g x1 in the ED - IV Rocephin  while inpatient - Follow-up repeat urinalysis  # CKD stage IV - Creatinine of 1.82, down from 2.14 1 year ago. - Continue IV hydration as above  # T2DM - Blood sugar of 116 on CMP - SSI with every 4 hours CBG monitoring - On Toujeo  20 units at bedtime, hold for now  # Chronic diastolic HF # Hypertension - BP elevated with SBP in the 150s to 180s in the setting of patient unable to take BP meds over the last 2 days - Patient dry on exam in the setting of recent decreased p.o. intake - Status post 2 L IV  LR boluses - Continue home hydralazine , Coreg  and Imdur   # COPD - Stable - As needed albuterol  nebs  # OSA - CPAP at bedtime  #Hx of DVT - Continue low-dose Eliquis   # Dementia - Continue Depakote  and Lexapro   # HLD - Continue simvastatin   # GERD - Continue Protonix   DVT prophylaxis: Eliquis     Code Status: Limited: Do not attempt resuscitation (DNR) -DNR-LIMITED -Do Not Intubate/DNI   Consults called: None  Family Communication: Discussed admission with daughter at bedside  Severity of Illness: The appropriate patient status for this patient is OBSERVATION. Observation status is judged to be reasonable and necessary in order to provide the required intensity of service to ensure the patient's safety. The patient's presenting symptoms, physical exam findings, and initial radiographic and laboratory data in the context of their medical condition is felt to place them at decreased risk for further clinical deterioration. Furthermore, it is anticipated that the patient will be medically stable for discharge from the hospital within 2 midnights of admission.   Level of care: Med-Surg   This record has been created using Conservation officer, historic buildings. Errors have been sought and corrected, but may not always be located. Such creation errors do not reflect on the standard of care.   [name] Grip, MD 07/11/2023, 6:56 PM [facility] Pager: [phone] Isaiah 41:10   If 7PM-7AM, please contact night-coverage [email or web address] Password [email or web address]

## 2023-07-11 NOTE — ED Notes (Signed)
 Pt consumed oral medication without complication, no aspiration noted.

## 2023-07-11 NOTE — ED Provider Notes (Addendum)
  EMERGENCY DEPARTMENT AT [facility] Provider Note   CSN: [id number] Arrival date & time: 07/11/23  1040     History  Chief Complaint  Patient presents with   Dysphagia    Pt arrives via EMS today for worsening esophageal acolasia. Pt has known UTI and has not taken atbx since Monday, no morning meds today dt diff swallowing. Has hx of scopes. Denies pain     [name] [name] is a 89 y.o. female.  89 yo F with a chief complaints of difficulty swallowing.  The patient says that she has not sure what is actually going on.  She says she gets difficulty swallowing at times.  She was told that she needed to come here for evaluation.  She denies pain anywhere.  She says that she does have urinary symptoms off and on but does not think she has them currently.        Home Medications Prior to Admission medications   Medication Sig Start Date End Date Taking? Authorizing Provider  acetaminophen  (TYLENOL ) 500 MG tablet Take 1,000 mg by mouth in the morning and at bedtime.    [provider]  albuterol  (PROAIR  HFA) 108 (90 Base) MCG/ACT inhaler 2 puffs every 4-6 hours as directed- rescue Patient taking differently: Inhale 2 puffs into the lungs as needed for wheezing or shortness of breath. 02/17/21   Medina-Vargas, Monina C, NP  carvedilol  (COREG ) 6.25 MG tablet Take 6.25 mg by mouth 2 (two) times daily with a meal.    [provider]  ELIQUIS  2.5 MG TABS tablet Take 1 tablet (2.5 mg total) by mouth 2 (two) times daily. 02/17/21   Medina-Vargas, Monina C, NP  furosemide  (LASIX ) 40 MG tablet Take 1 tablet (40 mg total) by mouth daily. 02/19/22   Deforest Fast, MD  hydrALAZINE  (APRESOLINE ) 50 MG tablet Take 1 tablet (50 mg total) by mouth 3 (three) times daily. Patient taking differently: Take 50 mg by mouth in the morning and at bedtime. 02/19/22   Deforest Fast, MD  isosorbide  mononitrate (IMDUR ) 30 MG 24 hr tablet Take 1 tablet (30 mg total) by mouth  daily. 02/19/22   Deforest Fast, MD  loratadine  (CLARITIN ) 10 MG tablet Take 10 mg by mouth daily as needed for allergies.    [provider]  LYSINE  PO Take 1,000 mg by mouth daily.    [provider]  omeprazole  (PRILOSEC ) 20 MG capsule Take 20 mg by mouth daily.    [provider]  PRESCRIPTION MEDICATION Inhale 1 Dose into the lungs as needed (wheezing/SOB). Albuterol  Nebulizer solution    [provider]  simvastatin  (ZOCOR ) 40 MG tablet Take 1 tablet (40 mg total) by mouth every evening. Patient taking differently: Take 40 mg by mouth at bedtime. 02/17/21   Medina-Vargas, Monina C, NP  TOUJEO  SOLOSTAR 300 UNIT/ML Solostar Pen Inject 20 Units into the skin at bedtime. 01/03/22   [provider]      Allergies    Aspirin, Codeine, Ibuprofen, Aricept [donepezil], Meperidine hcl, Namenda [memantine], Tape, and Augmentin [amoxicillin-pot clavulanate]    Review of Systems   Review of Systems  Physical Exam Updated Vital Signs BP (!) 185/97   Pulse 69   Temp 99.6 F (37.6 C) (Rectal)   Resp 18   SpO2 97%  Physical Exam Vitals and nursing note reviewed.  Constitutional:      General: She is not in acute distress.    Appearance: She is well-developed. She is  not diaphoretic.  HENT:     Head: Normocephalic and atraumatic.  Eyes:     Pupils: Pupils are equal, round, and reactive to light.  Cardiovascular:     Rate and Rhythm: Normal rate and regular rhythm.     Heart sounds: No murmur heard.    No friction rub. No gallop.  Pulmonary:     Effort: Pulmonary effort is normal.     Breath sounds: No wheezing or rales.  Abdominal:     General: There is no distension.     Palpations: Abdomen is soft.     Tenderness: There is no abdominal tenderness.  Musculoskeletal:        General: No tenderness.     Cervical back: Normal range of motion and neck supple.  Skin:    General: Skin is warm and dry.  Neurological:     Mental Status: She  is alert and oriented to person, place, and time.  Psychiatric:        Behavior: Behavior normal.     ED Results / Procedures / Treatments   Labs (all labs ordered are listed, but only abnormal results are displayed) Labs Reviewed - No data to display  EKG None  Radiology No results found.  Procedures Procedures    Medications Ordered in ED Medications  fosfomycin (MONUROL ) packet 3 g (3 g Oral Given 07/11/23 1141)    ED Course/ Medical Decision Making/ A&P                                 Medical Decision Making Amount and/or Complexity of Data Reviewed Labs: ordered. Radiology: ordered.  Risk Prescription drug management.   89 yo F with a chief complaints of difficulty swallowing.  This was reported by EMS.  The patient is not sure of any difficulty.  She tells me that she does have trouble swallowing off and on.  She is currently being treated for a urinary tract infection.  Reportedly cannot keep down her antibiotics.  Will attempt an oral trial here.  Dose of fosfomycin.  Reassess.  Patient is able to tolerate by mouth here without issue.  No nausea.  I feel her dose of fosfomycin should cover her antibiotic wise for her recent urinary tract infection.  I discussed further observation and further workup with the patient.  She would prefer to go home at this time.   1:12 PM Family was unable to be in the ED for discussion.  They are worried that the patient isn't doing well at home, requesting labs and CT imaging.    I discussed the case with [facility], Dr. Feliberto Hopping, recommends barium swallow.   I discussed the case with the radiologist on-call, unfortunately due to staffing would be difficult for him to get to this case.  He did offer to do it if it was concern for acute perforation otherwise recommended it be done in a less emergent basis.  I discussed this with Dr. Feliberto Hopping, [facility].  Recommends admission.   Signed out to Dr. Val Garin, please see their note in  further details of care in the ED.    Medications given during this visit Medications  fosfomycin (MONUROL ) packet 3 g (3 g Oral Given 07/11/23 1141)     The patient appears reasonably screen and/or stabilized for discharge and I doubt any other medical condition or other [facility] requiring further screening, evaluation, or treatment in the ED at  this time prior to discharge.          Final Clinical Impression(s) / ED Diagnoses Final diagnoses:  Dysphagia, unspecified type    Rx / DC Orders ED Discharge Orders     None         Albertus Hughs, DO 07/11/23 1514    Albertus Hughs, DO 07/11/23 1515

## 2023-07-11 NOTE — Discharge Instructions (Signed)
 Follow-up with your family doctor and gastroenterologist in the office.  Please return for worsening symptoms inability to swallow.

## 2023-07-12 ENCOUNTER — Encounter (HOSPITAL_COMMUNITY): Payer: Self-pay | Admitting: Student

## 2023-07-12 ENCOUNTER — Observation Stay (HOSPITAL_COMMUNITY)

## 2023-07-12 DIAGNOSIS — D696 Thrombocytopenia, unspecified: Secondary | ICD-10-CM | POA: Insufficient documentation

## 2023-07-12 DIAGNOSIS — N3000 Acute cystitis without hematuria: Secondary | ICD-10-CM | POA: Insufficient documentation

## 2023-07-12 DIAGNOSIS — R1310 Dysphagia, unspecified: Secondary | ICD-10-CM | POA: Diagnosis not present

## 2023-07-12 HISTORY — DX: Acute cystitis without hematuria: N30.00

## 2023-07-12 LAB — GLUCOSE, CAPILLARY
Glucose-Capillary: 102 mg/dL — ABNORMAL HIGH (ref 70–99)
Glucose-Capillary: 106 mg/dL — ABNORMAL HIGH (ref 70–99)
Glucose-Capillary: 120 mg/dL — ABNORMAL HIGH (ref 70–99)
Glucose-Capillary: 130 mg/dL — ABNORMAL HIGH (ref 70–99)
Glucose-Capillary: 265 mg/dL — ABNORMAL HIGH (ref 70–99)
Glucose-Capillary: 66 mg/dL — ABNORMAL LOW (ref 70–99)
Glucose-Capillary: 85 mg/dL (ref 70–99)
Glucose-Capillary: 87 mg/dL (ref 70–99)

## 2023-07-12 LAB — CBC
HCT: 35 % — ABNORMAL LOW (ref 36.0–46.0)
Hemoglobin: 10.6 g/dL — ABNORMAL LOW (ref 12.0–15.0)
MCH: 30.2 pg (ref 26.0–34.0)
MCHC: 30.3 g/dL (ref 30.0–36.0)
MCV: 99.7 fL (ref 80.0–100.0)
Platelets: 90 10*3/uL — ABNORMAL LOW (ref 150–400)
RBC: 3.51 MIL/uL — ABNORMAL LOW (ref 3.87–5.11)
RDW: 14.2 % (ref 11.5–15.5)
WBC: 6.9 10*3/uL (ref 4.0–10.5)
nRBC: 0 % (ref 0.0–0.2)

## 2023-07-12 LAB — HEMOGLOBIN A1C
Hgb A1c MFr Bld: 6.3 % — ABNORMAL HIGH (ref 4.8–5.6)
Mean Plasma Glucose: 134.11 mg/dL

## 2023-07-12 LAB — BASIC METABOLIC PANEL WITH GFR
Anion gap: 9 (ref 5–15)
BUN: 31 mg/dL — ABNORMAL HIGH (ref 8–23)
CO2: 21 mmol/L — ABNORMAL LOW (ref 22–32)
Calcium: 9.7 mg/dL (ref 8.9–10.3)
Chloride: 110 mmol/L (ref 98–111)
Creatinine, Ser: 1.97 mg/dL — ABNORMAL HIGH (ref 0.44–1.00)
GFR, Estimated: 24 mL/min — ABNORMAL LOW (ref >60)
Glucose, Bld: 84 mg/dL (ref 70–99)
Potassium: 3.8 mmol/L (ref 3.5–5.1)
Sodium: 140 mmol/L (ref 135–145)

## 2023-07-12 MED ORDER — FAMOTIDINE IN NACL 20-0.9 MG/50ML-% IV SOLN: 20.0000 mg | Freq: Two times a day (BID) | INTRAVENOUS | Status: DC | PRN

## 2023-07-12 MED ORDER — LABETALOL HCL 5 MG/ML IV SOLN: 10.0000 mg | Freq: Four times a day (QID) | INTRAVENOUS | Status: DC | PRN

## 2023-07-12 MED ORDER — HYDRALAZINE HCL 20 MG/ML IJ SOLN
10.0000 mg | Freq: Four times a day (QID) | INTRAMUSCULAR | Status: DC | PRN
  Administered 2023-07-14: 10 mg via INTRAVENOUS
  Filled 2023-07-12: qty 1

## 2023-07-12 NOTE — Progress Notes (Addendum)
 SLP Cancellation Note  Patient Details Name: [name] [name] MRN: [id number] DOB: 10-Sep-1929   Cancelled treatment:       Reason Eval/Treat Not Completed: Other (comment). Await result of esophagram to evaluate pt. Will check in this pm.   Addendum 11:12 - No pharyngeal aspiration on esophagram. Plan is for EGD tomorrow. Esophageal dysphagia managed by GI. Will sign off at this time.    Armanii Urbanik, Hardin Leys 07/12/2023, 8:53 AM

## 2023-07-12 NOTE — Assessment & Plan Note (Signed)
 A1c 6.3%, well-controlled - Hold Toujeo  while n.p.o. - Continue sliding scale correction

## 2023-07-12 NOTE — Progress Notes (Signed)
  Progress Note   Patient: [name] [name] ZOX:[id number] DOB: June 02, 1929 DOA: 07/11/2023     0 DOS: the patient was seen and examined on 07/12/2023 at 11:12 AM      Brief hospital course: 89 y.o. 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     Assessment and Plan: * Dysphagia Longstanding history of esophageal dysmotility, recurrent impactions requiring endoscopic treatment.    Admitted this time and barium esophagram show extensive dysmotility, stricture, and impingement of barium tablet.  GI consulted, planning for endoscopy. - Consult GI appreciate cares - Hold oral meds    Chronic diastolic CHF (congestive heart failure) (HCC) Appears euvolemic - Hold Lasix   CKD (chronic kidney disease) stage 4, GFR 15-29 ml/min (HCC) Creatinine stable relative to baseline, 1.7-2.1  Type 2 diabetes mellitus with hyperlipidemia (HCC) A1c 6.3%, well-controlled - Hold Toujeo  while n.p.o. - Continue sliding scale correction  Essential hypertension Blood pressure controlled - Hold carvedilol , furosemide , hydralazine , Imdur  -As needed hydralazine  and labetalol  for high range pressures  OBESITY BMI 34, complicates care, class II obesity  History of DVT (deep vein thrombosis) - Resume Eliquis  after EGD tomorrow  Dementia (HCC) Mild - Resume Depakote  and Lexapro  after EGD tomorrow  Thrombocytopenia (HCC) Mild, appears to be trending down over months.  ?NASH liver disease - Outpatient follow-up  Acute cystitis - Continue Rocephin   COPD (chronic obstructive pulmonary disease) (HCC) No wheezing, no active disease - Continue albuterol  as needed  Hyperlipidemia - Resume simvastatin  after EGD tomorrow  Obstructive sleep apnea Noncompliant with CPAP          Subjective: Patient has no complaints.  She is resting in bed.  She has had  no vomiting, no abdominal pain, no chest pain, no dyspnea.     Physical Exam: BP (!) 125/55 (BP Location: Left Arm)   Pulse 73   Temp 97.8 F (36.6 C) (Oral)   Resp 15   SpO2 97%   Obese adult female, lying in bed, interactive and appropriate RRR, no murmurs, no peripheral edema Respiratory rate normal, lungs clear without rales or wheezes Abdomen soft no tenderness palpation or guarding, no ascites or distention   Data Reviewed: Basic metabolic panel shows creatinine 1.97, no change Platelets down to 90,000, overall stable Hemoglobin 10.6, no significant change Sodium and potassium normal Esophagram shows impaction of barium tablet  Family Communication:     Disposition: Status is: Observation         Author: Ephriam Hashimoto, MD 07/12/2023 1:52 PM  For on call review [email or web address].

## 2023-07-12 NOTE — Assessment & Plan Note (Signed)
 No wheezing, no active disease - Continue albuterol  as needed

## 2023-07-12 NOTE — Plan of Care (Signed)
  Problem: Skin Integrity: Goal: Risk for impaired skin integrity will decrease Outcome: Progressing   Problem: Health Behavior/Discharge Planning: Goal: Ability to manage health-related needs will improve Outcome: Progressing   Problem: Clinical Measurements: Goal: Diagnostic test results will improve Outcome: Progressing   Problem: Nutrition: Goal: Adequate nutrition will be maintained Outcome: Progressing   Problem: Elimination: Goal: Will not experience complications related to bowel motility Outcome: Progressing Goal: Will not experience complications related to urinary retention Outcome: Progressing   Problem: Safety: Goal: Ability to remain free from injury will improve Outcome: Progressing   Problem: Skin Integrity: Goal: Risk for impaired skin integrity will decrease Outcome: Progressing

## 2023-07-12 NOTE — Assessment & Plan Note (Signed)
 Noncompliant with CPAP

## 2023-07-12 NOTE — Assessment & Plan Note (Signed)
-   Resume simvastatin  after EGD tomorrow

## 2023-07-12 NOTE — Assessment & Plan Note (Signed)
 Mild, appears to be trending down over months.  ?NASH liver disease - Outpatient follow-up

## 2023-07-12 NOTE — Progress Notes (Signed)
 Mobility Specialist - Progress Note   07/12/23 1300  Mobility  Activity Ambulated with assistance in hallway;Ambulated with assistance to bathroom  Level of Assistance Standby assist, set-up cues, supervision of patient - no hands on  Assistive Device Front wheel walker  Distance Ambulated (ft) 230 ft  Activity Response Tolerated well  Mobility Referral Yes  Mobility visit 1 Mobility  Mobility Specialist Start Time (ACUTE ONLY) 1243  Mobility Specialist Stop Time (ACUTE ONLY) 1300  Mobility Specialist Time Calculation (min) (ACUTE ONLY) 17 min   Pt received in bed and agreeable to mobility. Prior to ambulating, pt requested assistance to bathroom for BM. No complaints during session. Pt to bathroom for wash-up after session with all needs met. NT in room.   [facility]

## 2023-07-12 NOTE — Anesthesia Preprocedure Evaluation (Signed)
 Anesthesia Evaluation  Patient identified by MRN, date of birth, ID band Patient awake    Reviewed: Allergy  & Precautions, NPO status , Patient's Chart, lab work & pertinent test results  Airway Mallampati: II  TM Distance: >3 FB Neck ROM: Full    Dental  (+) Upper Dentures, Lower Dentures   Pulmonary asthma , sleep apnea and Continuous Positive Airway Pressure Ventilation , COPD   Pulmonary exam normal        Cardiovascular hypertension, Pt. on home beta blockers and Pt. on medications +CHF   Rhythm:Regular Rate:Normal  Echo:  1. Left ventricular ejection fraction, by estimation, is 70 to 75%. The  left ventricle has hyperdynamic function. The left ventricle has no  regional wall motion abnormalities. Left ventricular diastolic parameters  are consistent with Grade II diastolic  dysfunction (pseudonormalization).   2. Right ventricular systolic function is normal. The right ventricular  size is normal. There is mildly elevated pulmonary artery systolic  pressure. The estimated right ventricular systolic pressure is 41.6 mmHg.   3. The mitral valve is degenerative. No evidence of mitral valve  regurgitation. No evidence of mitral stenosis. Moderate mitral annular  calcification.   4. The aortic valve is tricuspid. There is mild calcification of the  aortic valve. Aortic valve regurgitation is not visualized. Aortic valve  sclerosis/calcification is present, without any evidence of aortic  stenosis.   5. The inferior vena cava is dilated in size with >50% respiratory  variability, suggesting right atrial pressure of 8 mmHg.      Neuro/Psych  PSYCHIATRIC DISORDERS     Dementia negative neurological ROS     GI/Hepatic Neg liver ROS,GERD  Medicated,,Food impaction   Endo/Other  diabetes, Type 2    Renal/GU CRFRenal disease  negative genitourinary   Musculoskeletal negative musculoskeletal ROS (+)    Abdominal  (+)  + obese  Peds  Hematology negative hematology ROS (+)   Anesthesia Other Findings   Reproductive/Obstetrics                             Anesthesia Physical Anesthesia Plan  ASA: 3  Anesthesia Plan: MAC   Post-op Pain Management: Minimal or no pain anticipated   Induction: Intravenous  PONV Risk Score and Plan: 3 and Propofol  infusion  Airway Management Planned: Natural Airway  Additional Equipment: None  Intra-op Plan:   Post-operative Plan: Extubation in OR  Informed Consent: I have reviewed the patients History and Physical, chart, labs and discussed the procedure including the risks, benefits and alternatives for the proposed anesthesia with the patient or authorized representative who has indicated his/her understanding and acceptance.     Dental advisory given  Plan Discussed with: CRNA  Anesthesia Plan Comments:        Anesthesia Quick Evaluation

## 2023-07-12 NOTE — Care Management Obs Status (Signed)
 MEDICARE OBSERVATION STATUS NOTIFICATION   Patient Details  Name: [name] [name] MRN: [id number] Date of Birth: 12-Mar-1930   Medicare Observation Status Notification Given:  Yes    Chardonnay Holzmann A Quaran Kedzierski, LCSW 07/12/2023, 3:23 PM

## 2023-07-12 NOTE — Progress Notes (Signed)
 Pt is refusing out CPAP machine. Pt states she does not wear one at home and does not want to wear CPAP here. Pt is currently on Room Air and is tolerating well. No distress noted.  Will continue to monitor pt.  No CPAP machine in room at this time

## 2023-07-12 NOTE — Assessment & Plan Note (Signed)
 BMI 34, complicates care, class II obesity

## 2023-07-12 NOTE — Assessment & Plan Note (Signed)
 Blood pressure controlled - Hold carvedilol , furosemide , hydralazine , Imdur  -As needed hydralazine  and labetalol  for high range pressures

## 2023-07-12 NOTE — Consult Note (Signed)
 [facility] Gastroenterology Consult  Referring Provider: ER Primary Care Physician:  Jeannine Milroy., MD Primary Gastroenterologist: Dr. Clover Dao GI  Reason for Consultation: Dysphagia  HPI: [name] [name] is a 89 y.o. female with longstanding intermittent dysphagia requiring emergent EGD for esophageal food bolus impaction in the past was brought to the ER by her daughter with complaints of choking, suspected aspiration and progressively worsening dysphagia. Patient is pleasantly demented, awake but is unaware of why she is in the hospital. Most of the history is obtained from her daughter over the phone.   Previous GI workup: EGD 04/27/2022, Dr. Kimble Pennant: Food in entire esophagus, successful removal took 1 and a 1/2 hours, gastric diverticulum, small hiatal hernia  Esophageal manometry 07/2022, Dr. Honey Lusty: Esophageal dysmotility, likely age-related  EGD 01/29/2021, Dr. Feliberto Hopping: Food in entire esophagus, it took 1 hour to successfully remove esophageal food impaction    Past Medical History:  Diagnosis Date   Allergic rhinitis, cause unspecified    Asthma    CKD (chronic kidney disease) stage 4, GFR 15-29 ml/min (HCC) 01/20/2021   Esophageal reflux    Sleep apnea    on C-pap- Dr young follows   Syncope 2019   Normal LVF, negative monitor   Type II or unspecified type diabetes mellitus without mention of complication, not stated as uncontrolled    Unspecified essential hypertension    mild LVH, normal LVF, grade 2 DD by echo 2019    Past Surgical History:  Procedure Laterality Date   APPENDECTOMY     ESOPHAGEAL MANOMETRY N/A 07/11/2022   Procedure: ESOPHAGEAL MANOMETRY (EM);  Surgeon: Baldo Bonds, MD;  Location: [facility] ENDOSCOPY;  Service: Gastroenterology;  Laterality: N/A;   ESOPHAGOGASTRODUODENOSCOPY (EGD) WITH PROPOFOL  N/A 01/29/2021   Procedure: ESOPHAGOGASTRODUODENOSCOPY (EGD) WITH PROPOFOL ;  Surgeon: Genell Ken, MD;  Location: [facility] ENDOSCOPY;  Service:  Gastroenterology;  Laterality: N/A;   ESOPHAGOGASTRODUODENOSCOPY (EGD) WITH PROPOFOL  N/A 04/27/2022   Procedure: ESOPHAGOGASTRODUODENOSCOPY (EGD) WITH PROPOFOL ;  Surgeon: Evangeline Hilts, MD;  Location: [facility] ENDOSCOPY;  Service: Gastroenterology;  Laterality: N/A;   FIXATION KYPHOPLASTY LUMBAR SPINE     FOREIGN BODY REMOVAL  01/29/2021   Procedure: FOREIGN BODY REMOVAL;  Surgeon: Genell Ken, MD;  Location: [facility] ENDOSCOPY;  Service: Gastroenterology;;   FOREIGN BODY REMOVAL  04/27/2022   Procedure: FOREIGN BODY REMOVAL;  Surgeon: Evangeline Hilts, MD;  Location: [facility];  Service: Gastroenterology;;   HIATAL HERNIA REPAIR     INTRAMEDULLARY (IM) NAIL INTERTROCHANTERIC Left 01/21/2021   Procedure: INTRAMEDULLARY (IM) NAIL INTERTROCHANTRIC;  Surgeon: Wes Hamman, MD;  Location: [facility] OR;  Service: Orthopedics;  Laterality: Left;   LUMBAR SPINE SURGERY     TUBAL LIGATION     VAGINAL HYSTERECTOMY      Prior to Admission medications   Medication Sig Start Date End Date Taking? Authorizing Provider  acetaminophen  (TYLENOL ) 500 MG tablet Take 1,000 mg by mouth in the morning and at bedtime.   Yes [provider]  albuterol  (PROAIR  HFA) 108 (90 Base) MCG/ACT inhaler 2 puffs every 4-6 hours as directed- rescue Patient taking differently: Inhale 2 puffs into the lungs every 6 (six) hours as needed for wheezing or shortness of breath. 02/17/21  Yes Medina-Vargas, Monina C, NP  carvedilol  (COREG ) 6.25 MG tablet Take 6.25 mg by mouth 2 (two) times daily with a meal.   Yes [provider]  cephALEXin (KEFLEX) 500 MG capsule Take 500 mg by mouth 4 (four) times daily. 07/08/23 07/15/23 Yes [provider]  divalproex  (DEPAKOTE ) 125  MG DR tablet Take 125 mg by mouth 2 (two) times daily.   Yes [provider]  ELIQUIS  2.5 MG TABS tablet Take 1 tablet (2.5 mg total) by mouth 2 (two) times daily. 02/17/21  Yes Medina-Vargas, Monina C, NP  escitalopram  (LEXAPRO ) 10 MG tablet Take 10  mg by mouth daily.   Yes [provider]  furosemide  (LASIX ) 40 MG tablet Take 1 tablet (40 mg total) by mouth daily. 02/19/22  Yes Deforest Fast, MD  hydrALAZINE  (APRESOLINE ) 50 MG tablet Take 1 tablet (50 mg total) by mouth 3 (three) times daily. Patient taking differently: Take 50 mg by mouth in the morning and at bedtime. 02/19/22  Yes Deforest Fast, MD  isosorbide  mononitrate (IMDUR ) 30 MG 24 hr tablet Take 1 tablet (30 mg total) by mouth daily. 02/19/22  Yes Deforest Fast, MD  L-Lysine  1000 MG TABS Take 1,000 mg by mouth daily.   Yes [provider]  loratadine  (CLARITIN ) 10 MG tablet Take 10 mg by mouth daily as needed for allergies.   Yes [provider]  omeprazole  (PRILOSEC  OTC) 20 MG tablet Take 20 mg by mouth daily before breakfast.   Yes [provider]  simvastatin  (ZOCOR ) 40 MG tablet Take 1 tablet (40 mg total) by mouth every evening. Patient taking differently: Take 40 mg by mouth at bedtime. 02/17/21  Yes Medina-Vargas, Monina C, NP  TOUJEO  SOLOSTAR 300 UNIT/ML Solostar Pen Inject 20 Units into the skin at bedtime. 01/03/22  Yes [provider]    Current Facility-Administered Medications  Medication Dose Route Frequency Provider Last Rate Last Admin   acetaminophen  (TYLENOL ) tablet 650 mg  650 mg Oral Q6H PRN Amponsah, Prosper M, MD       Or   acetaminophen  (TYLENOL ) suppository 650 mg  650 mg Rectal Q6H PRN Amponsah, Prosper M, MD       albuterol  (PROVENTIL ) (2.5 MG/3ML) 0.083% nebulizer solution 2.5 mg  2.5 mg Nebulization Q6H PRN Vita Grip, MD       apixaban  (ELIQUIS ) tablet 2.5 mg  2.5 mg Oral BID Amponsah, Prosper M, MD   2.5 mg at 07/11/23 2129   carvedilol  (COREG ) tablet 6.25 mg  6.25 mg Oral BID WC Amponsah, Prosper M, MD   6.25 mg at 07/11/23 2129   cefTRIAXone  (ROCEPHIN ) 1 g in sodium chloride  0.9 % 100 mL IVPB  1 g Intravenous Q24H Vita Grip, MD       divalproex  (DEPAKOTE ) DR tablet 125 mg  125  mg Oral BID Amponsah, Prosper M, MD   125 mg at 07/11/23 2129   escitalopram  (LEXAPRO ) tablet 10 mg  10 mg Oral Daily Amponsah, Prosper M, MD   10 mg at 07/11/23 2129   hydrALAZINE  (APRESOLINE ) tablet 50 mg  50 mg Oral BID Amponsah, Prosper M, MD   50 mg at 07/11/23 2129   insulin  aspart (novoLOG ) injection 0-15 Units  0-15 Units Subcutaneous Q4H Vita Grip, MD   2 Units at 07/12/23 0000   isosorbide  mononitrate (IMDUR ) 24 hr tablet 30 mg  30 mg Oral Daily Amponsah, Prosper M, MD   30 mg at 07/11/23 2129   ondansetron  (ZOFRAN ) tablet 4 mg  4 mg Oral Q6H PRN Amponsah, Prosper M, MD       Or   ondansetron  (ZOFRAN ) injection 4 mg  4 mg Intravenous Q6H PRN Amponsah, Prosper M, MD       pantoprazole  (PROTONIX ) EC tablet 40 mg  40 mg Oral QAC breakfast Amponsah,  Annette Killings, MD       senna-docusate (Senokot-S) tablet 1 tablet  1 tablet Oral QHS PRN Vita Grip, MD       simvastatin  (ZOCOR ) tablet 40 mg  40 mg Oral QHS Amponsah, Prosper M, MD   40 mg at 07/11/23 2129    Allergies as of 07/11/2023 - Review Complete 07/11/2023  Allergen Reaction Noted   Aspirin Nausea And Vomiting and Nausea Only 06/27/2010   Codeine Nausea And Vomiting and Nausea Only 12/24/2022   Ibuprofen Nausea And Vomiting    Amoxicillin-pot clavulanate Other (See Comments) and Nausea Only 08/04/2013   Aricept [donepezil] Other (See Comments) 04/27/2022   Meperidine hcl Nausea And Vomiting    Namenda [memantine] Other (See Comments) 04/27/2022   Tape Other (See Comments) 01/20/2021    Family History  Problem Relation Age of Onset   Lung cancer Brother    Diabetes Mother     Social History   Socioeconomic History   Marital status: Widowed    Spouse name: Not on file   Number of children: 4   Years of education: Not on file   Highest education level: 10th grade  Occupational History    Comment: [employer]  Tobacco Use   Smoking status: Never   Smokeless tobacco: Never  Vaping Use   Vaping status:  Never Used  Substance and Sexual Activity   Alcohol use: Never   Drug use: Never   Sexual activity: Not Currently  Other Topics Concern   Not on file  Social History Narrative   06/29/20 lives with husband who had 2 strokes, caregiver 3 hours every day of week in mornings   Social Drivers of Health   Financial Resource Strain: Not on file  Food Insecurity: No Food Insecurity (04/27/2022)   Hunger Vital Sign    Worried About Running Out of Food in the Last Year: Never true    Ran Out of Food in the Last Year: Never true  Transportation Needs: No Transportation Needs (04/27/2022)   PRAPARE - Administrator, Civil Service (Medical): No    Lack of Transportation (Non-Medical): No  Physical Activity: Not on file  Stress: Not on file  Social Connections: Not on file  Intimate Partner Violence: Not At Risk (04/27/2022)   Humiliation, Afraid, Rape, and Kick questionnaire    Fear of Current or Ex-Partner: No    Emotionally Abused: No    Physically Abused: No    Sexually Abused: No    Review of Systems: As per HPI  Physical Exam: Vital signs in last 24 hours: Temp:  [98.1 F (36.7 C)-99.6 F (37.6 C)] 98.3 F (36.8 C) (05/02 0728) Pulse Rate:  [53-89] 67 (05/02 0728) Resp:  [16-18] 16 (05/02 0728) BP: (119-185)/(54-99) 172/64 (05/02 0728) SpO2:  [95 %-97 %] 97 % (05/02 0728) Last BM Date : 07/12/23  General:   Alert,  Well-developed, well-nourished, pleasant and cooperative in NAD Head:  Normocephalic and atraumatic. Eyes:  Sclera clear, no icterus.   Conjunctiva pink. Ears:  Normal auditory acuity. Nose:  No deformity, discharge,  or lesions. Mouth:  No deformity or lesions.  Oropharynx pink & moist. Neck:  Supple; no masses or thyromegaly. Lungs:  Clear throughout to auscultation.   No wheezes, crackles, or rhonchi. No acute distress. Heart:  Regular rate and rhythm; no murmurs, clicks, rubs,  or gallops. Extremities:  Without clubbing or edema. Neurologic:  Awake but not oriented to time place or person from underlying dementia Skin:  Intact without significant lesions or rashes. Psych:  Alert and cooperative. Normal mood and affect. Abdomen:  Soft, nontender and nondistended. No masses, hepatosplenomegaly or hernias noted. Normal bowel sounds, without guarding, and without rebound.         Lab Results: Recent Labs    07/11/23 1514 07/11/23 1526 07/12/23 0519  WBC 8.4  --  6.9  HGB 12.9 13.3 10.6*  HCT 39.5 39.0 35.0*  PLT 108*  --  90*   BMET Recent Labs    07/11/23 1514 07/11/23 1526 07/12/23 0519  NA 139 140 140  K 4.0 4.0 3.8  CL 106 108 110  CO2 20*  --  21*  GLUCOSE 116* 110* 84  BUN 31* 28* 31*  CREATININE 1.82* 1.90* 1.97*  CALCIUM 10.6*  --  9.7   LFT Recent Labs    07/11/23 1514  PROT 7.1  ALBUMIN 3.9  AST 19  ALT 10  ALKPHOS 44  BILITOT 1.1   PT/INR No results for input(s): "LABPROT", "INR" in the last 72 hours.  Studies/Results: CT Renal Stone Study Result Date: 07/11/2023 CLINICAL DATA:  Abdominal and flank pain. EXAM: CT ABDOMEN AND PELVIS WITHOUT CONTRAST TECHNIQUE: Multidetector CT imaging of the abdomen and pelvis was performed following the standard protocol without IV contrast. RADIATION DOSE REDUCTION: This exam was performed according to the departmental dose-optimization program which includes automated exposure control, adjustment of the mA and/or kV according to patient size and/or use of iterative reconstruction technique. COMPARISON:  01/28/2021 FINDINGS: Lower chest: No acute findings. Hepatobiliary: No mass visualized on this unenhanced exam. Gallbladder is unremarkable. No evidence of biliary ductal dilatation. Pancreas: Diffuse pancreatic atrophy again noted. No mass or inflammatory process visualized on this unenhanced exam. Spleen:  Within normal limits in size. Adrenals/Urinary tract: No evidence of urolithiasis or hydronephrosis. Several high attenuation proteinaceous left renal cysts  show no significant change. Moderate to severe diffuse bladder wall thickening is new since previous study, and consistent with cystitis. Stomach/Bowel: Numerous surgical clips are again seen in the region of the GE junction and proximal stomach. No evidence of obstruction, inflammatory process, or abnormal fluid collections. Vascular/Lymphatic: No pathologically enlarged lymph nodes identified. No evidence of abdominal aortic aneurysm. Reproductive: Prior hysterectomy noted. Adnexal regions are unremarkable in appearance. Other:  None. Musculoskeletal: No suspicious bone lesions identified. Lumbar spine fusion hardware and left hip internal fixation hardware again noted. IMPRESSION: Moderate to severe diffuse bladder wall thickening, consistent with cystitis. Suggest correlation with urinalysis. No evidence of urolithiasis or hydronephrosis. Electronically Signed   By: Marlyce Sine M.D.   On: 07/11/2023 17:01    Impression: Dysphagia, longstanding, history of esophageal food bolus impactions requiring emergent endoscopies  Barium swallow shows smooth 2.4 cm stricture in distal esophagus extending to gastroesophageal junction narrowed down to 0.5 cm in diameter, barium tablet impacted at this vicinity Nonspecific esophageal dysmotility Type II hiatal hernia versus partially unwrapped fundoplication  History of DVT was on Eliquis ?  Last dose today Comorbidities: Chronic kidney disease stage IV, diabetes, chronic diastolic heart failure, hypertension, COPD, obstructive sleep apnea, dyslipidemia  CT: Moderate to severe diffuse bladder wall thickening consistent with cystitis Anemia, hemoglobin 10.6, thrombocytopenia, platelet 90  Plan: Diagnostic EGD tomorrow, possible balloon dilation, possible Botox injection. The risks and the benefits of the procedure were discussed with the patient's daughter, Marin Shutters, [phone] over the phone. She has and verbalizes consent.   LOS: 0 days   Genell Ken, MD  07/12/2023, 8:45 AM

## 2023-07-12 NOTE — Assessment & Plan Note (Signed)
-   Resume Eliquis  after EGD tomorrow

## 2023-07-12 NOTE — Assessment & Plan Note (Addendum)
 Mild - Resume Depakote  and Lexapro  after EGD tomorrow

## 2023-07-12 NOTE — Progress Notes (Signed)
   07/12/23 1529  TOC Brief Assessment  Insurance and Status Reviewed  Patient has primary care physician Yes  Home environment has been reviewed single family home  Prior level of function: modified independent  Prior/Current Home Services No current home services  Social Drivers of Health Review SDOH reviewed no interventions necessary  Readmission risk has been reviewed Yes  Transition of care needs transition of care needs identified, TOC will continue to follow    [place], MSW, LCSW 07/12/2023 3:30 PM

## 2023-07-12 NOTE — Assessment & Plan Note (Signed)
 Continue Rocephin

## 2023-07-12 NOTE — Plan of Care (Signed)

## 2023-07-12 NOTE — Assessment & Plan Note (Addendum)
 Longstanding history of esophageal dysmotility, recurrent impactions requiring endoscopic treatment.    Admitted this time and barium esophagram show extensive dysmotility, stricture, and impingement of barium tablet.  GI consulted, planning for endoscopy. - Consult GI appreciate cares - Hold oral meds

## 2023-07-12 NOTE — Assessment & Plan Note (Signed)
 Creatinine stable relative to baseline, 1.7-2.1

## 2023-07-12 NOTE — Hospital Course (Signed)
 89 y.o. 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

## 2023-07-12 NOTE — Assessment & Plan Note (Signed)
 Appears euvolemic - Hold Lasix 

## 2023-07-13 ENCOUNTER — Encounter (HOSPITAL_COMMUNITY): Payer: Self-pay | Admitting: Student

## 2023-07-13 DIAGNOSIS — Z886 Allergy status to analgesic agent status: Secondary | ICD-10-CM | POA: Diagnosis not present

## 2023-07-13 DIAGNOSIS — Z794 Long term (current) use of insulin: Secondary | ICD-10-CM | POA: Diagnosis not present

## 2023-07-13 DIAGNOSIS — G4733 Obstructive sleep apnea (adult) (pediatric): Secondary | ICD-10-CM | POA: Diagnosis present

## 2023-07-13 DIAGNOSIS — I509 Heart failure, unspecified: Secondary | ICD-10-CM | POA: Diagnosis not present

## 2023-07-13 DIAGNOSIS — Z86718 Personal history of other venous thrombosis and embolism: Secondary | ICD-10-CM | POA: Diagnosis not present

## 2023-07-13 DIAGNOSIS — Z7901 Long term (current) use of anticoagulants: Secondary | ICD-10-CM | POA: Diagnosis not present

## 2023-07-13 DIAGNOSIS — I5032 Chronic diastolic (congestive) heart failure: Secondary | ICD-10-CM | POA: Diagnosis present

## 2023-07-13 DIAGNOSIS — J4489 Other specified chronic obstructive pulmonary disease: Secondary | ICD-10-CM | POA: Diagnosis present

## 2023-07-13 DIAGNOSIS — N184 Chronic kidney disease, stage 4 (severe): Secondary | ICD-10-CM | POA: Diagnosis present

## 2023-07-13 DIAGNOSIS — F0390 Unspecified dementia without behavioral disturbance: Secondary | ICD-10-CM | POA: Diagnosis present

## 2023-07-13 DIAGNOSIS — N3000 Acute cystitis without hematuria: Secondary | ICD-10-CM | POA: Diagnosis present

## 2023-07-13 DIAGNOSIS — Z91048 Other nonmedicinal substance allergy status: Secondary | ICD-10-CM | POA: Diagnosis not present

## 2023-07-13 DIAGNOSIS — I130 Hypertensive heart and chronic kidney disease with heart failure and stage 1 through stage 4 chronic kidney disease, or unspecified chronic kidney disease: Secondary | ICD-10-CM | POA: Diagnosis present

## 2023-07-13 DIAGNOSIS — Z885 Allergy status to narcotic agent status: Secondary | ICD-10-CM | POA: Diagnosis not present

## 2023-07-13 DIAGNOSIS — Z66 Do not resuscitate: Secondary | ICD-10-CM | POA: Diagnosis present

## 2023-07-13 DIAGNOSIS — E1122 Type 2 diabetes mellitus with diabetic chronic kidney disease: Secondary | ICD-10-CM | POA: Diagnosis present

## 2023-07-13 DIAGNOSIS — Z79899 Other long term (current) drug therapy: Secondary | ICD-10-CM | POA: Diagnosis not present

## 2023-07-13 DIAGNOSIS — Z888 Allergy status to other drugs, medicaments and biological substances status: Secondary | ICD-10-CM | POA: Diagnosis not present

## 2023-07-13 DIAGNOSIS — D696 Thrombocytopenia, unspecified: Secondary | ICD-10-CM | POA: Diagnosis present

## 2023-07-13 DIAGNOSIS — N189 Chronic kidney disease, unspecified: Secondary | ICD-10-CM | POA: Diagnosis not present

## 2023-07-13 DIAGNOSIS — J309 Allergic rhinitis, unspecified: Secondary | ICD-10-CM | POA: Diagnosis present

## 2023-07-13 DIAGNOSIS — R1310 Dysphagia, unspecified: Secondary | ICD-10-CM | POA: Diagnosis present

## 2023-07-13 DIAGNOSIS — Z6834 Body mass index (BMI) 34.0-34.9, adult: Secondary | ICD-10-CM | POA: Diagnosis not present

## 2023-07-13 DIAGNOSIS — E785 Hyperlipidemia, unspecified: Secondary | ICD-10-CM | POA: Diagnosis present

## 2023-07-13 DIAGNOSIS — K224 Dyskinesia of esophagus: Secondary | ICD-10-CM | POA: Diagnosis present

## 2023-07-13 DIAGNOSIS — K219 Gastro-esophageal reflux disease without esophagitis: Secondary | ICD-10-CM | POA: Diagnosis present

## 2023-07-13 LAB — GLUCOSE, CAPILLARY
Glucose-Capillary: 114 mg/dL — ABNORMAL HIGH (ref 70–99)
Glucose-Capillary: 139 mg/dL — ABNORMAL HIGH (ref 70–99)
Glucose-Capillary: 125 mg/dL — ABNORMAL HIGH (ref 70–99)
Glucose-Capillary: 167 mg/dL — ABNORMAL HIGH (ref 70–99)
Glucose-Capillary: 116 mg/dL — ABNORMAL HIGH (ref 70–99)
Glucose-Capillary: 115 mg/dL — ABNORMAL HIGH (ref 70–99)

## 2023-07-13 MED ORDER — SODIUM CHLORIDE 0.9% FLUSH: 3.0000 mL | Freq: Two times a day (BID) | INTRAVENOUS | Status: DC

## 2023-07-13 MED ORDER — SODIUM CHLORIDE 0.9% FLUSH: 3.0000 mL | INTRAVENOUS | Status: DC | PRN

## 2023-07-13 MED ORDER — ONABOTULINUMTOXINA 100 UNITS IJ SOLR
INTRAMUSCULAR | Status: AC
  Filled 2023-07-13: qty 100

## 2023-07-13 MED ORDER — GLUCAGON HCL RDNA (DIAGNOSTIC) 1 MG IJ SOLR
INTRAMUSCULAR | Status: AC
  Filled 2023-07-13: qty 1

## 2023-07-13 MED ORDER — EPINEPHRINE 1 MG/10ML IJ SOSY
PREFILLED_SYRINGE | INTRAMUSCULAR | Status: AC
  Filled 2023-07-13: qty 10

## 2023-07-13 NOTE — Progress Notes (Signed)
   07/13/23 2314  BiPAP/CPAP/SIPAP  Reason BIPAP/CPAP not in use Non-compliant

## 2023-07-13 NOTE — Plan of Care (Signed)
  Problem: Health Behavior/Discharge Planning: Goal: Ability to manage health-related needs will improve Outcome: Progressing   Problem: Metabolic: Goal: Ability to maintain appropriate glucose levels will improve Outcome: Progressing   Problem: Nutritional: Goal: Maintenance of adequate nutrition will improve Outcome: Progressing   Problem: Pain Managment: Goal: General experience of comfort will improve and/or be controlled Outcome: Progressing

## 2023-07-13 NOTE — Evaluation (Signed)
 Physical Therapy Evaluation Patient Details Name: [name] [name] MRN: [id number] DOB: 09/27/1929 Today's Date: 07/13/2023  History of Present Illness  89 yo female admitted with dysphagia. Hx of dementia, COPD, DM, recurrent VTE, CKD, CHF, DVT, OSA  Clinical Impression  On eval, pt was Supv level for mobility. She walked ~120 feet with a RW. Pt tolerated activity well. Will plan to follow pt acutely. Recommend daily hallway ambulation with nursing and/or mobility team as able.         If plan is discharge home, recommend the following: Supervision due to cognitive status;Assistance with cooking/housework;Assist for transportation;Help with stairs or ramp for entrance   Can travel by private vehicle        Equipment Recommendations None recommended by PT  Recommendations for Other Services       Functional Status Assessment Patient has had a recent decline in their functional status and demonstrates the ability to make significant improvements in function in a reasonable and predictable amount of time.     Precautions / Restrictions Precautions Precautions: Fall Restrictions Weight Bearing Restrictions Per Provider Order: No      Mobility  Bed Mobility Overal bed mobility: Needs Assistance Bed Mobility: Supine to Sit, Sit to Supine     Supine to sit: Supervision, HOB elevated Sit to supine: Supervision, HOB elevated        Transfers Overall transfer level: Needs assistance Equipment used: Rolling walker (2 wheels) Transfers: Sit to/from Stand Sit to Stand: Supervision           General transfer comment: Cues for safety    Ambulation/Gait Ambulation/Gait assistance: Supervision Gait Distance (Feet): 120 Feet Assistive device: Rolling walker (2 wheels) Gait Pattern/deviations: Step-through pattern, Decreased stride length       General Gait Details: Supv for safety. No LOB with RW use. Tolerated distance well.  Stairs            Wheelchair  Mobility     Tilt Bed    Modified Rankin (Stroke Patients Only)       Balance Overall balance assessment: Needs assistance         Standing balance support: Bilateral upper extremity supported, During functional activity, Reliant on assistive device for balance Standing balance-Leahy Scale: Fair                               Pertinent Vitals/Pain Pain Assessment Pain Assessment: No/denies pain    Home Living Family/patient expects to be discharged to:: Private residence   Available Help at Discharge: Family;Available 24 hours/day Type of Home: House         Home Layout: One level Home Equipment: Rolling Walker (2 wheels);Grab bars - tub/shower;Grab bars - toilet;Shower seat      Prior Function Prior Level of Function : Needs assist             Mobility Comments: uses RW ADLs Comments: family assists     Extremity/Trunk Assessment   Upper Extremity Assessment Upper Extremity Assessment: Overall WFL for tasks assessed    Lower Extremity Assessment Lower Extremity Assessment: Generalized weakness    Cervical / Trunk Assessment Cervical / Trunk Assessment: Normal  Communication   Communication Communication: No apparent difficulties    Cognition Arousal: Alert Behavior During Therapy: WFL for tasks assessed/performed   PT - Cognitive impairments: History of cognitive impairments  Following commands: Intact       Cueing Cueing Techniques: Verbal cues     General Comments      Exercises     Assessment/Plan    PT Assessment Patient needs continued PT services  PT Problem List Decreased strength;Decreased range of motion;Decreased balance;Decreased mobility;Decreased activity tolerance;Decreased knowledge of use of DME       PT Treatment Interventions DME instruction;Gait training;Functional mobility training;Therapeutic activities;Therapeutic exercise;Patient/family education;Balance  training    PT Goals (Current goals can be found in the Care Plan section)  Acute Rehab PT Goals Patient Stated Goal: none stated PT Goal Formulation: With patient Time For Goal Achievement: 07/27/23 Potential to Achieve Goals: Good    Frequency Min 3X/week     Co-evaluation               AM-PAC PT "6 Clicks" Mobility  Outcome Measure Help needed turning from your back to your side while in a flat bed without using bedrails?: None Help needed moving from lying on your back to sitting on the side of a flat bed without using bedrails?: A Little Help needed moving to and from a bed to a chair (including a wheelchair)?: A Little Help needed standing up from a chair using your arms (e.g., wheelchair or bedside chair)?: A Little Help needed to walk in hospital room?: A Little Help needed climbing 3-5 steps with a railing? : A Lot 6 Click Score: 18    End of Session Equipment Utilized During Treatment: Gait belt Activity Tolerance: Patient tolerated treatment well Patient left: in bed;with call bell/phone within reach;with bed alarm set        Time: 1434-1445 PT Time Calculation (min) (ACUTE ONLY): 11 min   Charges:   PT Evaluation $PT Eval Low Complexity: 1 Low   PT General Charges $$ ACUTE PT VISIT: 1 Visit            Tanda Falter, PT Acute Rehabilitation  Office: [phone]

## 2023-07-13 NOTE — Plan of Care (Signed)

## 2023-07-13 NOTE — OR Nursing (Signed)
 Procedure moved to 07/14/23 for patient safety had Eliquis  on 5/2 and during procedure patient may require dilation. GI provider aware. Lucetta Russel [place]

## 2023-07-13 NOTE — Progress Notes (Signed)
  Progress Note   Patient: [name] [name] WGN:[id number] DOB: 1929/04/19 DOA: 07/11/2023     0 DOS: the patient was seen and examined on 07/13/2023 at 10:12AM      Brief hospital course: 89 y.o. 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     Assessment and Plan: * Dysphagia EGD planned, delayed due to Eliquis  washout - Consult GI, appreciate cares - Clears only - Hold oral meds due to impacted tablet) swallow    Chronic diastolic CHF (congestive heart failure) (HCC) Appears euvolemic - Hold Lasix   CKD (chronic kidney disease) stage 4, GFR 15-29 ml/min (HCC) Creatinine stable relative to baseline, 1.7-2.1  Type 2 diabetes mellitus with hyperlipidemia (HCC) A1c 6.3%, well-controlled Glucose labile in the last 24 hours - Hold Toujeo  while n.p.o. - Continue sliding scale correction  Essential hypertension Blood pressure elevated off home meds - Hold carvedilol , furosemide , hydralazine , Imdur  - As needed hydralazine  and labetalol  for high range pressures  OBESITY BMI 34, complicates care, class II obesity  History of DVT (deep vein thrombosis) - Resume Eliquis  after EGD tomorrow  Dementia (HCC) Mild - Resume Depakote  and Lexapro  after EGD tomorrow  Thrombocytopenia (HCC) Mild, appears to be trending down over months.  ?NASH liver disease - Outpatient follow-up  Acute cystitis - Continue Rocephin   COPD (chronic obstructive pulmonary disease) (HCC) No wheezing, no active disease - Continue albuterol  as needed  Hyperlipidemia - Resume simvastatin  after EGD tomorrow  Obstructive sleep apnea Noncompliant with CPAP          Subjective: Patient has no complaints.  No vomiting, no chest pain.     Physical Exam: BP (!) 168/78   Pulse 68   Temp 97.7 F (36.5 C) (Oral)   Resp (!) 22   Ht 5' (1.524 m)   Wt  87 kg   SpO2 97%   BMI 37.46 kg/m   Elderly adult female, lying in bed, interactive and appropriate RRR, no murmurs, no peripheral edema respiratory rate normal, lungs clear without rales or wheezes Abdomen soft, no tenderness palpation, no ascites    Data Reviewed: Glucose elevated  Family Communication:     Disposition: Status is: Inpatient         Author: Ephriam Hashimoto, MD 07/13/2023 1:56 PM  For on call review [email or web address].

## 2023-07-13 NOTE — Progress Notes (Signed)
 Patient originally scheduled for EGD with possible balloon dilation and possible Botox injection today. Eliquis  was discontinued yesterday but she seems to have received her morning dose of Eliquis  prior to it being discontinued at 10 AM. Will reschedule the procedure for tomorrow morning as Eliquis  needs to be on hold for at least 24 hours. Discussed this with patient's daughter Marin Shutters over the phone who understands and consents. I have sent a secure chat to patient's hospitalist Dr. Fernande Howells regarding the same. I will start on clear liquid diet and keep her n.p.o. postmidnight for the procedure to be performed tomorrow morning.

## 2023-07-13 NOTE — H&P (View-Only) (Signed)
 Patient originally scheduled for EGD with possible balloon dilation and possible Botox injection today. Eliquis  was discontinued yesterday but she seems to have received her morning dose of Eliquis  prior to it being discontinued at 10 AM. Will reschedule the procedure for tomorrow morning as Eliquis  needs to be on hold for at least 24 hours. Discussed this with patient's daughter Marin Shutters over the phone who understands and consents. I have sent a secure chat to patient's hospitalist Dr. Fernande Howells regarding the same. I will start on clear liquid diet and keep her n.p.o. postmidnight for the procedure to be performed tomorrow morning.

## 2023-07-14 ENCOUNTER — Observation Stay (HOSPITAL_COMMUNITY): Admitting: Certified Registered Nurse Anesthetist

## 2023-07-14 ENCOUNTER — Encounter (HOSPITAL_COMMUNITY)
Admission: EM | Disposition: A | Discharge status: Home / Self Care | Payer: Self-pay | Source: Home / Self Care | Attending: Student

## 2023-07-14 ENCOUNTER — Encounter (HOSPITAL_COMMUNITY): Payer: Self-pay | Admitting: Family Medicine

## 2023-07-14 DIAGNOSIS — N189 Chronic kidney disease, unspecified: Secondary | ICD-10-CM

## 2023-07-14 DIAGNOSIS — R1310 Dysphagia, unspecified: Secondary | ICD-10-CM

## 2023-07-14 DIAGNOSIS — I509 Heart failure, unspecified: Secondary | ICD-10-CM

## 2023-07-14 DIAGNOSIS — I130 Hypertensive heart and chronic kidney disease with heart failure and stage 1 through stage 4 chronic kidney disease, or unspecified chronic kidney disease: Secondary | ICD-10-CM | POA: Diagnosis not present

## 2023-07-14 LAB — BASIC METABOLIC PANEL WITH GFR
Anion gap: 8 (ref 5–15)
BUN: 30 mg/dL — ABNORMAL HIGH (ref 8–23)
CO2: 22 mmol/L (ref 22–32)
Calcium: 10.1 mg/dL (ref 8.9–10.3)
Chloride: 110 mmol/L (ref 98–111)
Creatinine, Ser: 2.04 mg/dL — ABNORMAL HIGH (ref 0.44–1.00)
GFR, Estimated: 23 mL/min — ABNORMAL LOW (ref >60)
Glucose, Bld: 107 mg/dL — ABNORMAL HIGH (ref 70–99)
Potassium: 3.7 mmol/L (ref 3.5–5.1)
Sodium: 140 mmol/L (ref 135–145)

## 2023-07-14 LAB — CBC
HCT: 35.3 % — ABNORMAL LOW (ref 36.0–46.0)
Hemoglobin: 11.1 g/dL — ABNORMAL LOW (ref 12.0–15.0)
MCH: 30.2 pg (ref 26.0–34.0)
MCHC: 31.4 g/dL (ref 30.0–36.0)
MCV: 96.2 fL (ref 80.0–100.0)
Platelets: 102 10*3/uL — ABNORMAL LOW (ref 150–400)
RBC: 3.67 MIL/uL — ABNORMAL LOW (ref 3.87–5.11)
RDW: 14 % (ref 11.5–15.5)
WBC: 5.5 10*3/uL (ref 4.0–10.5)
nRBC: 0 % (ref 0.0–0.2)

## 2023-07-14 LAB — GLUCOSE, CAPILLARY
Glucose-Capillary: 120 mg/dL — ABNORMAL HIGH (ref 70–99)
Glucose-Capillary: 122 mg/dL — ABNORMAL HIGH (ref 70–99)
Glucose-Capillary: 130 mg/dL — ABNORMAL HIGH (ref 70–99)
Glucose-Capillary: 141 mg/dL — ABNORMAL HIGH (ref 70–99)
Glucose-Capillary: 158 mg/dL — ABNORMAL HIGH (ref 70–99)
Glucose-Capillary: 173 mg/dL — ABNORMAL HIGH (ref 70–99)

## 2023-07-14 SURGERY — EGD (ESOPHAGOGASTRODUODENOSCOPY)
Anesthesia: Monitor Anesthesia Care

## 2023-07-14 MED ORDER — SODIUM CHLORIDE 0.9 % IV SOLN
INTRAVENOUS | Status: AC | PRN
  Administered 2023-07-14: 500 mL via INTRAVENOUS

## 2023-07-14 MED ORDER — HYDRALAZINE HCL 50 MG PO TABS
50.0000 mg | ORAL_TABLET | Freq: Three times a day (TID) | ORAL | Status: DC
Start: 2023-07-14 — End: 2023-07-15
  Administered 2023-07-14 – 2023-07-15 (×4): 50 mg via ORAL
  Filled 2023-07-14 (×4): qty 1

## 2023-07-14 MED ORDER — DIVALPROEX SODIUM 125 MG PO DR TAB
125.0000 mg | DELAYED_RELEASE_TABLET | Freq: Two times a day (BID) | ORAL | Status: DC
Start: 2023-07-14 — End: 2023-07-15
  Administered 2023-07-14 – 2023-07-15 (×3): 125 mg via ORAL
  Filled 2023-07-14 (×3): qty 1

## 2023-07-14 MED ORDER — ISOSORBIDE MONONITRATE ER 30 MG PO TB24
30.0000 mg | ORAL_TABLET | Freq: Every day | ORAL | Status: DC
  Administered 2023-07-14 – 2023-07-15 (×2): 30 mg via ORAL
  Filled 2023-07-14 (×2): qty 1

## 2023-07-14 MED ORDER — SIMVASTATIN 40 MG PO TABS
40.0000 mg | ORAL_TABLET | Freq: Every day | ORAL | Status: DC
  Administered 2023-07-14: 40 mg via ORAL
  Filled 2023-07-14: qty 1

## 2023-07-14 MED ORDER — PROPOFOL 500 MG/50ML IV EMUL
INTRAVENOUS | Status: DC | PRN
  Administered 2023-07-14: 125 ug/kg/min via INTRAVENOUS

## 2023-07-14 MED ORDER — ONABOTULINUMTOXINA 100 UNITS IJ SOLR
INTRAMUSCULAR | Status: DC | PRN
  Administered 2023-07-14: 100 [IU] via INTRAMUSCULAR

## 2023-07-14 MED ORDER — ESCITALOPRAM OXALATE 10 MG PO TABS
10.0000 mg | ORAL_TABLET | Freq: Every day | ORAL | Status: DC
Start: 2023-07-14 — End: 2023-07-15
  Administered 2023-07-14 – 2023-07-15 (×2): 10 mg via ORAL
  Filled 2023-07-14 (×2): qty 1

## 2023-07-14 MED ORDER — SODIUM CHLORIDE (PF) 0.9 % IJ SOLN
INTRAMUSCULAR | Status: AC
  Filled 2023-07-14: qty 10

## 2023-07-14 MED ORDER — APIXABAN 2.5 MG PO TABS
2.5000 mg | ORAL_TABLET | Freq: Two times a day (BID) | ORAL | Status: DC
  Administered 2023-07-14 – 2023-07-15 (×3): 2.5 mg via ORAL
  Filled 2023-07-14 (×3): qty 1

## 2023-07-14 MED ORDER — PROPOFOL 10 MG/ML IV BOLUS
INTRAVENOUS | Status: DC | PRN
Start: 2023-07-14 — End: 2023-07-14
  Administered 2023-07-14: 20 mg via INTRAVENOUS

## 2023-07-14 MED ORDER — SODIUM CHLORIDE 0.9 % IV SOLN: INTRAVENOUS | Status: DC | PRN

## 2023-07-14 MED ORDER — FUROSEMIDE 40 MG PO TABS
40.0000 mg | ORAL_TABLET | Freq: Every day | ORAL | Status: DC
  Administered 2023-07-14 – 2023-07-15 (×2): 40 mg via ORAL
  Filled 2023-07-14 (×2): qty 1

## 2023-07-14 MED ORDER — ONABOTULINUMTOXINA 100 UNITS IJ SOLR
INTRAMUSCULAR | Status: AC
  Filled 2023-07-14: qty 100

## 2023-07-14 MED ORDER — CARVEDILOL 6.25 MG PO TABS
6.2500 mg | ORAL_TABLET | Freq: Two times a day (BID) | ORAL | Status: DC
  Administered 2023-07-14 – 2023-07-15 (×2): 6.25 mg via ORAL
  Filled 2023-07-14 (×2): qty 1

## 2023-07-14 NOTE — Anesthesia Postprocedure Evaluation (Signed)
 Anesthesia Post Note  Patient: [name] [name]  Procedure(s) Performed: EGD (ESOPHAGOGASTRODUODENOSCOPY) EGD, WITH BOTULINUM TOXIN INJECTION     Patient location during evaluation: PACU Anesthesia Type: MAC Level of consciousness: awake and alert Pain management: pain level controlled Vital Signs Assessment: post-procedure vital signs reviewed and stable Respiratory status: spontaneous breathing, nonlabored ventilation and respiratory function stable Cardiovascular status: blood pressure returned to baseline and stable Postop Assessment: no apparent nausea or vomiting Anesthetic complications: no   No notable events documented.  Last Vitals:  Vitals:   07/14/23 0820 07/14/23 0830  BP: (!) 170/55 (!) 173/83  Pulse: (!) 54 66  Resp: (!) 22 (!) 22  Temp:    SpO2: 97% 96%    Last Pain:  Vitals:   07/14/23 0830  TempSrc:   PainSc: 0-No pain                 Earvin Goldberg

## 2023-07-14 NOTE — Progress Notes (Signed)
   07/14/23 2303  BiPAP/CPAP/SIPAP  Reason BIPAP/CPAP not in use Non-compliant

## 2023-07-14 NOTE — Op Note (Signed)
 [facility] Patient Name: [name] [name] Procedure Date: 07/14/2023 MRN: [id number] Attending MD: Genell Ken , MD, [id number] Date of Birth: 17-Feb-1930 CSN: [id number] Age: 89 Admit Type: Inpatient Procedure:                Upper GI endoscopy Indications:              Dysphagia, Abnormal cine-esophagram, Esophageal                            stricture noted on barium swallow Providers:                Genell Ken, MD, Nikki Barters RN, RN, Adam Adam, Technician Referring MD:             [facility] hospitalist Medicines:                Monitored Anesthesia Care Complications:            No immediate complications. Estimated Blood Loss:     Estimated blood loss: none. Procedure:                Pre-Anesthesia Assessment:                           - Prior to the procedure, a History and Physical                            was performed, and patient medications and                            allergies were reviewed. The patient's tolerance of                            previous anesthesia was also reviewed. The risks                            and benefits of the procedure and the sedation                            options and risks were discussed with the patient.                            All questions were answered, and informed consent                            was obtained. Prior Anticoagulants: The patient has                            taken Eliquis  (apixaban ), last dose was 2 days                            prior to procedure. ASA Grade Assessment: III - A  patient with severe systemic disease. After                            reviewing the risks and benefits, the patient was                            deemed in satisfactory condition to undergo the                            procedure.                           After obtaining informed consent, the endoscope was                            passed under direct  vision. Throughout the                            procedure, the patient's blood pressure, pulse, and                            oxygen saturations were monitored continuously. The                            [id number]-[id number] ([id number]) Olympus endoscope was introduced                            through the mouth, and advanced to the second part                            of duodenum. The upper GI endoscopy was                            accomplished without difficulty. The patient                            tolerated the procedure well. Scope In: Scope Out: Findings:      The examined esophagus was moderately tortuous.      There was no evidence of luminal narrowing or esophageal stricture.       There was no resistance felt on passage of adult gastroscope. Area was       successfully injected with 100 units botulinum toxin, 25 units/mL, 1 mL       in each of the 4 quadrants of distal esophagus, 1 cm above GE junction.      Evidence of a fundoplication was found in the gastric fundus. The wrap       appeared intact. This was traversed.      The greater curvature of the stomach, lesser curvature of the stomach,       incisura, gastric antrum, prepyloric region of the stomach and pylorus       were normal.      The examined duodenum was normal. Impression:               - Tortuous esophagus.                           -  A fundoplication was found. The wrap appears                            intact.                           - Normal greater curvature of the stomach, lesser                            curvature of the stomach, incisura, antrum,                            prepyloric region of the stomach and pylorus.                           - Normal examined duodenum.                           - No specimens collected. Moderate Sedation:      Patient did not receive moderate sedation for this procedure, but       instead received monitored anesthesia care. Recommendation:           - Mechanical  soft diet.                           - Continue present medications.                           - Resume Eliquis  (apixaban ) at prior dose today. Procedure Code(s):        --- Professional ---                           [id number], Esophagogastroduodenoscopy, flexible,                            transoral; with directed submucosal injection(s),                            any substance Diagnosis Code(s):        --- Professional ---                           Q39.9, Congenital malformation of esophagus,                            unspecified                           Z98.890, Other specified postprocedural states                           R13.10, Dysphagia, unspecified                           R93.3, Abnormal findings on diagnostic imaging of                            other parts of  digestive tract CPT copyright 2022 [employer]. All rights reserved. The codes documented in this report are preliminary and upon coder review may  be revised to meet current compliance requirements. Genell Ken, MD 07/14/2023 8:01:24 AM This report has been signed electronically. Number of Addenda: 0

## 2023-07-14 NOTE — Discharge Summary (Signed)
 Physician Discharge Summary   Patient: [name] [name] MRN: [id number] DOB: August 27, 1929  Admit date:     07/11/2023  Discharge date: 07/14/23  Discharge Physician: Ephriam Hashimoto   PCP: Jeannine Milroy., MD     Recommendations at discharge:  Follow up with [facility] for esophageal dysmotility Follow up with Dr. Bernetta Brilliant for thrombocytopenia Dr. Bernetta Brilliant: Please repeat CBC in 1-2 weeks     Discharge Diagnoses: Principal Problem:   Dysphagia Active Problems:   Chronic diastolic CHF (congestive heart failure) (HCC)   CKD (chronic kidney disease) stage 4, GFR 15-29 ml/min (HCC)   Type 2 diabetes mellitus with hyperlipidemia (HCC)   Essential hypertension   OBESITY   History of DVT (deep vein thrombosis)   Dementia (HCC)   Obstructive sleep apnea   Hyperlipidemia   COPD (chronic obstructive pulmonary disease) (HCC)   Acute cystitis   Thrombocytopenia [facility])      Hospital Course: 89 y.o. 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      * Dysphagia On the day of admission, the patient had an episode where she was choking, and possibly aspirated.    GI was consulted, with whom she has a longstanding relationship because of her history of esophageal dysmotility, recurrent food impactions requiring endoscopic disimpaction.     She underwent barium esophagram this admission which showed extensive dysmotility, possible stricture, and impingement of a barium tablet.    Her Eliquis  was held, GI performed endoscopy which showed dysmotility, but no stricture, but did show an intact fundoplication wrap, normal stomach, and normal duodenum.  Botox injections were performed.  Afterward she was able to tolerate a chopped soft diet, able to tolerate oral pills, and was discharged home with GI follow-up.    Chronic diastolic CHF (congestive  heart failure) (HCC) Appears euvolemic here, continue her Lasix .  CKD (chronic kidney disease) stage 4, GFR 15-29 ml/min (HCC) Creatinine stable relative to baseline, 1.7-2.1  Type 2 diabetes mellitus with hyperlipidemia (HCC) A1c 6.3%, well-controlled  Essential hypertension Blood pressure elevated here due to not being able to take her pills.  Continue home carvedilol , furosemide , hydralazine , Imdur .  OBESITY BMI 34, complicates care, class II obesity  History of DVT (deep vein thrombosis) Resume Eliquis   Dementia (HCC) Mild  Thrombocytopenia (HCC) Mild, appears to be trending down over months.  ?NASH liver disease - Outpatient follow-up  Acute cystitis Treated with Rocephin  here, recommend continue home course of cephalexin.  COPD (chronic obstructive pulmonary disease) (HCC) No wheezing, no active disease  Hyperlipidemia On simvastatin   Obstructive sleep apnea Noncompliant with CPAP            The [place]  Controlled Substances Registry was reviewed for this patient prior to discharge.   Consultants: GI  Procedures performed: EGD   Disposition: Home Diet recommendation: Chopped   DISCHARGE MEDICATION: Allergies as of 07/14/2023       Reactions   Aspirin Nausea And Vomiting, Nausea Only   Codeine Nausea And Vomiting, Nausea Only   Ibuprofen Nausea And Vomiting   Amoxicillin-pot Clavulanate Other (See Comments), Nausea Only   GI upset   Aricept [donepezil] Other (See Comments)   Gi upset    Meperidine Hcl Nausea And Vomiting   Namenda [memantine] Other (See Comments)   Gi upset    Tape Other (See Comments)   ADHESIVE TAPE CAUSES BLISTERS  Medication List     TAKE these medications    acetaminophen  500 MG tablet Commonly known as: TYLENOL  Take 1,000 mg by mouth in the morning and at bedtime.   albuterol  108 (90 Base) MCG/ACT inhaler Commonly known as: ProAir  HFA 2 puffs every 4-6 hours as directed- rescue What changed:   how much to take how to take this when to take this reasons to take this additional instructions   carvedilol  6.25 MG tablet Commonly known as: COREG  Take 6.25 mg by mouth 2 (two) times daily with a meal.   cephALEXin 500 MG capsule Commonly known as: KEFLEX Take 500 mg by mouth 4 (four) times daily.   divalproex  125 MG DR tablet Commonly known as: DEPAKOTE  Take 125 mg by mouth 2 (two) times daily.   Eliquis  2.5 MG Tabs tablet Generic drug: apixaban  Take 1 tablet (2.5 mg total) by mouth 2 (two) times daily.   escitalopram  10 MG tablet Commonly known as: LEXAPRO  Take 10 mg by mouth daily.   furosemide  40 MG tablet Commonly known as: LASIX  Take 1 tablet (40 mg total) by mouth daily.   hydrALAZINE  50 MG tablet Commonly known as: APRESOLINE  Take 1 tablet (50 mg total) by mouth 3 (three) times daily. What changed: when to take this   isosorbide  mononitrate 30 MG 24 hr tablet Commonly known as: IMDUR  Take 1 tablet (30 mg total) by mouth daily.   L-Lysine  1000 MG Tabs Take 1,000 mg by mouth daily.   loratadine  10 MG tablet Commonly known as: CLARITIN  Take 10 mg by mouth daily as needed for allergies.   omeprazole  20 MG tablet Commonly known as: PRILOSEC  OTC Take 20 mg by mouth daily before breakfast.   simvastatin  40 MG tablet Commonly known as: ZOCOR  Take 1 tablet (40 mg total) by mouth every evening. What changed: when to take this   Toujeo  SoloStar 300 UNIT/ML Solostar Pen Generic drug: insulin  glargine (1 Unit Dial ) Inject 20 Units into the skin at bedtime.        Follow-up Information     Jeannine Milroy., MD. Call today.   Specialty: Internal Medicine Why: discuss your visit, see when they want to see you in the office Contact information: [address] [place] [address] [phone]         Genell Ken, MD Follow up.   Specialty: Gastroenterology Contact information: [address] [place]  [address] [phone]                 Discharge Instructions     Discharge instructions   Complete by: As directed    **IMPORTANT DISCHARGE INSTRUCTIONS**   From Dr. Darlyn Eke: You were admitted for not being able to swallow Here, you had a barium esophagram and an endoscopy (EGD)  The endoscopy directly visualized your esophagus (food tube) and showed that there were no narrowings there, the hernia repair was normal/intact and you just have abnormal muscle movement of the esophagus.  To improve this, Dr. Feliberto Hopping injected botox  Strictly adhere to a chopped soft diet Make sure foods are cut into small bites and make sure meats are in gravy AVOID dry meats and avoid breads/cakes  Continue your normal home medicines  Call [facility] (see below in the To Do section under Dr. Shelle Devon name) for a follow up appointment   Increase activity slowly   Complete by: As directed        Discharge Exam: Filed Weights   07/13/23  0726  Weight: 87 kg    General: Pt is alert, awake, not in acute distress Cardiovascular: RRR, nl S1-S2, no murmurs appreciated.   No LE edema.   Respiratory: Normal respiratory rate and rhythm.  CTAB without rales or wheezes. Abdominal: Abdomen soft and non-tender.  No distension or HSM.   Neuro/Psych: Strength symmetric in upper and lower extremities.  Judgment and insight appear impaired but at baseline.   Condition at discharge: good  The results of significant diagnostics from this hospitalization (including imaging, microbiology, ancillary and laboratory) are listed below for reference.   Imaging Studies: DG ESOPHAGUS W SINGLE CM (SOL OR THIN BA) Result Date: 07/12/2023 CLINICAL DATA:  Dysphagia EXAM: ESOPHOGRAM/BARIUM SWALLOW TECHNIQUE: Single contrast examination was performed using  thin barium. FLUOROSCOPY: Radiation Exposure Index (as provided by the fluoroscopic device): 6.9 mGy Kerma COMPARISON:  Chest radiograph 02/14/2022 FINDINGS: Due to  unsteadiness on the patient's feet resulting an unacceptable fall risk performing the standing portions of the exam, the entire examination was performed with patient in the LPO position. As result the pharyngeal phase portion of the exam was not formally performed, although no frank tracheal aspiration was observed during today's examination. Primary peristaltic waves in the esophagus were disrupted in the proximal thoracic esophagus on all swallows. Secondary and tertiary contractions noted. The distal esophagus is smoothly narrowed at about 0.5 cm in diameter as on image 34 series 3, along a 2.4 cm segment extending to the gastroesophageal junction. Just above this, a portion of the stomach extends up through the gastroesophageal junction adjacent to the narrowed part of the distal esophagus as shown on image 34 series 3. This could represent a fundoplication wrap that has un wrapped and herniated, given the surrounding clips which indicate prior hiatal hernia repair. Otherwise this may represent a type 2 hiatal hernia. Atherosclerotic calcification of the aortic arch. A 13 mm barium tablet impacted at the narrow portion of the distal esophagus and did not proceed further during observation. IMPRESSION: 1. Smooth 2.4 cm stricture of the distal esophagus extending to the gastroesophageal junction. This area was narrowed down to 0.5 cm in diameter. A barium tablet impacted in this vicinity. 2. Nonspecific esophageal dysmotility disorder with secondary and tertiary contractions. 3. Type 2 hiatal hernia versus partially un-wrapped fundoplication wrap extending cephalad through the hiatus. 4.  Aortic Atherosclerosis (ICD10-I70.0). Electronically Signed   By: Freida Jes M.D.   On: 07/12/2023 09:58   CT Renal Stone Study Result Date: 07/11/2023 CLINICAL DATA:  Abdominal and flank pain. EXAM: CT ABDOMEN AND PELVIS WITHOUT CONTRAST TECHNIQUE: Multidetector CT imaging of the abdomen and pelvis was performed  following the standard protocol without IV contrast. RADIATION DOSE REDUCTION: This exam was performed according to the departmental dose-optimization program which includes automated exposure control, adjustment of the mA and/or kV according to patient size and/or use of iterative reconstruction technique. COMPARISON:  01/28/2021 FINDINGS: Lower chest: No acute findings. Hepatobiliary: No mass visualized on this unenhanced exam. Gallbladder is unremarkable. No evidence of biliary ductal dilatation. Pancreas: Diffuse pancreatic atrophy again noted. No mass or inflammatory process visualized on this unenhanced exam. Spleen:  Within normal limits in size. Adrenals/Urinary tract: No evidence of urolithiasis or hydronephrosis. Several high attenuation proteinaceous left renal cysts show no significant change. Moderate to severe diffuse bladder wall thickening is new since previous study, and consistent with cystitis. Stomach/Bowel: Numerous surgical clips are again seen in the region of the GE junction and proximal stomach. No evidence of obstruction, inflammatory process,  or abnormal fluid collections. Vascular/Lymphatic: No pathologically enlarged lymph nodes identified. No evidence of abdominal aortic aneurysm. Reproductive: Prior hysterectomy noted. Adnexal regions are unremarkable in appearance. Other:  None. Musculoskeletal: No suspicious bone lesions identified. Lumbar spine fusion hardware and left hip internal fixation hardware again noted. IMPRESSION: Moderate to severe diffuse bladder wall thickening, consistent with cystitis. Suggest correlation with urinalysis. No evidence of urolithiasis or hydronephrosis. Electronically Signed   By: Marlyce Sine M.D.   On: 07/11/2023 17:01    Microbiology: Results for orders placed or performed during the hospital encounter of 02/12/22  Resp Panel by RT-PCR (Flu A&B, Covid) Anterior Nasal Swab     Status: None   Collection Time: 02/12/22 10:24 AM   Specimen:  Anterior Nasal Swab  Result Value Ref Range Status   SARS Coronavirus 2 by RT PCR NEGATIVE NEGATIVE Final    Comment: (NOTE) SARS-CoV-2 target nucleic acids are NOT DETECTED.  The SARS-CoV-2 RNA is generally detectable in upper respiratory specimens during the acute phase of infection. The lowest concentration of SARS-CoV-2 viral copies this assay can detect is 138 copies/mL. A negative result does not preclude SARS-Cov-2 infection and should not be used as the sole basis for treatment or other patient management decisions. A negative result may occur with  improper specimen collection/handling, submission of specimen other than nasopharyngeal swab, presence of viral mutation(s) within the areas targeted by this assay, and inadequate number of viral copies(<138 copies/mL). A negative result must be combined with clinical observations, patient history, and epidemiological information. The expected result is Negative.  Fact Sheet for Patients:  BloggerCourse.com  Fact Sheet for Healthcare Providers:  SeriousBroker.it  This test is no t yet approved or cleared by the [place]  FDA and  has been authorized for detection and/or diagnosis of SARS-CoV-2 by FDA under an Emergency Use Authorization (EUA). This EUA will remain  in effect (meaning this test can be used) for the duration of the COVID-19 declaration under Section 564(b)(1) of the Act, 21 U.S.C.section 360bbb-3(b)(1), unless the authorization is terminated  or revoked sooner.       Influenza A by PCR NEGATIVE NEGATIVE Final   Influenza B by PCR NEGATIVE NEGATIVE Final    Comment: (NOTE) The Xpert Xpress SARS-CoV-2/FLU/RSV plus assay is intended as an aid in the diagnosis of influenza from Nasopharyngeal swab specimens and should not be used as a sole basis for treatment. Nasal washings and aspirates are unacceptable for Xpert Xpress SARS-CoV-2/FLU/RSV testing.  Fact  Sheet for Patients: BloggerCourse.com  Fact Sheet for Healthcare Providers: SeriousBroker.it  This test is not yet approved or cleared by the [place]  FDA and has been authorized for detection and/or diagnosis of SARS-CoV-2 by FDA under an Emergency Use Authorization (EUA). This EUA will remain in effect (meaning this test can be used) for the duration of the COVID-19 declaration under Section 564(b)(1) of the Act, 21 U.S.C. section 360bbb-3(b)(1), unless the authorization is terminated or revoked.  Performed at [facility], [address] [place], [place] [address]     Labs: CBC: Recent Labs  Lab 07/11/23 1514 07/11/23 1526 07/12/23 0519 07/14/23 0555  WBC 8.4  --  6.9 5.5  NEUTROABS 5.3  --   --   --   HGB 12.9 13.3 10.6* 11.1*  HCT 39.5 39.0 35.0* 35.3*  MCV 93.4  --  99.7 96.2  PLT 108*  --  90* 102*   Basic Metabolic Panel: Recent Labs  Lab 07/11/23 1514 07/11/23 1526 07/12/23  0519 07/14/23 0555  NA 139 140 140 140  K 4.0 4.0 3.8 3.7  CL 106 108 110 110  CO2 20*  --  21* 22  GLUCOSE 116* 110* 84 107*  BUN 31* 28* 31* 30*  CREATININE 1.82* 1.90* 1.97* 2.04*  CALCIUM 10.6*  --  9.7 10.1   Liver Function Tests: Recent Labs  Lab 07/11/23 1514  AST 19  ALT 10  ALKPHOS 44  BILITOT 1.1  PROT 7.1  ALBUMIN 3.9   CBG: Recent Labs  Lab 07/13/23 1614 07/13/23 2010 07/13/23 2327 07/14/23 0420 07/14/23 0858  GLUCAP 167* 116* 115* 120* 122*    Discharge time spent: approximately 45 minutes spent on discharge counseling, evaluation of patient on day of discharge, and coordination of discharge planning with nursing, social work, pharmacy and case management  Signed: Ephriam Hashimoto, MD [facility] 07/14/2023

## 2023-07-14 NOTE — Interval H&P Note (Signed)
 History and Physical Interval Note: 89/female with dysphagia, abnormal barium swallow for EGD with possible balloon dilation and possible botox inejction with propofol .  07/14/2023 7:30 AM  [name] [name]  has presented today for EGD with possible balloon dilation and possible botox inejction with propofol , with the diagnosis of dysphagia, possible botox injection.  The various methods of treatment have been discussed with the patient and family. After consideration of risks, benefits and other options for treatment, the patient has consented to  Procedure(s): EGD (ESOPHAGOGASTRODUODENOSCOPY) (N/A) as a surgical intervention.  The patient's history has been reviewed, patient examined, no change in status, stable for surgery.  I have reviewed the patient's chart and labs.  Questions were answered to the patient's satisfaction.     Genell Ken

## 2023-07-14 NOTE — Transfer of Care (Signed)
 Immediate Anesthesia Transfer of Care Note  Patient: [name] [name]  Procedure(s) Performed: EGD (ESOPHAGOGASTRODUODENOSCOPY) EGD, WITH BOTULINUM TOXIN INJECTION  Patient Location: PACU  Anesthesia Type:MAC  Level of Consciousness: drowsy and patient cooperative  Airway & Oxygen Therapy: Patient Spontanous Breathing and Patient connected to nasal cannula oxygen  Post-op Assessment: Report given to RN and Post -op Vital signs reviewed and stable  Post vital signs: Reviewed and stable  Last Vitals:  Vitals Value Taken Time  BP 138/59 07/14/23 0804  Temp    Pulse 67 07/14/23 0806  Resp 20 07/14/23 0806  SpO2 96 % 07/14/23 0806  Vitals shown include unfiled device data.  Last Pain:  Vitals:   07/14/23 0718  TempSrc:   PainSc: 0-No pain         Complications: No notable events documented.

## 2023-07-14 NOTE — Plan of Care (Signed)

## 2023-07-15 LAB — GLUCOSE, CAPILLARY
Glucose-Capillary: 113 mg/dL — ABNORMAL HIGH (ref 70–99)
Glucose-Capillary: 125 mg/dL — ABNORMAL HIGH (ref 70–99)

## 2023-07-15 NOTE — Care Plan (Signed)
 The patient was discharged yesterday but family did not pick her up.  Overnight, no new concerns from nursing.  Tolerated dinner well last night.  Coughed up some eggs this morning, but took her evening and morning pills without difficulty and had no complaints to nursing.  No change to plan from yesterday, defer all follow up plans to [facility].

## 2023-07-16 ENCOUNTER — Encounter (HOSPITAL_COMMUNITY): Payer: Self-pay | Admitting: Gastroenterology

## 2023-08-08 ENCOUNTER — Emergency Department (HOSPITAL_COMMUNITY)
Admission: EM | Admit: 2023-08-08 | Discharge: 2023-08-09 | Disposition: A | Discharge status: Home / Self Care | Attending: Emergency Medicine | Admitting: Emergency Medicine

## 2023-08-08 ENCOUNTER — Other Ambulatory Visit: Payer: Self-pay

## 2023-08-08 ENCOUNTER — Encounter (HOSPITAL_COMMUNITY): Payer: Self-pay

## 2023-08-08 DIAGNOSIS — I129 Hypertensive chronic kidney disease with stage 1 through stage 4 chronic kidney disease, or unspecified chronic kidney disease: Secondary | ICD-10-CM | POA: Diagnosis not present

## 2023-08-08 DIAGNOSIS — E119 Type 2 diabetes mellitus without complications: Secondary | ICD-10-CM | POA: Diagnosis not present

## 2023-08-08 DIAGNOSIS — G4489 Other headache syndrome: Secondary | ICD-10-CM | POA: Diagnosis not present

## 2023-08-08 DIAGNOSIS — N184 Chronic kidney disease, stage 4 (severe): Secondary | ICD-10-CM | POA: Diagnosis not present

## 2023-08-08 DIAGNOSIS — N3000 Acute cystitis without hematuria: Secondary | ICD-10-CM | POA: Diagnosis not present

## 2023-08-08 DIAGNOSIS — Z7901 Long term (current) use of anticoagulants: Secondary | ICD-10-CM | POA: Diagnosis not present

## 2023-08-08 DIAGNOSIS — J45909 Unspecified asthma, uncomplicated: Secondary | ICD-10-CM | POA: Insufficient documentation

## 2023-08-08 DIAGNOSIS — F0390 Unspecified dementia without behavioral disturbance: Secondary | ICD-10-CM | POA: Insufficient documentation

## 2023-08-08 DIAGNOSIS — R42 Dizziness and giddiness: Secondary | ICD-10-CM | POA: Diagnosis present

## 2023-08-08 DIAGNOSIS — Z794 Long term (current) use of insulin: Secondary | ICD-10-CM | POA: Diagnosis not present

## 2023-08-08 DIAGNOSIS — Z79899 Other long term (current) drug therapy: Secondary | ICD-10-CM | POA: Diagnosis not present

## 2023-08-08 MED ORDER — ACETAMINOPHEN 325 MG PO TABS
650.0000 mg | ORAL_TABLET | Freq: Once | ORAL | Status: DC
  Filled 2023-08-08: qty 2

## 2023-08-08 NOTE — ED Provider Triage Note (Signed)
 Emergency Medicine Provider Triage Evaluation Note  [name] [name] , a 89 y.o. female  was evaluated in triage.  Pt complains of headache supposedly.  Patient reports that she is unsure how she got to the ED.  She denies any complaints.  She apparently called out for headache but denies any headache to me.  Reassuring neurological examination.  Will collect labs, scan head, provide Tylenol .  Alert to baseline.  Review of Systems  Positive:  Negative:   Physical Exam  BP 111/61 (BP Location: Right Arm)   Pulse 63   Temp 97.8 F (36.6 C) (Oral)   Resp 18   Ht 5' (1.524 m)   Wt 87 kg   SpO2 100%   BMI 37.46 kg/m  Gen:   Awake, no distress   Resp:  Normal effort  MSK:   Moves extremities without difficulty  Other:    Medical Decision Making  Medically screening exam initiated at 11:31 PM.  Appropriate orders placed.  [name] [name] was informed that the remainder of the evaluation will be completed by another provider, this initial triage assessment does not replace that evaluation, and the importance of remaining in the ED until their evaluation is complete.     Adel Aden, PA-C 08/08/23 [phone]

## 2023-08-08 NOTE — ED Triage Notes (Signed)
 Pt BIB [facility] from home for headache x1 hour. H/x dementia 120/66 HR 60 Cbg 199

## 2023-08-09 ENCOUNTER — Emergency Department (HOSPITAL_COMMUNITY)

## 2023-08-09 LAB — URINALYSIS, ROUTINE W REFLEX MICROSCOPIC
Bilirubin Urine: NEGATIVE
Glucose, UA: NEGATIVE mg/dL
Ketones, ur: NEGATIVE mg/dL
Nitrite: NEGATIVE
Protein, ur: 100 mg/dL — AB
Specific Gravity, Urine: 1.015 (ref 1.005–1.030)
WBC, UA: >50 WBC/hpf (ref 0–5)
pH: 5 (ref 5.0–8.0)

## 2023-08-09 LAB — BASIC METABOLIC PANEL WITH GFR
Anion gap: 11 (ref 5–15)
BUN: 31 mg/dL — ABNORMAL HIGH (ref 8–23)
CO2: 21 mmol/L — ABNORMAL LOW (ref 22–32)
Calcium: 11.2 mg/dL — ABNORMAL HIGH (ref 8.9–10.3)
Chloride: 104 mmol/L (ref 98–111)
Creatinine, Ser: 2.48 mg/dL — ABNORMAL HIGH (ref 0.44–1.00)
GFR, Estimated: 18 mL/min — ABNORMAL LOW (ref >60)
Glucose, Bld: 180 mg/dL — ABNORMAL HIGH (ref 70–99)
Potassium: 4.2 mmol/L (ref 3.5–5.1)
Sodium: 136 mmol/L (ref 135–145)

## 2023-08-09 LAB — CBC
HCT: 38.8 % (ref 36.0–46.0)
Hemoglobin: 13 g/dL (ref 12.0–15.0)
MCH: 31.2 pg (ref 26.0–34.0)
MCHC: 33.5 g/dL (ref 30.0–36.0)
MCV: 93 fL (ref 80.0–100.0)
Platelets: 92 10*3/uL — ABNORMAL LOW (ref 150–400)
RBC: 4.17 MIL/uL (ref 3.87–5.11)
RDW: 14 % (ref 11.5–15.5)
WBC: 6 10*3/uL (ref 4.0–10.5)
nRBC: 0 % (ref 0.0–0.2)

## 2023-08-09 MED ORDER — FOSFOMYCIN TROMETHAMINE 3 G PO PACK
3.0000 g | PACK | Freq: Once | ORAL | Status: AC
  Administered 2023-08-09: 3 g via ORAL
  Filled 2023-08-09: qty 3

## 2023-08-09 NOTE — ED Provider Notes (Signed)
 [place] EMERGENCY DEPARTMENT AT [facility] Provider Note   CSN: [id number] Arrival date & time: 08/08/23  2137     History  Chief Complaint  Patient presents with   Headache   Level 5 caveat due to dementia [name] [name] is a 89 y.o. female.  The history is provided by the patient and a relative. The history is limited by the condition of the patient.  Patient w/history of dementia, hypertension, CKD presents for multiple complaints. Initially patient arrives reporting headache.  No falls or trauma.  No vomiting After family arrived, they report that they are concerned she has a UTI.  She has had increasing generalized weakness.  Her caregiver reported that when she stood up she was lightheaded and her blood pressure to 70s. No fevers or vomiting Family reports she frequently will report headache and this is not a new issue    Past Medical History:  Diagnosis Date   Allergic rhinitis, cause unspecified    Asthma    CKD (chronic kidney disease) stage 4, GFR 15-29 ml/min (HCC) 01/20/2021   Esophageal reflux    Sleep apnea    on C-pap- Dr young follows   Syncope 2019   Normal LVF, negative monitor   Type II or unspecified type diabetes mellitus without mention of complication, not stated as uncontrolled    Unspecified essential hypertension    mild LVH, normal LVF, grade 2 DD by echo 2019    Home Medications Prior to Admission medications   Medication Sig Start Date End Date Taking? Authorizing Provider  acetaminophen  (TYLENOL ) 500 MG tablet Take 1,000 mg by mouth in the morning and at bedtime.    [provider]  albuterol  (PROAIR  HFA) 108 (90 Base) MCG/ACT inhaler 2 puffs every 4-6 hours as directed- rescue Patient taking differently: Inhale 2 puffs into the lungs every 6 (six) hours as needed for wheezing or shortness of breath. 02/17/21   Medina-Vargas, Monina C, NP  carvedilol  (COREG ) 6.25 MG tablet Take 6.25 mg by mouth 2 (two) times daily  with a meal.    [provider]  divalproex  (DEPAKOTE ) 125 MG DR tablet Take 125 mg by mouth 2 (two) times daily.    [provider]  ELIQUIS  2.5 MG TABS tablet Take 1 tablet (2.5 mg total) by mouth 2 (two) times daily. 02/17/21   Medina-Vargas, Monina C, NP  escitalopram  (LEXAPRO ) 10 MG tablet Take 10 mg by mouth daily.    [provider]  furosemide  (LASIX ) 40 MG tablet Take 1 tablet (40 mg total) by mouth daily. 02/19/22   Deforest Fast, MD  hydrALAZINE  (APRESOLINE ) 50 MG tablet Take 1 tablet (50 mg total) by mouth 3 (three) times daily. Patient taking differently: Take 50 mg by mouth in the morning and at bedtime. 02/19/22   Deforest Fast, MD  isosorbide  mononitrate (IMDUR ) 30 MG 24 hr tablet Take 1 tablet (30 mg total) by mouth daily. 02/19/22   Deforest Fast, MD  L-Lysine  1000 MG TABS Take 1,000 mg by mouth daily.    [provider]  loratadine  (CLARITIN ) 10 MG tablet Take 10 mg by mouth daily as needed for allergies.    [provider]  omeprazole  (PRILOSEC  OTC) 20 MG tablet Take 20 mg by mouth daily before breakfast.    [provider]  simvastatin  (ZOCOR ) 40 MG tablet Take 1 tablet (40 mg total) by mouth every evening. Patient taking differently: Take 40 mg by mouth at bedtime. 02/17/21   Medina-Vargas,  Monina C, NP  TOUJEO  SOLOSTAR 300 UNIT/ML Solostar Pen Inject 20 Units into the skin at bedtime. 01/03/22   [provider]      Allergies    Aspirin, Codeine, Ibuprofen, Amoxicillin-pot clavulanate, Aricept [donepezil], Meperidine hcl, Namenda [memantine], and Tape    Review of Systems   Review of Systems  Unable to perform ROS: Dementia    Physical Exam Updated Vital Signs BP (!) 155/118   Pulse 71   Temp 97.7 F (36.5 C)   Resp 18   Ht 1.524 m (5')   Wt 87 kg   SpO2 99%   BMI 37.46 kg/m  Physical Exam CONSTITUTIONAL: Elderly, no acute distress HEAD: Normocephalic/atraumatic, no temporal  tenderness EYES: EOMI/PERRL, no corneal haziness, no conjunctival injection ENMT: Mucous membranes moist NECK: supple no meningeal signs CV: S1/S2 noted, no murmurs/rubs/gallops noted LUNGS: Lungs are clear to auscultation bilaterally, no apparent distress ABDOMEN: soft, nontender NEURO: Pt is awake/alert, moves all extremitiesx4.  No facial droop.  Patient is pleasantly confused, smiling and in no distress.  No arm or leg drift EXTREMITIES: pulses normal/equal, full ROM SKIN: warm, color normal  ED Results / Procedures / Treatments   Labs (all labs ordered are listed, but only abnormal results are displayed) Labs Reviewed  CBC - Abnormal; Notable for the following components:      Result Value   Platelets 92 (*)    All other components within normal limits  BASIC METABOLIC PANEL WITH GFR - Abnormal; Notable for the following components:   CO2 21 (*)    Glucose, Bld 180 (*)    BUN 31 (*)    Creatinine, Ser 2.48 (*)    Calcium 11.2 (*)    GFR, Estimated 18 (*)    All other components within normal limits  URINALYSIS, ROUTINE W REFLEX MICROSCOPIC - Abnormal; Notable for the following components:   Color, Urine AMBER (*)    APPearance CLOUDY (*)    Hgb urine dipstick SMALL (*)    Protein, ur 100 (*)    Leukocytes,Ua LARGE (*)    Bacteria, UA MANY (*)    All other components within normal limits  URINE CULTURE    EKG None  Radiology CT Head Wo Contrast Result Date: 08/09/2023 CLINICAL DATA:  Headache EXAM: CT HEAD WITHOUT CONTRAST TECHNIQUE: Contiguous axial images were obtained from the base of the skull through the vertex without intravenous contrast. RADIATION DOSE REDUCTION: This exam was performed according to the departmental dose-optimization program which includes automated exposure control, adjustment of the mA and/or kV according to patient size and/or use of iterative reconstruction technique. COMPARISON:  07/16/2022 FINDINGS: Brain: There is atrophy and chronic  small vessel disease changes. No acute intracranial abnormality. Specifically, no hemorrhage, hydrocephalus, mass lesion, acute infarction, or significant intracranial injury. Vascular: No hyperdense vessel or unexpected calcification. Skull: No acute calvarial abnormality. Sinuses/Orbits: No acute findings Other: None IMPRESSION: Atrophy, chronic microvascular disease. No acute intracranial abnormality. Electronically Signed   By: Janeece Mechanic M.D.   On: 08/09/2023 00:40    Procedures Procedures    Medications Ordered in ED Medications  acetaminophen  (TYLENOL ) tablet 650 mg (650 mg Oral Patient Refused/Not Given 08/08/23 2341)  fosfomycin (MONUROL ) packet 3 g (has no administration in time range)    ED Course/ Medical Decision Making/ A&P Clinical Course as of 08/09/23 6213  Fri Aug 09, 2023  0526 Patient history of dementia initially reported headache, but family reports now that his a chronic issue.  They were  concerned she had a UTI and had been follow-up with her PCP but has not been on antibiotics for it.  There is also report that she was hypotensive at home, but this has not been repeated here on orthostatics  Patient is overall well-appearing, smiling and in no acute distress.  Patient has baseline chronic kidney disease.  Urinalysis pending at this time [DW]  0613 Patient stable.  UTI noted.  Daughter reports has been several weeks since last treatment.  Will send culture.  Since patient is not septic, she is not ill-appearing and overall appropriate, will start with fosfomycin.  This is a one-time dose.  Daughter is agreeable with this plan.  Will follow-up with PCP if symptoms or not improving.  We discussed strict ER return precautions including fever, vomiting or altered mental status in the next 2 to 3 days [DW]    Clinical Course User Index [DW] Eldon Greenland, MD                                 Medical Decision Making Amount and/or Complexity of Data Reviewed Labs:  ordered.  Risk Prescription drug management.   This patient presents to the ED for concern of headache, this involves an extensive number of treatment options, and is a complaint that carries with it a high risk of complications and morbidity.  The differential diagnosis includes but is not limited to subarachnoid hemorrhage, intracranial hemorrhage, meningitis, encephalitis, CVST, temporal arteritis, idiopathic intracranial hypertension, migraine   Comorbidities that complicate the patient evaluation: Patient's presentation is complicated by their history of dementia  Social Determinants of Health: Patient's poor mobility and confusion  increases the complexity of managing their presentation  Additional history obtained: Additional history obtained from family Records reviewed previous admission documents  Lab Tests: I Ordered, and personally interpreted labs.  The pertinent results include: Kidney disease, chronic thrombocytopenia  Imaging Studies ordered: I ordered imaging studies including CT scan head  I independently visualized and interpreted imaging which showed no acute findings I agree with the radiologist interpretation   Medicines ordered and prescription drug management: I ordered medication including fosfomycin for UTI  Reevaluation: After the interventions noted above, I reevaluated the patient and found that they have :stayed the same  Complexity of problems addressed: Patient's presentation is most consistent with  acute presentation with potential threat to life or bodily function  Disposition: After consideration of the diagnostic results and the patient's response to treatment,  I feel that the patent would benefit from discharge  .           Final Clinical Impression(s) / ED Diagnoses Final diagnoses:  Acute cystitis without hematuria  Other headache syndrome    Rx / DC Orders ED Discharge Orders     None         Eldon Greenland,  MD 08/09/23 [phone]

## 2023-08-12 LAB — URINE CULTURE: Culture: 30000 — AB

## 2023-08-13 ENCOUNTER — Telehealth (HOSPITAL_BASED_OUTPATIENT_CLINIC_OR_DEPARTMENT_OTHER): Payer: Self-pay | Admitting: *Deleted

## 2023-08-13 NOTE — Telephone Encounter (Signed)
 Post ED Visit - Positive Culture Follow-up  Culture report reviewed by antimicrobial stewardship pharmacist: [facility] [x]  Barbra Boone, [place].D. []  Skeet Duke, Pharm.D., BCPS AQ-ID []  Leslee Rase, Pharm.D., BCPS []  Garland Junk, Pharm.D., BCPS []  [place], [address].D., BCPS, AAHIVP []  Alcide Aly, Pharm.D., BCPS, AAHIVP []  Jerri Morale, PharmD, BCPS []  Graham Laws, PharmD, BCPS []  Cleda Curly, PharmD, BCPS []  Tamar Fairly, PharmD []  Ballard Levels, PharmD, BCPS []  Ollen Beverage, PharmD  [facility] []  Arlyne Bering, PharmD []  Sherryle Don, PharmD []  Van Gelinas, PharmD []  Delila Felty, Rph []  [place]) Cleora Daft, PharmD []  Augustina Block, PharmD []  Arie Kurtz, PharmD []  Sharlyn Deaner, PharmD []  Agnes Hose, PharmD []  Kendall Pauls, PharmD []  Gladstone Lamer, PharmD []  Armanda Bern, PharmD []  Tera Fellows, PharmD   Positive urine culture Treated with fosfomycin, organism sensitive to the same and no further patient follow-up is required at this time.  Jessee Mormon 08/13/2023, 12:31 PM

## 2023-11-04 ENCOUNTER — Emergency Department (HOSPITAL_COMMUNITY)
Admission: EM | Admit: 2023-11-04 | Discharge: 2023-11-04 | Disposition: A | Discharge status: Home / Self Care | Attending: Emergency Medicine | Admitting: Emergency Medicine

## 2023-11-04 DIAGNOSIS — I5032 Chronic diastolic (congestive) heart failure: Secondary | ICD-10-CM | POA: Diagnosis not present

## 2023-11-04 DIAGNOSIS — W19XXXA Unspecified fall, initial encounter: Secondary | ICD-10-CM | POA: Diagnosis not present

## 2023-11-04 DIAGNOSIS — N3000 Acute cystitis without hematuria: Secondary | ICD-10-CM | POA: Diagnosis not present

## 2023-11-04 DIAGNOSIS — D72829 Elevated white blood cell count, unspecified: Secondary | ICD-10-CM | POA: Insufficient documentation

## 2023-11-04 DIAGNOSIS — F0390 Unspecified dementia without behavioral disturbance: Secondary | ICD-10-CM | POA: Insufficient documentation

## 2023-11-04 DIAGNOSIS — M545 Low back pain, unspecified: Secondary | ICD-10-CM | POA: Diagnosis present

## 2023-11-04 DIAGNOSIS — J449 Chronic obstructive pulmonary disease, unspecified: Secondary | ICD-10-CM | POA: Diagnosis not present

## 2023-11-04 DIAGNOSIS — Z7901 Long term (current) use of anticoagulants: Secondary | ICD-10-CM | POA: Diagnosis not present

## 2023-11-04 LAB — COMPREHENSIVE METABOLIC PANEL WITH GFR
ALT: 8 U/L (ref 0–44)
AST: 15 U/L (ref 15–41)
Albumin: 3.6 g/dL (ref 3.5–5.0)
Alkaline Phosphatase: 34 U/L — ABNORMAL LOW (ref 38–126)
Anion gap: 9 (ref 5–15)
BUN: 38 mg/dL — ABNORMAL HIGH (ref 8–23)
CO2: 23 mmol/L (ref 22–32)
Calcium: 10.4 mg/dL — ABNORMAL HIGH (ref 8.9–10.3)
Chloride: 108 mmol/L (ref 98–111)
Creatinine, Ser: 1.85 mg/dL — ABNORMAL HIGH (ref 0.44–1.00)
GFR, Estimated: 25 mL/min — ABNORMAL LOW (ref >60)
Glucose, Bld: 121 mg/dL — ABNORMAL HIGH (ref 70–99)
Potassium: 3.6 mmol/L (ref 3.5–5.1)
Sodium: 140 mmol/L (ref 135–145)
Total Bilirubin: 1.1 mg/dL (ref 0.0–1.2)
Total Protein: 6.7 g/dL (ref 6.5–8.1)

## 2023-11-04 LAB — URINALYSIS, W/ REFLEX TO CULTURE (INFECTION SUSPECTED)
Bilirubin Urine: NEGATIVE
Glucose, UA: NEGATIVE mg/dL
Hgb urine dipstick: NEGATIVE
Ketones, ur: NEGATIVE mg/dL
Nitrite: NEGATIVE
Protein, ur: 30 mg/dL — AB
Specific Gravity, Urine: 1.013 (ref 1.005–1.030)
pH: 5 (ref 5.0–8.0)

## 2023-11-04 LAB — CBC WITH DIFFERENTIAL/PLATELET
Abs Immature Granulocytes: 0.1 K/uL — ABNORMAL HIGH (ref 0.00–0.07)
Basophils Absolute: 0 K/uL (ref 0.0–0.1)
Basophils Relative: 1 %
Eosinophils Absolute: 0 K/uL (ref 0.0–0.5)
Eosinophils Relative: 0 %
HCT: 35.1 % — ABNORMAL LOW (ref 36.0–46.0)
Hemoglobin: 11.3 g/dL — ABNORMAL LOW (ref 12.0–15.0)
Immature Granulocytes: 3 %
Lymphocytes Relative: 36 %
Lymphs Abs: 1.2 K/uL (ref 0.7–4.0)
MCH: 31 pg (ref 26.0–34.0)
MCHC: 32.2 g/dL (ref 30.0–36.0)
MCV: 96.4 fL (ref 80.0–100.0)
Monocytes Absolute: 0.3 K/uL (ref 0.1–1.0)
Monocytes Relative: 8 %
Neutro Abs: 1.7 K/uL (ref 1.7–7.7)
Neutrophils Relative %: 52 %
Platelets: 74 K/uL — ABNORMAL LOW (ref 150–400)
RBC: 3.64 MIL/uL — ABNORMAL LOW (ref 3.87–5.11)
RDW: 14.2 % (ref 11.5–15.5)
WBC: 3.2 K/uL — ABNORMAL LOW (ref 4.0–10.5)
nRBC: 0 % (ref 0.0–0.2)

## 2023-11-04 MED ORDER — CIPROFLOXACIN HCL 500 MG PO TABS: 500.0000 mg | ORAL_TABLET | Freq: Two times a day (BID) | ORAL | 0 refills | Status: AC

## 2023-11-04 NOTE — ED Provider Notes (Signed)
 [place] EMERGENCY DEPARTMENT AT [facility] Provider Note   CSN: [id number] Arrival date & time: 11/04/23  1154     Patient presents with: [name] [name] [name] [name] is a 89 y.o. female who presents to the ED today with fall at home, as described this morning as she was getting off the toilet and was sat down firmly onto the ground, was there for about 30 minutes has some lower back pain, family had her brought to the ED secondary to history of back surgeries.  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  Further presents as confused, stating she does not remember why she is here but knows able to elaborate as to person, place, time, and event.  Review of her medical history does show COPD, chronic diastolic heart failure, previous episodes of UTI, and chronic dementia.    Fall       Prior to Admission medications   Medication Sig Start Date End Date Taking? Authorizing Provider  ciprofloxacin  (CIPRO ) 500 MG tablet Take 1 tablet (500 mg total) by mouth every 12 (twelve) hours. 11/04/23  Yes Myriam Dorn BROCKS, PA  acetaminophen  (TYLENOL ) 500 MG tablet Take 1,000 mg by mouth in the morning and at bedtime.    [provider]  albuterol  (PROAIR  HFA) 108 (90 Base) MCG/ACT inhaler 2 puffs every 4-6 hours as directed- rescue Patient taking differently: Inhale 2 puffs into the lungs every 6 (six) hours as needed for wheezing or shortness of breath. 02/17/21   Medina-Vargas, Monina C, NP  carvedilol  (COREG ) 6.25 MG tablet Take 6.25 mg by mouth 2 (two) times daily with a meal.    [provider]  divalproex  (DEPAKOTE ) 125 MG DR tablet Take 125 mg by mouth 2 (two) times daily.    [provider]  ELIQUIS  2.5 MG TABS tablet Take 1 tablet (2.5 mg total) by mouth 2 (two) times daily. 02/17/21   Medina-Vargas, Monina C, NP  escitalopram  (LEXAPRO ) 10 MG tablet Take 10 mg by mouth daily.    [provider]  furosemide  (LASIX ) 40 MG tablet  Take 1 tablet (40 mg total) by mouth daily. 02/19/22   Fairy Frames, MD  hydrALAZINE  (APRESOLINE ) 50 MG tablet Take 1 tablet (50 mg total) by mouth 3 (three) times daily. Patient taking differently: Take 50 mg by mouth in the morning and at bedtime. 02/19/22   Fairy Frames, MD  isosorbide  mononitrate (IMDUR ) 30 MG 24 hr tablet Take 1 tablet (30 mg total) by mouth daily. 02/19/22   Fairy Frames, MD  L-Lysine  1000 MG TABS Take 1,000 mg by mouth daily.    [provider]  loratadine  (CLARITIN ) 10 MG tablet Take 10 mg by mouth daily as needed for allergies.    [provider]  omeprazole  (PRILOSEC  OTC) 20 MG tablet Take 20 mg by mouth daily before breakfast.    [provider]  simvastatin  (ZOCOR ) 40 MG tablet Take 1 tablet (40 mg total) by mouth every evening. Patient taking differently: Take 40 mg by mouth at bedtime. 02/17/21   Medina-Vargas, Monina C, NP  TOUJEO  SOLOSTAR 300 UNIT/ML Solostar Pen Inject 20 Units into the skin at bedtime. 01/03/22   [provider]    Allergies: Aspirin, Codeine, Ibuprofen, Amoxicillin-pot clavulanate, Aricept [donepezil], Meperidine hcl, Namenda [memantine], and Tape    Review of Systems  Musculoskeletal:  Positive for back pain.  All other systems reviewed and are negative.   Updated Vital Signs BP (!) 190/101  Pulse 64   Temp 98 F (36.7 C) (Oral)   Resp 15   SpO2 96%   Physical Exam Vitals and nursing note reviewed.  Constitutional:      General: She is not in acute distress.    Appearance: Normal appearance.  HENT:     Head: Normocephalic and atraumatic.     Mouth/Throat:     Mouth: Mucous membranes are moist.     Pharynx: Oropharynx is clear.  Eyes:     Extraocular Movements: Extraocular movements intact.     Conjunctiva/sclera: Conjunctivae normal.     Pupils: Pupils are equal, round, and reactive to light.  Cardiovascular:     Rate and Rhythm: Normal rate and regular rhythm.     Pulses:  Normal pulses.     Heart sounds: Normal heart sounds. No murmur heard.    No friction rub. No gallop.  Pulmonary:     Effort: Pulmonary effort is normal.     Breath sounds: Normal breath sounds.  Abdominal:     General: Abdomen is flat. Bowel sounds are normal.     Palpations: Abdomen is soft.  Musculoskeletal:        General: Normal range of motion.     Cervical back: Normal, normal range of motion and neck supple.     Thoracic back: Normal.     Lumbar back: Tenderness present.     Right lower leg: No edema.     Left lower leg: No edema.     Comments: Paraspinal tenderness noted to the lumbar spine without any spinal deformities or step-offs appreciated.  No midline tenderness appreciated.  Skin:    General: Skin is warm and dry.     Capillary Refill: Capillary refill takes less than 2 seconds.  Neurological:     General: No focal deficit present.     Mental Status: She is alert. Mental status is at baseline.  Psychiatric:        Mood and Affect: Mood normal.     (all labs ordered are listed, but only abnormal results are displayed) Labs Reviewed  CBC WITH DIFFERENTIAL/PLATELET - Abnormal; Notable for the following components:      Result Value   WBC 3.2 (*)    RBC 3.64 (*)    Hemoglobin 11.3 (*)    HCT 35.1 (*)    Platelets 74 (*)    Abs Immature Granulocytes 0.10 (*)    All other components within normal limits  COMPREHENSIVE METABOLIC PANEL WITH GFR - Abnormal; Notable for the following components:   Glucose, Bld 121 (*)    BUN 38 (*)    Creatinine, Ser 1.85 (*)    Calcium 10.4 (*)    Alkaline Phosphatase 34 (*)    GFR, Estimated 25 (*)    All other components within normal limits  URINALYSIS, W/ REFLEX TO CULTURE (INFECTION SUSPECTED) - Abnormal; Notable for the following components:   APPearance HAZY (*)    Protein, ur 30 (*)    Leukocytes,Ua MODERATE (*)    Bacteria, UA RARE (*)    Non Squamous Epithelial 0-5 (*)    All other components within normal limits   URINE CULTURE    EKG: None  Radiology: No results found.   Procedures   Medications Ordered in the ED - No data to display  Medical Decision Making Amount and/or Complexity of Data Reviewed Labs: ordered.   Medical Decision Making:   YENTY BLOCH is a 89 y.o. female who presented to the ED today with a fall at home detailed above.     Complete initial physical exam performed, notably the patient  was alert and oriented in no apparent distress.  She is confused as to why she is here however is able to elaborate person, place, time, and event.    Reviewed and confirmed nursing documentation for past medical history, family history, social history.    Initial Assessment:   With the patient's presentation of possible fall at home, most likely diagnosis is deconditioning secondary to chronic medical conditions.  Consider possible differential of acute neurovascular insult, urinary tract infection, hypoglycemia, acute electrolyte abnormality.   Initial Plan:    Screening labs including CBC and Metabolic panel to evaluate for infectious or metabolic etiology of disease.  Urinalysis with reflex culture ordered to evaluate for UTI or relevant urologic/nephrologic pathology.  As this patient has a minimal mechanism, as well as reassuring clinical exam, will defer any imaging at this time. Objective evaluation as below reviewed as well as    Laboratory  All laboratory results reviewed without evidence of clinically relevant pathology.   Exceptions include: UA shows moderate leukocytes as well as proteinuria and bacteria.  CMP does show a elevated creatinine of 1.85 which is actually improved from previous.  Hemoglobin is 11.3 with a normal MCV which is stable for this patient.  Reassessment and Plan:   Based on clinical exam, it is reassuring that she does not have any serious findings, no spinal deformities, no bruising, reassuring neurologic  evaluation.  Lab evaluations also reassuring as there is no acute electrolyte abnormalities and her azotemia is improving.  UA does show that she has an acute UTI we will manage with outpatient course of antibiotics.  Expected follow-up with primary care in the next 1 to 2 weeks to follow her chronic medical conditions as well as her progression of UTI.       Final diagnoses:  Acute cystitis without hematuria  Fall, initial encounter    ED Discharge Orders          Ordered    ciprofloxacin  (CIPRO ) 500 MG tablet  Every 12 hours        11/04/23 1531               Myriam Dorn BROCKS, [place] 11/04/23 1533    Levander Houston, MD 11/07/23 [phone]

## 2023-11-04 NOTE — ED Notes (Addendum)
 Pt BP WNL with EMS, no hx of hypertension. BP on arrival repeated twice, 200 systolic both times with two different size cuffs

## 2023-11-04 NOTE — ED Triage Notes (Signed)
 Pt BIB EMS from home after a witnessed fall getting off the toilet, lowered onto the ground on butt. Down for 30 minutes so back pain. Pt has hx of back surgeries so family wanted her brought in. On Elliquis but no LOC or head injury.

## 2023-11-04 NOTE — ED Notes (Signed)
 Awaiting patient from lobby.

## 2023-11-04 NOTE — Discharge Instructions (Signed)
 On review of your lab work, it does appear that you have a urinary tract infection.  For this we will prescribe you a course of antibiotics to take at home.  I encourage you to follow-up with your primary care doctor regarding your chronic medical conditions and to monitor your recovery from your urinary tract infection.

## 2023-11-05 LAB — URINE CULTURE: Culture: NO GROWTH

## 2023-11-29 ENCOUNTER — Encounter (HOSPITAL_COMMUNITY): Payer: Self-pay

## 2023-11-29 ENCOUNTER — Emergency Department (HOSPITAL_COMMUNITY)

## 2023-11-29 ENCOUNTER — Emergency Department (HOSPITAL_COMMUNITY): Admission: EM | Admit: 2023-11-29 | Discharge: 2023-11-29 | Disposition: A | Discharge status: Home / Self Care

## 2023-11-29 ENCOUNTER — Other Ambulatory Visit: Payer: Self-pay

## 2023-11-29 DIAGNOSIS — G309 Alzheimer's disease, unspecified: Secondary | ICD-10-CM | POA: Insufficient documentation

## 2023-11-29 DIAGNOSIS — E1122 Type 2 diabetes mellitus with diabetic chronic kidney disease: Secondary | ICD-10-CM | POA: Diagnosis not present

## 2023-11-29 DIAGNOSIS — I509 Heart failure, unspecified: Secondary | ICD-10-CM | POA: Insufficient documentation

## 2023-11-29 DIAGNOSIS — I4891 Unspecified atrial fibrillation: Secondary | ICD-10-CM | POA: Diagnosis not present

## 2023-11-29 DIAGNOSIS — N189 Chronic kidney disease, unspecified: Secondary | ICD-10-CM | POA: Diagnosis not present

## 2023-11-29 DIAGNOSIS — I130 Hypertensive heart and chronic kidney disease with heart failure and stage 1 through stage 4 chronic kidney disease, or unspecified chronic kidney disease: Secondary | ICD-10-CM | POA: Diagnosis not present

## 2023-11-29 DIAGNOSIS — J449 Chronic obstructive pulmonary disease, unspecified: Secondary | ICD-10-CM | POA: Insufficient documentation

## 2023-11-29 DIAGNOSIS — F03918 Unspecified dementia, unspecified severity, with other behavioral disturbance: Secondary | ICD-10-CM

## 2023-11-29 DIAGNOSIS — F02818 Dementia in other diseases classified elsewhere, unspecified severity, with other behavioral disturbance: Secondary | ICD-10-CM | POA: Diagnosis not present

## 2023-11-29 DIAGNOSIS — Z7901 Long term (current) use of anticoagulants: Secondary | ICD-10-CM | POA: Diagnosis not present

## 2023-11-29 DIAGNOSIS — Z79899 Other long term (current) drug therapy: Secondary | ICD-10-CM | POA: Insufficient documentation

## 2023-11-29 DIAGNOSIS — R410 Disorientation, unspecified: Secondary | ICD-10-CM | POA: Insufficient documentation

## 2023-11-29 DIAGNOSIS — D696 Thrombocytopenia, unspecified: Secondary | ICD-10-CM | POA: Insufficient documentation

## 2023-11-29 DIAGNOSIS — R8271 Bacteriuria: Secondary | ICD-10-CM

## 2023-11-29 LAB — I-STAT CHEM 8, ED
BUN: 28 mg/dL — ABNORMAL HIGH (ref 8–23)
Calcium, Ion: 1.42 mmol/L — ABNORMAL HIGH (ref 1.15–1.40)
Chloride: 103 mmol/L (ref 98–111)
Creatinine, Ser: 2.1 mg/dL — ABNORMAL HIGH (ref 0.44–1.00)
Glucose, Bld: 97 mg/dL (ref 70–99)
HCT: 38 % (ref 36.0–46.0)
Hemoglobin: 12.9 g/dL (ref 12.0–15.0)
Potassium: 3.6 mmol/L (ref 3.5–5.1)
Sodium: 142 mmol/L (ref 135–145)
TCO2: 25 mmol/L (ref 22–32)

## 2023-11-29 LAB — URINALYSIS, ROUTINE W REFLEX MICROSCOPIC
Bilirubin Urine: NEGATIVE
Glucose, UA: NEGATIVE mg/dL
Hgb urine dipstick: NEGATIVE
Ketones, ur: NEGATIVE mg/dL
Nitrite: NEGATIVE
Protein, ur: 100 mg/dL — AB
Specific Gravity, Urine: 1.014 (ref 1.005–1.030)
pH: 6 (ref 5.0–8.0)

## 2023-11-29 LAB — COMPREHENSIVE METABOLIC PANEL WITH GFR
ALT: 11 U/L (ref 0–44)
AST: 19 U/L (ref 15–41)
Albumin: 4.2 g/dL (ref 3.5–5.0)
Alkaline Phosphatase: 41 U/L (ref 38–126)
Anion gap: 10 (ref 5–15)
BUN: 24 mg/dL — ABNORMAL HIGH (ref 8–23)
CO2: 25 mmol/L (ref 22–32)
Calcium: 10.9 mg/dL — ABNORMAL HIGH (ref 8.9–10.3)
Chloride: 103 mmol/L (ref 98–111)
Creatinine, Ser: 2.12 mg/dL — ABNORMAL HIGH (ref 0.44–1.00)
GFR, Estimated: 21 mL/min — ABNORMAL LOW (ref >60)
Glucose, Bld: 97 mg/dL (ref 70–99)
Potassium: 3.5 mmol/L (ref 3.5–5.1)
Sodium: 138 mmol/L (ref 135–145)
Total Bilirubin: 1.1 mg/dL (ref 0.0–1.2)
Total Protein: 7.1 g/dL (ref 6.5–8.1)

## 2023-11-29 LAB — CBC
HCT: 38 % (ref 36.0–46.0)
Hemoglobin: 12.6 g/dL (ref 12.0–15.0)
MCH: 30.9 pg (ref 26.0–34.0)
MCHC: 33.2 g/dL (ref 30.0–36.0)
MCV: 93.1 fL (ref 80.0–100.0)
Platelets: 91 K/uL — ABNORMAL LOW (ref 150–400)
RBC: 4.08 MIL/uL (ref 3.87–5.11)
RDW: 13.8 % (ref 11.5–15.5)
WBC: 3.3 K/uL — ABNORMAL LOW (ref 4.0–10.5)
nRBC: 0 % (ref 0.0–0.2)

## 2023-11-29 MED ORDER — SODIUM CHLORIDE 0.9 % IV BOLUS
500.0000 mL | Freq: Once | INTRAVENOUS | Status: AC
  Administered 2023-11-29: 500 mL via INTRAVENOUS

## 2023-11-29 MED ORDER — FOSFOMYCIN TROMETHAMINE 3 G PO PACK
3.0000 g | PACK | Freq: Once | ORAL | Status: AC
  Administered 2023-11-29: 3 g via ORAL
  Filled 2023-11-29: qty 3

## 2023-11-29 NOTE — Discharge Instructions (Signed)
 Your mother urine was treated with fosfomycin here.  Given the chronicity of her symptoms and review of her previous cultures I have concerned that this is more reflective of behavioral disturbance with her known diagnosis of Alzheimer's dementia.  Please reach out to her prescribing physician to discuss treatment for her behavioral changes.  Her urine has been sent for culture and if there are any discrepancies in the sensitivity report you will be notified for additional treatment.  As discussed at bedside if your mother is having difficulty swallowing it may be helpful to have your prescriber change her medication dosages to liquid or capsules that can be broken up onto applesauce so it is easier for her to swallow.  Get help if you have any new or worsening concerns.

## 2023-11-29 NOTE — ED Triage Notes (Signed)
 Pt BIB [facility] from home where she lives with daughter & has been treating a UTI with macrobid since the 11th. Daughter reports she is confused at baseline & when the UTI started she was more confused, then went back to normal. Has returned d/t the increased confusion being noticed again. Has had decreased intake, no n/v. EMS reports she was hypertensive at 250/120 but was 180/90 upon arrival to ED, 88 bpm, 96% on RA, CBG 104, 97.3 & warm to the touch.

## 2023-11-29 NOTE — ED Provider Notes (Signed)
 [facility] Provider Note   CSN: [id number] Arrival date & time: 11/29/23  1339     Patient presents with: UTI   [name] [name] is a 89 y.o. female with past medical history of obstructive sleep apnea, chronic kidney disease, diabetes, hypertension, frequent UTIs, A-fib on Eliquis  is presenting to emergency room with complaint plaint of confusion and possible recurrent UTI.  Apparently she has had intermittent UTI for approximately 5 months as well as some change in appetite.  She does live at home with family and will have transient episodes of not wanting to eat or get out of bed.  Today she knows where she is at her name but she is unsure of what year it is.  She does not have fever or chills at home.  No fall over the last few days.   HPI     Prior to Admission medications   Medication Sig Start Date End Date Taking? Authorizing Provider  acetaminophen  (TYLENOL ) 500 MG tablet Take 1,000 mg by mouth in the morning and at bedtime.    [provider]  albuterol  (PROAIR  HFA) 108 (90 Base) MCG/ACT inhaler 2 puffs every 4-6 hours as directed- rescue Patient taking differently: Inhale 2 puffs into the lungs every 6 (six) hours as needed for wheezing or shortness of breath. 02/17/21   Medina-Vargas, Monina C, NP  carvedilol  (COREG ) 6.25 MG tablet Take 6.25 mg by mouth 2 (two) times daily with a meal.    [provider]  ciprofloxacin  (CIPRO ) 500 MG tablet Take 1 tablet (500 mg total) by mouth every 12 (twelve) hours. 11/04/23   Myriam Dorn BROCKS, PA  divalproex  (DEPAKOTE ) 125 MG DR tablet Take 125 mg by mouth 2 (two) times daily.    [provider]  ELIQUIS  2.5 MG TABS tablet Take 1 tablet (2.5 mg total) by mouth 2 (two) times daily. 02/17/21   Medina-Vargas, Monina C, NP  escitalopram  (LEXAPRO ) 10 MG tablet Take 10 mg by mouth daily.    [provider]  furosemide  (LASIX ) 40 MG tablet Take 1 tablet (40 mg  total) by mouth daily. 02/19/22   Fairy Frames, MD  hydrALAZINE  (APRESOLINE ) 50 MG tablet Take 1 tablet (50 mg total) by mouth 3 (three) times daily. Patient taking differently: Take 50 mg by mouth in the morning and at bedtime. 02/19/22   Fairy Frames, MD  isosorbide  mononitrate (IMDUR ) 30 MG 24 hr tablet Take 1 tablet (30 mg total) by mouth daily. 02/19/22   Fairy Frames, MD  L-Lysine  1000 MG TABS Take 1,000 mg by mouth daily.    [provider]  loratadine  (CLARITIN ) 10 MG tablet Take 10 mg by mouth daily as needed for allergies.    [provider]  omeprazole  (PRILOSEC  OTC) 20 MG tablet Take 20 mg by mouth daily before breakfast.    [provider]  simvastatin  (ZOCOR ) 40 MG tablet Take 1 tablet (40 mg total) by mouth every evening. Patient taking differently: Take 40 mg by mouth at bedtime. 02/17/21   Medina-Vargas, Monina C, NP  TOUJEO  SOLOSTAR 300 UNIT/ML Solostar Pen Inject 20 Units into the skin at bedtime. 01/03/22   [provider]    Allergies: Aspirin, Codeine, Ibuprofen, Amoxicillin-pot clavulanate, Aricept [donepezil], Meperidine hcl, Namenda [memantine], and Tape    Review of Systems  Constitutional:  Positive for activity change.    Updated Vital Signs BP (!) 174/94   Pulse (!) 59   Temp 98 F (  36.7 C) (Oral)   Resp 18   Ht 5' (1.524 m)   Wt 71.1 kg   SpO2 100%   BMI 30.62 kg/m   Physical Exam Vitals and nursing note reviewed.  Constitutional:      General: She is not in acute distress.    Appearance: She is not toxic-appearing.  HENT:     Head: Normocephalic and atraumatic.  Eyes:     General: No scleral icterus.    Conjunctiva/sclera: Conjunctivae normal.  Cardiovascular:     Rate and Rhythm: Normal rate and regular rhythm.     Pulses: Normal pulses.     Heart sounds: Normal heart sounds.  Pulmonary:     Effort: Pulmonary effort is normal. No respiratory distress.     Breath sounds: Normal breath sounds.   Abdominal:     General: Abdomen is flat. Bowel sounds are normal.     Palpations: Abdomen is soft.     Tenderness: There is no abdominal tenderness.     Comments: Bruising over lower left abdomen.  Family reports this has been there since a fall 4 weeks ago.   Musculoskeletal:     Right lower leg: No edema.     Left lower leg: No edema.  Skin:    General: Skin is warm and dry.     Findings: No lesion.  Neurological:     General: No focal deficit present.     Mental Status: She is alert and oriented to person, place, and time. Mental status is at baseline.     (all labs ordered are listed, but only abnormal results are displayed) Labs Reviewed  COMPREHENSIVE METABOLIC PANEL WITH GFR - Abnormal; Notable for the following components:      Result Value   BUN 24 (*)    Creatinine, Ser 2.12 (*)    Calcium 10.9 (*)    GFR, Estimated 21 (*)    All other components within normal limits  CBC - Abnormal; Notable for the following components:   WBC 3.3 (*)    Platelets 91 (*)    All other components within normal limits  I-STAT CHEM 8, ED - Abnormal; Notable for the following components:   BUN 28 (*)    Creatinine, Ser 2.10 (*)    Calcium, Ion 1.42 (*)    All other components within normal limits  URINALYSIS, ROUTINE W REFLEX MICROSCOPIC    EKG: None  Radiology: CT Head Wo Contrast Result Date: 11/29/2023 EXAM: CT HEAD WITHOUT CONTRAST 11/29/2023 02:25:37 PM TECHNIQUE: CT of the head was performed without the administration of intravenous contrast. Automated exposure control, iterative reconstruction, and/or weight based adjustment of the mA/kV was utilized to reduce the radiation dose to as low as reasonably achievable. COMPARISON: 08/09/2023 CLINICAL HISTORY: Mental status change, unknown cause. FINDINGS: BRAIN AND VENTRICLES: No acute hemorrhage. No evidence of acute infarct. No hydrocephalus. No extra-axial collection. No mass effect or midline shift. Stable background of mild  chronic small vessel disease and generalized volume loss with an expected range for patient age. ORBITS: No acute abnormality. SINUSES: No acute abnormality. SOFT TISSUES AND SKULL: No acute soft tissue abnormality. No skull fracture. IMPRESSION: 1. No acute intracranial abnormality. Electronically signed by: Ryan Chess MD 11/29/2023 02:44 PM EDT RP Workstation: [id number]     Procedures   Medications Ordered in the ED  sodium chloride  0.9 % bolus 500 mL (has no administration in time range)    Clinical Course as of 11/29/23 1408  Fri Nov 29, 2023  1401 Spoke with [name] [name] (Daughter). Ongoing confusion and UTI for 4-5 months. She is reporting decreased appetite this past week although she is tolerating oral intake, seems to be spending more time in bed than normal. Notes last night she had stool incontinence which is atypical for her. Also some unsteady gait. Family is talking with palliative care. Lives with her daughter.  [JB]    Clinical Course User Index [JB] Casey Maxfield, Warren SAILOR, PA-C                                 Medical Decision Making Amount and/or Complexity of Data Reviewed Labs: ordered. Radiology: ordered.   This patient presents to the ED for concern of mental status, this involves an extensive number of treatment options, and is a complaint that carries with it a high risk of complications and morbidity.  The differential diagnosis includes intercranial hemorrhage, electrolyte abnormality, dehydration, TBI, ACS, UTI   Co morbidities that complicate the patient evaluation  COPD, congestive heart failure, frequent UTIs, A-fib on Eliquis    Additional history obtained:  Additional history obtained from 11/04/2023 patient was seen for acute cystitis and started on ciprofloxacin    Lab Tests:  I personally interpreted labs.  The pertinent results include:   WBC is 3.3 hemoglobin is 12.6 CMP shows creatinine of 2.12 and GFR of 21 which does seem about baseline (GFR 25, 3  weeks ago)  Awaiting UA   Imaging Studies ordered:  I ordered imaging studies including head CT I independently visualized and interpreted imaging which showed no acute findings I agree with the radiologist interpretation   Cardiac Monitoring: / EKG:  The patient was maintained on a cardiac monitor.  I personally viewed and interpreted the cardiac monitored which showed an underlying rhythm of: A-fib   Problem List / ED Course / Critical interventions / Medication management  Patient is presenting to the emergency room with concern of repeat UTI due to some confusion around the house.  Patient's family reports that she has been having some decreased appetite and worsening unsteadiness when up and walking around.  Patient lives at home with her daughter.  They are specifically concerned she has urinary tract infection as she has acted like this before when she had UTI.  She has not had any new injury or fall.  Her head CT shows no acute findings.  She has no significant anemia.  Kidney function appears at baseline. I ordered medication including 500 NS Reevaluation of the patient after these medicines showed that the patient stayed the same I have reviewed the patients home medicines and have made adjustments as needed. Signed out to [employer] at shift change.         Final diagnoses:  None    ED Discharge Orders     None          Shermon Warren SAILOR, PA-C 11/29/23 1538    Simon Lavonia SAILOR, MD 12/01/23 [phone]

## 2023-11-29 NOTE — ED Provider Notes (Signed)
 Patient has had intermittent, transient altered mental status.  Pcp was concerned for UTI.  Sometimes cannot figure out how to use her walker and last night she had an episode of bowel incontinence   Waiting for UA suspect worsening of her baseline dementia. Physical Exam  BP (!) 172/96   Pulse 69   Temp (!) 97.5 F (36.4 C) (Rectal)   Resp 16   Ht 5' (1.524 m)   Wt 71.1 kg   SpO2 100%   BMI 30.62 kg/m   Physical Exam  Procedures  Procedures  ED Course / MDM   Clinical Course as of 11/29/23 1816  Fri Nov 29, 2023  1401 Spoke with Olam (Daughter). Ongoing confusion and UTI for 4-5 months. She is reporting decreased appetite this past week although she is tolerating oral intake, seems to be spending more time in bed than normal. Notes last night she had stool incontinence which is atypical for her. Also some unsteady gait. Family is talking with palliative care. Lives with her daughter.  [JB]  1619 Creatinine(!): 2.12 Creatinine appears baseline [AH]  1619 Platelets(!): 91 Chronic thrombocytopenia [AH]  1619 ` [AH]    Clinical Course User Index [AH] Arloa Chroman, PA-C [JB] Barrett, Warren SAILOR, PA-C   Medical Decision Making Amount and/or Complexity of Data Reviewed Labs: ordered. Decision-making details documented in ED Course. Radiology: ordered.  Risk Prescription drug management.   Patient's family at bedside.  Apparently the patient's confusion yesterday was characterized by aggressive behavior, paranoia and delusion, calling the police on her family.  She has had waxing and waning symptoms for some time.  I have high suspicion that the patient's changes are due to behavioral disturbance with her history of Alzheimer's dementia.  I reviewed patient's lab results.  Her initial urine in May showed 30,000 colony-forming units of E. coli and greater than 100,000 units of lactobacillus.  Her last urine culture back November 04, 2023 showed no serial growth from culture.  I  have concerned that if we continue to treat likely her chronic urine colonization that we may create resistances and the patient is not specifically showing signs or symptoms including shaking chills fever or severe back pain bladder pain or urinary frequency.  Will treat today with a single dose of fosfomycin after review of previous culture reports with ED pharmacist Dorn Buttner.  Patient had a catheterized urine today which will be sent for culture.  At a very long discussion at bedside with the patient's family as it seems like her behavioral disturbances are their biggest grievance and they should touch base with her prescribing physician for further management of her behavioral disturbances and paranoia.  Patient is afebrile.  She has not taken her medications because she is frequently noncompliant and her blood pressure is elevated.  Her labs appear to be baseline including chronic thrombocytopenia and baseline renal insufficiency.  She appears otherwise appropriate for discharge at this time.       Arloa Chroman, PA-C 11/29/23 1820    Rogelia Jerilynn RAMAN, MD 12/03/23 [phone]

## 2023-12-02 LAB — URINE CULTURE: Culture: NO GROWTH

## 2024-03-28 ENCOUNTER — Emergency Department (HOSPITAL_COMMUNITY): Admission: EM | Admit: 2024-03-28 | Discharge: 2024-03-28 | Disposition: A | Discharge status: Home / Self Care

## 2024-03-28 ENCOUNTER — Other Ambulatory Visit: Payer: Self-pay

## 2024-03-28 ENCOUNTER — Emergency Department (HOSPITAL_COMMUNITY)

## 2024-03-28 DIAGNOSIS — N390 Urinary tract infection, site not specified: Secondary | ICD-10-CM | POA: Insufficient documentation

## 2024-03-28 DIAGNOSIS — G319 Degenerative disease of nervous system, unspecified: Secondary | ICD-10-CM | POA: Insufficient documentation

## 2024-03-28 DIAGNOSIS — R296 Repeated falls: Secondary | ICD-10-CM | POA: Diagnosis not present

## 2024-03-28 DIAGNOSIS — S0003XA Contusion of scalp, initial encounter: Secondary | ICD-10-CM | POA: Diagnosis present

## 2024-03-28 DIAGNOSIS — F0390 Unspecified dementia without behavioral disturbance: Secondary | ICD-10-CM | POA: Insufficient documentation

## 2024-03-28 DIAGNOSIS — A499 Bacterial infection, unspecified: Secondary | ICD-10-CM

## 2024-03-28 DIAGNOSIS — Z7901 Long term (current) use of anticoagulants: Secondary | ICD-10-CM | POA: Insufficient documentation

## 2024-03-28 DIAGNOSIS — I6523 Occlusion and stenosis of bilateral carotid arteries: Secondary | ICD-10-CM | POA: Diagnosis not present

## 2024-03-28 DIAGNOSIS — W19XXXA Unspecified fall, initial encounter: Secondary | ICD-10-CM | POA: Insufficient documentation

## 2024-03-28 LAB — I-STAT CHEM 8, ED
BUN: 45 mg/dL — ABNORMAL HIGH (ref 8–23)
Calcium, Ion: 1.25 mmol/L (ref 1.15–1.40)
Chloride: 106 mmol/L (ref 98–111)
Creatinine, Ser: 2.6 mg/dL — ABNORMAL HIGH (ref 0.44–1.00)
Glucose, Bld: 159 mg/dL — ABNORMAL HIGH (ref 70–99)
HCT: 39 % (ref 36.0–46.0)
Hemoglobin: 13.3 g/dL (ref 12.0–15.0)
Potassium: 3.7 mmol/L (ref 3.5–5.1)
Sodium: 141 mmol/L (ref 135–145)
TCO2: 21 mmol/L — ABNORMAL LOW (ref 22–32)

## 2024-03-28 LAB — URINALYSIS, ROUTINE W REFLEX MICROSCOPIC
Bilirubin Urine: NEGATIVE
Glucose, UA: NEGATIVE mg/dL
Ketones, ur: NEGATIVE mg/dL
Nitrite: NEGATIVE
Protein, ur: 100 mg/dL — AB
Specific Gravity, Urine: 1.018 (ref 1.005–1.030)
WBC, UA: >50 WBC/hpf (ref 0–5)
pH: 5 (ref 5.0–8.0)

## 2024-03-28 LAB — COMPREHENSIVE METABOLIC PANEL WITH GFR
ALT: 12 U/L (ref 0–44)
AST: 23 U/L (ref 15–41)
Albumin: 4 g/dL (ref 3.5–5.0)
Alkaline Phosphatase: 68 U/L (ref 38–126)
Anion gap: 13 (ref 5–15)
BUN: 43 mg/dL — ABNORMAL HIGH (ref 8–23)
CO2: 23 mmol/L (ref 22–32)
Calcium: 10.7 mg/dL — ABNORMAL HIGH (ref 8.9–10.3)
Chloride: 104 mmol/L (ref 98–111)
Creatinine, Ser: 2.48 mg/dL — ABNORMAL HIGH (ref 0.44–1.00)
GFR, Estimated: 18 mL/min — ABNORMAL LOW
Glucose, Bld: 163 mg/dL — ABNORMAL HIGH (ref 70–99)
Potassium: 3.8 mmol/L (ref 3.5–5.1)
Sodium: 139 mmol/L (ref 135–145)
Total Bilirubin: 0.7 mg/dL (ref 0.0–1.2)
Total Protein: 6.7 g/dL (ref 6.5–8.1)

## 2024-03-28 LAB — I-STAT CG4 LACTIC ACID, ED: Lactic Acid, Venous: 1.8 mmol/L (ref 0.5–1.9)

## 2024-03-28 LAB — CBC
HCT: 38.4 % (ref 36.0–46.0)
Hemoglobin: 13 g/dL (ref 12.0–15.0)
MCH: 31 pg (ref 26.0–34.0)
MCHC: 33.9 g/dL (ref 30.0–36.0)
MCV: 91.6 fL (ref 80.0–100.0)
Platelets: 85 K/uL — ABNORMAL LOW (ref 150–400)
RBC: 4.19 MIL/uL (ref 3.87–5.11)
RDW: 14 % (ref 11.5–15.5)
WBC: 6.1 K/uL (ref 4.0–10.5)
nRBC: 0 % (ref 0.0–0.2)

## 2024-03-28 LAB — PROTIME-INR
INR: 1.1 (ref 0.8–1.2)
Prothrombin Time: 14.9 s (ref 11.4–15.2)

## 2024-03-28 LAB — SAMPLE TO BLOOD BANK

## 2024-03-28 LAB — ETHANOL: Alcohol, Ethyl (B): <15 mg/dL

## 2024-03-28 MED ORDER — CEPHALEXIN 250 MG PO CAPS: 250.0000 mg | ORAL_CAPSULE | Freq: Two times a day (BID) | ORAL | 0 refills | Status: AC

## 2024-03-28 MED ORDER — HYDRALAZINE HCL 25 MG PO TABS
25.0000 mg | ORAL_TABLET | Freq: Once | ORAL | Status: AC
  Administered 2024-03-28: 25 mg via ORAL
  Filled 2024-03-28: qty 1

## 2024-03-28 MED ORDER — CEPHALEXIN 500 MG PO CAPS: 500.0000 mg | ORAL_CAPSULE | Freq: Two times a day (BID) | ORAL | 0 refills | Status: DC

## 2024-03-28 NOTE — Progress Notes (Signed)
 Orthopedic Tech Progress Note Patient Details:  [name] [name] 04-18-1929 [id number] LV2T FOT. Ortho not needed at this time. Patient ID: [name] [name], female   DOB: 11/21/1929, 89 y.o.   MRN: [id number]  Giovanni LITTIE Lukes 03/28/2024, 2:40 AM

## 2024-03-28 NOTE — Discharge Instructions (Signed)
"   I think it reasonable plan of action is to discuss with the hospice nurse tomorrow morning about her ongoing care.  They may have ideas about what you can do in terms of placement.  Will cover for UTI though I think my concern for this is pretty low.  Given patient's decreased ambulation, we think this is reasonable.  Patient has some significant chronic findings.  She had the stenosis of the internal carotid artery that we discussed.  As well as the degenerative changes of her cervical spine.  At this time, you have elected not to pursue imaging or further intervention of this at this time.  I think this is reasonable.   There is no evidence of acute fracture from the fall.  No evidence of acute bleed from the fall. "

## 2024-03-28 NOTE — ED Provider Notes (Signed)
 " [place] EMERGENCY DEPARTMENT AT [facility] Provider Note   CSN: [id number] Arrival date & time: 03/28/24  0206     Patient presents with: [name] [name] is a 89 y.o. female.    Fall Pertinent negatives include no chest pain, no abdominal pain and no shortness of breath.   Patient presents because of fall.  Unwitnessed.  On Eliquis .  Hematoma to the right occipital head.  Patient ANO x 1 at baseline.  History of dementia.  On hospice care.  History of fall yesterday.  Bruising to the right wrist/thumb.  Patient otherwise denies all complaints.    Per Daughter [name] [name]: patient has fell twice in the past 24 hours. Lives with daughter. She is on eliquis . Had UTI last week. Had gotten back to her baseline. On Thursday, she ate. Was acting fine. Fell yesterday morning. Hurt the wrist and hand on right  side. Got the new walker with rollaters. Used this to get from recliner to bathroom to bed. Was laying on the side when they found her after the fall today. Fractured left hip. On hospice since end of September. Dementia. Daughter does not want to be aggressive in terms of treatment.   Prior to Admission medications  Medication Sig Start Date End Date Taking? Authorizing Provider  cephALEXin  (KEFLEX ) 500 MG capsule Take 1 capsule (500 mg total) by mouth 2 (two) times daily for 7 days. 03/28/24 04/04/24 Yes Simon Lavonia SAILOR, MD  acetaminophen  (TYLENOL ) 500 MG tablet Take 1,000 mg by mouth in the morning and at bedtime.    [provider]  albuterol  (PROAIR  HFA) 108 (90 Base) MCG/ACT inhaler 2 puffs every 4-6 hours as directed- rescue Patient taking differently: Inhale 2 puffs into the lungs every 6 (six) hours as needed for wheezing or shortness of breath. 02/17/21   Medina-Vargas, Monina C, NP  carvedilol  (COREG ) 6.25 MG tablet Take 6.25 mg by mouth 2 (two) times daily with a meal.    [provider]  ciprofloxacin  (CIPRO ) 500 MG tablet Take 1 tablet (500 mg  total) by mouth every 12 (twelve) hours. 11/04/23   Myriam Dorn BROCKS, PA  divalproex  (DEPAKOTE ) 125 MG DR tablet Take 125 mg by mouth 2 (two) times daily.    [provider]  ELIQUIS  2.5 MG TABS tablet Take 1 tablet (2.5 mg total) by mouth 2 (two) times daily. 02/17/21   Medina-Vargas, Monina C, NP  escitalopram  (LEXAPRO ) 10 MG tablet Take 10 mg by mouth daily.    [provider]  furosemide  (LASIX ) 40 MG tablet Take 1 tablet (40 mg total) by mouth daily. 02/19/22   Fairy Frames, MD  hydrALAZINE  (APRESOLINE ) 50 MG tablet Take 1 tablet (50 mg total) by mouth 3 (three) times daily. Patient taking differently: Take 50 mg by mouth in the morning and at bedtime. 02/19/22   Fairy Frames, MD  isosorbide  mononitrate (IMDUR ) 30 MG 24 hr tablet Take 1 tablet (30 mg total) by mouth daily. 02/19/22   Fairy Frames, MD  L-Lysine  1000 MG TABS Take 1,000 mg by mouth daily.    [provider]  loratadine  (CLARITIN ) 10 MG tablet Take 10 mg by mouth daily as needed for allergies.    [provider]  omeprazole  (PRILOSEC  OTC) 20 MG tablet Take 20 mg by mouth daily before breakfast.    [provider]  simvastatin  (ZOCOR ) 40 MG tablet Take 1 tablet (40 mg total) by mouth every evening. Patient taking differently: Take  40 mg by mouth at bedtime. 02/17/21   Medina-Vargas, Monina C, NP  TOUJEO  SOLOSTAR 300 UNIT/ML Solostar Pen Inject 20 Units into the skin at bedtime. 01/03/22   [provider]    Allergies: Aspirin, Codeine, Ibuprofen, Amoxicillin-pot clavulanate, Aricept [donepezil], Meperidine hcl, Namenda [memantine], and Tape    Review of Systems  Constitutional:  Negative for chills and fever.  HENT:  Negative for ear pain and sore throat.   Eyes:  Negative for pain and visual disturbance.  Respiratory:  Negative for cough and shortness of breath.   Cardiovascular:  Negative for chest pain and palpitations.  Gastrointestinal:  Negative for  abdominal pain and vomiting.  Genitourinary:  Negative for dysuria and hematuria.  Musculoskeletal:  Negative for arthralgias and back pain.  Skin:  Negative for color change and rash.  Neurological:  Negative for seizures and syncope.  All other systems reviewed and are negative.   Updated Vital Signs BP (!) 160/78   Pulse 65   Temp 98.1 F (36.7 C) (Oral)   Resp 15   SpO2 95%   Physical Exam Vitals and nursing note reviewed.  Constitutional:      General: She is not in acute distress.    Appearance: She is well-developed.  HENT:     Head: Normocephalic and atraumatic.  Eyes:     Conjunctiva/sclera: Conjunctivae normal.  Cardiovascular:     Rate and Rhythm: Normal rate and regular rhythm.     Heart sounds: No murmur heard. Pulmonary:     Effort: Pulmonary effort is normal. No respiratory distress.     Breath sounds: Normal breath sounds.  Abdominal:     Palpations: Abdomen is soft.     Tenderness: There is no abdominal tenderness.  Musculoskeletal:        General: No swelling.     Cervical back: Neck supple.  Skin:    General: Skin is warm and dry.     Capillary Refill: Capillary refill takes less than 2 seconds.  Neurological:     Mental Status: She is alert.  Psychiatric:        Mood and Affect: Mood normal.     (all labs ordered are listed, but only abnormal results are displayed) Labs Reviewed  COMPREHENSIVE METABOLIC PANEL WITH GFR - Abnormal; Notable for the following components:      Result Value   Glucose, Bld 163 (*)    BUN 43 (*)    Creatinine, Ser 2.48 (*)    Calcium 10.7 (*)    GFR, Estimated 18 (*)    All other components within normal limits  CBC - Abnormal; Notable for the following components:   Platelets 85 (*)    All other components within normal limits  URINALYSIS, ROUTINE W REFLEX MICROSCOPIC - Abnormal; Notable for the following components:   APPearance CLOUDY (*)    Hgb urine dipstick SMALL (*)    Protein, ur 100 (*)     Leukocytes,Ua LARGE (*)    Bacteria, UA MANY (*)    All other components within normal limits  I-STAT CHEM 8, ED - Abnormal; Notable for the following components:   BUN 45 (*)    Creatinine, Ser 2.60 (*)    Glucose, Bld 159 (*)    TCO2 21 (*)    All other components within normal limits  ETHANOL  PROTIME-INR  I-STAT CG4 LACTIC ACID, ED  SAMPLE TO BLOOD BANK    EKG: None  Radiology: DG Wrist Complete Right Result Date: 03/28/2024  EXAM: 3 OR MORE VIEW(S) XRAY OF THE WRIST 03/28/2024 02:39:07 AM COMPARISON: None available. CLINICAL HISTORY: Recent fall with wrist pain. FINDINGS: BONES AND JOINTS: No acute fracture. No malalignment. Degenerative changes of the first digit carpometacarpal joint. Chondrocalcinosis of the TFCC. Mild osteopenia is noted. SOFT TISSUES: Vascular calcifications. IMPRESSION: 1. No acute fracture or dislocation. Electronically signed by: Oneil Devonshire MD 03/28/2024 03:03 AM EST RP Workstation: [id number]   DG Pelvis Portable Result Date: 03/28/2024 EXAM: 1 or 2 VIEW(S) XRAY OF THE PELVIS 03/28/2024 02:39:07 AM COMPARISON: 07/07/2017 CLINICAL HISTORY: Recent fall with pelvic pain. FINDINGS: BONES AND JOINTS: Old healed intertrochanteric fracture of the left hip with intramedullary rod and screw fixation noted. Partially visualized lumbar fusion. No malalignment. SOFT TISSUES: Unremarkable. IMPRESSION: 1. No acute pelvic fracture. 2. Old healed intertrochanteric fracture of the left hip with intramedullary rod and screw fixation. Electronically signed by: Oneil Devonshire MD 03/28/2024 03:02 AM EST RP Workstation: [id number] [email or web address] Chest Port 1 View Result Date: 03/28/2024 EXAM: 1 VIEW(S) XRAY OF THE CHEST 03/28/2024 02:39:07 AM COMPARISON: None available. CLINICAL HISTORY: Trauma Trauma Trauma Trauma Trauma FINDINGS: LUNGS AND PLEURA: Low lung volumes. No focal pulmonary opacity. No pleural effusion. No pneumothorax. HEART AND MEDIASTINUM: Surgical clips at gastroesophageal  junction and left upper quadrant. No acute abnormality of the cardiac and mediastinal silhouettes. BONES AND SOFT TISSUES: No acute osseous abnormality. IMPRESSION: 1. No acute process. Electronically signed by: Oneil Devonshire MD 03/28/2024 03:01 AM EST RP Workstation: [id number]   CT HEAD WO CONTRAST Result Date: 03/28/2024 EXAM: CT HEAD AND CERVICAL SPINE 03/28/2024 02:29:56 AM TECHNIQUE: CT of the head and cervical spine was performed without the administration of intravenous contrast. Multiplanar reformatted images are provided for review. Automated exposure control, iterative reconstruction, and/or weight based adjustment of the mA/kV was utilized to reduce the radiation dose to as low as reasonably achievable. COMPARISON: Head CT 11/29/2023, cervical spine CT 01/20/2021. CLINICAL HISTORY: Head trauma, moderate-severe. FINDINGS: CT HEAD BRAIN AND VENTRICLES: There is mild to moderate global atrophy of the brain with mild to moderate small vessel disease of the cerebral white matter and atrophic ventriculomegaly. There is a chronic left thalamic lacunar infarct. Dystrophic calcification again noted in the frontal Falx. Both carotid Siphons are heavily calcified. There is no hyperdense vessel. No acute intracranial hemorrhage. No mass effect or midline shift. No abnormal extra-axial fluid collection. No evidence of acute infarct. ORBITS: Evidence of prior lens replacements with otherwise negative orbits. SINUSES AND MASTOIDS: No acute abnormality. SOFT TISSUES AND SKULL: No acute skull fracture. No acute soft tissue abnormality. CT CERVICAL SPINE BONES AND ALIGNMENT: There is osteopenia without evidence of cervical spine fractures or destructive bone lesions. There is no traumatic malalignment. Minimal degenerative anterolisthesis is again noted at C2-C3 and C7-T1. There is bone-on-bone anterior atlantodental joint space loss with osteophytes. DEGENERATIVE CHANGES: The cervical discs are collapsed with  bidirectional endplate osteophytes at all levels except C2-C3 and C7-T1 which are again normal in height, and there is disc collapse and bidirectional endplate spurring at T1-T2 and T2-T3 as well. There are posterior disc osteophyte complexes and calcified disc extrusions contributing to spinal canal stenosis and mild cord compression at C4-C5, C5-C6, and C6-C7. There is multilevel facet joint and uncinate joint hypertrophy with multilevel acquired foraminal stenosis, most significant on the right at C3-C4 and C4-C5, bilaterally at C5-C6 and C6-C7. Both proximal cervical ICAs are heavily calcified, particularly on the left, with likely flow-limiting narrowing. SOFT TISSUES: No prevertebral soft tissue swelling.  No laryngeal mass. IMPRESSION: 1. No acute intracranial CT findings or depressed skull fractures. 2. No acute fracture or traumatic malalignment of the cervical spine. 3. Multilevel cervical spondylosis and disc collapse with spinal canal stenosis and mild cord compression at C4-5, C5-6, and C6-7 in part due to calcified disc extrusions , with multilevel foraminal stenosis. 4. Heavily calcified proximal cervical internal carotid arteries, left greater than right, with suspected flow-limiting stenosis, for which carotid ultrasound or CTA/MRA may be helpful. 5. Chronic atrophy and small vessel disease. Electronically signed by: Francis Quam MD 03/28/2024 03:00 AM EST RP Workstation: [id number]   CT CERVICAL SPINE WO CONTRAST Result Date: 03/28/2024 EXAM: CT HEAD AND CERVICAL SPINE 03/28/2024 02:29:56 AM TECHNIQUE: CT of the head and cervical spine was performed without the administration of intravenous contrast. Multiplanar reformatted images are provided for review. Automated exposure control, iterative reconstruction, and/or weight based adjustment of the mA/kV was utilized to reduce the radiation dose to as low as reasonably achievable. COMPARISON: Head CT 11/29/2023, cervical spine CT 01/20/2021.  CLINICAL HISTORY: Head trauma, moderate-severe. FINDINGS: CT HEAD BRAIN AND VENTRICLES: There is mild to moderate global atrophy of the brain with mild to moderate small vessel disease of the cerebral white matter and atrophic ventriculomegaly. There is a chronic left thalamic lacunar infarct. Dystrophic calcification again noted in the frontal Falx. Both carotid Siphons are heavily calcified. There is no hyperdense vessel. No acute intracranial hemorrhage. No mass effect or midline shift. No abnormal extra-axial fluid collection. No evidence of acute infarct. ORBITS: Evidence of prior lens replacements with otherwise negative orbits. SINUSES AND MASTOIDS: No acute abnormality. SOFT TISSUES AND SKULL: No acute skull fracture. No acute soft tissue abnormality. CT CERVICAL SPINE BONES AND ALIGNMENT: There is osteopenia without evidence of cervical spine fractures or destructive bone lesions. There is no traumatic malalignment. Minimal degenerative anterolisthesis is again noted at C2-C3 and C7-T1. There is bone-on-bone anterior atlantodental joint space loss with osteophytes. DEGENERATIVE CHANGES: The cervical discs are collapsed with bidirectional endplate osteophytes at all levels except C2-C3 and C7-T1 which are again normal in height, and there is disc collapse and bidirectional endplate spurring at T1-T2 and T2-T3 as well. There are posterior disc osteophyte complexes and calcified disc extrusions contributing to spinal canal stenosis and mild cord compression at C4-C5, C5-C6, and C6-C7. There is multilevel facet joint and uncinate joint hypertrophy with multilevel acquired foraminal stenosis, most significant on the right at C3-C4 and C4-C5, bilaterally at C5-C6 and C6-C7. Both proximal cervical ICAs are heavily calcified, particularly on the left, with likely flow-limiting narrowing. SOFT TISSUES: No prevertebral soft tissue swelling. No laryngeal mass. IMPRESSION: 1. No acute intracranial CT findings or  depressed skull fractures. 2. No acute fracture or traumatic malalignment of the cervical spine. 3. Multilevel cervical spondylosis and disc collapse with spinal canal stenosis and mild cord compression at C4-5, C5-6, and C6-7 in part due to calcified disc extrusions , with multilevel foraminal stenosis. 4. Heavily calcified proximal cervical internal carotid arteries, left greater than right, with suspected flow-limiting stenosis, for which carotid ultrasound or CTA/MRA may be helpful. 5. Chronic atrophy and small vessel disease. Electronically signed by: Francis Quam MD 03/28/2024 03:00 AM EST RP Workstation: [id number]     Procedures   Medications Ordered in the ED - No data to display  Medical Decision Making Amount and/or Complexity of Data Reviewed Labs: ordered. Radiology: ordered.  Risk Prescription drug management.     HPI:   Patient presents because of fall.  Unwitnessed.  On Eliquis .  Hematoma to the right occipital head.  Patient ANO x 1 at baseline.  History of dementia.  On hospice care.  History of fall yesterday.  Bruising to the right wrist/thumb.  Patient otherwise denies all complaints.  Per Daughter [name] [name]: patient has fell twice in the past 24 hours. Lives with daughter. She is on eliquis . Had UTI last week. Had gotten back to her baseline. On Thursday, she ate. Was acting fine. Fell yesterday morning. Hurt the wrist and hand on right  side. Got the new walker with rollaters. Used this to get from recliner to bathroom to bed. Was laying on the side when they found her after the fall today. Fractured left hip. On hospice since end of September. Dementia. Daughter does not want to be aggressive in terms of treatment.   MDM:   Upon examination, patient hemodynamically stable. A&O x 1.   Patient has a hematoma to the right occipital area of patient's head.  Bruising to the right wrist as well.  Obtain CT head and CT cervical spine to  rule an Kuneff subdural epidural or cervical fracture.  Obtain right wrist x-ray to rule out an Kuneff wrist fracture.  Otherwise, no signs of trauma to the chest or abdomen.  No concern for intrathoracic or intra-abdominal pathology.  No pain to palpation of the hips.  No pain to palpation of the femurs.   Reevaluation:   Upon reexamination, patient hemodynamically stable.  Remained stable.  No evidence of epidural or subdural on CT scan.  CT cervical scan showed significant degenerative changes.  No acute fracture was seen.  Shows presignificant internal carotid stenosis as well.  Discussed with the daughter who is power of attorney and at bedside.  Elects to not pursue further intervention or testing at this point in time.  She does not feel like this would improve patient's quality of life which I think is reasonable.   No acute fracture was seen on x-rays of the pelvis, or right hand.  No indication for splinting of the right hand at this time.  No lacerations were seen.  Patient has hospice coming to see her tomorrow.  Discussed with the family to discuss possible placement options for the patient with hospice given difficulty with ambulation and multiple falls here recently.  Questionable UTI on UA.  Very well could just be colonized.  But given patient's increase in falls I think it is probably reasonable to cover her with antibiotics.  Patient to be discharged.      I have independently interpreted the XR  and CT  images and agree with the radiologist finding   Social Determinant of Health: Lives with daughter    Disposition and Follow Up: hospice care.       Final diagnoses:  Fall, initial encounter  Contusion of scalp, initial encounter  UTI (urinary tract infection), bacterial    ED Discharge Orders          Ordered    cephALEXin  (KEFLEX ) 500 MG capsule  2 times daily        03/28/24 0439               Simon Lavonia SAILOR, MD 03/28/24 [phone]  "

## 2024-03-28 NOTE — ED Triage Notes (Signed)
 Pt BIB [facility] after an unwitnessed fall on way to bathroom. PT was found supine on floor with no indication of LOC and a hematoma on the R side of her head. Pt is on eliquis . Pt is A&O x1 at baseline per family. Pt had recent fall yesterday(1/16), but was not transported to hospital, family reports pt had R wrist swelling since the fall on 1/16.   EMS Vitals 69 HR 162/92 98% RA 169 CBG

## 2024-04-09 ENCOUNTER — Encounter (HOSPITAL_COMMUNITY): Payer: Self-pay

## 2024-04-09 ENCOUNTER — Emergency Department (HOSPITAL_COMMUNITY)
Admission: EM | Admit: 2024-04-09 | Discharge: 2024-04-10 | Disposition: A | Discharge status: Home / Self Care | Attending: Emergency Medicine | Admitting: Emergency Medicine

## 2024-04-09 ENCOUNTER — Emergency Department (HOSPITAL_COMMUNITY)

## 2024-04-09 ENCOUNTER — Other Ambulatory Visit: Payer: Self-pay

## 2024-04-09 DIAGNOSIS — Z79899 Other long term (current) drug therapy: Secondary | ICD-10-CM | POA: Insufficient documentation

## 2024-04-09 DIAGNOSIS — S51011A Laceration without foreign body of right elbow, initial encounter: Secondary | ICD-10-CM | POA: Diagnosis not present

## 2024-04-09 DIAGNOSIS — F0390 Unspecified dementia without behavioral disturbance: Secondary | ICD-10-CM | POA: Diagnosis not present

## 2024-04-09 DIAGNOSIS — I6389 Other cerebral infarction: Secondary | ICD-10-CM

## 2024-04-09 DIAGNOSIS — W19XXXA Unspecified fall, initial encounter: Secondary | ICD-10-CM | POA: Insufficient documentation

## 2024-04-09 DIAGNOSIS — N184 Chronic kidney disease, stage 4 (severe): Secondary | ICD-10-CM | POA: Diagnosis not present

## 2024-04-09 DIAGNOSIS — J4489 Other specified chronic obstructive pulmonary disease: Secondary | ICD-10-CM | POA: Diagnosis present

## 2024-04-09 DIAGNOSIS — S59901A Unspecified injury of right elbow, initial encounter: Secondary | ICD-10-CM | POA: Diagnosis present

## 2024-04-09 DIAGNOSIS — J45909 Unspecified asthma, uncomplicated: Secondary | ICD-10-CM | POA: Insufficient documentation

## 2024-04-09 DIAGNOSIS — G4733 Obstructive sleep apnea (adult) (pediatric): Secondary | ICD-10-CM | POA: Diagnosis present

## 2024-04-09 DIAGNOSIS — S0003XA Contusion of scalp, initial encounter: Secondary | ICD-10-CM | POA: Diagnosis not present

## 2024-04-09 DIAGNOSIS — E1122 Type 2 diabetes mellitus with diabetic chronic kidney disease: Secondary | ICD-10-CM | POA: Insufficient documentation

## 2024-04-09 DIAGNOSIS — I129 Hypertensive chronic kidney disease with stage 1 through stage 4 chronic kidney disease, or unspecified chronic kidney disease: Secondary | ICD-10-CM | POA: Diagnosis not present

## 2024-04-09 DIAGNOSIS — E1169 Type 2 diabetes mellitus with other specified complication: Secondary | ICD-10-CM | POA: Diagnosis present

## 2024-04-09 DIAGNOSIS — K220 Achalasia of cardia: Secondary | ICD-10-CM | POA: Diagnosis present

## 2024-04-09 DIAGNOSIS — I4891 Unspecified atrial fibrillation: Secondary | ICD-10-CM | POA: Insufficient documentation

## 2024-04-09 DIAGNOSIS — J449 Chronic obstructive pulmonary disease, unspecified: Secondary | ICD-10-CM | POA: Diagnosis present

## 2024-04-09 DIAGNOSIS — S40011A Contusion of right shoulder, initial encounter: Secondary | ICD-10-CM | POA: Insufficient documentation

## 2024-04-09 DIAGNOSIS — S5001XA Contusion of right elbow, initial encounter: Secondary | ICD-10-CM | POA: Diagnosis not present

## 2024-04-09 DIAGNOSIS — I639 Cerebral infarction, unspecified: Secondary | ICD-10-CM

## 2024-04-09 DIAGNOSIS — Z7901 Long term (current) use of anticoagulants: Secondary | ICD-10-CM | POA: Diagnosis not present

## 2024-04-09 DIAGNOSIS — I5032 Chronic diastolic (congestive) heart failure: Secondary | ICD-10-CM | POA: Diagnosis present

## 2024-04-09 DIAGNOSIS — Z86718 Personal history of other venous thrombosis and embolism: Secondary | ICD-10-CM

## 2024-04-09 DIAGNOSIS — K219 Gastro-esophageal reflux disease without esophagitis: Secondary | ICD-10-CM | POA: Diagnosis present

## 2024-04-09 DIAGNOSIS — G4730 Sleep apnea, unspecified: Secondary | ICD-10-CM | POA: Diagnosis present

## 2024-04-09 DIAGNOSIS — Z23 Encounter for immunization: Secondary | ICD-10-CM | POA: Diagnosis not present

## 2024-04-09 DIAGNOSIS — E785 Hyperlipidemia, unspecified: Secondary | ICD-10-CM | POA: Diagnosis present

## 2024-04-09 DIAGNOSIS — I10 Essential (primary) hypertension: Secondary | ICD-10-CM | POA: Diagnosis present

## 2024-04-09 DIAGNOSIS — Z515 Encounter for palliative care: Secondary | ICD-10-CM

## 2024-04-09 DIAGNOSIS — E669 Obesity, unspecified: Secondary | ICD-10-CM | POA: Diagnosis present

## 2024-04-09 DIAGNOSIS — Z7951 Long term (current) use of inhaled steroids: Secondary | ICD-10-CM | POA: Insufficient documentation

## 2024-04-09 LAB — COMPREHENSIVE METABOLIC PANEL WITH GFR
ALT: 6 U/L (ref 0–44)
AST: 18 U/L (ref 15–41)
Albumin: 3.8 g/dL (ref 3.5–5.0)
Alkaline Phosphatase: 77 U/L (ref 38–126)
Anion gap: 11 (ref 5–15)
BUN: 35 mg/dL — ABNORMAL HIGH (ref 8–23)
CO2: 19 mmol/L — ABNORMAL LOW (ref 22–32)
Calcium: 10.5 mg/dL — ABNORMAL HIGH (ref 8.9–10.3)
Chloride: 110 mmol/L (ref 98–111)
Creatinine, Ser: 1.94 mg/dL — ABNORMAL HIGH (ref 0.44–1.00)
GFR, Estimated: 24 mL/min — ABNORMAL LOW
Glucose, Bld: 193 mg/dL — ABNORMAL HIGH (ref 70–99)
Potassium: 3.8 mmol/L (ref 3.5–5.1)
Sodium: 140 mmol/L (ref 135–145)
Total Bilirubin: 0.8 mg/dL (ref 0.0–1.2)
Total Protein: 6.6 g/dL (ref 6.5–8.1)

## 2024-04-09 LAB — CBC WITH DIFFERENTIAL/PLATELET
Basophils Absolute: 0 10*3/uL (ref 0.0–0.1)
Basophils Relative: 1 %
Eosinophils Absolute: 0 10*3/uL (ref 0.0–0.5)
Eosinophils Relative: 0 %
HCT: 35.1 % — ABNORMAL LOW (ref 36.0–46.0)
Hemoglobin: 11.5 g/dL — ABNORMAL LOW (ref 12.0–15.0)
Lymphocytes Relative: 19 %
Lymphs Abs: 0.6 10*3/uL — ABNORMAL LOW (ref 0.7–4.0)
MCH: 30.6 pg (ref 26.0–34.0)
MCHC: 32.8 g/dL (ref 30.0–36.0)
MCV: 93.4 fL (ref 80.0–100.0)
Monocytes Absolute: 0.1 10*3/uL (ref 0.1–1.0)
Monocytes Relative: 2 %
Neutro Abs: 2.4 10*3/uL (ref 1.7–7.7)
Neutrophils Relative %: 78 %
Platelets: 78 10*3/uL — ABNORMAL LOW (ref 150–400)
RBC: 3.76 MIL/uL — ABNORMAL LOW (ref 3.87–5.11)
RDW: 14.5 % (ref 11.5–15.5)
Smear Review: NORMAL
WBC: 3.1 10*3/uL — ABNORMAL LOW (ref 4.0–10.5)
nRBC: 0 % (ref 0.0–0.2)

## 2024-04-09 LAB — CK: Total CK: 46 U/L (ref 38–234)

## 2024-04-09 MED ORDER — CARVEDILOL 3.125 MG PO TABS
6.2500 mg | ORAL_TABLET | ORAL | Status: AC
  Administered 2024-04-09: 6.25 mg via ORAL
  Filled 2024-04-09: qty 2

## 2024-04-09 MED ORDER — HYDRALAZINE HCL 50 MG PO TABS
50.0000 mg | ORAL_TABLET | Freq: Three times a day (TID) | ORAL | Status: DC
  Administered 2024-04-09 (×3): 50 mg via ORAL
  Filled 2024-04-09: qty 1
  Filled 2024-04-09 (×2): qty 2

## 2024-04-09 MED ORDER — TETANUS-DIPHTH-ACELL PERTUSSIS 5-2-15.5 LF-MCG/0.5 IM SUSP
0.5000 mL | Freq: Once | INTRAMUSCULAR | Status: AC
  Administered 2024-04-09: 0.5 mL via INTRAMUSCULAR
  Filled 2024-04-09: qty 0.5

## 2024-04-09 NOTE — ED Provider Notes (Signed)
 " [place] EMERGENCY DEPARTMENT AT [facility] Provider Note   CSN: [id number] Arrival date & time: 04/09/24  9191     Patient presents with: Fall and Hypertension   [name] [name] is a 89 y.o. female.   89 yo F with a chief complaints of a fall.  Patient does not remember anything about falling.  She does not even remember being on the ground.  She tells me a story about being on the ground on the driveway though EMS report patient fell inside the house.  Last seen by family about 6 hours ago.  Patient with a history of dementia.  Currently at her baseline.  Hypertensive with EMS.  Has not been compliant with her medications.   Fall  Hypertension       Prior to Admission medications  Medication Sig Start Date End Date Taking? Authorizing Provider  acetaminophen  (TYLENOL ) 500 MG tablet Take 1,000 mg by mouth in the morning and at bedtime.    [provider]  albuterol  (PROAIR  HFA) 108 (90 Base) MCG/ACT inhaler 2 puffs every 4-6 hours as directed- rescue Patient taking differently: Inhale 2 puffs into the lungs every 6 (six) hours as needed for wheezing or shortness of breath. 02/17/21   Medina-Vargas, Monina C, NP  carvedilol  (COREG ) 6.25 MG tablet Take 6.25 mg by mouth 2 (two) times daily with a meal.    [provider]  ciprofloxacin  (CIPRO ) 500 MG tablet Take 1 tablet (500 mg total) by mouth every 12 (twelve) hours. 11/04/23   Myriam Dorn BROCKS, PA  divalproex  (DEPAKOTE ) 125 MG DR tablet Take 125 mg by mouth 2 (two) times daily.    [provider]  ELIQUIS  2.5 MG TABS tablet Take 1 tablet (2.5 mg total) by mouth 2 (two) times daily. 02/17/21   Medina-Vargas, Monina C, NP  escitalopram  (LEXAPRO ) 10 MG tablet Take 10 mg by mouth daily.    [provider]  furosemide  (LASIX ) 40 MG tablet Take 1 tablet (40 mg total) by mouth daily. 02/19/22   Fairy Frames, MD  hydrALAZINE  (APRESOLINE ) 50 MG tablet Take 1 tablet (50 mg total) by  mouth 3 (three) times daily. Patient taking differently: Take 50 mg by mouth in the morning and at bedtime. 02/19/22   Fairy Frames, MD  isosorbide  mononitrate (IMDUR ) 30 MG 24 hr tablet Take 1 tablet (30 mg total) by mouth daily. 02/19/22   Fairy Frames, MD  L-Lysine  1000 MG TABS Take 1,000 mg by mouth daily.    [provider]  loratadine  (CLARITIN ) 10 MG tablet Take 10 mg by mouth daily as needed for allergies.    [provider]  omeprazole  (PRILOSEC  OTC) 20 MG tablet Take 20 mg by mouth daily before breakfast.    [provider]  simvastatin  (ZOCOR ) 40 MG tablet Take 1 tablet (40 mg total) by mouth every evening. Patient taking differently: Take 40 mg by mouth at bedtime. 02/17/21   Medina-Vargas, Monina C, NP  TOUJEO  SOLOSTAR 300 UNIT/ML Solostar Pen Inject 20 Units into the skin at bedtime. 01/03/22   [provider]    Allergies: Aspirin, Codeine, Ibuprofen, Amoxicillin-pot clavulanate, Aricept [donepezil], Meperidine hcl, Namenda [memantine], and Tape    Review of Systems  Updated Vital Signs BP (!) 207/76   Pulse 90   Temp 97.8 F (36.6 C) (Oral)   Resp 20   Ht 5' (1.524 m)   Wt 71.1 kg   SpO2 100%   BMI 30.61 kg/m  Physical Exam Vitals and nursing note reviewed.  Constitutional:      General: She is not in acute distress.    Appearance: She is well-developed. She is not diaphoretic.  HENT:     Head: Normocephalic.     Comments: Old appearing bruise to the right zygomatic arch. Eyes:     Pupils: Pupils are equal, round, and reactive to light.  Cardiovascular:     Rate and Rhythm: Normal rate and regular rhythm.     Heart sounds: No murmur heard.    No friction rub. No gallop.  Pulmonary:     Effort: Pulmonary effort is normal.     Breath sounds: No wheezing or rales.  Abdominal:     General: There is no distension.     Palpations: Abdomen is soft.     Tenderness: There is no abdominal tenderness.  Musculoskeletal:         General: No tenderness.     Cervical back: Normal range of motion and neck supple.     Comments: Skin tear overlying the right elbow.  Full range of motion able to supinate pronate without discomfort.  Some bruising to the right shoulder.  Full range of motion no obvious tenderness to palpation.    Skin:    General: Skin is warm and dry.  Neurological:     Mental Status: She is alert and oriented to person, place, and time.  Psychiatric:        Behavior: Behavior normal.     (all labs ordered are listed, but only abnormal results are displayed) Labs Reviewed  CBC WITH DIFFERENTIAL/PLATELET - Abnormal; Notable for the following components:      Result Value   WBC 3.1 (*)    RBC 3.76 (*)    Hemoglobin 11.5 (*)    HCT 35.1 (*)    Platelets 78 (*)    Lymphs Abs 0.6 (*)    All other components within normal limits  COMPREHENSIVE METABOLIC PANEL WITH GFR - Abnormal; Notable for the following components:   CO2 19 (*)    Glucose, Bld 193 (*)    BUN 35 (*)    Creatinine, Ser 1.94 (*)    Calcium 10.5 (*)    GFR, Estimated 24 (*)    All other components within normal limits  CK    EKG: EKG Interpretation Date/Time:  Thursday April 09 2024 08:18:57 EST Ventricular Rate:  75 PR Interval:    QRS Duration:  139 QT Interval:  422 QTC Calculation: 514 R Axis:   16  Text Interpretation: Atrial fibrillation Right bundle branch block Minimal ST elevation, lateral leads No significant change since Confirmed by Emil Share [phone]) on 04/09/2024 8:43:40 AM  Radiology: MR BRAIN WO CONTRAST Result Date: 04/09/2024 EXAM: MRI BRAIN WITHOUT CONTRAST 04/09/2024 12:09:54 PM TECHNIQUE: Multiplanar multisequence MRI of the head/brain was performed without the administration of intravenous contrast. COMPARISON: CT Head earlier today. MRI Head May 31, 2010. CLINICAL HISTORY: Neuro deficit, acute, stroke suspected FINDINGS: BRAIN AND VENTRICLES: Multiple acute infarcts in the left basal ganglia  with avid restricted diffusion. Acute or subacute infarct in the left thaalmus with fainter restricted diffusion. Punctate acute periventricular right occipital infarct. No intracranial hemorrhage. No mass. No midline shift. No hydrocephalus. The sella is unremarkable. Normal flow voids at the skull base. Mild to moderate for age scattered T2 hyperintensities are compatible with chronic microvascular ischemic change. Cerebral atrophy. ORBITS: No significant abnormality. SINUSES AND MASTOIDS: No significant abnormality. BONES AND SOFT TISSUES:  Normal marrow signal. No soft tissue abnormality. IMPRESSION: 1. Multiple acute infarcts in the left basal ganglia. 2. Acute or subacute infarct in the left thalamus. 3. Punctate acute periventricular right occipital infarct. Electronically signed by: Glendia Molt MD 04/09/2024 12:18 PM EST RP Workstation: [id number]   CT Head Wo Contrast Result Date: 04/09/2024 EXAM: CT HEAD WITHOUT CONTRAST 04/09/2024 09:34:00 AM TECHNIQUE: CT of the head was performed without the administration of intravenous contrast. Automated exposure control, iterative reconstruction, and/or weight based adjustment of the mA/kV was utilized to reduce the radiation dose to as low as reasonably achievable. COMPARISON: 03/28/2024 CLINICAL HISTORY: Head trauma, minor (Age >= 65 years). FINDINGS: BRAIN AND VENTRICLES: No acute hemorrhage. Focal hypoattenuation in the left basal ganglia involving the caudate head which appears new since the prior CT concerning for acute or subacute infarct recommend MRI for further evaluation. Remote infarct in the left thalamus. Global parenchymal volume loss. Mild chronic periventricular white matter changes. No hydrocephalus. No extra-axial collection. No mass effect or midline shift. ORBITS: No acute abnormality. SINUSES: No acute abnormality. SOFT TISSUES AND SKULL: No acute soft tissue abnormality. No skull fracture. IMPRESSION: 1. Focal hypoattenuation in the left  basal ganglia involving the caudate head, new since the prior CT, concerning for acute or subacute infarct. Recommend MRI for further evaluation. 2. Chronic changes as above. Electronically signed by: Donnice Mania MD 04/09/2024 10:07 AM EST RP Workstation: [id number]     .Critical Care  Performed by: Emil Share, DO Authorized by: Emil Share, DO   Critical care provider statement:    Critical care time (minutes):  35   Critical care time was exclusive of:  Separately billable procedures and treating other patients   Critical care was time spent personally by me on the following activities:  Development of treatment plan with patient or surrogate, discussions with consultants, evaluation of patient's response to treatment, examination of patient, ordering and review of laboratory studies, ordering and review of radiographic studies, ordering and performing treatments and interventions, pulse oximetry, re-evaluation of patient's condition and review of old charts   Care discussed with: admitting provider      Medications Ordered in the ED  hydrALAZINE  (APRESOLINE ) tablet 50 mg (50 mg Oral Given 04/09/24 1230)  Tdap (ADACEL ) injection 0.5 mL (0.5 mLs Intramuscular Given 04/09/24 1008)  carvedilol  (COREG ) tablet 6.25 mg (6.25 mg Oral Given 04/09/24 1231)                                    Medical Decision Making Amount and/or Complexity of Data Reviewed Labs: ordered. Radiology: ordered.  Risk Prescription drug management.   89 yo F with a chief complaints of a fall.  Patient unfortunately is demented and does not remember anything about the event.  Happened sometime at night.  Last seen about 6 hours ago by family in bed.  Patient denies any injury.  She does have some signs of trauma to the head though may be old.  Will obtain CT of the head.  Blood work.  Observed in the ED.  CT of the head concerning for possible subacute stroke.  MRI is consistent with multiple strokes.  On repeat  neurologic assessment she does have right upper extremity weakness compared to left.  I discussed the case with Dr. Lindzen, neurology will evaluate at bedside.  Will discuss with medicine for admission.  I did discuss the case with the patient's daughter.  She reiterated that the patient is currently in hospice.  After some discussion on the phone she would want the patient to be admitted.  Patient DNR/DNI.  Patient is hypertensive here.  Will start with patient's home medications and reassess.  Discussed this with hospitalist.  He rediscussed the case with the family.  Currently leaning towards inpatient hospice.  I will put in an order for transition of care.  The patients results and plan were reviewed and discussed.   Any x-rays performed were independently reviewed by myself.   Differential diagnosis were considered with the presenting HPI.  Medications  hydrALAZINE  (APRESOLINE ) tablet 50 mg (50 mg Oral Given 04/09/24 1230)  Tdap (ADACEL ) injection 0.5 mL (0.5 mLs Intramuscular Given 04/09/24 1008)  carvedilol  (COREG ) tablet 6.25 mg (6.25 mg Oral Given 04/09/24 1231)    Vitals:   04/09/24 1233 04/09/24 1257 04/09/24 1300 04/09/24 1400  BP:   (!) 207/81 (!) 207/76  Pulse:   90 90  Resp:   (!) 21 20  Temp: 97.8 F (36.6 C)     TempSrc: Oral     SpO2:  100% 100% 100%  Weight:      Height:        Final diagnoses:  Acute ischemic stroke [facility])        Final diagnoses:  Acute ischemic stroke [facility])    ED Discharge Orders     None          Emil Share, DO 04/09/24 1528  "

## 2024-04-09 NOTE — Progress Notes (Signed)
 Brief Progress/Phone Call Note  Consulted for admission for this patient after recurrent falls at home.  Found to have CT and then MRI evidence of acute and possible subacute CVA.  Consult has already been placed to neurology, but not yet seen.  Patient has history of dementia, CKD 4, CHF, COPD, obesity and other comorbidities and follows with hospice with [facility] the [facility] office).  EDP had already spoken with patient's daughter who had confirmed patient is followed by hospice and is DNR/DNI, during their conversation daughter seem to indicate that she would be okay with patient being admitted.  As part of considering patient for admission I did give patient's daughter a call to discuss patient's hospice status and goals as well as the wishes of the family for patient's workup and treatment.  Patient has not been seen/evaluated by hospitalist team/myself and we are not yet formally consulting, documentation here is for record of conversation that I had with patient's daughter.  Daughter confirmed that patient is with hospice and is DNR/DNI.  They would like to avoid aggressive workup.  Patient's daughter had recently spoke with their team at [facility] the [place].  [facility] [place] will be sending a liaison to evaluate patient for appropriateness to be transferred to hospice house.  Family's primary concern right now is that they are unable to continue caring for her in their home despite nurse and other help coming out to assist.  Do not need more aggressive treatment or workup.  We discussed options for next steps.  Ultimately, patient family agreed that it would be best to hold off on admission for now if patient can be evaluated by the hospice team and determine if she is appropriate to be transferred to hospice house to help coordinate that transfer as this will also provide her with the level of hospice care that they are unable to provide at home.  Patient will also be  seen by neurology and get their input as well while in the ED.  Discussed this conversation with the EDP and we will hold off on admission for now pending evaluation by her hospice team, neurology, and possibly TOC in the ED.  If no additional inpatient workup is needed, process will start for placement of patient in hospice house from the ED.

## 2024-04-09 NOTE — ED Notes (Signed)
 Patient incontinent of bowel, cleaned. In bed position of comfort.

## 2024-04-09 NOTE — ED Notes (Signed)
 Patient transported to CT

## 2024-04-09 NOTE — Discharge Instructions (Addendum)
It was a pleasure caring for you today in the emergency department. ? ?Please follow up with your pcp  ? ?Please return to the emergency department for any worsening or worrisome symptoms. ? ?

## 2024-04-09 NOTE — ED Notes (Signed)
Patient incontinent of urine. Cleaned and changed.

## 2024-04-09 NOTE — Consult Note (Signed)
 Stroke Neurology Consultation Note  Consult Requested by: Dr. Emil  Reason for Consult: stroke  Consult Date: 04/09/24   The history was obtained from the daughter and chart.  During history and examination, all items were able to obtain unless otherwise noted.  History of Present Illness:  [name] [name] is a 89 y.o. Caucasian female with PMH of HTN, HLD, DM, OSA, CKD, frequent UTI, afib on eliquis , thrombocytopenia, left hip fracture presented to ED for frequent falls at home. She was here on 1/17 after a fall with R scalp hematoma and R wrist swelling. This morning she was sent to ED again due to fall with bruise at R lateral orbital and R elblow and arm. CT showed acute vs. subacute left BG infarct and MRI showed left BG and R periventricular WM subacute infarct, but acute left caudate head infarcts. Neurology consulted.  Per daughter, she seems to have some R leg weakness for the last 2 weeks. She had chronic left leg mild weakness due to left hip fracture but R leg had some new difficulty to lift up for the last 2 weeks. She was not cooperative with medication at home and daughter has difficulty to get her taking her medications. She has dementia and now daughter felt that she can not take care of her anymore at home. She is already on outpt hospice care.   LSN: unclear TNK Given: No: outside window and stroke is complete IR Thrombectomy? No, mild stroke on MRI and high mRS Modified Rankin Scale: 3-4  Past Medical History:  Diagnosis Date   Acute cystitis 07/12/2023   Acute metabolic encephalopathy 02/18/2021   Allergic rhinitis, cause unspecified    Asthma    CKD (chronic kidney disease) stage 4, GFR 15-29 ml/min (HCC) 01/20/2021   Esophageal reflux    Sleep apnea    on C-pap- Dr young follows   Syncope 2019   Normal LVF, negative monitor   Type II or unspecified type diabetes mellitus without mention of complication, not stated as uncontrolled    Unspecified essential  hypertension    mild LVH, normal LVF, grade 2 DD by echo 2019   UTI (urinary tract infection) 02/18/2021    Past Surgical History:  Procedure Laterality Date   APPENDECTOMY     ESOPHAGEAL MANOMETRY N/A 07/11/2022   Procedure: ESOPHAGEAL MANOMETRY (EM);  Surgeon: Dianna Specking, MD;  Location: [facility];  Service: Gastroenterology;  Laterality: N/A;   ESOPHAGOGASTRODUODENOSCOPY N/A 07/14/2023   Procedure: EGD (ESOPHAGOGASTRODUODENOSCOPY);  Surgeon: Saintclair Jasper, MD;  Location: [facility];  Service: Gastroenterology;  Laterality: N/A;   ESOPHAGOGASTRODUODENOSCOPY (EGD) WITH PROPOFOL  N/A 01/29/2021   Procedure: ESOPHAGOGASTRODUODENOSCOPY (EGD) WITH PROPOFOL ;  Surgeon: Saintclair Jasper, MD;  Location: [facility]COPY;  Service: Gastroenterology;  Laterality: N/A;   ESOPHAGOGASTRODUODENOSCOPY (EGD) WITH PROPOFOL  N/A 04/27/2022   Procedure: ESOPHAGOGASTRODUODENOSCOPY (EGD) WITH PROPOFOL ;  Surgeon: Burnette Fallow, MD;  Location: [facility];  Service: Gastroenterology;  Laterality: N/A;   ESOPHAGOSCOPY W/ BOTOX  INJECTION  07/14/2023   Procedure: EGD, WITH BOTULINUM TOXIN INJECTION;  Surgeon: Saintclair Jasper, MD;  Location: [facility];  Service: Gastroenterology;;   FIXATION KYPHOPLASTY LUMBAR SPINE     FOREIGN BODY REMOVAL  01/29/2021   Procedure: FOREIGN BODY REMOVAL;  Surgeon: Saintclair Jasper, MD;  Location: [facility] ENDOSCOPY;  Service: Gastroenterology;;   FOREIGN BODY REMOVAL  04/27/2022   Procedure: FOREIGN BODY REMOVAL;  Surgeon: Burnette Fallow, MD;  Location: [facility];  Service: Gastroenterology;;   HIATAL HERNIA REPAIR     INTRAMEDULLARY (IM) NAIL  INTERTROCHANTERIC Left 01/21/2021   Procedure: INTRAMEDULLARY (IM) NAIL INTERTROCHANTRIC;  Surgeon: Jerri Kay HERO, MD;  Location: [facility] OR;  Service: Orthopedics;  Laterality: Left;   LUMBAR SPINE SURGERY     TUBAL LIGATION     VAGINAL HYSTERECTOMY      Family History  Problem Relation Age of Onset   Lung cancer Brother    Diabetes Mother     Social  History:  reports that she has never smoked. She has never used smokeless tobacco. She reports that she does not drink alcohol and does not use drugs.  Allergies: Allergies[1]  Medications Ordered Prior to Encounter[2]  Review of Systems: A full ROS was attempted today and was  able to be performed.  Systems assessed include - Constitutional, Eyes, HENT, Respiratory, Cardiovascular, Gastrointestinal, Genitourinary, Integument/breast, Hematologic/lymphatic, Musculoskeletal, Neurological, Behavioral/Psych, Endocrine, Allergic/Immunologic - with pertinent responses as per HPI.  Physical Examination: Temp:  [97.4 F (36.3 C)-97.8 F (36.6 C)] 97.8 F (36.6 C) (01/29 1233) Pulse Rate:  [74-90] 90 (01/29 1400) Resp:  [12-23] 20 (01/29 1400) BP: (207-262)/(76-114) 207/76 (01/29 1400) SpO2:  [99 %-100 %] 100 % (01/29 1400) Weight:  [71.1 kg] 71.1 kg (01/29 0817)  General - well nourished, well developed, in no apparent distress.    Ophthalmologic - fundi not visualized due to noncooperation.    Cardiovascular - irregularly irregular heart rate and rhythm  Neuro - awake, alert, eyes open, stated I try to figure out where I am now, orientated to self, DOB and people but not to place, time or situation. No aphasia, paucity of speech, following all simple commands. Able to name 2/4 and repeat simple sentences. Mild dysarthria. No gaze palsy, blinking to visual threat bilaterally, tracking bilaterally. No facial droop. Tongue midline. Bilateral UEs 4/5, no drift, R hand grip slightly weaker than the left. Bilaterally LEs 4/5, no drift, moving toes symmetrically. Sensation symmetrical bilaterally, L FTN intact, R FTN mild ataxia, gait not tested.    Data Reviewed: MR BRAIN WO CONTRAST Result Date: 04/09/2024 EXAM: MRI BRAIN WITHOUT CONTRAST 04/09/2024 12:09:54 PM TECHNIQUE: Multiplanar multisequence MRI of the head/brain was performed without the administration of intravenous contrast. COMPARISON:  CT Head earlier today. MRI Head May 31, 2010. CLINICAL HISTORY: Neuro deficit, acute, stroke suspected FINDINGS: BRAIN AND VENTRICLES: Multiple acute infarcts in the left basal ganglia with avid restricted diffusion. Acute or subacute infarct in the left thaalmus with fainter restricted diffusion. Punctate acute periventricular right occipital infarct. No intracranial hemorrhage. No mass. No midline shift. No hydrocephalus. The sella is unremarkable. Normal flow voids at the skull base. Mild to moderate for age scattered T2 hyperintensities are compatible with chronic microvascular ischemic change. Cerebral atrophy. ORBITS: No significant abnormality. SINUSES AND MASTOIDS: No significant abnormality. BONES AND SOFT TISSUES: Normal marrow signal. No soft tissue abnormality. IMPRESSION: 1. Multiple acute infarcts in the left basal ganglia. 2. Acute or subacute infarct in the left thalamus. 3. Punctate acute periventricular right occipital infarct. Electronically signed by: Glendia Molt MD 04/09/2024 12:18 PM EST RP Workstation: [id number]   CT Head Wo Contrast Result Date: 04/09/2024 EXAM: CT HEAD WITHOUT CONTRAST 04/09/2024 09:34:00 AM TECHNIQUE: CT of the head was performed without the administration of intravenous contrast. Automated exposure control, iterative reconstruction, and/or weight based adjustment of the mA/kV was utilized to reduce the radiation dose to as low as reasonably achievable. COMPARISON: 03/28/2024 CLINICAL HISTORY: Head trauma, minor (Age >= 65 years). FINDINGS: BRAIN AND VENTRICLES: No acute hemorrhage. Focal hypoattenuation in the left basal ganglia  involving the caudate head which appears new since the prior CT concerning for acute or subacute infarct recommend MRI for further evaluation. Remote infarct in the left thalamus. Global parenchymal volume loss. Mild chronic periventricular white matter changes. No hydrocephalus. No extra-axial collection. No mass effect or midline shift.  ORBITS: No acute abnormality. SINUSES: No acute abnormality. SOFT TISSUES AND SKULL: No acute soft tissue abnormality. No skull fracture. IMPRESSION: 1. Focal hypoattenuation in the left basal ganglia involving the caudate head, new since the prior CT, concerning for acute or subacute infarct. Recommend MRI for further evaluation. 2. Chronic changes as above. Electronically signed by: Donnice Mania MD 04/09/2024 10:07 AM EST RP Workstation: [id number]   DG Wrist Complete Right Result Date: 03/28/2024 EXAM: 3 OR MORE VIEW(S) XRAY OF THE WRIST 03/28/2024 02:39:07 AM COMPARISON: None available. CLINICAL HISTORY: Recent fall with wrist pain. FINDINGS: BONES AND JOINTS: No acute fracture. No malalignment. Degenerative changes of the first digit carpometacarpal joint. Chondrocalcinosis of the TFCC. Mild osteopenia is noted. SOFT TISSUES: Vascular calcifications. IMPRESSION: 1. No acute fracture or dislocation. Electronically signed by: Oneil Devonshire MD 03/28/2024 03:03 AM EST RP Workstation: [id number]   DG Pelvis Portable Result Date: 03/28/2024 EXAM: 1 or 2 VIEW(S) XRAY OF THE PELVIS 03/28/2024 02:39:07 AM COMPARISON: 07/07/2017 CLINICAL HISTORY: Recent fall with pelvic pain. FINDINGS: BONES AND JOINTS: Old healed intertrochanteric fracture of the left hip with intramedullary rod and screw fixation noted. Partially visualized lumbar fusion. No malalignment. SOFT TISSUES: Unremarkable. IMPRESSION: 1. No acute pelvic fracture. 2. Old healed intertrochanteric fracture of the left hip with intramedullary rod and screw fixation. Electronically signed by: Oneil Devonshire MD 03/28/2024 03:02 AM EST RP Workstation: [id number] [id number] Chest Port 1 View Result Date: 03/28/2024 EXAM: 1 VIEW(S) XRAY OF THE CHEST 03/28/2024 02:39:07 AM COMPARISON: None available. CLINICAL HISTORY: Trauma Trauma Trauma Trauma Trauma FINDINGS: LUNGS AND PLEURA: Low lung volumes. No focal pulmonary opacity. No pleural effusion. No pneumothorax. HEART  AND MEDIASTINUM: Surgical clips at gastroesophageal junction and left upper quadrant. No acute abnormality of the cardiac and mediastinal silhouettes. BONES AND SOFT TISSUES: No acute osseous abnormality. IMPRESSION: 1. No acute process. Electronically signed by: Oneil Devonshire MD 03/28/2024 03:01 AM EST RP Workstation: [id number]   CT HEAD WO CONTRAST Result Date: 03/28/2024 EXAM: CT HEAD AND CERVICAL SPINE 03/28/2024 02:29:56 AM TECHNIQUE: CT of the head and cervical spine was performed without the administration of intravenous contrast. Multiplanar reformatted images are provided for review. Automated exposure control, iterative reconstruction, and/or weight based adjustment of the mA/kV was utilized to reduce the radiation dose to as low as reasonably achievable. COMPARISON: Head CT 11/29/2023, cervical spine CT 01/20/2021. CLINICAL HISTORY: Head trauma, moderate-severe. FINDINGS: CT HEAD BRAIN AND VENTRICLES: There is mild to moderate global atrophy of the brain with mild to moderate small vessel disease of the cerebral white matter and atrophic ventriculomegaly. There is a chronic left thalamic lacunar infarct. Dystrophic calcification again noted in the frontal Falx. Both carotid Siphons are heavily calcified. There is no hyperdense vessel. No acute intracranial hemorrhage. No mass effect or midline shift. No abnormal extra-axial fluid collection. No evidence of acute infarct. ORBITS: Evidence of prior lens replacements with otherwise negative orbits. SINUSES AND MASTOIDS: No acute abnormality. SOFT TISSUES AND SKULL: No acute skull fracture. No acute soft tissue abnormality. CT CERVICAL SPINE BONES AND ALIGNMENT: There is osteopenia without evidence of cervical spine fractures or destructive bone lesions. There is no traumatic malalignment. Minimal degenerative anterolisthesis is again noted at C2-C3  and C7-T1. There is bone-on-bone anterior atlantodental joint space loss with osteophytes. DEGENERATIVE  CHANGES: The cervical discs are collapsed with bidirectional endplate osteophytes at all levels except C2-C3 and C7-T1 which are again normal in height, and there is disc collapse and bidirectional endplate spurring at T1-T2 and T2-T3 as well. There are posterior disc osteophyte complexes and calcified disc extrusions contributing to spinal canal stenosis and mild cord compression at C4-C5, C5-C6, and C6-C7. There is multilevel facet joint and uncinate joint hypertrophy with multilevel acquired foraminal stenosis, most significant on the right at C3-C4 and C4-C5, bilaterally at C5-C6 and C6-C7. Both proximal cervical ICAs are heavily calcified, particularly on the left, with likely flow-limiting narrowing. SOFT TISSUES: No prevertebral soft tissue swelling. No laryngeal mass. IMPRESSION: 1. No acute intracranial CT findings or depressed skull fractures. 2. No acute fracture or traumatic malalignment of the cervical spine. 3. Multilevel cervical spondylosis and disc collapse with spinal canal stenosis and mild cord compression at C4-5, C5-6, and C6-7 in part due to calcified disc extrusions , with multilevel foraminal stenosis. 4. Heavily calcified proximal cervical internal carotid arteries, left greater than right, with suspected flow-limiting stenosis, for which carotid ultrasound or CTA/MRA may be helpful. 5. Chronic atrophy and small vessel disease. Electronically signed by: Francis Quam MD 03/28/2024 03:00 AM EST RP Workstation: [id number]   CT CERVICAL SPINE WO CONTRAST Result Date: 03/28/2024 EXAM: CT HEAD AND CERVICAL SPINE 03/28/2024 02:29:56 AM TECHNIQUE: CT of the head and cervical spine was performed without the administration of intravenous contrast. Multiplanar reformatted images are provided for review. Automated exposure control, iterative reconstruction, and/or weight based adjustment of the mA/kV was utilized to reduce the radiation dose to as low as reasonably achievable. COMPARISON: Head CT  11/29/2023, cervical spine CT 01/20/2021. CLINICAL HISTORY: Head trauma, moderate-severe. FINDINGS: CT HEAD BRAIN AND VENTRICLES: There is mild to moderate global atrophy of the brain with mild to moderate small vessel disease of the cerebral white matter and atrophic ventriculomegaly. There is a chronic left thalamic lacunar infarct. Dystrophic calcification again noted in the frontal Falx. Both carotid Siphons are heavily calcified. There is no hyperdense vessel. No acute intracranial hemorrhage. No mass effect or midline shift. No abnormal extra-axial fluid collection. No evidence of acute infarct. ORBITS: Evidence of prior lens replacements with otherwise negative orbits. SINUSES AND MASTOIDS: No acute abnormality. SOFT TISSUES AND SKULL: No acute skull fracture. No acute soft tissue abnormality. CT CERVICAL SPINE BONES AND ALIGNMENT: There is osteopenia without evidence of cervical spine fractures or destructive bone lesions. There is no traumatic malalignment. Minimal degenerative anterolisthesis is again noted at C2-C3 and C7-T1. There is bone-on-bone anterior atlantodental joint space loss with osteophytes. DEGENERATIVE CHANGES: The cervical discs are collapsed with bidirectional endplate osteophytes at all levels except C2-C3 and C7-T1 which are again normal in height, and there is disc collapse and bidirectional endplate spurring at T1-T2 and T2-T3 as well. There are posterior disc osteophyte complexes and calcified disc extrusions contributing to spinal canal stenosis and mild cord compression at C4-C5, C5-C6, and C6-C7. There is multilevel facet joint and uncinate joint hypertrophy with multilevel acquired foraminal stenosis, most significant on the right at C3-C4 and C4-C5, bilaterally at C5-C6 and C6-C7. Both proximal cervical ICAs are heavily calcified, particularly on the left, with likely flow-limiting narrowing. SOFT TISSUES: No prevertebral soft tissue swelling. No laryngeal mass. IMPRESSION: 1.  No acute intracranial CT findings or depressed skull fractures. 2. No acute fracture or traumatic malalignment of the cervical spine. 3.  Multilevel cervical spondylosis and disc collapse with spinal canal stenosis and mild cord compression at C4-5, C5-6, and C6-7 in part due to calcified disc extrusions , with multilevel foraminal stenosis. 4. Heavily calcified proximal cervical internal carotid arteries, left greater than right, with suspected flow-limiting stenosis, for which carotid ultrasound or CTA/MRA may be helpful. 5. Chronic atrophy and small vessel disease. Electronically signed by: Francis Quam MD 03/28/2024 03:00 AM EST RP Workstation: [id number]    Assessment: 89 y.o. female with PMH of HTN, HLD, DM, OSA, CKD, frequent UTI, afib on eliquis , thrombocytopenia, left hip fracture presented to ED for frequent falls at home. CT showed acute vs. subacute left BG infarct and MRI showed left BG and R periventricular WM subacute infarct, but acute left caudate head infarcts. Daughter admitted that it is difficulty to get her taking medications at home. It is quite possible that pt stroke could be from noncompliance from eliquis . If aggressive care, we can do MRA, CUS, echo, LDL, A1c etc but will not change her management (not surgical candidate, not antiplatelet candidate given thrombocytopenia and already on anticoagulation). I had long discussion with Daughter [name] [name] over the phone, she declined further testing given no change of management. will recommend to continue home eliquis  and zocor  for now and waiting for hospice consult.   Plan: - no further stroke work up planned at this time - continue home eliquis  and zocor   - pending hospice consult - discussed with daughter [name] [name] - discussed with EDP Dr. Emil. - Neurology will sign off. Please call with questions.  Thank you for this consultation and allowing us  to participate in the care of this patient.   Ary Cummins, MD PhD Stroke  Neurology 04/09/2024 2:52 PM       [1]  Allergies Allergen Reactions   Aspirin Nausea And Vomiting and Nausea Only   Codeine Nausea And Vomiting and Nausea Only   Ibuprofen Nausea And Vomiting   Amoxicillin-Pot Clavulanate Other (See Comments) and Nausea Only    GI upset   Aricept [Donepezil] Other (See Comments)    Gi upset    Meperidine Hcl Nausea And Vomiting   Namenda [Memantine] Other (See Comments)    Gi upset    Tape Other (See Comments)    ADHESIVE TAPE CAUSES BLISTERS  [2]  No current facility-administered medications on file prior to encounter.   Current Outpatient Medications on File Prior to Encounter  Medication Sig Dispense Refill   acetaminophen  (TYLENOL ) 500 MG tablet Take 1,000 mg by mouth in the morning and at bedtime.     albuterol  (PROAIR  HFA) 108 (90 Base) MCG/ACT inhaler 2 puffs every 4-6 hours as directed- rescue (Patient taking differently: Inhale 2 puffs into the lungs every 6 (six) hours as needed for wheezing or shortness of breath.) 1 each prn   carvedilol  (COREG ) 6.25 MG tablet Take 6.25 mg by mouth 2 (two) times daily with a meal.     ciprofloxacin  (CIPRO ) 500 MG tablet Take 1 tablet (500 mg total) by mouth every 12 (twelve) hours. 10 tablet 0   divalproex  (DEPAKOTE ) 125 MG DR tablet Take 125 mg by mouth 2 (two) times daily.     ELIQUIS  2.5 MG TABS tablet Take 1 tablet (2.5 mg total) by mouth 2 (two) times daily. 60 tablet 0   escitalopram  (LEXAPRO ) 10 MG tablet Take 10 mg by mouth daily.     furosemide  (LASIX ) 40 MG tablet Take 1 tablet (40 mg total) by mouth daily. 30 tablet 0  hydrALAZINE  (APRESOLINE ) 50 MG tablet Take 1 tablet (50 mg total) by mouth 3 (three) times daily. (Patient taking differently: Take 50 mg by mouth in the morning and at bedtime.) 90 tablet 0   isosorbide  mononitrate (IMDUR ) 30 MG 24 hr tablet Take 1 tablet (30 mg total) by mouth daily. 30 tablet 0   L-Lysine  1000 MG TABS Take 1,000 mg by mouth daily.     loratadine   (CLARITIN ) 10 MG tablet Take 10 mg by mouth daily as needed for allergies.     omeprazole  (PRILOSEC  OTC) 20 MG tablet Take 20 mg by mouth daily before breakfast.     simvastatin  (ZOCOR ) 40 MG tablet Take 1 tablet (40 mg total) by mouth every evening. (Patient taking differently: Take 40 mg by mouth at bedtime.) 30 tablet 0   TOUJEO  SOLOSTAR 300 UNIT/ML Solostar Pen Inject 20 Units into the skin at bedtime.

## 2024-04-09 NOTE — ED Notes (Signed)
 Patient in bed in position of comfort. Oral medication taken without difficulty. Family at bedside.

## 2024-04-09 NOTE — ED Triage Notes (Signed)
 Pt from home with EMS. Pt found on floor by daughter, states she last saw her asleep around 2am. 248/102 with hx of HTN, did not take her BP meds yesterday or today. Pt is on Hospice care tues and thurs. Pt takes eliquis , no loc or head inj. Pt has skin tear on right elbow and forearm.  CBG 201 Recent UTI Hx of dementia

## 2024-04-09 NOTE — ED Notes (Signed)
 Placed Purewick for pt at the request of family

## 2024-04-09 NOTE — ED Provider Notes (Signed)
" °  Provider Note MRN:  [id number]  Arrival date & time: 04/09/24    ED Course and Medical Decision Making  Assumed care from Dr Emil at shift change.  See note from prior team for complete details, in brief:  Clinical Course as of 04/09/24 1839  Thu Apr 09, 2024  1542 Handoff DF 89 yo/f She has home hospice, unable to care for at home Stroke noted on MRI today Family wants inpatient hospice  SW working on it [SG]  1759 Spoke w/ SW, pt does not meet criteria for inpatient hospice/hospice house [SG]  1821 Family prefers to take patient home.  Hospice will send more help to the house tomorrow.  Strict return precautions provided [SG]    Clinical Course User Index [SG] Elnor Jayson LABOR, DO     Discharge in care of family, home hospice.   Procedures  Final Clinical Impressions(s) / ED Diagnoses     ICD-10-CM   1. Acute ischemic stroke (HCC)  I63.9     2. Hospice care patient  Z51.5       ED Discharge Orders     None         Discharge Instructions      It was a pleasure caring for you today in the emergency department.  Please return to the emergency department for any worsening or worrisome symptoms.          Elnor Jayson LABOR, DO 04/09/24 1839  "

## 2024-04-09 NOTE — Progress Notes (Addendum)
 TOC consult received for placement/LOC navigation. [name] [name] is a 89 yo f w/ a hx of dementia, on hospice at home [facility] [employer] office) where she lives with her daughter. Presents to the ED for evaluation s/p fall at home.   CSW began shift and noted consult. Per chart, awaiting [facility] eval for potential admission into their IPU. CSW reached out to office, spoke with Jenkins Jansky, was provided # for [facility] hospital liaison Magdalena Berber [phone], on amion [phone]). VM's left at both numbers. Also sent secure chat. Await return call. At this time, it appears eval is still pending.   CSW met with patient's daughter [name] [name] at bedside. Offered support, informed of availability and encouraged her to reach out if she needs anything. She was just arriving is not sure if [facility] had stopped by yet. TOC following.   4:13 - Spoke with Cheri, requested evaluation for IPU. Stated she would review clinical and send to her attending to review as well. Stated she would call when decision is made. Will update family.   6:30 - Patient ultimately did not meet criteria for inpatient hospice. Long discussion held with family at bedside. Reviewed options, including discharging home with resumption of hospice and pursuing admission with the ultimate goal of placement at a facility. They would like to bring the patient home at this time. Feel the patient would prefer to be at home, would be happier, and may do better. They do have family support. Per Magdalena [facility] will be able to send both a child psychotherapist and RN to the house tomorrow to support transition and reeval. [facility] also has the ability to place the patient from the community, including in respite, and this was discussed with family. They are without further questions and in agreement with discharge plan. Nursing and attending both updated. Nursing will call for PTAR.

## 2024-04-09 NOTE — ED Notes (Signed)
 Dr. Emil aware of hypertension. Anit-hypertensive medication given.

## 2024-04-09 NOTE — ED Notes (Signed)
 Patient transported to MRI

## 2024-04-09 NOTE — ED Notes (Signed)
 Small area of bruising corner of right eye approximately size of a dime. Skin tear to right elbow / forearm.

## 2024-04-09 NOTE — ED Notes (Signed)
 Notified Dr. Emil of continued hypertension, no new medications ordered at this time.

## 2024-04-09 NOTE — ED Notes (Signed)
 Spoke to Paris Daughter via telephone on transport home.

## 2024-04-10 NOTE — ED Notes (Signed)
 [facility] arrived and report given at bedside
